# Patient Record
Sex: Male | Born: 1935 | Race: Black or African American | Hispanic: No | Smoking: Never smoker
Health system: Southern US, Community
[De-identification: ages and names within clinical notes are randomized; demographics above are authoritative.]

## PROBLEM LIST (undated history)

## (undated) DIAGNOSIS — K922 Gastrointestinal hemorrhage, unspecified: Secondary | ICD-10-CM

## (undated) DIAGNOSIS — I480 Paroxysmal atrial fibrillation: Secondary | ICD-10-CM

## (undated) DIAGNOSIS — I639 Cerebral infarction, unspecified: Secondary | ICD-10-CM

## (undated) DIAGNOSIS — I517 Cardiomegaly: Secondary | ICD-10-CM

## (undated) DIAGNOSIS — I3139 Other pericardial effusion (noninflammatory): Secondary | ICD-10-CM

## (undated) DIAGNOSIS — R011 Cardiac murmur, unspecified: Secondary | ICD-10-CM

## (undated) DIAGNOSIS — I509 Heart failure, unspecified: Secondary | ICD-10-CM

## (undated) DIAGNOSIS — Z8719 Personal history of other diseases of the digestive system: Secondary | ICD-10-CM

## (undated) DIAGNOSIS — I313 Pericardial effusion (noninflammatory): Secondary | ICD-10-CM

## (undated) DIAGNOSIS — M1990 Unspecified osteoarthritis, unspecified site: Secondary | ICD-10-CM

## (undated) DIAGNOSIS — Z8711 Personal history of peptic ulcer disease: Secondary | ICD-10-CM

## (undated) DIAGNOSIS — E7800 Pure hypercholesterolemia, unspecified: Secondary | ICD-10-CM

## (undated) DIAGNOSIS — I2510 Atherosclerotic heart disease of native coronary artery without angina pectoris: Secondary | ICD-10-CM

## (undated) DIAGNOSIS — I119 Hypertensive heart disease without heart failure: Secondary | ICD-10-CM

## (undated) DIAGNOSIS — I213 ST elevation (STEMI) myocardial infarction of unspecified site: Secondary | ICD-10-CM

## (undated) DIAGNOSIS — J189 Pneumonia, unspecified organism: Secondary | ICD-10-CM

## (undated) DIAGNOSIS — Z8739 Personal history of other diseases of the musculoskeletal system and connective tissue: Secondary | ICD-10-CM

## (undated) DIAGNOSIS — R630 Anorexia: Secondary | ICD-10-CM

## (undated) DIAGNOSIS — C61 Malignant neoplasm of prostate: Secondary | ICD-10-CM

## (undated) DIAGNOSIS — I998 Other disorder of circulatory system: Secondary | ICD-10-CM

## (undated) DIAGNOSIS — I10 Essential (primary) hypertension: Secondary | ICD-10-CM

## (undated) HISTORY — PX: PROSTATECTOMY: SHX69

## (undated) HISTORY — DX: Other pericardial effusion (noninflammatory): I31.39

## (undated) HISTORY — DX: Pericardial effusion (noninflammatory): I31.3

## (undated) HISTORY — DX: Hypertensive heart disease without heart failure: I11.9

## (undated) HISTORY — DX: Anorexia: R63.0

## (undated) HISTORY — DX: Cardiomegaly: I51.7

## (undated) HISTORY — PX: HERNIA REPAIR: SHX51

---

## 1998-03-06 ENCOUNTER — Other Ambulatory Visit: Admission: RE | Admit: 1998-03-06 | Discharge: 1998-03-06 | Payer: Self-pay | Admitting: Internal Medicine

## 1998-07-14 ENCOUNTER — Ambulatory Visit (HOSPITAL_COMMUNITY): Admission: RE | Admit: 1998-07-14 | Discharge: 1998-07-14 | Payer: Self-pay | Admitting: Internal Medicine

## 1998-09-18 ENCOUNTER — Emergency Department (HOSPITAL_COMMUNITY): Admission: EM | Admit: 1998-09-18 | Discharge: 1998-09-18 | Payer: Self-pay | Admitting: Emergency Medicine

## 1998-09-18 ENCOUNTER — Encounter: Payer: Self-pay | Admitting: Emergency Medicine

## 2001-02-08 ENCOUNTER — Encounter: Payer: Self-pay | Admitting: Internal Medicine

## 2001-02-08 ENCOUNTER — Ambulatory Visit (HOSPITAL_COMMUNITY): Admission: RE | Admit: 2001-02-08 | Discharge: 2001-02-08 | Payer: Self-pay | Admitting: Internal Medicine

## 2004-09-18 ENCOUNTER — Emergency Department (HOSPITAL_COMMUNITY): Admission: EM | Admit: 2004-09-18 | Discharge: 2004-09-18 | Payer: Self-pay | Admitting: Emergency Medicine

## 2005-11-05 ENCOUNTER — Ambulatory Visit (HOSPITAL_COMMUNITY): Admission: RE | Admit: 2005-11-05 | Discharge: 2005-11-05 | Payer: Self-pay | Admitting: Orthopedic Surgery

## 2005-12-15 ENCOUNTER — Encounter: Payer: Self-pay | Admitting: Internal Medicine

## 2006-03-30 ENCOUNTER — Observation Stay (HOSPITAL_COMMUNITY): Admission: EM | Admit: 2006-03-30 | Discharge: 2006-03-31 | Payer: Self-pay | Admitting: Internal Medicine

## 2006-03-30 ENCOUNTER — Emergency Department (HOSPITAL_COMMUNITY): Admission: EM | Admit: 2006-03-30 | Discharge: 2006-03-30 | Payer: Self-pay | Admitting: Emergency Medicine

## 2006-03-30 ENCOUNTER — Ambulatory Visit: Payer: Self-pay | Admitting: Internal Medicine

## 2006-03-31 ENCOUNTER — Ambulatory Visit: Payer: Self-pay

## 2006-04-07 ENCOUNTER — Ambulatory Visit: Payer: Self-pay | Admitting: Internal Medicine

## 2007-09-29 ENCOUNTER — Emergency Department (HOSPITAL_COMMUNITY): Admission: EM | Admit: 2007-09-29 | Discharge: 2007-09-29 | Payer: Self-pay | Admitting: Emergency Medicine

## 2007-10-01 ENCOUNTER — Inpatient Hospital Stay (HOSPITAL_COMMUNITY): Admission: EM | Admit: 2007-10-01 | Discharge: 2007-10-05 | Payer: Self-pay | Admitting: Emergency Medicine

## 2009-04-20 ENCOUNTER — Emergency Department (HOSPITAL_COMMUNITY): Admission: EM | Admit: 2009-04-20 | Discharge: 2009-04-20 | Payer: Self-pay | Admitting: Emergency Medicine

## 2009-09-03 ENCOUNTER — Ambulatory Visit: Payer: Self-pay | Admitting: Vascular Surgery

## 2009-09-03 ENCOUNTER — Ambulatory Visit (HOSPITAL_COMMUNITY): Admission: RE | Admit: 2009-09-03 | Discharge: 2009-09-03 | Payer: Self-pay | Admitting: Internal Medicine

## 2009-09-03 ENCOUNTER — Encounter: Payer: Self-pay | Admitting: Internal Medicine

## 2010-04-21 ENCOUNTER — Ambulatory Visit (HOSPITAL_COMMUNITY): Admission: RE | Admit: 2010-04-21 | Discharge: 2010-04-21 | Payer: Self-pay | Admitting: Internal Medicine

## 2010-11-20 LAB — CBC
HCT: 46.7 % (ref 39.0–52.0)
Hemoglobin: 15.1 g/dL (ref 13.0–17.0)
MCHC: 32.5 g/dL (ref 30.0–36.0)
MCV: 90.9 fL (ref 78.0–100.0)
RBC: 5.13 MIL/uL (ref 4.22–5.81)
WBC: 8.1 10*3/uL (ref 4.0–10.5)

## 2010-11-20 LAB — SEDIMENTATION RATE: Sed Rate: 3 mm/hr (ref 0–16)

## 2010-11-20 LAB — COMPREHENSIVE METABOLIC PANEL
ALT: 16 U/L (ref 0–53)
AST: 33 U/L (ref 0–37)
Alkaline Phosphatase: 61 U/L (ref 39–117)
CO2: 26 mEq/L (ref 19–32)
Calcium: 9.2 mg/dL (ref 8.4–10.5)
GFR calc Af Amer: >60 mL/min (ref >60)
GFR calc non Af Amer: >60 mL/min (ref >60)
Glucose, Bld: 104 mg/dL — ABNORMAL HIGH (ref 70–99)
Potassium: 3.6 mEq/L (ref 3.5–5.1)
Sodium: 137 mEq/L (ref 135–145)

## 2010-11-20 LAB — DIFFERENTIAL
Basophils Relative: 0 % (ref 0–1)
Eosinophils Absolute: 0.3 10*3/uL (ref 0.0–0.7)
Eosinophils Relative: 4 % (ref 0–5)
Lymphs Abs: 2.6 10*3/uL (ref 0.7–4.0)
Monocytes Relative: 8 % (ref 3–12)
Neutrophils Relative %: 55 % (ref 43–77)

## 2010-11-20 LAB — POCT CARDIAC MARKERS
CKMB, poc: 2.9 ng/mL (ref 1.0–8.0)
Troponin i, poc: <0.05 ng/mL (ref 0.00–0.09)

## 2010-11-20 LAB — PROTIME-INR
INR: 1 (ref 0.00–1.49)
Prothrombin Time: 13.4 seconds (ref 11.6–15.2)

## 2010-11-20 LAB — GLUCOSE, CAPILLARY: Glucose-Capillary: 106 mg/dL — ABNORMAL HIGH (ref 70–99)

## 2010-11-20 LAB — CK TOTAL AND CKMB (NOT AT ARMC): Total CK: 398 U/L — ABNORMAL HIGH (ref 7–232)

## 2010-12-05 DIAGNOSIS — E785 Hyperlipidemia, unspecified: Secondary | ICD-10-CM | POA: Insufficient documentation

## 2010-12-05 DIAGNOSIS — I10 Essential (primary) hypertension: Secondary | ICD-10-CM

## 2010-12-29 NOTE — Op Note (Signed)
NAME:  OVADIA, LOPP NO.:  [id number]   MEDICAL RECORD NO.:  [id number]          PATIENT TYPE:  INP   LOCATION:  5034                         FACILITY:  [facility]   PHYSICIAN:  Graylin Shiver, M.D.   DATE OF BIRTH:  07/30/1932   DATE OF PROCEDURE:  10/03/2007  DATE OF DISCHARGE:                               OPERATIVE REPORT   PROCEDURE:  Flexible sigmoidoscopy   INDICATIONS FOR PROCEDURE:  The patient is a 77 year old male with  complaints of rectal bleeding.  A colonoscopy was planned to evaluate.  Informed consent was obtained after explanation of the risks of  bleeding, infection, and perforation.   PREMEDICATION:  Fentanyl 100 mcg IV, Versed 9 mg IV.   DESCRIPTION OF PROCEDURE:  With the patient in the left lateral  decubitus position, a rectal exam was performed.  No masses were felt.  The Pentax pediatric colonoscope was inserted into the rectum, and  advanced into the sigmoid colon.  There were numerous diverticula in the  sigmoid colon.  One of the diverticula had a clot in it.  It was not  actively bleeding.  In the proximal sigmoid colon, the lumen got  narrowed, and I could not advance the scope.  I am not clear if this is  secondary to a proximal sigmoid stricture, or extensive diverticular  disease in this area, or tumor beyond this point. The scope was brought  out.  He tolerated the procedure well.   IMPRESSION:  1. Diverticulosis.  2. Small clot present in one of the diverticula.  3. Possible proximal sigmoid stricture.   PLAN:  Will proceed with barium enema to further investigate.           ______________________________  Graylin Shiver, M.D.     SFG/MEDQ  D:  10/03/2007  T:  03/10/2008  Job:  [id number]   cc:   Margaretmary Bayley, M.D.

## 2010-12-29 NOTE — Consult Note (Signed)
NAME:  Gregory [name], Gregory [name] NO.:  [id number]   MEDICAL RECORD NO.:  [id number]          PATIENT TYPE:  INP   LOCATION:  1610                         FACILITY:  [facility]   PHYSICIAN:  Graylin Shiver, M.D.   DATE OF BIRTH:  07/30/1932   DATE OF CONSULTATION:  10/02/2007  DATE OF DISCHARGE:                                 CONSULTATION   REFERRING PHYSICIAN:  Dr. Margaretmary Bayley.   REASON FOR CONSULTATION:  We were asked to see Mr. Krenz today in  consultation for rectal bleeding by Dr. Lindell Spar. Chestine Spore, October 02, 2007.   HISTORY OF PRESENT ILLNESS:  This is a 77 year old African American male  who experienced diarrhea and anorexia for 4 days.  He came to the ER on  February 13 and was treated symptomatically with fluids and Imodium AD  and then sent home.  He was feeling better on Saturday and then on  Sunday, February 15, awoke with loose black stools there were  accompanied by red blood in the toilet bowl; this happened to him twice  Sunday morning and he came back to [facility] Emergency Room.  He denies  any abdominal pain; however, he does say he has had some bloating and  some minor gas pain in his stomach now and then.  He gets rectal  irritation and some constipation which he attributes to likely  hemorrhoids, but he says he has had no bleeding previously.  He denies  taking iron or Pepto-Bismol.  He has had an antibiotic in the last 3-4  weeks--it was penicillin--but he cannot remember why.  He has had no  additional bowel movements since Sunday morning and denies having a  colonoscopy previously.   PAST MEDICAL HISTORY:  Significant for prostate cancer for which he has  had no radiation or chemotherapy, but he is status post prostate  resection.  He also has hypertension, hyperlipidemia and tells me that  he developed an infection with a clot in his left eye 2 weeks ago.  He  has had surgeries including 2 inguinal hernia repairs and his prostate  resection.   CURRENT MEDICATIONS:  Metoprolol/hydrochlorothiazide as well as  nifedipine.  He is also supposed to be on a daily aspirin for the clot  in his left eye, but states that he has not been taking it.   ALLERGIES:  He has no known drug allergies.   FAMILY HISTORY:  Really unknown, but he tells me that he knows of no  colon cancer.   SOCIAL HISTORY:  Negative for drugs, alcohol and tobacco.  This  gentleman has 4 living sons with 2 deceased sons.  He now lives here in  [place] alone.   REVIEW OF SYSTEMS:  Positive for left lower back pain that at times  radiates to underneath his right rib cage.  He also has epistaxis on the  right secondary to a recent cold.   PHYSICAL EXAM:  GENERAL:  He is alert and oriented, in no apparent  distress.  VITAL SIGNS:  Temperature is 98.6, pulse 79, respirations 20 and blood  pressure is 125/76.  CARDIOVASCULAR:  Regular rate and rhythm, but he does have a systolic  murmur that is approximately 3/6 heard best at left sternal border.  LUNGS:  Clear to auscultation bilaterally.  ABDOMEN:  Soft, nontender and nondistended with good bowel sounds.   LABORATORY DATA:  Recent labs show a hemoglobin of 13.2, hematocrit  40.2, white count 5.9, platelets 160,000.  BMET is significant for a  potassium of 2.9.  His coagulations are normal.  His LFTs demonstrate an  elevated total bilirubin at 1.9.  His CEA is pending, as is TSH and PSH.   ASSESSMENT:  Dr. Wandalee Ferdinand has seen and examined the patient and  collected a history.  His impression is that this is a 77 year old male  who had diarrhea starting last week that turned into rectal bleeding.  He is now stable with a relatively normal hemoglobin.  He does have a  history of recent antibiotic use as well as prostate cancer.   PLAN:  Check C. difficile and stool cultures, replete potassium, plan  for colonoscopy plus/minus endoscopy on September 23, 2007.   Thanks very much for this  consultation.      Stephani Police, PA    ______________________________  Graylin Shiver, M.D.    MLY/MEDQ  D:  10/02/2007  T:  10/03/2007  Job:  [id number]   cc:   Margaretmary Bayley, M.D.  Graylin Shiver, M.D.

## 2011-01-01 NOTE — Discharge Summary (Signed)
NAME:  Gregory [name], Gregory [name] NO.:  [id number]   MEDICAL RECORD NO.:  [id number]          PATIENT TYPE:  INP   LOCATION:  5034                         FACILITY:  [facility]   PHYSICIAN:  Margaretmary Bayley, M.D.    DATE OF BIRTH:  07/30/1932   DATE OF ADMISSION:  10/01/2007  DATE OF DISCHARGE:  10/05/2007                               DISCHARGE SUMMARY   DISCHARGE DIAGNOSES:  1. Diverticulosis of the colon with hemorrhage.  2. Intestinal dysfunction.  3. Systemic hypertension.  4. Hypercholesterolemia.  5. History of carcinoma of the prostate.   REASON FOR ADMISSION:  [name] is a 77 year old African American  gentleman who came into the emergency room with a chief complaint of a  sudden onset of rectal bleeding described as dark stools with some  maroon-colored stools mixed in.  The patient states that he has some  crampy abdominal discomfort, but no frank pain.  The pain did appear to  be alleviated with a bowel movement.  He has no previous history of  rectal bleeding.  He has not had any chronic use of nonsteroidal anti-  inflammatory agents or alcohol.  He has had dyspepsia and has been  treated with Nexium and Prevacid in the past.   PERTINENT PHYSICAL FINDINGS:  GENERAL:  He is a well-developed, well-  nourished gentleman appearing somewhat anxious, but in no acute  distress.  VITAL SIGNS:  His blood pressure lying is 122/84, pulse rate of 88,  sitting with his legs dangling 118/82 with a pulse rate of 96,  respiratory rate of 20, and temperature is 97.8.  HEENT:  There is no scleral icterus, no conjunctival pallor.  NECK:  Supple.  CHEST:  No splinting tenderness.  LUNGS:  Clear.  CARDIAC EXAM:  His precordium is adynamic with normal heart sounds.  No  discernable murmurs, rubs or gallops.  ABDOMEN:  Protuberant, soft, and no focal tenderness.  No organomegaly  and no masses.  RECTAL EXAM:  He has good sphincter tone.  He has dark maroon-colored  stool in the  rectal vault.  No obvious masses were noted.  EXTREMITIES:  No clubbing, cyanosis, or edema.  NEUROLOGIC:  He is alert, oriented, and cooperative.  No focal, sensory,  or motor reflex deficit.   LAB DATA:  His comprehensive metabolic panel was completely normal with  a borderline low potassium of 3.5.  His PTT is 30 and INR 1.1.  White  count of 6700, hemoglobin 13.6, and hematocrit 42.  Urinalysis is normal  with negative nitrites, negative leukocyte esterase.  His stool is  positive for blood.  His admission EKG revealed normal sinus rhythm with  marked sinus arrhythmia, a first-degree AV block.  He has QS complexes  in V1 and V2, but no acute ST-T wave changes.  Acute abdominal series  revealed some peribronchial cuffing in the lungs but with no  infiltrates, no edema.  Heart size is normal.  Abdominal film revealed a  nonobstructive gas pattern.  The multiple surgical clips within the  pelvis was felt to be related to his previous prostate resection.  He  has diffuse degenerative changes of the lumbar spine but otherwise  unremarkable.   HOSPITAL COURSE:  [name] is admitted to a telemetry bed with the  acute onset of rectal bleeding, which for the most part is without pain.  He has no previous history of GI bleeding.  __________ appear to  contribute to bleeding tendencies.  He was seen in consultation on  second hospital day by GI medicine who felt that the patient was  hemodynamically stable and a routine endoscopic evaluation was ordered.   LABORATORY DATA:  Serial CBCs revealed no significant drop in his  hematocrit and hemoglobin; however, he did have a fall in his potassium  to 2.9 and was started on potassium supplements for this.   The patient was scheduled for a colonoscopy.  He was then taken to the  endoscopy suite where the procedure was limited in that there was  evidence of extensive diverticulosis of the colon and mechanical factors  as well as blood  prevented a full visualization of the entire colon.  It  was the opinion of the gastroenterologist that the patient's bleeding  was most likely coming from the extensive diverticulosis noted.  Because  of full visualization of the colon could not be achieved, the patient  underwent a barium enema to exclude more serious obstructive changes.  The colon procedure was done with a high-density contrast medium.  The  entire colon was filled with barium with reflux into what appeared to be  a normal-appearing terminal ileum and a normal-appearing appendix.  Diverticulosis was noted involving the colon without any evidence of  diverticulitis or mass lesion.  There were some deformities noted within  the colon, which is possibly related to previous fibrotic changes  related to past episodes of diverticulitis.  The patient requires no  blood transfusion during his hospitalization.  A repeat potassium level  was found to be 4.  No other complications were noted.  A CEA was  obtained.  The PSA was __________.  He had normal thyroid function  studies.  The patient is subsequently discharged in a condition that  significantly improved.   MEDICATIONS:  1. Crestor 5 mg daily for his elevated cholesterol.  2. Lopressor HCT 100/25 one daily.  3. Nifedipine 60 mg once daily.  4. K-Dur 20 mEq daily.  The patient was asked to hold any aspirin treatment for at least 2 weeks  until reevaluated by Dr. Evette Cristal, his gastroenterologist, in 2 weeks.      Margaretmary Bayley, M.D.  Electronically Signed     PC/MEDQ  D:  11/16/2007  T:  11/17/2007  Job:  [id number]

## 2011-05-07 LAB — CBC
HCT: 40.2
Hemoglobin: 13.2
Hemoglobin: 13.6
MCHC: 32.3
MCHC: 32.4
MCHC: 32.8
MCV: 86.3
MCV: 85.8
Platelets: 187
Platelets: 156
RBC: 4.65
RBC: 4.89
RDW: 14.2
RDW: 14
RDW: 14.1
WBC: 5.9
WBC: 6.7

## 2011-05-07 LAB — COMPREHENSIVE METABOLIC PANEL
ALT: 14
AST: 23
AST: 20
AST: 22
Albumin: 3.7
CO2: 24
CO2: 27
CO2: 27
Calcium: 9
Calcium: 8.6
Calcium: 8.7
Chloride: 100
Creatinine, Ser: 1.13
Creatinine, Ser: 0.97
GFR calc Af Amer: >60
GFR calc Af Amer: >60
GFR calc Af Amer: >60
GFR calc non Af Amer: >60
GFR calc non Af Amer: >60
GFR calc non Af Amer: >60
Glucose, Bld: 87
Sodium: 135
Sodium: 134 — ABNORMAL LOW
Sodium: 138
Total Bilirubin: 1.4 — ABNORMAL HIGH
Total Protein: 7.1
Total Protein: 6.4

## 2011-05-07 LAB — DIFFERENTIAL
Basophils Absolute: 0
Basophils Relative: 0
Eosinophils Absolute: 0.2
Eosinophils Relative: 3
Eosinophils Relative: 3
Lymphocytes Relative: 31
Lymphs Abs: 2
Lymphs Abs: 2.1
Monocytes Relative: 17 — ABNORMAL HIGH
Neutrophils Relative %: 53

## 2011-05-07 LAB — URINALYSIS, ROUTINE W REFLEX MICROSCOPIC
Bilirubin Urine: NEGATIVE
Hgb urine dipstick: NEGATIVE
Hgb urine dipstick: NEGATIVE
Nitrite: NEGATIVE
Nitrite: NEGATIVE
Protein, ur: NEGATIVE
Specific Gravity, Urine: 1.016
Urobilinogen, UA: 0.2
pH: 5.5

## 2011-05-07 LAB — FECAL LACTOFERRIN, QUANT

## 2011-05-07 LAB — PROTIME-INR
INR: 1.1
Prothrombin Time: 14.1

## 2011-05-07 LAB — LIPID PANEL
Cholesterol: 107
Total CHOL/HDL Ratio: 3.5
VLDL: 11

## 2011-05-07 LAB — T4: T4, Total: 9.2

## 2011-05-07 LAB — CK ISOENZYMES
CK-MB: 0 % (ref <5)
CK-MM: 100 % (ref 95–100)

## 2011-05-07 LAB — HEMOGLOBIN AND HEMATOCRIT, BLOOD: HCT: 44.2

## 2011-05-07 LAB — BASIC METABOLIC PANEL
BUN: 6
CO2: 27
Calcium: 8.5
Creatinine, Ser: 0.93
Glucose, Bld: 91

## 2011-05-07 LAB — PSA: PSA: <0.01 — ABNORMAL LOW

## 2011-05-07 LAB — T3: T3, Total: 142.1 (ref 80.0–204.0)

## 2011-05-07 LAB — LIPASE, BLOOD: Lipase: 39

## 2011-05-07 LAB — STOOL CULTURE

## 2011-05-07 LAB — ABO/RH: ABO/RH(D): O POS

## 2011-05-07 LAB — FREE PSA: PSA, Free: <0.1

## 2011-05-07 LAB — GIARDIA/CRYPTOSPORIDIUM SCREEN(EIA): Giardia Screen - EIA: NEGATIVE

## 2012-08-22 ENCOUNTER — Other Ambulatory Visit: Payer: Self-pay | Admitting: Gastroenterology

## 2012-08-22 DIAGNOSIS — K625 Hemorrhage of anus and rectum: Secondary | ICD-10-CM

## 2012-08-28 ENCOUNTER — Ambulatory Visit
Admission: RE | Admit: 2012-08-28 | Discharge: 2012-08-28 | Disposition: A | Discharge status: Home / Self Care | Payer: PRIVATE HEALTH INSURANCE | Source: Ambulatory Visit | Attending: Gastroenterology | Admitting: Gastroenterology

## 2012-08-28 ENCOUNTER — Other Ambulatory Visit: Payer: Self-pay | Admitting: Gastroenterology

## 2012-08-28 DIAGNOSIS — K625 Hemorrhage of anus and rectum: Secondary | ICD-10-CM

## 2013-04-05 ENCOUNTER — Ambulatory Visit (HOSPITAL_COMMUNITY)
Admission: RE | Admit: 2013-04-05 | Discharge: 2013-04-05 | Disposition: A | Discharge status: Home / Self Care | Payer: PRIVATE HEALTH INSURANCE | Source: Ambulatory Visit | Attending: Internal Medicine | Admitting: Internal Medicine

## 2013-04-05 ENCOUNTER — Other Ambulatory Visit: Payer: Self-pay | Admitting: Internal Medicine

## 2013-04-05 DIAGNOSIS — R52 Pain, unspecified: Secondary | ICD-10-CM

## 2013-04-05 DIAGNOSIS — M11849 Other specified crystal arthropathies, unspecified hand: Secondary | ICD-10-CM | POA: Insufficient documentation

## 2013-04-05 DIAGNOSIS — M159 Polyosteoarthritis, unspecified: Secondary | ICD-10-CM | POA: Insufficient documentation

## 2013-04-05 DIAGNOSIS — M7989 Other specified soft tissue disorders: Secondary | ICD-10-CM | POA: Insufficient documentation

## 2013-04-05 DIAGNOSIS — M11239 Other chondrocalcinosis, unspecified wrist: Secondary | ICD-10-CM | POA: Insufficient documentation

## 2013-04-05 DIAGNOSIS — M79609 Pain in unspecified limb: Secondary | ICD-10-CM | POA: Insufficient documentation

## 2014-01-30 ENCOUNTER — Inpatient Hospital Stay (HOSPITAL_COMMUNITY): Payer: PRIVATE HEALTH INSURANCE

## 2014-01-30 ENCOUNTER — Inpatient Hospital Stay (HOSPITAL_COMMUNITY)
Admission: EM | Admit: 2014-01-30 | Discharge: 2014-02-01 | DRG: 056 | Disposition: A | Discharge status: Home / Self Care | Payer: PRIVATE HEALTH INSURANCE | Attending: Internal Medicine | Admitting: Internal Medicine

## 2014-01-30 ENCOUNTER — Ambulatory Visit (HOSPITAL_COMMUNITY): Payer: PRIVATE HEALTH INSURANCE

## 2014-01-30 ENCOUNTER — Emergency Department (HOSPITAL_COMMUNITY): Payer: PRIVATE HEALTH INSURANCE

## 2014-01-30 ENCOUNTER — Other Ambulatory Visit: Payer: Self-pay | Admitting: Internal Medicine

## 2014-01-30 ENCOUNTER — Encounter (HOSPITAL_COMMUNITY): Payer: Self-pay | Admitting: Emergency Medicine

## 2014-01-30 DIAGNOSIS — I10 Essential (primary) hypertension: Secondary | ICD-10-CM | POA: Diagnosis present

## 2014-01-30 DIAGNOSIS — Z7982 Long term (current) use of aspirin: Secondary | ICD-10-CM

## 2014-01-30 DIAGNOSIS — M129 Arthropathy, unspecified: Secondary | ICD-10-CM | POA: Diagnosis present

## 2014-01-30 DIAGNOSIS — I6389 Other cerebral infarction: Secondary | ICD-10-CM

## 2014-01-30 DIAGNOSIS — G8190 Hemiplegia, unspecified affecting unspecified side: Principal | ICD-10-CM | POA: Diagnosis present

## 2014-01-30 DIAGNOSIS — Z882 Allergy status to sulfonamides status: Secondary | ICD-10-CM

## 2014-01-30 DIAGNOSIS — R531 Weakness: Secondary | ICD-10-CM

## 2014-01-30 DIAGNOSIS — H5440 Blindness, one eye, unspecified eye: Secondary | ICD-10-CM | POA: Diagnosis present

## 2014-01-30 DIAGNOSIS — I6359 Cerebral infarction due to unspecified occlusion or stenosis of other cerebral artery: Secondary | ICD-10-CM

## 2014-01-30 DIAGNOSIS — R011 Cardiac murmur, unspecified: Secondary | ICD-10-CM | POA: Diagnosis present

## 2014-01-30 DIAGNOSIS — I672 Cerebral atherosclerosis: Secondary | ICD-10-CM | POA: Diagnosis present

## 2014-01-30 DIAGNOSIS — I6329 Cerebral infarction due to unspecified occlusion or stenosis of other precerebral arteries: Secondary | ICD-10-CM

## 2014-01-30 DIAGNOSIS — I639 Cerebral infarction, unspecified: Secondary | ICD-10-CM | POA: Diagnosis present

## 2014-01-30 DIAGNOSIS — I6330 Cerebral infarction due to thrombosis of unspecified cerebral artery: Secondary | ICD-10-CM | POA: Diagnosis present

## 2014-01-30 DIAGNOSIS — R29898 Other symptoms and signs involving the musculoskeletal system: Secondary | ICD-10-CM

## 2014-01-30 DIAGNOSIS — E785 Hyperlipidemia, unspecified: Secondary | ICD-10-CM | POA: Diagnosis present

## 2014-01-30 DIAGNOSIS — Z888 Allergy status to other drugs, medicaments and biological substances status: Secondary | ICD-10-CM

## 2014-01-30 HISTORY — DX: Cardiac murmur, unspecified: R01.1

## 2014-01-30 HISTORY — DX: Unspecified osteoarthritis, unspecified site: M19.90

## 2014-01-30 HISTORY — DX: Pure hypercholesterolemia, unspecified: E78.00

## 2014-01-30 HISTORY — DX: Essential (primary) hypertension: I10

## 2014-01-30 LAB — COMPREHENSIVE METABOLIC PANEL
ALT: 14 U/L (ref 0–53)
AST: 31 U/L (ref 0–37)
Albumin: 4 g/dL (ref 3.5–5.2)
Alkaline Phosphatase: 84 U/L (ref 39–117)
BILIRUBIN TOTAL: 2.1 mg/dL — AB (ref 0.3–1.2)
BUN: 16 mg/dL (ref 6–23)
CALCIUM: 9.6 mg/dL (ref 8.4–10.5)
CHLORIDE: 106 meq/L (ref 96–112)
CO2: 21 meq/L (ref 19–32)
CREATININE: 0.88 mg/dL (ref 0.50–1.35)
GFR calc Af Amer: >90 mL/min (ref >90)
GFR, EST NON AFRICAN AMERICAN: 80 mL/min — AB (ref >90)
Glucose, Bld: 93 mg/dL (ref 70–99)
Potassium: 4.4 mEq/L (ref 3.7–5.3)
Sodium: 142 mEq/L (ref 137–147)
Total Protein: 7.2 g/dL (ref 6.0–8.3)

## 2014-01-30 LAB — URINALYSIS, ROUTINE W REFLEX MICROSCOPIC
Glucose, UA: NEGATIVE mg/dL
HGB URINE DIPSTICK: NEGATIVE
Ketones, ur: NEGATIVE mg/dL
LEUKOCYTES UA: NEGATIVE
NITRITE: NEGATIVE
PROTEIN: NEGATIVE mg/dL
SPECIFIC GRAVITY, URINE: 1.024 (ref 1.005–1.030)
UROBILINOGEN UA: 4 mg/dL — AB (ref 0.0–1.0)
pH: 6.5 (ref 5.0–8.0)

## 2014-01-30 LAB — CBC WITH DIFFERENTIAL/PLATELET
BASOS ABS: 0 10*3/uL (ref 0.0–0.1)
BASOS PCT: 0 % (ref 0–1)
EOS PCT: 2 % (ref 0–5)
Eosinophils Absolute: 0.2 10*3/uL (ref 0.0–0.7)
HEMATOCRIT: 43 % (ref 39.0–52.0)
HEMOGLOBIN: 14.5 g/dL (ref 13.0–17.0)
LYMPHS PCT: 31 % (ref 12–46)
Lymphs Abs: 2.2 10*3/uL (ref 0.7–4.0)
MCH: 29.2 pg (ref 26.0–34.0)
MCHC: 33.7 g/dL (ref 30.0–36.0)
MCV: 86.7 fL (ref 78.0–100.0)
MONO ABS: 0.5 10*3/uL (ref 0.1–1.0)
MONOS PCT: 7 % (ref 3–12)
NEUTROS ABS: 4.2 10*3/uL (ref 1.7–7.7)
Neutrophils Relative %: 60 % (ref 43–77)
Platelets: 158 10*3/uL (ref 150–400)
RBC: 4.96 MIL/uL (ref 4.22–5.81)
RDW: 14.6 % (ref 11.5–15.5)
WBC: 7.1 10*3/uL (ref 4.0–10.5)

## 2014-01-30 MED ORDER — ASPIRIN EC 81 MG PO TBEC
81.0000 mg | DELAYED_RELEASE_TABLET | Freq: Every day | ORAL | Status: DC
  Administered 2014-01-30 – 2014-01-31 (×2): 81 mg via ORAL
  Filled 2014-01-30 (×2): qty 1

## 2014-01-30 MED ORDER — SODIUM CHLORIDE 0.9 % IV SOLN
INTRAVENOUS | Status: AC
  Administered 2014-01-30: 1000 mL via INTRAVENOUS

## 2014-01-30 MED ORDER — SENNOSIDES-DOCUSATE SODIUM 8.6-50 MG PO TABS: 1.0000 | ORAL_TABLET | Freq: Every evening | ORAL | Status: DC | PRN

## 2014-01-30 MED ORDER — ATORVASTATIN CALCIUM 10 MG PO TABS
10.0000 mg | ORAL_TABLET | Freq: Every day | ORAL | Status: DC
Start: 2014-01-31 — End: 2014-02-01
  Administered 2014-01-31: 10 mg via ORAL
  Filled 2014-01-30 (×2): qty 1

## 2014-01-30 MED ORDER — AMLODIPINE BESYLATE 5 MG PO TABS
5.0000 mg | ORAL_TABLET | Freq: Every day | ORAL | Status: DC
  Administered 2014-01-31 – 2014-02-01 (×2): 5 mg via ORAL
  Filled 2014-01-30 (×2): qty 1

## 2014-01-30 NOTE — H&P (Signed)
[facility] History and Physical  LLOYDE LUDLAM WUJ:[id number] DOB: 01/22/1930 DOA: 01/30/2014  Referring physician: ER physician PCP: [name], MD   Chief Complaint: right arm weakness  HPI:  80 year old male with past medical history of hypertension, dyslipidemia who presented to [facility] ED 01/30/2014 with complaints of ongoing right arm weakness for past 2 days prior to this admission with some gait imbalance but no associated falls or loss of consciousness. No reports of diaphoresis, no chest pain, shortness of breath or palpitations. He was seen by his PCP earlier and was referred to ED for further evaluation.No reports of slurred speech. No reports of abdominal pain, nausea or vomiting. No reports of blood in stool. No fevers. No GU complaints.   In ED, vitals are stable. BP is 131/67, HR 69, T max 98.2 F and oxygen saturation is 96% on room air. His blood work is unremarkable. MRI brain showed acute infarct in the deep white matter of the left cerebral hemisphere. ED has consulted neurology.   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 - stroke order set in place - order for aspirin place; no difficulty swallowing - order place to continue statin therapy - follow up on 2 D ECHO and carotid doppler - will see with neurology if CT head is required since pt already had MRI brain with sings of acute infarct  - stroke team consulted - follow up on lipid panel, TSH, A1c - PT/OT evalaution Active Problems:   Hypertension - continue Norvasc    Hyperlipidemia - continue Crestor   DVT prophylaxis: SCD's bilaterally + aspirin   Radiological Exams on Admission: Mr Virgel Paling Wo Contrast 01/30/2014    IMPRESSION: Acute infarcts in the deep white matter of the left cerebral hemisphere.  Mild intracranial atherosclerotic disease. There is a  moderately severe stenosis of the right cavernous carotid and mild irregularity in the left cavernous carotid.     Mr Brain Wo Contrast 01/30/2014    IMPRESSION: Acute infarcts in the deep white matter of the left cerebral hemisphere.  Mild intracranial atherosclerotic disease. 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     Code Status: Full Family Communication: Plan of care discussed with the patient  Disposition Plan: Admit for further evaluation  Leisa Lenz, MD  [facility] Pager [phone]  Review of Systems:  Constitutional: Negative for fever, chills and malaise/fatigue. Negative for diaphoresis.  HENT: Negative for hearing loss, ear pain, nosebleeds, congestion, sore throat, neck pain   Eyes: Negative for blurred vision, double vision, photophobia, pain, discharge and redness.  Respiratory: Negative for cough, hemoptysis, sputum production, shortness of breath, wheezing and stridor.   Cardiovascular: Negative for chest pain, palpitations, orthopnea, claudication and leg swelling.  Gastrointestinal: Negative for nausea, vomiting and abdominal pain. Negative for heartburn, constipation, blood in stool and melena.  Genitourinary: Negative for dysuria, urgency, frequency, hematuria and flank pain.  Musculoskeletal: Negative for myalgias, back pain, joint pain and falls.  Skin: Negative for itching and rash.  Neurological: positive for right arm weakness Endo/Heme/Allergies: Negative for environmental allergies and polydipsia. Does not bruise/bleed easily.  Psychiatric/Behavioral: Negative for suicidal ideas. The patient is not nervous/anxious.      Past Medical History  Diagnosis Date  . Hypertension   . Hypercholesteremia   .  Murmur, cardiac   . Arthritis    No past surgical history on file. Social History:  has no tobacco, alcohol, and drug history on file.  Allergies  Allergen Reactions   . Sulfa Antibiotics   . Ramipril     Family History: Heart disease in family   Prior to Admission medications   Medication Sig Start Date End Date Taking? Authorizing Provider  amLODipine (NORVASC) 5 MG tablet Take 5 mg by mouth daily.   Yes Historical Provider, MD  aspirin EC 81 MG tablet Take 81 mg by mouth daily.   Yes Historical Provider, MD  Aspirin-Caffeine (BAYER BACK & BODY PAIN EX ST) 500-32.5 MG TABS Take 1-2 tablets by mouth every 6 (six) hours as needed (pain).   Yes Historical Provider, MD  PRESCRIPTION MEDICATION Place 1 drop into both eyes at bedtime.   Yes Historical Provider, MD  rosuvastatin (CRESTOR) 5 MG tablet Take 5 mg by mouth at bedtime.   Yes Historical Provider, MD   Physical Exam: Filed Vitals:   01/30/14 1351  BP: 134/71  Pulse: 84  Temp: 98.2 F (36.8 C)  TempSrc: Oral  Resp: 22  SpO2: 98%    Physical Exam  Constitutional: Appears well-developed and well-nourished. No distress.  HENT: Normocephalic. No tonsillar erythema or exudates Eyes: Conjunctivae and EOM are normal. PERRLA, no scleral icterus.  Neck: Normal ROM. Neck supple. No JVD. No tracheal deviation. No thyromegaly.  CVS: RRR, S1/S2 +, no murmurs, no gallops, no carotid bruit.  Pulmonary: Effort and breath sounds normal, no stridor, rhonchi, wheezes, rales.  Abdominal: Soft. BS +,  no distension, tenderness, rebound or guarding.  Musculoskeletal: Normal range of motion. No edema and no tenderness.  Lymphadenopathy: No lymphadenopathy noted, cervical, inguinal. Neuro: Alert. UE strength equal bilaterally, sensation intact  Skin: Skin is warm and dry. No rash noted. Not diaphoretic. No erythema. No pallor.  Psychiatric: Normal mood and affect. Behavior, judgment, thought content normal.   Labs on Admission:  Basic Metabolic Panel:  Recent Labs Lab 01/30/14 1359  NA 142  K 4.4  CL 106  CO2 21  GLUCOSE 93  BUN 16  CREATININE 0.88  CALCIUM 9.6   Liver Function Tests:  Recent  Labs Lab 01/30/14 1359  AST 31  ALT 14  ALKPHOS 84  BILITOT 2.1*  PROT 7.2  ALBUMIN 4.0   No results found for this basename: LIPASE, AMYLASE,  in the last 168 hours No results found for this basename: AMMONIA,  in the last 168 hours CBC:  Recent Labs Lab 01/30/14 1359  WBC 7.1  NEUTROABS 4.2  HGB 14.5  HCT 43.0  MCV 86.7  PLT 158   Cardiac Enzymes: No results found for this basename: CKTOTAL, CKMB, CKMBINDEX, TROPONINI,  in the last 168 hours BNP: No components found with this basename: POCBNP,  CBG: No results found for this basename: GLUCAP,  in the last 168 hours  If 7PM-7AM, please contact night-coverage [email or web address] Password [facility] 01/30/2014, 7:57 PM

## 2014-01-30 NOTE — Consult Note (Signed)
Referring Physician: Charlies Silvers    Chief Complaint: Right arm weakness  HPI: Gregory [name] is an 80 y.o. male who reports awakening on Monday and noting that he was unable to use his right arm correctly.  He noted that if he was trying to reach for something he would overshoot or undershoot his reach. He also felt that his gait was a little off balance.  He did not note any weakness in the lower extremities or dizziness.  Symptoms continued until today when he called his physician who recommended that he be seen at the hospital.    Date last known well: Date: 01/27/2014 Time last known well: Time: 23:00 tPA Given: No: Outside time window  Past Medical History  Diagnosis Date  . Hypertension   . Hypercholesteremia   . Murmur, cardiac   . Arthritis     History reviewed. No pertinent past surgical history.  Family history: Both parents are deceased. His died of unknown causes.  His father died of cancer-type unknown.  Social History:  Patient reports no history of alcohol, tobacco, or illicit drug abuse.    Allergies:  Allergies  Allergen Reactions  . Sulfa Antibiotics   . Ramipril     Medications:  I have reviewed the patient's current medications. Prior to Admission:  Prescriptions prior to admission  Medication Sig Dispense Refill  . amLODipine (NORVASC) 5 MG tablet Take 5 mg by mouth daily.      Marland Kitchen aspirin EC 81 MG tablet Take 81 mg by mouth daily.      . Aspirin-Caffeine (BAYER BACK & BODY PAIN EX ST) 500-32.5 MG TABS Take 1-2 tablets by mouth every 6 (six) hours as needed (pain).      Marland Kitchen PRESCRIPTION MEDICATION Place 1 drop into both eyes at bedtime.      . rosuvastatin (CRESTOR) 5 MG tablet Take 5 mg by mouth at bedtime.       Scheduled: . [START ON 01/31/2014] amLODipine  5 mg Oral Daily  . aspirin EC  81 mg Oral Daily  . [START ON 01/31/2014] atorvastatin  10 mg Oral q1800    ROS: History obtained from the patient  General ROS: negative for - chills, fatigue, fever,  night sweats, weight gain or weight loss Psychological ROS: negative for - behavioral disorder, hallucinations, memory difficulties, mood swings or suicidal ideation Ophthalmic ROS: loss of vision in the left eye ENT ROS: negative for - epistaxis, nasal discharge, oral lesions, sore throat, tinnitus or vertigo Allergy and Immunology ROS: negative for - hives or itchy/watery eyes Hematological and Lymphatic ROS: negative for - bleeding problems, bruising or swollen lymph nodes Endocrine ROS: negative for - galactorrhea, hair pattern changes, polydipsia/polyuria or temperature intolerance Respiratory ROS: negative for - cough, hemoptysis, shortness of breath or wheezing Cardiovascular ROS: swelling of the right lower extremity Gastrointestinal ROS: negative for - abdominal pain, diarrhea, hematemesis, nausea/vomiting or stool incontinence Genito-Urinary ROS: negative for - dysuria, hematuria, incontinence or urinary frequency/urgency Musculoskeletal ROS: right leg pain Neurological ROS: as noted in HPI Dermatological ROS: negative for rash and skin lesion changes  Physical Examination: Blood pressure 148/88, pulse 77, temperature 98.7 F (37.1 C), temperature source Oral, resp. rate 20, height 5\' 11"  (1.803 m), weight 83.915 kg (185 lb), SpO2 99.00%.  Neurologic Examination: Mental Status: Alert, oriented, thought content appropriate.  Speech fluent without evidence of aphasia.  Able to follow 3 step commands without difficulty. Cranial Nerves: II: Discs flat bilaterally; Visual fields grossly normal in the right eye,  pupils equal, round, reactive to light and accommodation III,IV, VI: ptosis not present, extra-ocular motions intact bilaterally V,VII: smile symmetric, facial light touch sensation normal bilaterally VIII: hearing normal bilaterally IX,X: gag reflex present XI: bilateral shoulder shrug XII: midline tongue extension Motor: Right : Upper extremity   5/5 with pronator  drift   Left:     Upper extremity   5/5  Lower extremity   5/5       Lower extremity   5/5 Tone and bulk:normal tone throughout; no atrophy noted Sensory: Pinprick and light touch intact throughout, bilaterally Deep Tendon Reflexes: 2+ and symmetric with absent AJ's bilaterally Plantars: Right: mute   Left: mute Cerebellar: normal finger-to-nose and normal heel-to-shin test Gait: Unsafe to test CV: pulses palpable throughout     Laboratory Studies:  Basic Metabolic Panel:  Recent Labs Lab 01/30/14 1359  NA 142  K 4.4  CL 106  CO2 21  GLUCOSE 93  BUN 16  CREATININE 0.88  CALCIUM 9.6    Liver Function Tests:  Recent Labs Lab 01/30/14 1359  AST 31  ALT 14  ALKPHOS 84  BILITOT 2.1*  PROT 7.2  ALBUMIN 4.0   No results found for this basename: LIPASE, AMYLASE,  in the last 168 hours No results found for this basename: AMMONIA,  in the last 168 hours  CBC:  Recent Labs Lab 01/30/14 1359  WBC 7.1  NEUTROABS 4.2  HGB 14.5  HCT 43.0  MCV 86.7  PLT 158    Cardiac Enzymes: No results found for this basename: CKTOTAL, CKMB, CKMBINDEX, TROPONINI,  in the last 168 hours  BNP: No components found with this basename: POCBNP,   CBG: No results found for this basename: GLUCAP,  in the last 168 hours  Microbiology: Results for orders placed during the hospital encounter of 10/01/07  STOOL CULTURE     Status: None   Collection Time    10/02/07  3:19 PM      Result Value Ref Range Status   Specimen Description STOOL   Final   Special Requests NONE   Final   Culture     Final   Value: NO SALMONELLA, SHIGELLA, CAMPYLOBACTER, OR YERSINIA ISOLATED   Report Status 10/05/2007 FINAL   Final  CLOSTRIDIUM DIFFICILE EIA     Status: None   Collection Time    10/02/07  3:19 PM      Result Value Ref Range Status   Specimen Description STOOL   Final   Special Requests NONE   Final   C difficile Toxins A+B, EIA NEGATIVE   Final   Report Status 10/03/2007 FINAL   Final     Coagulation Studies: No results found for this basename: LABPROT, INR,  in the last 72 hours  Urinalysis:  Recent Labs Lab 01/30/14 1402  COLORURINE YELLOW  LABSPEC 1.024  PHURINE 6.5  GLUCOSEU NEGATIVE  HGBUR NEGATIVE  BILIRUBINUR SMALL*  KETONESUR NEGATIVE  PROTEINUR NEGATIVE  UROBILINOGEN 4.0*  NITRITE NEGATIVE  LEUKOCYTESUR NEGATIVE    Lipid Panel:    Component Value Date/Time   CHOL  Value: 107        ATP III CLASSIFICATION:  <200     mg/dL   Desirable  200-239  mg/dL   Borderline High  >=240    mg/dL   High 10/02/2007 0447   TRIG 55 10/02/2007 0447   HDL 31* 10/02/2007 0447   CHOLHDL 3.5 10/02/2007 0447   VLDL 11 10/02/2007 0447   LDLCALC  Value:  65        Total Cholesterol/HDL:CHD Risk Coronary Heart Disease Risk Table                     Men   Women  1/2 Average Risk   3.4   3.3 10/02/2007 0447    HgbA1C:  No results found for this basename: HGBA1C    Urine Drug Screen:   No results found for this basename: labopia, cocainscrnur, labbenz, amphetmu, thcu, labbarb    Alcohol Level: No results found for this basename: ETH,  in the last 168 hours  Other results: EKG: sinus rhythm with 1st degree AV blockpressn at 80 bpm.  Imaging: Mr Virgel Paling Wo Contrast  01/30/2014   CLINICAL DATA:  Right hand numbness and weakness  EXAM: MRI HEAD WITHOUT CONTRAST  MRA HEAD WITHOUT CONTRAST  TECHNIQUE: Multiplanar, multiecho pulse sequences of the brain and surrounding structures were obtained without intravenous contrast. Angiographic images of the head were obtained using MRA technique without contrast.  COMPARISON:  MRI 04/20/2009  FINDINGS: MRI HEAD FINDINGS  Acute infarct involving the posterior external capsule on the left extending into the centrum semiovale. No other acute infarct.  Chronic microvascular ischemic changes in the white matter have progressed since 2010. Brainstem and cerebellum are intact. Negative for hemorrhage or mass.  MRA HEAD FINDINGS  Both vertebral  arteries are patent to the basilar. Left vertebral artery dominant. Mild disease in the distal vertebral artery bilaterally. PICA patent bilaterally. Basilar widely patent. Large right AICA is present. Superior cerebellar and posterior cerebral arteries are patent bilaterally. Posterior communicating artery is patent bilaterally, left greater than right.  Atherosclerotic disease in the cavernous carotid bilaterally. There is moderate stenosis in the right cavernous carotid and mild stenosis on the left. Anterior and middle cerebral arteries are patent bilaterally without significant stenosis.  Negative for cerebral aneurysm.  IMPRESSION: Acute infarcts in the deep white matter of the left cerebral hemisphere.  Mild intracranial atherosclerotic disease. 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   Electronically Signed   By: Franchot Gallo M.D.   On: 01/30/2014 19:36   Mr Brain Wo Contrast  01/30/2014   CLINICAL DATA:  Right hand numbness and weakness  EXAM: MRI HEAD WITHOUT CONTRAST  MRA HEAD WITHOUT CONTRAST  TECHNIQUE: Multiplanar, multiecho pulse sequences of the brain and surrounding structures were obtained without intravenous contrast. Angiographic images of the head were obtained using MRA technique without contrast.  COMPARISON:  MRI 04/20/2009  FINDINGS: MRI HEAD FINDINGS  Acute infarct involving the posterior external capsule on the left extending into the centrum semiovale. No other acute infarct.  Chronic microvascular ischemic changes in the white matter have progressed since 2010. Brainstem and cerebellum are intact. Negative for hemorrhage or mass.  MRA HEAD FINDINGS  Both vertebral arteries are patent to the basilar. Left vertebral artery dominant. Mild disease in the distal vertebral artery bilaterally. PICA patent bilaterally. Basilar widely patent. Large right AICA is present. Superior cerebellar and posterior  cerebral arteries are patent bilaterally. Posterior communicating artery is patent bilaterally, left greater than right.  Atherosclerotic disease in the cavernous carotid bilaterally. There is moderate stenosis in the right cavernous carotid and mild stenosis on the left. Anterior and middle cerebral arteries are patent bilaterally without significant stenosis.  Negative for cerebral aneurysm.  IMPRESSION: Acute infarcts in the deep white matter of the left cerebral hemisphere.  Mild intracranial  atherosclerotic disease. 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   Electronically Signed   By: Franchot Gallo M.D.   On: 01/30/2014 19:36    Assessment: 80 y.o. male presenting with mild right upper extremity weakness and difficulty with gait.  Symptoms started 2 days ago and therefore patient is out of the tPA window.  MRI of the brain reviewed and shows acute infarcts in the left deep white matter.  Patient on an aspirin a day at home.  Further work up recommended.  Stroke Risk Factors - hyperlipidemia and hypertension  Plan: 1. HgbA1c, fasting lipid panel 2. PT consult, OT consult, Speech consult 3. Echocardiogram 4. Carotid dopplers 5. Prophylactic therapy-Antiplatelet med: Aspirin - dose 325mg  daily 6. Risk factor modification 7. Telemetry monitoring 8. Frequent neuro checks   Alexis Goodell, MD [facility] [phone] 01/30/2014, 10:41 PM

## 2014-01-30 NOTE — ED Provider Notes (Signed)
CSN: [id number]     Arrival date & time 01/30/14  1341 History   First MD Initiated Contact with Patient 01/30/14 1806     Chief Complaint  Patient presents with  . Extremity Weakness     (Consider location/radiation/quality/duration/timing/severity/associated sxs/prior Treatment) Patient is a 80 y.o. male presenting with extremity weakness. The history is provided by the patient.  Extremity Weakness   He is here for evaluation of weakness in the right hand. He has had a problem using his right hand and feels like it does not have "balance", for the last 2 days. He feels that the sensation is improving. He saw his  doctor, today, who felt that he had assistant, weakness in his right arm and hand so sent him here, for evaluation. He denies paresthesias, nausea, vomiting, gait problems leg problems or trouble speaking. He's never had this previously. He denies fever or chills, cough, chest pain. There are no other known modifying factors.  Past Medical History  Diagnosis Date  . Hypertension   . Hypercholesteremia   . Murmur, cardiac   . Arthritis    No past surgical history on file. No family history on file. History  Substance Use Topics  . Smoking status: Not on file  . Smokeless tobacco: Not on file  . Alcohol Use: Not on file    Review of Systems  Musculoskeletal: Positive for extremity weakness.  All other systems reviewed and are negative.     Allergies  Sulfa antibiotics and Ramipril  Home Medications   Prior to Admission medications   Medication Sig Start Date End Date Taking? Authorizing Provider  amLODipine (NORVASC) 5 MG tablet Take 5 mg by mouth daily.   Yes Historical Provider, MD  aspirin EC 81 MG tablet Take 81 mg by mouth daily.   Yes Historical Provider, MD  Aspirin-Caffeine (BAYER BACK & BODY PAIN EX ST) 500-32.5 MG TABS Take 1-2 tablets by mouth every 6 (six) hours as needed (pain).   Yes Historical Provider, MD  PRESCRIPTION MEDICATION Place 1 drop  into both eyes at bedtime.   Yes Historical Provider, MD  rosuvastatin (CRESTOR) 5 MG tablet Take 5 mg by mouth at bedtime.   Yes Historical Provider, MD   BP 131/67  Pulse 70  Temp(Src) 98.2 F (36.8 C) (Oral)  Resp 26  SpO2 96% Physical Exam  Nursing note and vitals reviewed. Constitutional: He is oriented to person, place, and time. He appears well-developed.  Elderly, frail  HENT:  Head: Normocephalic and atraumatic.  Right Ear: External ear normal.  Left Ear: External ear normal.  Eyes: Conjunctivae and EOM are normal. Pupils are equal, round, and reactive to light.  Neck: Normal range of motion and phonation normal. Neck supple.  Cardiovascular: Normal rate, regular rhythm, normal heart sounds and intact distal pulses.   Pulmonary/Chest: Effort normal and breath sounds normal. He exhibits no bony tenderness.  Abdominal: Soft. There is no tenderness.  Musculoskeletal: Normal range of motion.  Neurological: He is alert and oriented to person, place, and time. No cranial nerve deficit or sensory deficit. He exhibits normal muscle tone. Coordination normal.  No dysarthria, aphasia or nystagmus. Normal finger-to-nose, bilaterally. Normal grip strength of both hands. Normal range of motion of arms, and legs. Normal gait.  Skin: Skin is warm, dry and intact.  Psychiatric: He has a normal mood and affect. His behavior is normal. Judgment and thought content normal.    ED Course  Procedures (including critical care time) Medications - No  data to display  Patient Vitals for the past 24 hrs:  BP Temp Temp src Pulse Resp SpO2  01/30/14 2000 131/67 mmHg - - 70 26 96 %  01/30/14 1945 132/75 mmHg - - 69 23 98 %  01/30/14 1351 134/71 mmHg 98.2 F (36.8 C) Oral 84 22 98 %   19:41- discussed with hospitalist, she will admit the patient  19:50- discussed with stroke neurologist, she will see the patient as a Optometrist  8:15 PM Reevaluation with update and discussion. After initial  assessment and treatment, an updated evaluation reveals he is comfortable. Findings discussed with patient, all questions answered to. WENTZ,ELLIOTT L    Labs Review Labs Reviewed  COMPREHENSIVE METABOLIC PANEL - Abnormal; Notable for the following:    Total Bilirubin 2.1 (*)    GFR calc non Af Amer 80 (*)    All other components within normal limits  URINALYSIS, ROUTINE W REFLEX MICROSCOPIC - Abnormal; Notable for the following:    Bilirubin Urine SMALL (*)    Urobilinogen, UA 4.0 (*)    All other components within normal limits  CBC WITH DIFFERENTIAL    Imaging Review Mr Virgel Paling Wo Contrast  01/30/2014   CLINICAL DATA:  Right hand numbness and weakness  EXAM: MRI HEAD WITHOUT CONTRAST  MRA HEAD WITHOUT CONTRAST  TECHNIQUE: Multiplanar, multiecho pulse sequences of the brain and surrounding structures were obtained without intravenous contrast. Angiographic images of the head were obtained using MRA technique without contrast.  COMPARISON:  MRI 04/20/2009  FINDINGS: MRI HEAD FINDINGS  Acute infarct involving the posterior external capsule on the left extending into the centrum semiovale. No other acute infarct.  Chronic microvascular ischemic changes in the white matter have progressed since 2010. Brainstem and cerebellum are intact. Negative for hemorrhage or mass.  MRA HEAD FINDINGS  Both vertebral arteries are patent to the basilar. Left vertebral artery dominant. Mild disease in the distal vertebral artery bilaterally. PICA patent bilaterally. Basilar widely patent. Large right AICA is present. Superior cerebellar and posterior cerebral arteries are patent bilaterally. Posterior communicating artery is patent bilaterally, left greater than right.  Atherosclerotic disease in the cavernous carotid bilaterally. There is moderate stenosis in the right cavernous carotid and mild stenosis on the left. Anterior and middle cerebral arteries are patent bilaterally without significant stenosis.   Negative for cerebral aneurysm.  IMPRESSION: Acute infarcts in the deep white matter of the left cerebral hemisphere.  Mild intracranial atherosclerotic disease. 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   Electronically Signed   By: Franchot Gallo M.D.   On: 01/30/2014 19:36   Mr Brain Wo Contrast  01/30/2014   CLINICAL DATA:  Right hand numbness and weakness  EXAM: MRI HEAD WITHOUT CONTRAST  MRA HEAD WITHOUT CONTRAST  TECHNIQUE: Multiplanar, multiecho pulse sequences of the brain and surrounding structures were obtained without intravenous contrast. Angiographic images of the head were obtained using MRA technique without contrast.  COMPARISON:  MRI 04/20/2009  FINDINGS: MRI HEAD FINDINGS  Acute infarct involving the posterior external capsule on the left extending into the centrum semiovale. No other acute infarct.  Chronic microvascular ischemic changes in the white matter have progressed since 2010. Brainstem and cerebellum are intact. Negative for hemorrhage or mass.  MRA HEAD FINDINGS  Both vertebral arteries are patent to the basilar. Left vertebral artery dominant. Mild disease in the distal vertebral artery bilaterally. PICA patent bilaterally. Basilar widely patent. Large  right AICA is present. Superior cerebellar and posterior cerebral arteries are patent bilaterally. Posterior communicating artery is patent bilaterally, left greater than right.  Atherosclerotic disease in the cavernous carotid bilaterally. There is moderate stenosis in the right cavernous carotid and mild stenosis on the left. Anterior and middle cerebral arteries are patent bilaterally without significant stenosis.  Negative for cerebral aneurysm.  IMPRESSION: Acute infarcts in the deep white matter of the left cerebral hemisphere.  Mild intracranial atherosclerotic disease. 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   Electronically Signed   By: Franchot Gallo M.D.   On: 01/30/2014 19:36     EKG Interpretation   Date/Time:  Wednesday January 30 2014 13:51:06 EDT Ventricular Rate:  80 PR Interval:  228 QRS Duration: 90 QT Interval:  408 QTC Calculation: 470 R Axis:   65 Text Interpretation:  Sinus rhythm with 1st degree A-V block Low voltage  QRS Septal infarct , age undetermined Abnormal ECG since last tracing no  significant change Confirmed by Eulis Foster  MD, ELLIOTT [phone]) on 01/30/2014  6:11:11 PM      MDM   Final diagnoses:  CVA (cerebral infarction)  Hyperlipidemia  Hypertension  Right arm weakness    Acute CVA, left brain. Patient will require admission for further observation, and treatment  Nursing Notes Reviewed/ Care Coordinated, and agree without changes. Applicable Imaging Reviewed.  Interpretation of Laboratory Data incorporated into ED treatment  Plan: Admit  Richarda Blade, MD 01/30/14 2021

## 2014-01-30 NOTE — ED Notes (Addendum)
Pt sent by MD for evaluation of right sided weakness starting Monday. States he cannot grip well. Right sided foot edema that pt reports comes and goes. Denies chest pain or SOB. Grips equal for RN. Pt reports he is lacking depth perception with right hand. Arthritis also reports in hands. Reports balance may be off.

## 2014-01-31 ENCOUNTER — Encounter (HOSPITAL_COMMUNITY): Payer: Self-pay | Admitting: General Practice

## 2014-01-31 DIAGNOSIS — I6350 Cerebral infarction due to unspecified occlusion or stenosis of unspecified cerebral artery: Secondary | ICD-10-CM

## 2014-01-31 DIAGNOSIS — E785 Hyperlipidemia, unspecified: Secondary | ICD-10-CM

## 2014-01-31 DIAGNOSIS — I319 Disease of pericardium, unspecified: Secondary | ICD-10-CM

## 2014-01-31 DIAGNOSIS — I10 Essential (primary) hypertension: Secondary | ICD-10-CM

## 2014-01-31 LAB — HEMOGLOBIN A1C
Hgb A1c MFr Bld: 5.9 % — ABNORMAL HIGH (ref <5.7)
Mean Plasma Glucose: 123 mg/dL — ABNORMAL HIGH (ref <117)

## 2014-01-31 LAB — LIPID PANEL
CHOLESTEROL: 117 mg/dL (ref 0–200)
HDL: 55 mg/dL (ref >39)
LDL Cholesterol: 51 mg/dL (ref 0–99)
Total CHOL/HDL Ratio: 2.1 RATIO
Triglycerides: 53 mg/dL (ref <150)
VLDL: 11 mg/dL (ref 0–40)

## 2014-01-31 MED ORDER — CLOPIDOGREL BISULFATE 75 MG PO TABS
75.0000 mg | ORAL_TABLET | Freq: Every day | ORAL | Status: DC
  Administered 2014-01-31 – 2014-02-01 (×2): 75 mg via ORAL
  Filled 2014-01-31 (×2): qty 1

## 2014-01-31 NOTE — Progress Notes (Signed)
*  PRELIMINARY RESULTS* Vascular Ultrasound Carotid Duplex (Doppler) has been completed.  Preliminary findings: Bilateral:  1-39% ICA stenosis.  Vertebral artery flow is antegrade.      [name], Gregory FRANCES 01/31/2014, 7:12 PM

## 2014-01-31 NOTE — Evaluation (Signed)
Physical Therapy Evaluation Patient Details Name: Gregory [name] MRN: [id number] DOB: 02-25-1930 Today's Date: 01/31/2014   History of Present Illness  Niklaus T. Careaga is an 80 y.o. Male admitted 01/30/14 for Right arm weakness. MRI on 01/30/14 presents with Acute infarct involving the posterior external capsule on the left.  Pt with significant PMHx of HTN, arthritis.   Clinical Impression  Pt is mobilizing well, only minimal signs of imbalance, and he is able to compensate without external assist.  PT will follow acutely, but he will likely not need f/u at d/c.      Follow Up Recommendations No PT follow up    Equipment Recommendations  None recommended by PT    Recommendations for Other Services   NA     Precautions / Restrictions Precautions Precautions: Fall Precaution Comments: per pt report he stumbles quite frequently at home Restrictions Weight Bearing Restrictions: No      Mobility  Bed Mobility Overal bed mobility: Independent                Transfers Overall transfer level: Modified independent Equipment used: None                Ambulation/Gait Ambulation/Gait assistance: Supervision Ambulation Distance (Feet): 200 Feet Assistive device: None Gait Pattern/deviations: Step-through pattern;Staggering left;Staggering right     General Gait Details: Pt with mildly staggering gait pattern.  At times gets really close to his left shoulder to door ways and walls.    Stairs Stairs: Yes Stairs assistance: Supervision Stair Management: One rail Right;Alternating pattern;Forwards Number of Stairs: 9 General stair comments: Pt did well overall with stairs, however, he did have to be cued to make sure his right foot was all the way on the stair x2 on the ascent      Modified Rankin (Stroke Patients Only) Modified Rankin (Stroke Patients Only) Pre-Morbid Rankin Score: No symptoms Modified Rankin: Slight disability     Balance Overall balance  assessment: Needs assistance Sitting-balance support: Feet supported;No upper extremity supported Sitting balance-Leahy Scale: Normal     Standing balance support: No upper extremity supported Standing balance-Leahy Scale: Good                               Pertinent Vitals/Pain See vitals flow sheet.     Home Living Family/patient expects to be discharged to:: Private residence Living Arrangements: Alone;Other relatives Available Help at Discharge: Family;Available PRN/intermittently Type of Home: House Home Access: Stairs to enter Entrance Stairs-Rails: None Entrance Stairs-Number of Steps: 3 Home Layout: One level Home Equipment: Cane - single point      Prior Function Level of Independence: Independent         Comments: drives     Hand Dominance   Dominant Hand: Right    Extremity/Trunk Assessment   Upper Extremity Assessment: Defer to OT evaluation           Lower Extremity Assessment: Overall WFL for tasks assessed      Cervical / Trunk Assessment: Normal  Communication   Communication: No difficulties  Cognition Arousal/Alertness: Awake/alert Behavior During Therapy: WFL for tasks assessed/performed Overall Cognitive Status: Within Functional Limits for tasks assessed                               Assessment/Plan    PT Assessment Patient needs continued PT services  PT Diagnosis Difficulty walking;Abnormality  of gait   PT Problem List Decreased balance;Decreased mobility  PT Treatment Interventions DME instruction;Gait training;Stair training;Functional mobility training;Therapeutic activities;Therapeutic exercise;Neuromuscular re-education;Balance training;Patient/family education   PT Goals (Current goals can be found in the Care Plan section) Acute Rehab PT Goals Patient Stated Goal: to go home later today PT Goal Formulation: With patient Time For Goal Achievement: 02/14/14 Potential to Achieve Goals: Good     Frequency Min 4X/week    End of Session   Activity Tolerance: Patient tolerated treatment well Patient left: in bed;with call bell/phone within reach           Time: 1422-1440 PT Time Calculation (min): 18 min   Charges:   PT Evaluation $Initial PT Evaluation Tier I: 1 Procedure PT Treatments $Gait Training: 8-22 mins        Rebecca B. Whitehouse, [place], DPT [phone]   01/31/2014, 10:54 PM

## 2014-01-31 NOTE — Progress Notes (Addendum)
[facility] Progress Note   Gregory [name] OZH:[id number] DOB: April 11, 1930 DOA: 01/30/2014 PCP: [name], MD  Brief narrative: Gregory [name] is a 80 y.o. male  presents with poor coordination in right arm- see H and P. He continues to have the symptoms.  Subjective: States his right arm has been giving him problems- have difficult explained why.   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"   Code Status: Full code Family Communication: none Disposition Plan: home  Consultants: Stroke team  Procedures: none  Antibiotics: Anti-infectives   None       DVT prophylaxis: SCDs  Objective: [facility] Weights   01/30/14 2054  Weight: 83.915 kg (185 lb)    Vitals Filed Vitals:   01/31/14 0505 01/31/14 0813 01/31/14 1004 01/31/14 1411  BP: 109/59 126/73 137/77 134/64  Pulse: 65 74 73 81  Temp: 98.4 F (36.9 C) 99.1 F (37.3 C) 98 F (36.7 C) 98.1 F (36.7 C)  TempSrc: Oral Oral Oral Oral  Resp: 18 18 18 18   Height:      Weight:      SpO2: 97% 97% 96% 99%      Intake/Output Summary (Last 24 hours) at 01/31/14 1712 Last data filed at 01/31/14 1300  Gross per 24 hour  Intake    360 ml  Output      0 ml  Net    360 ml     Exam: General: No acute respiratory distress Lungs: Clear to auscultation bilaterally without wheezes or crackles Cardiovascular: Regular rate and rhythm without murmur gallop or rub normal S1 and S2 Abdomen: Nontender, nondistended, soft, bowel sounds positive, no rebound, no ascites, no appreciable mass Extremities: No significant cyanosis, clubbing, or edema bilateral lower extremities Neuro: finger  to nose test in right arm reveals that he has poor coordination, strength 4/5 in right shoulder and forearm- 5/5 in all other extremities, right facial droop  Data Reviewed: Basic Metabolic Panel:  Recent Labs Lab 01/30/14 1359  NA 142  K 4.4  CL 106  CO2 21  GLUCOSE 93  BUN 16  CREATININE 0.88  CALCIUM 9.6   Liver Function Tests:  Recent Labs Lab 01/30/14 1359  AST 31  ALT 14  ALKPHOS 84  BILITOT 2.1*  PROT 7.2  ALBUMIN 4.0   No results found for this basename: LIPASE, AMYLASE,  in the last 168 hours No results found for this basename: AMMONIA,  in the last 168 hours CBC:  Recent Labs Lab 01/30/14 1359  WBC 7.1  NEUTROABS 4.2  HGB 14.5  HCT 43.0  MCV 86.7  PLT 158   Cardiac Enzymes: No results found for this basename: CKTOTAL, CKMB, CKMBINDEX, TROPONINI,  in the last 168 hours BNP (last 3 results) No results found for this basename: PROBNP,  in the last 8760 hours CBG: No results found for this basename: GLUCAP,  in the last 168 hours  No results found for this or any previous visit (from the past 240 hour(s)).   Studies:  Recent x-ray studies have been reviewed in detail by the Attending Physician  Scheduled Meds:  Scheduled Meds: . amLODipine  5 mg Oral Daily  . atorvastatin  10 mg Oral q1800  . clopidogrel  75 mg Oral Q breakfast   Continuous Infusions:   Time spent on care of this patient: > 35 min   Alamillo, MD 01/31/2014, 5:12 PM  LOS: 1 day   [facility] Office  [phone] Pager - Text Page per Shea Evans   If 7PM-7AM, please contact night-coverage [email or web address]

## 2014-01-31 NOTE — Progress Notes (Signed)
Occupational Therapy Evaluation and Discharge Patient Details Name: Gregory [name] MRN: [id number] DOB: 1929-11-27 Today's Date: 01/31/2014    History of Present Illness Gregory [name] is an 80 y.o. Male admitted 01/30/14 for Right arm weakness. MRI on 01/30/14 presents with Acute infarct involving the posterior external capsule on the left.   Clinical Impression   PTA pt lived at home and was independent with ADLs and functional activity. Pt currently at [place] level for ADLs, however continues to have RUE tingling and weakness. Pt performed therapeutic exercise and activities to increase gross motor strength and fine motor strength and coordination in RUE. Pt would benefit from OP OT (neuro) to rehab RUE.    Follow Up Recommendations  Outpatient OT (Neuro OPOT)    Equipment Recommendations  None recommended by OT       Precautions / Restrictions Restrictions Weight Bearing Restrictions: No      Mobility Bed Mobility Overal bed mobility: Independent                Transfers Overall transfer level: Modified independent Equipment used: None             General transfer comment: When OT entered room, pt was standing at bathroom sink shaving. Observed pt to move around room at [place] level. Pt had no LOB, however did appear to come close to doorways and objects on his Right side.          ADL Overall ADL's : Modified independent                                       General ADL Comments: Pt overall Mod Independent for ADLs. Educated pt on wearing pants with buttons for functional practice of Bi-manual task. Educated pt on hand coordination and strengthening activities and use of daily functional tasks to improve R hand strength and coordination.       Vision  Pt reports no change from baseline.  Pt wears glasses at all times (progressive lenses).   Pt reports that he is nearly blind in L eye PTA.              Additional  Comments: Briefly assessed vision. Pt reports that he is essentially blind in his L eye.  No apparent visual field cuts, - nystagmus, no diplopia. Pt able to track finger with smooth movements in vertical and horizontal directions.    Perception Perception Perception Tested?: No   Praxis Praxis Praxis tested?: Within functional limits    Pertinent Vitals/Pain NAD     Hand Dominance Right   Extremity/Trunk Assessment Upper Extremity Assessment Upper Extremity Assessment: RUE deficits/detail RUE Deficits / Details: Shoulder flexion 4+/5, supination/pronation 4-/5, elbow flexion and extension 5/5. Decreased grip strength. Weak intrinsic muscles, incoordination.  RUE Sensation: decreased light touch (pt reports slightly less when compared to LUE) RUE Coordination: decreased gross motor;decreased fine motor   Lower Extremity Assessment Lower Extremity Assessment: Defer to PT evaluation   Cervical / Trunk Assessment Cervical / Trunk Assessment: Normal   Communication Communication Communication: No difficulties   Cognition Arousal/Alertness: Awake/alert Behavior During Therapy: WFL for tasks assessed/performed Overall Cognitive Status: Within Functional Limits for tasks assessed                        Exercises Exercises: Other exercises;General Upper Extremity Other Exercises Other Exercises: Provided pt with theraputty (  yellow) and theraputty activities to complete at home. Performed coordination and strengthening activities with pt to increase fine motor coordination and dexterity in R, dominant hand. General upper extremity:    01/31/14 1500  General Exercises - Upper Extremity  Shoulder Flexion AROM;Right;10 reps;Seated  Shoulder ABduction AROM;Right;10 reps;Seated  Elbow Flexion AROM;Right;10 reps;Seated  Elbow Extension AROM;Right;10 reps;Seated  Wrist Flexion AROM;Right;10 reps;Seated  Wrist Extension AROM;Right;10 reps;Seated  Digit Composite Flexion  AROM;Right;10 reps;Seated  Composite Extension AROM;Right;10 reps;Seated           Home Living Family/patient expects to be discharged to:: Private residence Living Arrangements: Alone;Other relatives Available Help at Discharge: Family;Available PRN/intermittently Type of Home: House Home Access: Stairs to enter [employer] of Steps: 3 Entrance Stairs-Rails: None Home Layout: One level     Bathroom Shower/Tub: Occupational psychologist: Standard     Home Equipment: Cane - single point          Prior Functioning/Environment Level of Independence: Independent        Comments: drives                              End of Session Equipment Utilized During Treatment: Other (comment) (Theraputty (yellow))  Activity Tolerance: Patient tolerated treatment well Patient left: in chair;with call bell/phone within reach   Time: 3428-7681 OT Time Calculation (min): 43 min Charges:  OT General Charges $OT Visit: 1 Procedure OT Evaluation $Initial OT Evaluation Tier I: 1 Procedure OT Treatments $Therapeutic Exercise: 23-37 mins  Juluis Rainier [phone] 01/31/2014, 4:12 PM

## 2014-01-31 NOTE — Progress Notes (Signed)
Patient arrived a little before 2100, all acute symptoms had resolved radiology took him down for chest x-ray please read impression in results (there is some question about right basilar findings). No complaints of pain but does have ongoing numbness in left hand which is a chronic condition.  SCD'ds and Telemetry (NSR) on patient, will continue to monitor.

## 2014-01-31 NOTE — Progress Notes (Signed)
Stroke Team Progress Note  HISTORY Gregory [name] is an 80 y.o. male who reports awakening on Monday and noting that he was unable to use his right arm correctly. He noted that if he was trying to reach for something he would overshoot or undershoot his reach. He also felt that his gait was a little off balance. He did not note any weakness in the lower extremities or dizziness. Symptoms continued until today 01/30/2014 when he called his physician who recommended that he be seen at the hospital. He was last known well 01/27/2014 at 23:00.  Patient was not administered TPA secondary to delay in arrival. He was admitted for further evaluation and treatment.  SUBJECTIVE No friends, family are at the bedside.  Overall he feels his condition is stable.   OBJECTIVE Most recent Vital Signs: Filed Vitals:   01/31/14 0329 01/31/14 0505 01/31/14 0813 01/31/14 1004  BP: 119/74 109/59 126/73 137/77  Pulse: 66 65 74 73  Temp: 98.1 F (36.7 C) 98.4 F (36.9 C) 99.1 F (37.3 C) 98 F (36.7 C)  TempSrc: Oral Oral Oral Oral  Resp: 18 18 18 18   Height:      Weight:      SpO2: 98% 97% 97% 96%   CBG (last 3)  No results found for this basename: GLUCAP,  in the last 72 hours  IV Fluid Intake:     MEDICATIONS  . amLODipine  5 mg Oral Daily  . aspirin EC  81 mg Oral Daily  . atorvastatin  10 mg Oral q1800   PRN:  senna-docusate  Diet:  General thin liquids Activity:  Bedrest DVT Prophylaxis:  SCDs   CLINICALLY SIGNIFICANT STUDIES Basic Metabolic Panel:  Recent Labs Lab 01/30/14 1359  NA 142  K 4.4  CL 106  CO2 21  GLUCOSE 93  BUN 16  CREATININE 0.88  CALCIUM 9.6   Liver Function Tests:  Recent Labs Lab 01/30/14 1359  AST 31  ALT 14  ALKPHOS 84  BILITOT 2.1*  PROT 7.2  ALBUMIN 4.0   CBC:  Recent Labs Lab 01/30/14 1359  WBC 7.1  NEUTROABS 4.2  HGB 14.5  HCT 43.0  MCV 86.7  PLT 158   Coagulation: No results found for this basename: LABPROT, INR,  in the last 168  hours Cardiac Enzymes: No results found for this basename: CKTOTAL, CKMB, CKMBINDEX, TROPONINI,  in the last 168 hours Urinalysis:  Recent Labs Lab 01/30/14 1402  COLORURINE YELLOW  LABSPEC 1.024  PHURINE 6.5  GLUCOSEU NEGATIVE  Orange 4.0*  NITRITE NEGATIVE  LEUKOCYTESUR NEGATIVE   Lipid Panel    Component Value Date/Time   CHOL 117 01/31/2014 0600   TRIG 53 01/31/2014 0600   HDL 55 01/31/2014 0600   CHOLHDL 2.1 01/31/2014 0600   VLDL 11 01/31/2014 0600   LDLCALC 51 01/31/2014 0600   HgbA1C  No results found for this basename: HGBA1C    Urine Drug Screen:   No results found for this basename: labopia, cocainscrnur, labbenz, amphetmu, thcu, labbarb    Alcohol Level: No results found for this basename: ETH,  in the last 168 hours   MRI of the brain  01/30/2014    Acute infarcts in the deep white matter of the left cerebral hemisphere.  Mild intracranial atherosclerotic disease.  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  2D Echocardiogram    Carotid Doppler    CXR  01/31/2014    Mild right basilar airspace opacity may reflect atelectasis or mild pneumonia.  EKG  Sinus rhythm with 1st degree A-V block. For complete results please see formal report.   Therapy Recommendations   Physical Exam   Pleasant elderly African American male currently not in distress.Awake alert. Afebrile. Head is nontraumatic. Neck is supple without bruit. Hearing is normal. Cardiac exam no murmur or gallop. Lungs are clear to auscultation. Distal pulses are well felt. Neurological Exam : Awake alert oriented x 3 normal speech and language. Mild right lower face asymmetry. Tongue midline. No drift. Mild diminished fine finger movements on right with right grip weakness. Orbits left over right upper extremity.  .  Normal sensation . Normal coordination. ASSESSMENT [name] is a 80 y.o. male presenting with right arm weakness.  Imaging confirms left deep white matter infarcts. Infarct felt to be thrombotic secondary to small vessel disease, though workup is underway.  On aspirin 81 mg orally every day prior to admission. Now on aspirin 81 mg orally every day for secondary stroke prevention. Patient with resultant right hemiparesis. Stroke work up underway.  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  OOB, therapy evals  F/u HgbA1c,  2D and carotid dopplers  SIGNED Burnetta Sabin, MSN, RN, ANVP-BC, ANP-BC, GNP-BC [facility] Pager: [phone] 01/31/2014 11:50 AM   I have personally obtained a history, examined the patient, evaluated imaging results, and formulated the assessment and plan of care. I agree with the above. Antony Contras, MD   To contact Stroke Continuity Tawanna Funk, please refer to [email or web address] After hours, contact General Neurology

## 2014-01-31 NOTE — Progress Notes (Signed)
CARE MANAGEMENT NOTE 01/31/2014  Patient:  Gregory [name], Gregory [name]   Account Number:  [id number]  Date Initiated:  01/31/2014  Documentation initiated by:  Olga Coaster  Subjective/Objective Assessment:   ADMITTED WITH CVA     Action/Plan:   CM FOLLOWING FOR DCP   Anticipated DC Date:  02/02/2014   Anticipated DC Plan:  AWAITING FOR PT/OT EVALS FOR DISPOSITION NEEDS;    DC Planning Services  CM consult              Status of service:  In process, will continue to follow Medicare Important Message given?  NA - LOS <3 / Initial given by admissions (If response is "NO", the following Medicare IM given date fields will be blank)  Per UR Regulation:  Reviewed for med. necessity/level of care/duration of stay  Comments:  6/18/2015Mindi Slicker RN,BSN,MHA [phone]

## 2014-01-31 NOTE — Progress Notes (Signed)
Nutrition Brief Note  Patient identified on the Malnutrition Screening Tool (MST) Report.  Wt Readings from Last 15 Encounters:  01/30/14 185 lb (83.915 kg)    Body mass index is 25.81 kg/(m^2). Patient meets criteria for Overweight based on current BMI. MST was filed inaccurately, pt denied recent weight loss but, did report decreased appetite.  Current diet order is Regular. Patient out of room at time of visit. Per nursing notes patient consumed approximately 100% of lunch. Labs and medications reviewed. Lipid panel WNL. Hgb A1C pending.   No nutrition interventions warranted at this time. If nutrition issues arise, please consult RD.   Pryor Ochoa RD, LDN Inpatient Clinical Dietitian Pager: [phone] After Hours Pager: [phone]

## 2014-01-31 NOTE — Evaluation (Signed)
Speech Language Pathology Evaluation Patient Details Name: Gregory [name] MRN: [id number] DOB: 23-Oct-1929 Today's Date: 01/31/2014 Time: 6734-1937 SLP Time Calculation (min): 22 min  Problem List:  Patient Active Problem List   Diagnosis Date Noted  . CVA (cerebral infarction) 01/30/2014  . Right arm weakness 01/30/2014  . Hypertension 12/05/2010  . Hyperlipidemia 12/05/2010   Past Medical History:  Past Medical History  Diagnosis Date  . Hypertension   . Hypercholesteremia   . Murmur, cardiac   . Arthritis    Past Surgical History: History reviewed. No pertinent past surgical history. HPI:  80 year old male with past medical history of hypertension, dyslipidemia who presented to [facility] ED 01/30/2014 with complaints of ongoing right arm weakness for past 2 days prior to this admission with some gait imbalance but no associated falls or loss of consciousness. MRI was positive for acute infarcts in the deep white matter of the left cerebral hemisphere.    Assessment / Plan / Recommendation Clinical Impression  Pt reports mild difficulty with memory, which is appreciated during this exam as mild trouble with retrieval of new information. Speech and communication are [facility] with an occasional stutter that pt reports is his baseline. Pt described several strategies that he already uses at home to facilitate his memory, and shares that he believes he is at his baseline. SLP reinforced utilization of memory strategies, and encouraged patient to continue to use them in his daily routines at home. Given that patient appears to be at his baseline and is already compensating for mildly decreased retrieval, no further SLP f/u is recommended at this time.    SLP Assessment  Patient does not need any further Speech Lanaguage Pathology Services    Follow Up Recommendations  None    Frequency and Duration        Pertinent Vitals/Pain n/a   SLP Goals     SLP Evaluation Prior Functioning  Cognitive/Linguistic Baseline: Baseline deficits Baseline deficit details: pt reports mild memory impairment at baseline  Lives With: Other (Comment) (lives with Film/video editor and her son, but reports that they are leaving tomorrow (6/19))   Cognition  Overall Cognitive Status: History of cognitive impairments - at baseline    Comprehension  Auditory Comprehension Overall Auditory Comprehension: Appears within functional limits for tasks assessed Reading Comprehension Reading Status: Within funtional limits    Expression Expression Primary Mode of Expression: Verbal Verbal Expression Overall Verbal Expression: Appears within functional limits for tasks assessed Written Expression Written Expression: Not tested   Oral / Motor Motor Speech Overall Motor Speech: Appears within functional limits for tasks assessed   GO      Germain Osgood, M.A. CCC-SLP [phone]  Germain Osgood 01/31/2014, 11:58 AM

## 2014-01-31 NOTE — Progress Notes (Signed)
  Echocardiogram 2D Echocardiogram has been performed.  [name], Gregory FRANCES 01/31/2014, 5:45 PM

## 2014-02-01 DIAGNOSIS — I6320 Cerebral infarction due to unspecified occlusion or stenosis of unspecified precerebral arteries: Secondary | ICD-10-CM

## 2014-02-01 MED ORDER — CLOPIDOGREL BISULFATE 75 MG PO TABS: 75.0000 mg | ORAL_TABLET | Freq: Every day | ORAL | Status: DC

## 2014-02-01 NOTE — Discharge Summary (Signed)
Physician Discharge Summary  Gregory [name] FUX:[id number] DOB: 06/18/1930 DOA: 01/30/2014  PCP: [name], MD  Admit date: 01/30/2014 Discharge date: 02/01/2014  Time spent: >45 minutes  Recommendations for Outpatient Follow-up:  1.   Discharge Diagnoses:  Principal Problem:   Right arm weakness Active Problems:   Hypertension   Hyperlipidemia   CVA (cerebral infarction)   Discharge Condition: stable  Diet recommendation: heart healthy  Filed Weights   01/30/14 2054 02/01/14 0615  Weight: 83.915 kg (185 lb) 85.957 kg (189 lb 8 oz)    History of present illness:  80 year old male with past medical history of hypertension, dyslipidemia who presented to [facility] ED 01/30/2014 with complaints of ongoing right arm weakness for past 2 days prior to this admission with some gait imbalance but no associated falls or loss of consciousness(from HPI) He was noted on exam to have right arm coordination issues on exam. MRI revealed a CVA (see below)  Hospital Course:  Principal Problem:  Principal Problem:  CVA with Right arm weakness/ poor coordination/ right facial droop  - noted on MRI to have acute infarcts in left cerebral hemisphere  - ECHO- no thrombus noted- only grade 1 diastolic dysfunction carotid doppler Preliminary findings: Bilateral: 1-39% ICA stenosis. Vertebral artery flow is antegrade. 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"    Procedures:  Carotid doppler and ECHO mentioned above  Consultations:  Neuro  Discharge Exam: Filed Vitals:   02/01/14 0943  BP: 118/67  Pulse: 75  Temp: 98 F (36.7 C)  Resp: 18   General: No acute respiratory distress  Lungs: Clear to auscultation bilaterally without wheezes or crackles  Cardiovascular: Regular rate and rhythm without murmur  gallop or rub normal S1 and S2  Abdomen: Nontender, nondistended, soft, bowel sounds positive, no rebound, no ascites, no appreciable mass  Extremities: No significant cyanosis, clubbing, or edema bilateral lower extremities  Neuro: finger to nose test in right arm reveals that he has poor coordination, strength 4/5 in right shoulder and forearm- 5/5 in all other extremities, right facial droop   Discharge Instructions You were cared for by a hospitalist during your hospital stay. If you have any questions about your discharge medications or the care you received while you were in the hospital after you are discharged, you can call the unit and asked to speak with the hospitalist on call if the hospitalist that took care of you is not available. Once you are discharged, your primary care physician will handle any further medical issues. Please note that NO REFILLS for any discharge medications will be authorized once you are discharged, as it is imperative that you return to your primary care physician (or establish a relationship with a primary care physician if you do not have one) for your aftercare needs so that they can reassess your need for medications and monitor your lab values.  Discharge Instructions   Ambulatory referral to Occupational Therapy    Complete by:  As directed   CVA     Diet - low sodium heart healthy    Complete by:  As directed   Low cholesterol     Increase activity slowly    Complete by:  As directed  Medication List    STOP taking these medications       aspirin EC 81 MG tablet     BAYER BACK & BODY PAIN EX ST 500-32.5 MG Tabs  Generic drug:  Aspirin-Caffeine      TAKE these medications       amLODipine 5 MG tablet  Commonly known as:  NORVASC  Take 5 mg by mouth daily.     clopidogrel 75 MG tablet  Commonly known as:  PLAVIX  Take 1 tablet (75 mg total) by mouth daily with breakfast.     PRESCRIPTION MEDICATION  Place 1 drop into  both eyes at bedtime.     rosuvastatin 5 MG tablet  Commonly known as:  CRESTOR  Take 5 mg by mouth at bedtime.       Allergies  Allergen Reactions  . Sulfa Antibiotics   . Ramipril        Follow-up Information   Follow up with [facility]. (tele [phone])    Contact information:   [place] [id number]       Follow up with Xu,Jindong, MD. Schedule an appointment as soon as possible for a visit in 2 months. (Stroke Clinic)    Specialty:  Neurology   Contact information:   [address] [place] [address] [phone]        The results of significant diagnostics from this hospitalization (including imaging, microbiology, ancillary and laboratory) are listed below for reference.    Significant Diagnostic Studies: Dg Chest 2 View  01/31/2014   CLINICAL DATA:  CVA.  EXAM: CHEST  2 VIEW  COMPARISON:  None.  FINDINGS: The lungs are well-aerated. Mild right basilar airspace opacity may reflect atelectasis or mild pneumonia. Pulmonary vascularity is at the upper limits of normal. There is no evidence of pleural effusion or pneumothorax.  The heart is borderline normal in size; the mediastinal contour is within normal limits. No acute osseous abnormalities are seen.  IMPRESSION: Mild right basilar airspace opacity may reflect atelectasis or mild pneumonia.   Electronically Signed   By: Garald Balding M.D.   On: 01/31/2014 02:11   Mr Jodene Nam Head Wo Contrast  01/30/2014   CLINICAL DATA:  Right hand numbness and weakness  EXAM: MRI HEAD WITHOUT CONTRAST  MRA HEAD WITHOUT CONTRAST  TECHNIQUE: Multiplanar, multiecho pulse sequences of the brain and surrounding structures were obtained without intravenous contrast. Angiographic images of the head were obtained using MRA technique without contrast.  COMPARISON:  MRI 04/20/2009  FINDINGS: MRI HEAD FINDINGS  Acute infarct involving the posterior external capsule on the left extending into the centrum  semiovale. No other acute infarct.  Chronic microvascular ischemic changes in the white matter have progressed since 2010. Brainstem and cerebellum are intact. Negative for hemorrhage or mass.  MRA HEAD FINDINGS  Both vertebral arteries are patent to the basilar. Left vertebral artery dominant. Mild disease in the distal vertebral artery bilaterally. PICA patent bilaterally. Basilar widely patent. Large right AICA is present. Superior cerebellar and posterior cerebral arteries are patent bilaterally. Posterior communicating artery is patent bilaterally, left greater than right.  Atherosclerotic disease in the cavernous carotid bilaterally. There is moderate stenosis in the right cavernous carotid and mild stenosis on the left. Anterior and middle cerebral arteries are patent bilaterally without significant stenosis.  Negative for cerebral aneurysm.  IMPRESSION: Acute infarcts in the deep white matter of the left cerebral hemisphere.  Mild intracranial atherosclerotic disease. There is a moderately  severe stenosis of the right cavernous carotid and mild irregularity in the left cavernous carotid.   Electronically Signed   By: Franchot Gallo M.D.   On: 01/30/2014 19:36   Mr Brain Wo Contrast  01/30/2014   CLINICAL DATA:  Right hand numbness and weakness  EXAM: MRI HEAD WITHOUT CONTRAST  MRA HEAD WITHOUT CONTRAST  TECHNIQUE: Multiplanar, multiecho pulse sequences of the brain and surrounding structures were obtained without intravenous contrast. Angiographic images of the head were obtained using MRA technique without contrast.  COMPARISON:  MRI 04/20/2009  FINDINGS: MRI HEAD FINDINGS  Acute infarct involving the posterior external capsule on the left extending into the centrum semiovale. No other acute infarct.  Chronic microvascular ischemic changes in the white matter have progressed since 2010. Brainstem and cerebellum are intact. Negative for hemorrhage or mass.  MRA HEAD FINDINGS  Both vertebral arteries are  patent to the basilar. Left vertebral artery dominant. Mild disease in the distal vertebral artery bilaterally. PICA patent bilaterally. Basilar widely patent. Large right AICA is present. Superior cerebellar and posterior cerebral arteries are patent bilaterally. Posterior communicating artery is patent bilaterally, left greater than right.  Atherosclerotic disease in the cavernous carotid bilaterally. There is moderate stenosis in the right cavernous carotid and mild stenosis on the left. Anterior and middle cerebral arteries are patent bilaterally without significant stenosis.  Negative for cerebral aneurysm.  IMPRESSION: Acute infarcts in the deep white matter of the left cerebral hemisphere.  Mild intracranial atherosclerotic disease. 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   Electronically Signed   By: Franchot Gallo M.D.   On: 01/30/2014 19:36    Microbiology: No results found for this or any previous visit (from the past 240 hour(s)).   Labs: Basic Metabolic Panel:  Recent Labs Lab 01/30/14 1359  NA 142  K 4.4  CL 106  CO2 21  GLUCOSE 93  BUN 16  CREATININE 0.88  CALCIUM 9.6   Liver Function Tests:  Recent Labs Lab 01/30/14 1359  AST 31  ALT 14  ALKPHOS 84  BILITOT 2.1*  PROT 7.2  ALBUMIN 4.0   No results found for this basename: LIPASE, AMYLASE,  in the last 168 hours No results found for this basename: AMMONIA,  in the last 168 hours CBC:  Recent Labs Lab 01/30/14 1359  WBC 7.1  NEUTROABS 4.2  HGB 14.5  HCT 43.0  MCV 86.7  PLT 158   Cardiac Enzymes: No results found for this basename: CKTOTAL, CKMB, CKMBINDEX, TROPONINI,  in the last 168 hours BNP: BNP (last 3 results) No results found for this basename: PROBNP,  in the last 8760 hours CBG: No results found for this basename: GLUCAP,  in the last 168 hours     Signed:  Debbe Odea, MD [facility] 02/01/2014,  1:01 PM

## 2014-02-01 NOTE — Progress Notes (Signed)
Pt discharge home. Pt transported off unit via wheelchair with belongings at side. Francis Gaines Kuffour RN.

## 2014-02-01 NOTE — Progress Notes (Signed)
Stroke Team Progress Note  HISTORY Gregory [name] is an 80 y.o. male who reports awakening on Monday and noting that he was unable to use his right arm correctly. He noted that if he was trying to reach for something he would overshoot or undershoot his reach. He also felt that his gait was a little off balance. He did not note any weakness in the lower extremities or dizziness. Symptoms continued until today 01/30/2014 when he called his physician who recommended that he be seen at the hospital. He was last known well 01/27/2014 at 23:00.  Patient was not administered TPA secondary to delay in arrival. He was admitted for further evaluation and treatment.  SUBJECTIVE Patient sitting up in chair. No complaints.  OBJECTIVE Most recent Vital Signs: Filed Vitals:   01/31/14 2137 02/01/14 0140 02/01/14 0615 02/01/14 0943  BP: 136/84 126/72 138/76 118/67  Pulse: 68 62 71 75  Temp: 98.3 F (36.8 C) 97.7 F (36.5 C) 97.8 F (36.6 C) 98 F (36.7 C)  TempSrc: Oral Oral Oral Oral  Resp: 20 18 18 18   Height:      Weight:   85.957 kg (189 lb 8 oz)   SpO2: 97% 100% 100% 100%   CBG (last 3)  No results found for this basename: GLUCAP,  in the last 72 hours  IV Fluid Intake:     MEDICATIONS  . amLODipine  5 mg Oral Daily  . atorvastatin  10 mg Oral q1800  . clopidogrel  75 mg Oral Q breakfast   PRN:  senna-docusate  Diet:  General thin liquids Activity:  Up with assistance DVT Prophylaxis:  SCDs   CLINICALLY SIGNIFICANT STUDIES Basic Metabolic Panel:   Recent Labs Lab 01/30/14 1359  NA 142  K 4.4  CL 106  CO2 21  GLUCOSE 93  BUN 16  CREATININE 0.88  CALCIUM 9.6   Liver Function Tests:   Recent Labs Lab 01/30/14 1359  AST 31  ALT 14  ALKPHOS 84  BILITOT 2.1*  PROT 7.2  ALBUMIN 4.0   CBC:   Recent Labs Lab 01/30/14 1359  WBC 7.1  NEUTROABS 4.2  HGB 14.5  HCT 43.0  MCV 86.7  PLT 158   Coagulation: No results found for this basename: LABPROT, INR,  in the  last 168 hours Cardiac Enzymes: No results found for this basename: CKTOTAL, CKMB, CKMBINDEX, TROPONINI,  in the last 168 hours Urinalysis:   Recent Labs Lab 01/30/14 1402  COLORURINE YELLOW  LABSPEC 1.024  PHURINE 6.5  GLUCOSEU NEGATIVE  HGBUR NEGATIVE  BILIRUBINUR SMALL*  KETONESUR NEGATIVE  PROTEINUR NEGATIVE  UROBILINOGEN 4.0*  NITRITE NEGATIVE  LEUKOCYTESUR NEGATIVE   Lipid Panel    Component Value Date/Time   CHOL 117 01/31/2014 0600   TRIG 53 01/31/2014 0600   HDL 55 01/31/2014 0600   CHOLHDL 2.1 01/31/2014 0600   VLDL 11 01/31/2014 0600   LDLCALC 51 01/31/2014 0600   HgbA1C  Lab Results  Component Value Date   HGBA1C 5.9* 01/31/2014    Urine Drug Screen:   No results found for this basename: labopia,  cocainscrnur,  labbenz,  amphetmu,  thcu,  labbarb    Alcohol Level: No results found for this basename: ETH,  in the last 168 hours   MRI of the brain  01/30/2014    Acute infarcts in the deep white matter of the left cerebral hemisphere.  Mild intracranial atherosclerotic disease.  MRA of thebrain  01/30/2014   There is a  moderately severe stenosis of the right cavernous carotid and mild irregularity in the left cavernous carotid.  2D Echocardiogram  EF 65% with no source of embolus.   Carotid Doppler  No evidence of hemodynamically significant internal carotid artery stenosis. Vertebral artery flow is antegrade.   CXR  01/31/2014    Mild right basilar airspace opacity may reflect atelectasis or mild pneumonia.  EKG  Sinus rhythm with 1st degree A-V block. For complete results please see formal report.   Therapy Recommendations OP OT, no PT  Physical Exam   Pleasant elderly African American male currently not in distress.Awake alert. Afebrile. Head is nontraumatic. Neck is supple without bruit. Hearing is normal. Cardiac exam no murmur or gallop. Lungs are clear to auscultation. Distal pulses are well felt. Neurological Exam : Awake alert oriented x 3 normal  speech and language. Mild right lower face asymmetry. Tongue midline. No drift. Mild diminished fine finger movements on right with right grip weakness. Orbits left over right upper extremity.  . Normal sensation . Normal coordination.  ASSESSMENT [name] is a 80 y.o. male presenting with right arm weakness.  Imaging confirms left deep white matter infarcts. Infarct felt to be thrombotic secondary to small vessel disease. On aspirin 81 mg orally every day prior to admission. Now on clopidogrel 75 mg orally every day for secondary stroke prevention. Patient with resultant right hemiparesis. Stroke work up completed.  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  OP PT No further stroke workup indicated. Patient has a 10-15% risk of having another stroke over the next year, the highest risk is within 2 weeks of the most recent stroke/TIA (risk of having a stroke following a stroke or TIA is the same). Ongoing risk factor control by Primary Care Physician Stroke Service will sign off. Please call should any needs arise.  Follow up with Dr. Erlinda Hong, [facility], in 2 months.  SIGNED Burnetta Sabin, MSN, RN, ANVP-BC, ANP-BC, GNP-BC [facility] Pager: [phone] 02/01/2014 10:02 AM   I have personally obtained a history, examined the patient, evaluated imaging results, and formulated the assessment and plan of care. I agree with the above.  Antony Contras, MD   To contact Stroke Continuity provider, please refer to [email or web address] After hours, contact General Neurology

## 2014-02-01 NOTE — Progress Notes (Signed)
Talked to patient about Outpatient therapy, patient is agreeable to go to [facility] in [place] for OT therapy; orders and clinical information faxed; the Neuro Rehab will contact the patient with start up date and time for therapy; Aneta Mins [phone]

## 2014-02-01 NOTE — Progress Notes (Signed)
Pt A&O x4; pt discharge education and instructions completed with pt at bedside and pt voices understanding and denies any questions. Pt handed printout information on stroke; pt to pick up plavix prescription from preferred pharmacy; pt IV and telemetry removed. Pt belongings at side and pt waiting on ride to disposition. Pt discharge home with out pt therapy set up by case management RN. Francis Gaines Kuffour RN.

## 2014-02-06 ENCOUNTER — Ambulatory Visit: Payer: PRIVATE HEALTH INSURANCE | Attending: Internal Medicine | Admitting: Occupational Therapy

## 2014-02-06 DIAGNOSIS — M6281 Muscle weakness (generalized): Secondary | ICD-10-CM | POA: Diagnosis not present

## 2014-02-06 DIAGNOSIS — I69998 Other sequelae following unspecified cerebrovascular disease: Secondary | ICD-10-CM | POA: Diagnosis not present

## 2014-02-06 DIAGNOSIS — R279 Unspecified lack of coordination: Secondary | ICD-10-CM | POA: Diagnosis not present

## 2014-02-06 DIAGNOSIS — I619 Nontraumatic intracerebral hemorrhage, unspecified: Secondary | ICD-10-CM | POA: Diagnosis not present

## 2014-02-06 DIAGNOSIS — IMO0001 Reserved for inherently not codable concepts without codable children: Secondary | ICD-10-CM | POA: Diagnosis not present

## 2014-02-11 ENCOUNTER — Ambulatory Visit: Payer: PRIVATE HEALTH INSURANCE | Admitting: Occupational Therapy

## 2014-02-11 DIAGNOSIS — IMO0001 Reserved for inherently not codable concepts without codable children: Secondary | ICD-10-CM | POA: Diagnosis not present

## 2014-02-12 ENCOUNTER — Ambulatory Visit: Payer: PRIVATE HEALTH INSURANCE | Admitting: Occupational Therapy

## 2014-02-12 DIAGNOSIS — IMO0001 Reserved for inherently not codable concepts without codable children: Secondary | ICD-10-CM | POA: Diagnosis not present

## 2014-02-18 ENCOUNTER — Ambulatory Visit: Payer: PRIVATE HEALTH INSURANCE | Attending: Internal Medicine | Admitting: Occupational Therapy

## 2014-02-18 DIAGNOSIS — IMO0001 Reserved for inherently not codable concepts without codable children: Secondary | ICD-10-CM | POA: Diagnosis present

## 2014-02-18 DIAGNOSIS — I619 Nontraumatic intracerebral hemorrhage, unspecified: Secondary | ICD-10-CM | POA: Insufficient documentation

## 2014-02-18 DIAGNOSIS — R279 Unspecified lack of coordination: Secondary | ICD-10-CM | POA: Insufficient documentation

## 2014-02-18 DIAGNOSIS — I69998 Other sequelae following unspecified cerebrovascular disease: Secondary | ICD-10-CM | POA: Diagnosis not present

## 2014-02-18 DIAGNOSIS — M6281 Muscle weakness (generalized): Secondary | ICD-10-CM | POA: Diagnosis not present

## 2014-02-20 ENCOUNTER — Ambulatory Visit: Payer: PRIVATE HEALTH INSURANCE | Admitting: Occupational Therapy

## 2014-02-20 DIAGNOSIS — IMO0001 Reserved for inherently not codable concepts without codable children: Secondary | ICD-10-CM | POA: Diagnosis not present

## 2014-02-25 ENCOUNTER — Ambulatory Visit: Payer: PRIVATE HEALTH INSURANCE | Admitting: Physical Therapy

## 2014-02-25 ENCOUNTER — Ambulatory Visit: Payer: PRIVATE HEALTH INSURANCE | Admitting: Occupational Therapy

## 2014-02-25 DIAGNOSIS — IMO0001 Reserved for inherently not codable concepts without codable children: Secondary | ICD-10-CM | POA: Diagnosis not present

## 2014-02-26 ENCOUNTER — Ambulatory Visit: Payer: PRIVATE HEALTH INSURANCE | Admitting: Physical Therapy

## 2014-02-26 DIAGNOSIS — IMO0001 Reserved for inherently not codable concepts without codable children: Secondary | ICD-10-CM | POA: Diagnosis not present

## 2014-02-28 ENCOUNTER — Ambulatory Visit: Payer: PRIVATE HEALTH INSURANCE | Admitting: Physical Therapy

## 2014-02-28 ENCOUNTER — Ambulatory Visit: Payer: PRIVATE HEALTH INSURANCE | Admitting: Occupational Therapy

## 2014-02-28 DIAGNOSIS — IMO0001 Reserved for inherently not codable concepts without codable children: Secondary | ICD-10-CM | POA: Diagnosis not present

## 2014-03-05 ENCOUNTER — Ambulatory Visit: Payer: PRIVATE HEALTH INSURANCE | Admitting: Physical Therapy

## 2014-03-05 ENCOUNTER — Ambulatory Visit: Payer: PRIVATE HEALTH INSURANCE | Admitting: Occupational Therapy

## 2014-03-05 DIAGNOSIS — IMO0001 Reserved for inherently not codable concepts without codable children: Secondary | ICD-10-CM | POA: Diagnosis not present

## 2014-03-07 ENCOUNTER — Ambulatory Visit: Payer: PRIVATE HEALTH INSURANCE | Admitting: Physical Therapy

## 2014-03-07 ENCOUNTER — Ambulatory Visit: Payer: PRIVATE HEALTH INSURANCE | Admitting: Occupational Therapy

## 2014-03-07 DIAGNOSIS — IMO0001 Reserved for inherently not codable concepts without codable children: Secondary | ICD-10-CM | POA: Diagnosis not present

## 2014-03-11 ENCOUNTER — Ambulatory Visit: Payer: PRIVATE HEALTH INSURANCE | Admitting: Physical Therapy

## 2014-03-11 ENCOUNTER — Ambulatory Visit: Payer: PRIVATE HEALTH INSURANCE | Admitting: Occupational Therapy

## 2014-03-11 DIAGNOSIS — IMO0001 Reserved for inherently not codable concepts without codable children: Secondary | ICD-10-CM | POA: Diagnosis not present

## 2014-03-14 ENCOUNTER — Ambulatory Visit: Payer: PRIVATE HEALTH INSURANCE | Admitting: Physical Therapy

## 2014-03-14 ENCOUNTER — Encounter: Payer: PRIVATE HEALTH INSURANCE | Admitting: Occupational Therapy

## 2014-03-14 DIAGNOSIS — IMO0001 Reserved for inherently not codable concepts without codable children: Secondary | ICD-10-CM | POA: Diagnosis not present

## 2014-03-19 ENCOUNTER — Ambulatory Visit: Payer: PRIVATE HEALTH INSURANCE | Attending: Internal Medicine | Admitting: Physical Therapy

## 2014-03-19 ENCOUNTER — Encounter: Payer: PRIVATE HEALTH INSURANCE | Admitting: Occupational Therapy

## 2014-03-19 DIAGNOSIS — I69998 Other sequelae following unspecified cerebrovascular disease: Secondary | ICD-10-CM | POA: Insufficient documentation

## 2014-03-19 DIAGNOSIS — M6281 Muscle weakness (generalized): Secondary | ICD-10-CM | POA: Insufficient documentation

## 2014-03-19 DIAGNOSIS — I619 Nontraumatic intracerebral hemorrhage, unspecified: Secondary | ICD-10-CM | POA: Diagnosis not present

## 2014-03-19 DIAGNOSIS — R279 Unspecified lack of coordination: Secondary | ICD-10-CM | POA: Diagnosis not present

## 2014-03-19 DIAGNOSIS — IMO0001 Reserved for inherently not codable concepts without codable children: Secondary | ICD-10-CM | POA: Diagnosis present

## 2014-03-21 ENCOUNTER — Ambulatory Visit: Payer: PRIVATE HEALTH INSURANCE | Admitting: Physical Therapy

## 2014-03-21 ENCOUNTER — Encounter: Payer: PRIVATE HEALTH INSURANCE | Admitting: Occupational Therapy

## 2014-03-21 DIAGNOSIS — IMO0001 Reserved for inherently not codable concepts without codable children: Secondary | ICD-10-CM | POA: Diagnosis not present

## 2014-03-26 ENCOUNTER — Ambulatory Visit: Payer: PRIVATE HEALTH INSURANCE | Admitting: Physical Therapy

## 2014-03-26 DIAGNOSIS — IMO0001 Reserved for inherently not codable concepts without codable children: Secondary | ICD-10-CM | POA: Diagnosis not present

## 2014-03-28 ENCOUNTER — Ambulatory Visit: Payer: PRIVATE HEALTH INSURANCE | Admitting: Physical Therapy

## 2014-03-28 DIAGNOSIS — IMO0001 Reserved for inherently not codable concepts without codable children: Secondary | ICD-10-CM | POA: Diagnosis not present

## 2014-04-20 ENCOUNTER — Encounter (HOSPITAL_COMMUNITY): Payer: Self-pay | Admitting: Emergency Medicine

## 2014-04-20 ENCOUNTER — Emergency Department (HOSPITAL_COMMUNITY)
Admission: EM | Admit: 2014-04-20 | Discharge: 2014-04-20 | Disposition: A | Discharge status: Home / Self Care | Payer: PRIVATE HEALTH INSURANCE | Attending: Emergency Medicine | Admitting: Emergency Medicine

## 2014-04-20 ENCOUNTER — Emergency Department (HOSPITAL_COMMUNITY): Payer: PRIVATE HEALTH INSURANCE

## 2014-04-20 DIAGNOSIS — Z79899 Other long term (current) drug therapy: Secondary | ICD-10-CM | POA: Insufficient documentation

## 2014-04-20 DIAGNOSIS — Z7901 Long term (current) use of anticoagulants: Secondary | ICD-10-CM | POA: Insufficient documentation

## 2014-04-20 DIAGNOSIS — Z8739 Personal history of other diseases of the musculoskeletal system and connective tissue: Secondary | ICD-10-CM | POA: Insufficient documentation

## 2014-04-20 DIAGNOSIS — Z8673 Personal history of transient ischemic attack (TIA), and cerebral infarction without residual deficits: Secondary | ICD-10-CM | POA: Diagnosis not present

## 2014-04-20 DIAGNOSIS — R011 Cardiac murmur, unspecified: Secondary | ICD-10-CM | POA: Insufficient documentation

## 2014-04-20 DIAGNOSIS — R42 Dizziness and giddiness: Secondary | ICD-10-CM | POA: Insufficient documentation

## 2014-04-20 DIAGNOSIS — E7800 Pure hypercholesterolemia, unspecified: Secondary | ICD-10-CM | POA: Insufficient documentation

## 2014-04-20 DIAGNOSIS — I10 Essential (primary) hypertension: Secondary | ICD-10-CM | POA: Insufficient documentation

## 2014-04-20 HISTORY — DX: Cerebral infarction, unspecified: I63.9

## 2014-04-20 LAB — I-STAT TROPONIN, ED: Troponin i, poc: 0.04 ng/mL (ref 0.00–0.08)

## 2014-04-20 LAB — DIFFERENTIAL
Basophils Absolute: 0.1 10*3/uL (ref 0.0–0.1)
Basophils Relative: 2 % — ABNORMAL HIGH (ref 0–1)
Eosinophils Absolute: 0.2 10*3/uL (ref 0.0–0.7)
Eosinophils Relative: 3 % (ref 0–5)
LYMPHS ABS: 1.7 10*3/uL (ref 0.7–4.0)
LYMPHS PCT: 26 % (ref 12–46)
MONO ABS: 0.6 10*3/uL (ref 0.1–1.0)
MONOS PCT: 9 % (ref 3–12)
NEUTROS PCT: 60 % (ref 43–77)
Neutro Abs: 4 10*3/uL (ref 1.7–7.7)

## 2014-04-20 LAB — COMPREHENSIVE METABOLIC PANEL
ALT: 13 U/L (ref 0–53)
ANION GAP: 13 (ref 5–15)
AST: 26 U/L (ref 0–37)
Albumin: 3.8 g/dL (ref 3.5–5.2)
Alkaline Phosphatase: 91 U/L (ref 39–117)
BILIRUBIN TOTAL: 1.4 mg/dL — AB (ref 0.3–1.2)
BUN: 12 mg/dL (ref 6–23)
CHLORIDE: 104 meq/L (ref 96–112)
CO2: 22 meq/L (ref 19–32)
Calcium: 9.2 mg/dL (ref 8.4–10.5)
Creatinine, Ser: 0.84 mg/dL (ref 0.50–1.35)
GFR calc Af Amer: >90 mL/min (ref >90)
GFR, EST NON AFRICAN AMERICAN: 80 mL/min — AB (ref >90)
Glucose, Bld: 105 mg/dL — ABNORMAL HIGH (ref 70–99)
POTASSIUM: 3.9 meq/L (ref 3.7–5.3)
Sodium: 139 mEq/L (ref 137–147)
Total Protein: 7.4 g/dL (ref 6.0–8.3)

## 2014-04-20 LAB — CBC
HEMATOCRIT: 43.4 % (ref 39.0–52.0)
Hemoglobin: 14.6 g/dL (ref 13.0–17.0)
MCH: 28.4 pg (ref 26.0–34.0)
MCHC: 33.6 g/dL (ref 30.0–36.0)
MCV: 84.4 fL (ref 78.0–100.0)
Platelets: 154 10*3/uL (ref 150–400)
RBC: 5.14 MIL/uL (ref 4.22–5.81)
RDW: 14.4 % (ref 11.5–15.5)
WBC: 6.5 10*3/uL (ref 4.0–10.5)

## 2014-04-20 LAB — PROTIME-INR
INR: 1.15 (ref 0.00–1.49)
Prothrombin Time: 14.7 seconds (ref 11.6–15.2)

## 2014-04-20 LAB — APTT: APTT: 36 s (ref 24–37)

## 2014-04-20 NOTE — ED Notes (Signed)
Pt reports hx of cva two months ago. First noticed last night at he felt "swimmy headed" and like the room was spinning. Denies n/v. Has some right side deficits from previous stroke.

## 2014-04-20 NOTE — ED Notes (Signed)
Patient returned from CT

## 2014-04-20 NOTE — ED Notes (Signed)
Patient transported to CT 

## 2014-04-20 NOTE — Discharge Instructions (Signed)

## 2014-04-20 NOTE — ED Provider Notes (Signed)
CSN: [id number]     Arrival date & time 04/20/14  1700 History   First MD Initiated Contact with Patient 04/20/14 1721     Chief Complaint  Patient presents with  . Dizziness     (Consider location/radiation/quality/duration/timing/severity/associated sxs/prior Treatment) Patient is a 80 y.o. male presenting with dizziness.  Dizziness Quality:  Head spinning Severity:  Moderate Onset quality:  Sudden Duration: less than two minutes. Timing:  Intermittent Progression:  Resolved Chronicity:  Recurrent (similar symptoms several years ago.  More recently, has had similar symptoms a few seperate times over past few days.  ) Context comment:  Sitting up suddenly from reclined position.   Relieved by: lying down  Associated symptoms: no vomiting     Past Medical History  Diagnosis Date  . Hypertension   . Hypercholesteremia   . Murmur, cardiac   . Arthritis   . Stroke    History reviewed. No pertinent past surgical history. History reviewed. No pertinent family history. History  Substance Use Topics  . Smoking status: Never Smoker   . Smokeless tobacco: Never Used  . Alcohol Use: No    Review of Systems  Gastrointestinal: Negative for vomiting.  Neurological: Positive for dizziness.  All other systems reviewed and are negative.     Allergies  Sulfa antibiotics and Ramipril  Home Medications   Prior to Admission medications   Medication Sig Start Date End Date Taking? Authorizing Provider  amLODipine (NORVASC) 5 MG tablet Take 5 mg by mouth daily.    Historical Provider, MD  clopidogrel (PLAVIX) 75 MG tablet Take 1 tablet (75 mg total) by mouth daily with breakfast. 02/01/14   Debbe Odea, MD  PRESCRIPTION MEDICATION Place 1 drop into both eyes at bedtime.    Historical Provider, MD  rosuvastatin (CRESTOR) 5 MG tablet Take 5 mg by mouth at bedtime.    Historical Provider, MD   BP 138/85  Pulse 93  Temp(Src) 98.7 F (37.1 C) (Oral)  Resp 18  Ht 5\' 11"  (1.803 m)   Wt 185 lb (83.915 kg)  BMI 25.81 kg/m2  SpO2 99% Physical Exam  Nursing note and vitals reviewed. Constitutional: He is oriented to person, place, and time. He appears well-developed and well-nourished. No distress.  HENT:  Head: Normocephalic and atraumatic.  Eyes: Conjunctivae are normal. No scleral icterus.  Neck: Neck supple.  Cardiovascular: Normal rate and intact distal pulses.   Pulmonary/Chest: Effort normal. No stridor. No respiratory distress.  Abdominal: Normal appearance. He exhibits no distension.  Neurological: He is alert and oriented to person, place, and time. A cranial nerve deficit (mild right facial drooping at rest) is present. Coordination (limited on right secondary to RUE weakness) abnormal. Gait (steady gait without assistance) normal. GCS eye subscore is 4. GCS verbal subscore is 5. GCS motor subscore is 6.  4/5 Strength RUE, 5/5 strength RLE, 5/5 LUE and LLE.    No nystagmus.  Skin: Skin is warm and dry. No rash noted.  Psychiatric: He has a normal mood and affect. His behavior is normal.    ED Course  Procedures (including critical care time) Labs Review Labs Reviewed  DIFFERENTIAL - Abnormal; Notable for the following:    Basophils Relative 2 (*)    All other components within normal limits  COMPREHENSIVE METABOLIC PANEL - Abnormal; Notable for the following:    Glucose, Bld 105 (*)    Total Bilirubin 1.4 (*)    GFR calc non Af Amer 80 (*)    All other  components within normal limits  PROTIME-INR  APTT  CBC  I-STAT TROPOININ, ED    Imaging Review Ct Head Wo Contrast  04/20/2014   CLINICAL DATA:  Dizziness.  Recent stroke 2 months ago.  EXAM: CT HEAD WITHOUT CONTRAST  TECHNIQUE: Contiguous axial images were obtained from the base of the skull through the vertex without intravenous contrast.  COMPARISON:  Head MRI 01/30/2014  FINDINGS: Focal hypodensities extending from the left centrum semiovale through the corona radiata and into the left  external capsule correspond to new infarcts seen on prior MRI. Patchy hypodensities elsewhere in the subcortical and deep cerebral white matter are nonspecific but compatible with mild to moderate chronic small vessel ischemic disease. There is no evidence of acute cortical infarct, intracranial hemorrhage, mass, midline shift, or extra-axial fluid collection.  Orbits are unremarkable. Mastoid air cells and paranasal sinuses are clear. Left maxillary sinus wall thickening is partially visualized and suggestive of chronic sinusitis.  IMPRESSION: 1. No acute intracranial abnormality identified. 2. Mild to moderate chronic small vessel ischemic disease and old infarcts in the deep cerebral white matter on the left.   Electronically Signed   By: Logan Bores   On: 04/20/2014 20:39  All radiology studies independently viewed by me.      EKG Interpretation   Date/Time:  Saturday April 20 2014 17:10:17 EDT Ventricular Rate:  93 PR Interval:  222 QRS Duration: 84 QT Interval:  380 QTC Calculation: 472 R Axis:   63 Text Interpretation:  Sinus rhythm with 1st degree A-V block Septal  infarct , age undetermined Abnormal ECG No significant change was found  Confirmed by [facility]  MD, TREY ([id number]) on 04/20/2014 6:50:25 PM      MDM   Final diagnoses:  Vertigo    80 yo male with hx of stroke two months ago presenting with intermittent vertigo.  Last episode was about 12 hours ago.  He is asymptomatic now.  MR imaging at time of his last stroke revealed patent posterior vasculature.  He reports compliance with his medications, including plavix.  Based on his exam and intermittent and short lived nature of his symptoms, I have a low suspicion for new stroke.  He is already on secondary stroke prevention.    His ED workup was unremarkable.  He did not have any symptoms during his ED course.  I offered MR imaging to further rule out stroke, but pt declined. He will follow up with his PCP and neurologist.     Arbie Cookey, MD 04/20/14 [phone]

## 2014-04-20 NOTE — ED Notes (Signed)
Called CT for time frame; 2 other patient still ahead of him, but they are aware.

## 2014-04-30 ENCOUNTER — Ambulatory Visit (HOSPITAL_COMMUNITY)
Admission: RE | Admit: 2014-04-30 | Discharge: 2014-04-30 | Disposition: A | Discharge status: Home / Self Care | Payer: PRIVATE HEALTH INSURANCE | Source: Ambulatory Visit | Attending: Internal Medicine | Admitting: Internal Medicine

## 2014-04-30 ENCOUNTER — Other Ambulatory Visit: Payer: Self-pay | Admitting: Internal Medicine

## 2014-04-30 DIAGNOSIS — M25476 Effusion, unspecified foot: Secondary | ICD-10-CM | POA: Diagnosis not present

## 2014-04-30 DIAGNOSIS — M25474 Effusion, right foot: Secondary | ICD-10-CM

## 2014-04-30 DIAGNOSIS — M7730 Calcaneal spur, unspecified foot: Secondary | ICD-10-CM | POA: Insufficient documentation

## 2014-04-30 DIAGNOSIS — M79609 Pain in unspecified limb: Secondary | ICD-10-CM | POA: Insufficient documentation

## 2014-04-30 DIAGNOSIS — M25471 Effusion, right ankle: Secondary | ICD-10-CM

## 2014-04-30 DIAGNOSIS — M25571 Pain in right ankle and joints of right foot: Secondary | ICD-10-CM

## 2014-04-30 DIAGNOSIS — M2010 Hallux valgus (acquired), unspecified foot: Secondary | ICD-10-CM | POA: Insufficient documentation

## 2014-04-30 DIAGNOSIS — M79671 Pain in right foot: Secondary | ICD-10-CM

## 2014-04-30 DIAGNOSIS — M25579 Pain in unspecified ankle and joints of unspecified foot: Secondary | ICD-10-CM | POA: Diagnosis present

## 2014-04-30 DIAGNOSIS — M25473 Effusion, unspecified ankle: Secondary | ICD-10-CM | POA: Diagnosis not present

## 2014-05-03 ENCOUNTER — Other Ambulatory Visit (HOSPITAL_COMMUNITY): Payer: Self-pay | Admitting: Internal Medicine

## 2014-05-03 DIAGNOSIS — I739 Peripheral vascular disease, unspecified: Secondary | ICD-10-CM

## 2014-05-03 DIAGNOSIS — L98499 Non-pressure chronic ulcer of skin of other sites with unspecified severity: Principal | ICD-10-CM

## 2014-05-06 ENCOUNTER — Ambulatory Visit (HOSPITAL_COMMUNITY)
Admission: RE | Admit: 2014-05-06 | Discharge: 2014-05-06 | Disposition: A | Discharge status: Home / Self Care | Payer: PRIVATE HEALTH INSURANCE | Source: Ambulatory Visit | Attending: Internal Medicine | Admitting: Internal Medicine

## 2014-05-06 DIAGNOSIS — I70219 Atherosclerosis of native arteries of extremities with intermittent claudication, unspecified extremity: Secondary | ICD-10-CM | POA: Insufficient documentation

## 2014-05-06 DIAGNOSIS — L98499 Non-pressure chronic ulcer of skin of other sites with unspecified severity: Secondary | ICD-10-CM

## 2014-05-06 DIAGNOSIS — I739 Peripheral vascular disease, unspecified: Secondary | ICD-10-CM

## 2014-05-06 NOTE — Progress Notes (Signed)
VASCULAR LAB PRELIMINARY  PRELIMINARY  PRELIMINARY  PRELIMINARY  Bilateral lower extremity arterial duplex completed.    Preliminary report:  ABIs:  Right:  0.77  Left:  0.85  Bilateral 50-99% stenosis at the mid to distal FA.   Travelle Mcclimans, RVT 05/06/2014, 4:23 PM

## 2014-05-09 ENCOUNTER — Ambulatory Visit (HOSPITAL_COMMUNITY)
Admission: RE | Admit: 2014-05-09 | Discharge: 2014-05-09 | Disposition: A | Discharge status: Home / Self Care | Payer: PRIVATE HEALTH INSURANCE | Source: Ambulatory Visit | Attending: Internal Medicine | Admitting: Internal Medicine

## 2014-05-09 ENCOUNTER — Other Ambulatory Visit (HOSPITAL_COMMUNITY): Payer: Self-pay | Admitting: Internal Medicine

## 2014-05-09 DIAGNOSIS — R609 Edema, unspecified: Secondary | ICD-10-CM | POA: Insufficient documentation

## 2014-05-09 DIAGNOSIS — M79604 Pain in right leg: Secondary | ICD-10-CM

## 2014-05-09 DIAGNOSIS — M7989 Other specified soft tissue disorders: Secondary | ICD-10-CM

## 2014-05-09 NOTE — Progress Notes (Signed)
VASCULAR LAB PRELIMINARY  PRELIMINARY  PRELIMINARY  PRELIMINARY  Right lower extremity venous duplex completed.    Preliminary report:  Right:  No evidence of DVT, superficial thrombosis, or Baker's cyst.  Levis Nazir, RVS 05/09/2014, 6:43 PM

## 2014-10-02 ENCOUNTER — Encounter (HOSPITAL_COMMUNITY): Payer: Self-pay | Admitting: *Deleted

## 2014-10-02 ENCOUNTER — Inpatient Hospital Stay (HOSPITAL_COMMUNITY)
Admission: EM | Admit: 2014-10-02 | Discharge: 2014-10-04 | DRG: 378 | Disposition: A | Discharge status: Home / Self Care | Payer: Medicare Other | Attending: Gastroenterology | Admitting: Gastroenterology

## 2014-10-02 DIAGNOSIS — I69951 Hemiplegia and hemiparesis following unspecified cerebrovascular disease affecting right dominant side: Secondary | ICD-10-CM

## 2014-10-02 DIAGNOSIS — I10 Essential (primary) hypertension: Secondary | ICD-10-CM | POA: Diagnosis present

## 2014-10-02 DIAGNOSIS — D120 Benign neoplasm of cecum: Secondary | ICD-10-CM | POA: Diagnosis present

## 2014-10-02 DIAGNOSIS — E780 Pure hypercholesterolemia: Secondary | ICD-10-CM | POA: Diagnosis present

## 2014-10-02 DIAGNOSIS — D62 Acute posthemorrhagic anemia: Secondary | ICD-10-CM

## 2014-10-02 DIAGNOSIS — Z882 Allergy status to sulfonamides status: Secondary | ICD-10-CM

## 2014-10-02 DIAGNOSIS — Z8673 Personal history of transient ischemic attack (TIA), and cerebral infarction without residual deficits: Secondary | ICD-10-CM | POA: Insufficient documentation

## 2014-10-02 DIAGNOSIS — K5731 Diverticulosis of large intestine without perforation or abscess with bleeding: Principal | ICD-10-CM | POA: Diagnosis present

## 2014-10-02 DIAGNOSIS — M1990 Unspecified osteoarthritis, unspecified site: Secondary | ICD-10-CM | POA: Diagnosis present

## 2014-10-02 DIAGNOSIS — K2900 Acute gastritis without bleeding: Secondary | ICD-10-CM | POA: Diagnosis present

## 2014-10-02 DIAGNOSIS — K922 Gastrointestinal hemorrhage, unspecified: Secondary | ICD-10-CM | POA: Diagnosis present

## 2014-10-02 DIAGNOSIS — Z7901 Long term (current) use of anticoagulants: Secondary | ICD-10-CM

## 2014-10-02 DIAGNOSIS — K5900 Constipation, unspecified: Secondary | ICD-10-CM | POA: Diagnosis present

## 2014-10-02 DIAGNOSIS — Z79899 Other long term (current) drug therapy: Secondary | ICD-10-CM

## 2014-10-02 DIAGNOSIS — E785 Hyperlipidemia, unspecified: Secondary | ICD-10-CM | POA: Diagnosis present

## 2014-10-02 DIAGNOSIS — K921 Melena: Secondary | ICD-10-CM

## 2014-10-02 DIAGNOSIS — Z7902 Long term (current) use of antithrombotics/antiplatelets: Secondary | ICD-10-CM

## 2014-10-02 DIAGNOSIS — Z888 Allergy status to other drugs, medicaments and biological substances status: Secondary | ICD-10-CM

## 2014-10-02 DIAGNOSIS — R011 Cardiac murmur, unspecified: Secondary | ICD-10-CM | POA: Diagnosis present

## 2014-10-02 LAB — COMPREHENSIVE METABOLIC PANEL
ALBUMIN: 3.9 g/dL (ref 3.5–5.2)
ALK PHOS: 69 U/L (ref 39–117)
ALT: 15 U/L (ref 0–53)
AST: 33 U/L (ref 0–37)
Anion gap: 4 — ABNORMAL LOW (ref 5–15)
BUN: 14 mg/dL (ref 6–23)
CO2: 28 mmol/L (ref 19–32)
Calcium: 9 mg/dL (ref 8.4–10.5)
Chloride: 106 mmol/L (ref 96–112)
Creatinine, Ser: 0.96 mg/dL (ref 0.50–1.35)
GFR calc Af Amer: 87 mL/min — ABNORMAL LOW (ref >90)
GFR, EST NON AFRICAN AMERICAN: 75 mL/min — AB (ref >90)
Glucose, Bld: 119 mg/dL — ABNORMAL HIGH (ref 70–99)
POTASSIUM: 4.2 mmol/L (ref 3.5–5.1)
SODIUM: 138 mmol/L (ref 135–145)
Total Bilirubin: 1.5 mg/dL — ABNORMAL HIGH (ref 0.3–1.2)
Total Protein: 6.3 g/dL (ref 6.0–8.3)

## 2014-10-02 LAB — CBC WITH DIFFERENTIAL/PLATELET
BASOS ABS: 0 10*3/uL (ref 0.0–0.1)
Basophils Relative: 1 % (ref 0–1)
EOS ABS: 0.1 10*3/uL (ref 0.0–0.7)
Eosinophils Relative: 2 % (ref 0–5)
HCT: 35.3 % — ABNORMAL LOW (ref 39.0–52.0)
Hemoglobin: 11.5 g/dL — ABNORMAL LOW (ref 13.0–17.0)
Lymphocytes Relative: 26 % (ref 12–46)
Lymphs Abs: 1.8 10*3/uL (ref 0.7–4.0)
MCH: 28.3 pg (ref 26.0–34.0)
MCHC: 32.6 g/dL (ref 30.0–36.0)
MCV: 86.9 fL (ref 78.0–100.0)
Monocytes Absolute: 0.4 10*3/uL (ref 0.1–1.0)
Monocytes Relative: 6 % (ref 3–12)
NEUTROS PCT: 65 % (ref 43–77)
Neutro Abs: 4.4 10*3/uL (ref 1.7–7.7)
PLATELETS: 153 10*3/uL (ref 150–400)
RBC: 4.06 MIL/uL — AB (ref 4.22–5.81)
RDW: 14.9 % (ref 11.5–15.5)
WBC: 6.6 10*3/uL (ref 4.0–10.5)

## 2014-10-02 LAB — PROTIME-INR
INR: 1.14 (ref 0.00–1.49)
PROTHROMBIN TIME: 14.7 s (ref 11.6–15.2)

## 2014-10-02 NOTE — ED Notes (Signed)
Pt reports dark red rectal bleeding x 5 days. Pt taking plavix. No acute distress noted at this time.

## 2014-10-02 NOTE — ED Provider Notes (Signed)
CSN: [id number]     Arrival date & time 10/02/14  1825 History   First MD Initiated Contact with Patient 10/02/14 2134     Chief Complaint  Patient presents with  . Rectal Bleeding     (Consider location/radiation/quality/duration/timing/severity/associated sxs/prior Treatment) HPI Comments: 81 yo M with PMHx of HTN, HLD, CVA, on plavix, with h/o GIB 2/2 diverticulosis who p/w a 5-day history of black, intermittently maroon-colored stool. Pt presented to PCP for these sx earlier today, who sent him to ED for evaluation. Denies any bright red blood per rectum. No associated abdominal pain or nausea. He did take his Plavix this AM.  Patient is a 81 y.o. male presenting with GI illness. The history is provided by the patient.  GI Problem This is a new problem. The current episode started in the past 7 days. The problem occurs constantly. The problem has been unchanged. Pertinent negatives include no abdominal pain, chest pain, congestion, coughing, diaphoresis, fatigue, fever, headaches, nausea, neck pain, rash, vomiting or weakness. Nothing aggravates the symptoms. He has tried nothing for the symptoms.    Past Medical History  Diagnosis Date  . Hypertension   . Hypercholesteremia   . Murmur, cardiac   . Arthritis   . Stroke    History reviewed. No pertinent past surgical history. History reviewed. No pertinent family history. History  Substance Use Topics  . Smoking status: Never Smoker   . Smokeless tobacco: Never Used  . Alcohol Use: No    Review of Systems  Constitutional: Negative for fever, diaphoresis and fatigue.  HENT: Negative for congestion.   Respiratory: Negative for cough, shortness of breath and wheezing.   Cardiovascular: Negative for chest pain.  Gastrointestinal: Positive for blood in stool. Negative for nausea, vomiting and abdominal pain.  Musculoskeletal: Negative for neck pain and neck stiffness.  Skin: Negative for rash.  Neurological: Negative for  dizziness, weakness, light-headedness and headaches.  All other systems reviewed and are negative.     Allergies  Sulfa antibiotics and Ramipril  Home Medications   Prior to Admission medications   Medication Sig Start Date End Date Taking? Authorizing Provider  amLODipine (NORVASC) 5 MG tablet Take 5 mg by mouth daily.   Yes Historical Provider, MD  brimonidine (ALPHAGAN) 0.2 % ophthalmic solution Place 1 drop into both eyes at bedtime.  03/22/14  Yes Historical Provider, MD  rosuvastatin (CRESTOR) 5 MG tablet Take 5 mg by mouth at bedtime.   Yes Historical Provider, MD  clopidogrel (PLAVIX) 75 MG tablet Take 75 mg by mouth daily.    Historical Provider, MD   BP 122/68 mmHg  Pulse 78  Temp(Src) 98.1 F (36.7 C) (Oral)  Resp 17  SpO2 98% Physical Exam  Constitutional: He is oriented to person, place, and time. He appears well-developed and well-nourished. No distress.  HENT:  Head: Normocephalic and atraumatic.  Mouth/Throat: No oropharyngeal exudate.  Eyes: Conjunctivae are normal. Pupils are equal, round, and reactive to light.  Neck: Normal range of motion. Neck supple.  Cardiovascular: Normal rate, normal heart sounds and intact distal pulses.  Exam reveals no friction rub.   No murmur heard. Pulmonary/Chest: Effort normal and breath sounds normal. No respiratory distress. He has no wheezes. He has no rales.  Abdominal: Soft. Bowel sounds are normal. He exhibits no distension. There is no tenderness.  Genitourinary: Rectal exam shows no external hemorrhoid, no internal hemorrhoid and anal tone normal. Guaiac positive stool.  Dark, maroon-colored stool, soft, in rectal vault. No bright  red blood or signs of active bleeding.  Musculoskeletal: He exhibits no tenderness.  Neurological: He is alert and oriented to person, place, and time.  Skin: Skin is warm. No rash noted.  Nursing note and vitals reviewed.   ED Course  Procedures (including critical care time) Labs  Review Labs Reviewed  CBC WITH DIFFERENTIAL/PLATELET - Abnormal; Notable for the following:    RBC 4.06 (*)    Hemoglobin 11.5 (*)    HCT 35.3 (*)    All other components within normal limits  COMPREHENSIVE METABOLIC PANEL - Abnormal; Notable for the following:    Glucose, Bld 119 (*)    Total Bilirubin 1.5 (*)    GFR calc non Af Amer 75 (*)    GFR calc Af Amer 87 (*)    Anion gap 4 (*)    All other components within normal limits  POC OCCULT BLOOD, ED - Abnormal; Notable for the following:    Fecal Occult Bld POSITIVE (*)    All other components within normal limits  PROTIME-INR    Imaging Review No results found.   EKG Interpretation None      MDM   Final diagnoses:  Melena  Acute blood loss anemia  Anticoagulated    80 yo M with PMHx of HTN, HLD, CVA, on plavix, with h/o GIB 2/2 diverticulosis who p/w a 5-day history of black, intermittently maroon-colored stool. See HPI above. On arrival, T 98.56F< hR 99, RR 16, BP 127/74, satting 98% on RA. Exam as above, pt overall well-appearing and in NAD. Maroon, heme+ stools noted in rectal vault with no active or bright red bleeding.  Pt's presentation is most c/f GIB in setting of anticoagulant use. Pt denies any associated pain. He is on plavix but generally denies the use of any NSAIDs, alcohol, or other risk factors for UGI bleed and has had no hematemesis. He does have a h/o diverticulosis but has had no BRBPR. Suspect slow UGI versus LGI bleed. Pt HDS at this time with no sx of anemia - no CP, SOB, lightheadedness, etc. Will send labs, plan for admission and discussion with GI.  CBC with Hgb 11.5, down from >14 in September 2015, c/w blood loss anemia. CMP normal and INR 1.14. Will admit to SDU for ongoing GIB with acute anemia. Discussed with GI medicine, who will evaluate in AM. VSS.  Clinical Impression: 1. Melena   2. Acute blood loss anemia   3. Anticoagulated     Disposition: Admit  Condition: Stable  Pt seen  in conjunction with Dr. Hassan Buckler, MD 10/03/14 3559  Varney Biles, MD 10/04/14 7416

## 2014-10-03 ENCOUNTER — Encounter (HOSPITAL_COMMUNITY)
Admission: EM | Disposition: A | Discharge status: Home / Self Care | Payer: Self-pay | Source: Home / Self Care | Attending: Gastroenterology

## 2014-10-03 ENCOUNTER — Encounter (HOSPITAL_COMMUNITY): Payer: Self-pay | Admitting: Internal Medicine

## 2014-10-03 DIAGNOSIS — Z7902 Long term (current) use of antithrombotics/antiplatelets: Secondary | ICD-10-CM | POA: Diagnosis not present

## 2014-10-03 DIAGNOSIS — Z882 Allergy status to sulfonamides status: Secondary | ICD-10-CM | POA: Diagnosis not present

## 2014-10-03 DIAGNOSIS — K5900 Constipation, unspecified: Secondary | ICD-10-CM | POA: Diagnosis present

## 2014-10-03 DIAGNOSIS — K921 Melena: Secondary | ICD-10-CM | POA: Diagnosis present

## 2014-10-03 DIAGNOSIS — K922 Gastrointestinal hemorrhage, unspecified: Secondary | ICD-10-CM | POA: Diagnosis present

## 2014-10-03 DIAGNOSIS — Z79899 Other long term (current) drug therapy: Secondary | ICD-10-CM | POA: Diagnosis not present

## 2014-10-03 DIAGNOSIS — K2961 Other gastritis with bleeding: Secondary | ICD-10-CM

## 2014-10-03 DIAGNOSIS — R011 Cardiac murmur, unspecified: Secondary | ICD-10-CM | POA: Diagnosis present

## 2014-10-03 DIAGNOSIS — I10 Essential (primary) hypertension: Secondary | ICD-10-CM | POA: Diagnosis present

## 2014-10-03 DIAGNOSIS — M1990 Unspecified osteoarthritis, unspecified site: Secondary | ICD-10-CM | POA: Diagnosis present

## 2014-10-03 DIAGNOSIS — Z8673 Personal history of transient ischemic attack (TIA), and cerebral infarction without residual deficits: Secondary | ICD-10-CM | POA: Insufficient documentation

## 2014-10-03 DIAGNOSIS — K2900 Acute gastritis without bleeding: Secondary | ICD-10-CM | POA: Diagnosis present

## 2014-10-03 DIAGNOSIS — I69951 Hemiplegia and hemiparesis following unspecified cerebrovascular disease affecting right dominant side: Secondary | ICD-10-CM | POA: Diagnosis not present

## 2014-10-03 DIAGNOSIS — E785 Hyperlipidemia, unspecified: Secondary | ICD-10-CM | POA: Diagnosis not present

## 2014-10-03 DIAGNOSIS — Z888 Allergy status to other drugs, medicaments and biological substances status: Secondary | ICD-10-CM | POA: Diagnosis not present

## 2014-10-03 DIAGNOSIS — K5731 Diverticulosis of large intestine without perforation or abscess with bleeding: Secondary | ICD-10-CM | POA: Diagnosis present

## 2014-10-03 DIAGNOSIS — E780 Pure hypercholesterolemia: Secondary | ICD-10-CM | POA: Diagnosis present

## 2014-10-03 DIAGNOSIS — D120 Benign neoplasm of cecum: Secondary | ICD-10-CM | POA: Diagnosis present

## 2014-10-03 HISTORY — PX: ESOPHAGOGASTRODUODENOSCOPY: SHX5428

## 2014-10-03 LAB — CBC
HCT: 32.7 % — ABNORMAL LOW (ref 39.0–52.0)
HCT: 33.5 % — ABNORMAL LOW (ref 39.0–52.0)
HEMATOCRIT: 31.4 % — AB (ref 39.0–52.0)
HEMATOCRIT: 31.9 % — AB (ref 39.0–52.0)
HEMOGLOBIN: 10.6 g/dL — AB (ref 13.0–17.0)
HEMOGLOBIN: 11 g/dL — AB (ref 13.0–17.0)
Hemoglobin: 10.4 g/dL — ABNORMAL LOW (ref 13.0–17.0)
Hemoglobin: 10.7 g/dL — ABNORMAL LOW (ref 13.0–17.0)
MCH: 28.1 pg (ref 26.0–34.0)
MCH: 28.2 pg (ref 26.0–34.0)
MCH: 27.9 pg (ref 26.0–34.0)
MCH: 28.1 pg (ref 26.0–34.0)
MCHC: 33.1 g/dL (ref 30.0–36.0)
MCHC: 33.2 g/dL (ref 30.0–36.0)
MCHC: 32.7 g/dL (ref 30.0–36.0)
MCHC: 32.8 g/dL (ref 30.0–36.0)
MCV: 84.9 fL (ref 78.0–100.0)
MCV: 84.8 fL (ref 78.0–100.0)
MCV: 85.4 fL (ref 78.0–100.0)
MCV: 85.7 fL (ref 78.0–100.0)
Platelets: 146 10*3/uL — ABNORMAL LOW (ref 150–400)
Platelets: 141 10*3/uL — ABNORMAL LOW (ref 150–400)
Platelets: 150 10*3/uL (ref 150–400)
Platelets: 146 10*3/uL — ABNORMAL LOW (ref 150–400)
RBC: 3.7 MIL/uL — ABNORMAL LOW (ref 4.22–5.81)
RBC: 3.76 MIL/uL — ABNORMAL LOW (ref 4.22–5.81)
RBC: 3.83 MIL/uL — ABNORMAL LOW (ref 4.22–5.81)
RBC: 3.91 MIL/uL — ABNORMAL LOW (ref 4.22–5.81)
RDW: 14.8 % (ref 11.5–15.5)
RDW: 14.7 % (ref 11.5–15.5)
RDW: 14.9 % (ref 11.5–15.5)
RDW: 14.9 % (ref 11.5–15.5)
WBC: 5.3 10*3/uL (ref 4.0–10.5)
WBC: 5.4 10*3/uL (ref 4.0–10.5)
WBC: 6.1 10*3/uL (ref 4.0–10.5)
WBC: 5.7 10*3/uL (ref 4.0–10.5)

## 2014-10-03 LAB — MRSA PCR SCREENING: MRSA BY PCR: NEGATIVE

## 2014-10-03 LAB — POC URINE PREG, ED: Preg Test, Ur: POSITIVE — AB

## 2014-10-03 LAB — POC OCCULT BLOOD, ED: FECAL OCCULT BLD: POSITIVE — AB

## 2014-10-03 SURGERY — EGD (ESOPHAGOGASTRODUODENOSCOPY)
Anesthesia: Moderate Sedation

## 2014-10-03 MED ORDER — DIPHENHYDRAMINE HCL 50 MG/ML IJ SOLN
INTRAMUSCULAR | Status: AC
  Filled 2014-10-03: qty 1

## 2014-10-03 MED ORDER — BRIMONIDINE TARTRATE 0.2 % OP SOLN
1.0000 [drp] | Freq: Every day | OPHTHALMIC | Status: DC
  Administered 2014-10-03: 1 [drp] via OPHTHALMIC
  Filled 2014-10-03: qty 5

## 2014-10-03 MED ORDER — FENTANYL CITRATE 0.05 MG/ML IJ SOLN
INTRAMUSCULAR | Status: DC | PRN
  Administered 2014-10-03: 25 ug via INTRAVENOUS

## 2014-10-03 MED ORDER — DEXTROSE-NACL 5-0.9 % IV SOLN
INTRAVENOUS | Status: DC
  Administered 2014-10-03 – 2014-10-04 (×2): via INTRAVENOUS

## 2014-10-03 MED ORDER — SODIUM CHLORIDE 0.9 % IV SOLN
8.0000 mg/h | INTRAVENOUS | Status: DC
  Administered 2014-10-03 – 2014-10-04 (×2): 8 mg/h via INTRAVENOUS
  Filled 2014-10-03 (×8): qty 80

## 2014-10-03 MED ORDER — MIDAZOLAM HCL 5 MG/ML IJ SOLN
INTRAMUSCULAR | Status: AC
  Filled 2014-10-03: qty 2

## 2014-10-03 MED ORDER — SODIUM CHLORIDE 0.9 % IV SOLN
80.0000 mg | Freq: Once | INTRAVENOUS | Status: AC
  Administered 2014-10-03: 80 mg via INTRAVENOUS
  Filled 2014-10-03: qty 80

## 2014-10-03 MED ORDER — PANTOPRAZOLE SODIUM 40 MG IV SOLR: 40.0000 mg | Freq: Two times a day (BID) | INTRAVENOUS | Status: DC

## 2014-10-03 MED ORDER — FENTANYL CITRATE 0.05 MG/ML IJ SOLN
INTRAMUSCULAR | Status: AC
  Filled 2014-10-03: qty 2

## 2014-10-03 MED ORDER — SODIUM CHLORIDE 0.9 % IJ SOLN
3.0000 mL | Freq: Two times a day (BID) | INTRAMUSCULAR | Status: DC
  Administered 2014-10-04: 3 mL via INTRAVENOUS

## 2014-10-03 MED ORDER — MIDAZOLAM HCL 10 MG/2ML IJ SOLN
INTRAMUSCULAR | Status: DC | PRN
  Administered 2014-10-03: 2 mg via INTRAVENOUS
  Administered 2014-10-03: 1 mg via INTRAVENOUS

## 2014-10-03 MED ORDER — PEG 3350-KCL-NA BICARB-NACL 420 G PO SOLR
4000.0000 mL | Freq: Once | ORAL | Status: DC
  Filled 2014-10-03: qty 4000

## 2014-10-03 MED ORDER — SODIUM CHLORIDE 0.9 % IV SOLN
INTRAVENOUS | Status: DC
Start: 2014-10-03 — End: 2014-10-04
  Administered 2014-10-04: 10:00:00 via INTRAVENOUS

## 2014-10-03 MED ORDER — ROSUVASTATIN CALCIUM 5 MG PO TABS
5.0000 mg | ORAL_TABLET | Freq: Every day | ORAL | Status: DC
  Filled 2014-10-03 (×2): qty 1

## 2014-10-03 NOTE — Op Note (Addendum)
[facility] [place], [address]   ENDOSCOPY PROCEDURE REPORT  PATIENT: Gregory, [name]  MR#: [id number] BIRTHDATE: 09/18/1929 , 84  yrs. old GENDER: male ENDOSCOPIST:Giavana Rooke Oletta Lamas, MD REFERRED BY: Jeanann Lewandowsky, M.D. PROCEDURE DATE:  10/03/2014 PROCEDURE:   EGD, diagnostic ASA CLASS:    Class III INDICATIONS: G.I.  bleeding and gentlemen on antiplatelet agents. MEDICATION: Fentanyl 25 mcg IV and Versed 3 mg IV TOPICAL ANESTHETIC:   Cetacaine Spray  DESCRIPTION OF PROCEDURE:   After the risks and benefits of the procedure were explained, informed consent was obtained.  The Pentax Gastroscope [id number]  endoscope was introduced through the mouth  and advanced to the second portion of the duodenum .  The instrument was slowly withdrawn as the mucosa was fully examined.      ESOPHAGUS: The mucosa of the esophagus appeared normal.  STOMACH: Acute gastritis (inflammation) was found.   this was mild and the stomach was otherwise normal.  DUODENUM: The duodenal mucosa showed no abnormalities. Retroflexed views revealed no abnormalities.    The scope was then withdrawn from the patient and the procedure completed.  COMPLICATIONS: There were no immediate complications.  ENDOSCOPIC IMPRESSION: 1.   The mucosa of the esophagus appeared normal 2.   Acute gastritis (inflammation) was found 3.   The duodenal mucosa showed no abnormalities 4.    No upper G.I. source of bleeding found RECOMMENDATIONS: Would recommend going ahead with colonoscopy tomorrow   _______________________________ eSigned:  Laurence Spates, MD 10/03/2014 3:29 PM     cc: Jeanann Lewandowsky, MD  CPT CODES: ICD CODES:  The ICD and CPT codes recommended by this software are interpretations from the data that the clinical staff has captured with the software.  The verification of the translation of this report to the ICD and CPT codes and modifiers is the  sole responsibility of the health care institution and practicing physician where this report was generated.  [employer] will not be held responsible for the validity of the ICD and CPT codes included on this report.  AMA assumes no liability for data contained or not contained herein. CPT is a Designer, television/film set of the [employer].  PATIENT NAME:  Gregory, [name] MR#: [id number]

## 2014-10-03 NOTE — ED Notes (Signed)
1515 upper endo.

## 2014-10-03 NOTE — ED Notes (Signed)
Patient is resting comfortably. 

## 2014-10-03 NOTE — ED Notes (Signed)
Dr Le at bedside.  

## 2014-10-03 NOTE — ED Notes (Signed)
Pharmacy notified to send protonix.

## 2014-10-03 NOTE — Progress Notes (Signed)
Pt transferred from the ED. Pt awake, oriented not complaining of any pain. VVS at this time. Son at bedside both patient and son oriented to unit. Pt Password= "[email or web address]"

## 2014-10-03 NOTE — ED Notes (Signed)
Pt brought back to ED, placed on cardiac monitor.  Patient is pending transport to [facility] once report has been completed.

## 2014-10-03 NOTE — Progress Notes (Signed)
PROGRESS NOTE  Gregory [name] RCV:[id number] DOB: [birth year] DOA: 10/02/2014 PCP: [name], MD  HPI/Recap of past 24 hours: Returned from EGD, currently aaox3, wanting to eat. Denies chest pain, no sob, no ab pain, no acute bleed.  Assessment/Plan: Principal Problem:   GI bleed Active Problems:   Hypertension   Hyperlipidemia . GI bleed, no overt bleed in the hospital. vital stable. plavix on hold. EGD showed gastritis but no active bleed. On ppi bid. For colonoscopy tomorrow. GI input appreciated.  . Hypertension, hold bp med for now. Resume when appropriate.   . Hyperlipidemia, continue statin  H/o CVA with minimal right sided weakness. plavix on hold for now.  Code Status: full  Family Communication: patient  Disposition Plan: home when stable   Consultants:  GI  Procedures:  EGD 2/18  Colonoscopy 2/19  Antibiotics:  none   Objective: BP 131/109 mmHg  Pulse 73  Temp(Src) 98.8 F (37.1 C) (Oral)  Resp 24  Ht 5\' 11"  (1.803 m)  Wt 80.1 kg (176 lb 9.4 oz)  BMI 24.64 kg/m2  SpO2 100%  Intake/Output Summary (Last 24 hours) at 10/03/14 1829 Last data filed at 10/03/14 1800  Gross per 24 hour  Intake    440 ml  Output    425 ml  Net     15 ml   Filed Weights   10/03/14 1344 10/03/14 1642  Weight: 83.915 kg (185 lb) 80.1 kg (176 lb 9.4 oz)    Exam: GEN: Pleasant patient lying in the stretcher in no acute distress; cooperative with exam. PSYCH: alert and oriented x4; does not appear anxious or depressed; affect is appropriate. 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 Mucous membranes pink and anicteric; PERRLA; EOM intact; no cervical lymphadenopathy nor thyromegaly or carotid bruit; no JVD; There were no stridor. Neck is very supple. Breasts:: Not examined CHEST WALL: No tenderness CHEST: Normal respiration, clear to auscultation bilaterally.  HEART: Regular rate and rhythm. soft murmur precordial region,no rub, or gallops.  BACK: No  kyphosis or scoliosis; no CVA tenderness ABDOMEN: soft and non-tender; no masses, no organomegaly, normal abdominal bowel sounds; no pannus; no intertriginous candida. There is no rebound and no distention. Rectal Exam: Per Dr Carlis Abbott, maroon stool. EXTREMITIES: No bone or joint deformity; age-appropriate arthropathy of the hands and knees; no edema; no ulcerations. There is no calf tenderness. Chronic bony enlargement right ankle ( reported arthritis required steroid shot in the past) Genitalia: not examined PULSES: 2+ and symmetric SKIN: Normal hydration no rash or ulceration CNS: Cranial nerves 2-12 grossly intact no focal lateralizing neurologic deficit. Speech is fluent; uvula elevated with phonation, facial symmetry and tongue midline. DTR are normal bilaterally, cerebella exam is intact, barbinski is negative and strengths are equaled bilaterally. No sensory loss.  Data Reviewed: Basic Metabolic Panel:  Recent Labs Lab 10/02/14 1844  NA 138  K 4.2  CL 106  CO2 28  GLUCOSE 119*  BUN 14  CREATININE 0.96  CALCIUM 9.0   Liver Function Tests:  Recent Labs Lab 10/02/14 1844  AST 33  ALT 15  ALKPHOS 69  BILITOT 1.5*  PROT 6.3  ALBUMIN 3.9   No results for input(s): LIPASE, AMYLASE in the last 168 hours. No results for input(s): AMMONIA in the last 168 hours. CBC:  Recent Labs Lab 10/02/14 1844 10/03/14 0317 10/03/14 0855 10/03/14 1517  WBC 6.6 5.3 5.4 6.1  NEUTROABS 4.4  --   --   --   HGB 11.5* 10.4* 10.6*  10.7*  HCT 35.3* 31.4* 31.9* 32.7*  MCV 86.9 84.9 84.8 85.4  PLT 153 146* 141* 150   Cardiac Enzymes:   No results for input(s): CKTOTAL, CKMB, CKMBINDEX, TROPONINI in the last 168 hours. BNP (last 3 results) No results for input(s): BNP in the last 8760 hours.  ProBNP (last 3 results) No results for input(s): PROBNP in the last 8760 hours.  CBG: No results for input(s): GLUCAP in the last 168 hours.  No results found for this or any previous visit  (from the past 240 hour(s)).   Studies: No results found.  Scheduled Meds: . brimonidine  1 drop Both Eyes QHS  . [START ON 10/06/2014] pantoprazole (PROTONIX) IV  40 mg Intravenous Q12H  . polyethylene glycol-electrolytes  4,000 mL Oral Once  . rosuvastatin  5 mg Oral QHS  . sodium chloride  3 mL Intravenous Q12H    Continuous Infusions: . sodium chloride    . dextrose 5 % and 0.9% NaCl 75 mL/hr at 10/03/14 1800  . pantoprozole (PROTONIX) infusion 8 mg/hr (10/03/14 1800)       [name]  [facility] Pager [phone]. If 7PM-7AM, please contact night-coverage at [email or web address], password [facility] 10/03/2014, 6:29 PM  LOS: 0 days

## 2014-10-03 NOTE — Consult Note (Signed)
[facility] CONSULT Reason for consult: G.I. bleeding Referring Physician: [facility] PCP: Dr. Clark  Gregory [name] is an 81 y.o. male.  HPI: he has approximately 4 to 5 day history of melenic stools and had positive stools yesterday and Dr. Clark's office. He has no abdominal pain and is not seen any bright blood. He generally denies the use of NSAIDs but does take occasional Goody powders and is a bit vague on how much that he takes. He's had colonoscopy about 20 years ago. He can't remember what was found he reports that he does have a history of diverticulosis. His hemoglobin was 11.  Past Medical History  Diagnosis Date  . Hypertension   . Hypercholesteremia   . Murmur, cardiac   . Arthritis   . Stroke     History reviewed. No pertinent past surgical history.  History reviewed. No pertinent family history.  Social History:  reports that he has never smoked. He has never used smokeless tobacco. He reports that he does not drink alcohol or use illicit drugs.  Allergies:  Allergies  Allergen Reactions  . Sulfa Antibiotics   . Ramipril     Medications; Prior to Admission medications   Medication Sig Start Date End Date Taking? Authorizing Provider  amLODipine (NORVASC) 5 MG tablet Take 5 mg by mouth daily.   Yes Historical Provider, MD  brimonidine (ALPHAGAN) 0.2 % ophthalmic solution Place 1 drop into both eyes at bedtime.  03/22/14  Yes Historical Provider, MD  rosuvastatin (CRESTOR) 5 MG tablet Take 5 mg by mouth at bedtime.   Yes Historical Provider, MD  clopidogrel (PLAVIX) 75 MG tablet Take 75 mg by mouth daily.    Historical Provider, MD   . brimonidine  1 drop Both Eyes QHS  . [START ON 10/06/2014] pantoprazole (PROTONIX) IV  40 mg Intravenous Q12H  . rosuvastatin  5 mg Oral QHS  . sodium chloride  3 mL Intravenous Q12H   PRN Meds  Results for orders placed or performed during the hospital encounter of 10/02/14 (from the past 48 hour(s))  CBC with  Differential     Status: Abnormal   Collection Time: 10/02/14  6:44 PM  Result Value Ref Range   WBC 6.6 4.0 - 10.5 K/uL   RBC 4.06 (L) 4.22 - 5.81 MIL/uL   Hemoglobin 11.5 (L) 13.0 - 17.0 g/dL   HCT 35.3 (L) 39.0 - 52.0 %   MCV 86.9 78.0 - 100.0 fL   MCH 28.3 26.0 - 34.0 pg   MCHC 32.6 30.0 - 36.0 g/dL   RDW 14.9 11.5 - 15.5 %   Platelets 153 150 - 400 K/uL   Neutrophils Relative % 65 43 - 77 %   Neutro Abs 4.4 1.7 - 7.7 K/uL   Lymphocytes Relative 26 12 - 46 %   Lymphs Abs 1.8 0.7 - 4.0 K/uL   Monocytes Relative 6 3 - 12 %   Monocytes Absolute 0.4 0.1 - 1.0 K/uL   Eosinophils Relative 2 0 - 5 %   Eosinophils Absolute 0.1 0.0 - 0.7 K/uL   Basophils Relative 1 0 - 1 %   Basophils Absolute 0.0 0.0 - 0.1 K/uL  Comprehensive metabolic panel     Status: Abnormal   Collection Time: 10/02/14  6:44 PM  Result Value Ref Range   Sodium 138 135 - 145 mmol/L   Potassium 4.2 3.5 - 5.1 mmol/L   Chloride 106 96 - 112 mmol/L   CO2 28 19 - 32   mmol/L   Glucose, Bld 119 (H) 70 - 99 mg/dL   BUN 14 6 - 23 mg/dL   Creatinine, Ser 0.96 0.50 - 1.35 mg/dL   Calcium 9.0 8.4 - 10.5 mg/dL   Total Protein 6.3 6.0 - 8.3 g/dL   Albumin 3.9 3.5 - 5.2 g/dL   AST 33 0 - 37 U/L   ALT 15 0 - 53 U/L   Alkaline Phosphatase 69 39 - 117 U/L   Total Bilirubin 1.5 (H) 0.3 - 1.2 mg/dL   GFR calc non Af Amer 75 (L) >90 mL/min   GFR calc Af Amer 87 (L) >90 mL/min    Comment: (NOTE) The eGFR has been calculated using the CKD EPI equation. This calculation has not been validated in all clinical situations. eGFR's persistently <90 mL/min signify possible Chronic Kidney Disease.    Anion gap 4 (L) 5 - 15    Comment: REPEATED TO VERIFY  Protime-INR     Status: None   Collection Time: 10/02/14  6:46 PM  Result Value Ref Range   Prothrombin Time 14.7 11.6 - 15.2 seconds   INR 1.14 0.00 - 1.49  POC occult blood, ED     Status: Abnormal   Collection Time: 10/03/14 12:44 AM  Result Value Ref Range   Fecal Occult  Bld POSITIVE (A) NEGATIVE  CBC     Status: Abnormal   Collection Time: 10/03/14  3:17 AM  Result Value Ref Range   WBC 5.3 4.0 - 10.5 K/uL   RBC 3.70 (L) 4.22 - 5.81 MIL/uL   Hemoglobin 10.4 (L) 13.0 - 17.0 g/dL   HCT 31.4 (L) 39.0 - 52.0 %   MCV 84.9 78.0 - 100.0 fL   MCH 28.1 26.0 - 34.0 pg   MCHC 33.1 30.0 - 36.0 g/dL   RDW 14.8 11.5 - 15.5 %   Platelets 146 (L) 150 - 400 K/uL    No results found.             Blood pressure 121/69, pulse 81, temperature 98.1 F (36.7 C), temperature source Oral, resp. rate 25, SpO2 99 %.  Physical exam:   General--Pleasant African-American male no acute distress ENT-- mucous membranes moist no blood in the mouth Neck-- no lymphadenopathy Heart-- regular rate and rhythm without murmurs are gallops Lungs--clear Abdomen-- soft and nontender Psych-- alert and oriented times 3. Answers questions appropriately   Assessment: 1. G.I. bleed. This could well be a diverticular bleed but given the use of Goody powders, I think we need to rule out upper G.I. Source 2. History of stroke/TIA in the past. He's been on antiplatelet agents for this reason  Plan: 1. Agree with continuing him on current PPI therapy for now. We will let him on this afternoon for EGD. Have discussed this with the patient and he is agreeable.   [name],Corisa Montini L 10/03/2014, 6:54 AM

## 2014-10-03 NOTE — H&P (Signed)
[facility] History and Physical  Gregory [name] NIO:[id number] DOB: [birth year]    PCP:   [name], MD   Chief Complaint: melanotic and maroon stool.   HPI: Gregory [name] is an 81 y.o. male with hx of prior CVA, on Plavix, no cardiac stent, hx of diverticulosis with prior diverticular bleed, HTN, HLD, sent from Dr Jeanann Lewandowsky office for several days of melena and bloody stool.  He has no abdominal pain, SOB, CP, nausea or vomiting.  He doesn't drink, but had been using "Goody Powder".  He denied NSAIDS or steroids use.  GI was consulted by EDP, and hospitalist was asked to admit him.  His Hb was 11.5 grams per dL, and his BUN was 14.  He maintained normal hemodynamics.    Rewiew of Systems:  Constitutional: Negative for malaise, fever and chills. No significant weight loss or weight gain Eyes: Negative for eye pain, redness and discharge, diplopia, visual changes, or flashes of light. ENMT: Negative for ear pain, hoarseness, nasal congestion, sinus pressure and sore throat. No headaches; tinnitus, drooling, or problem swallowing. Cardiovascular: Negative for chest pain, palpitations, diaphoresis, dyspnea and peripheral edema. ; No orthopnea, PND Respiratory: Negative for cough, hemoptysis, wheezing and stridor. No pleuritic chestpain. Gastrointestinal: Negative for nausea, vomiting, diarrhea, constipation, abdominal pain, hematemesis, jaundice  Genitourinary: Negative for frequency, dysuria, incontinence,flank pain and hematuria; Musculoskeletal: Negative for back pain and neck pain. Negative for swelling and trauma.;  Skin: . Negative for pruritus, rash, abrasions, bruising and skin lesion.; ulcerations Neuro: Negative for headache, lightheadedness and neck stiffness. Negative for weakness, altered level of consciousness , altered mental status, extremity weakness, burning feet, involuntary movement, seizure and syncope.  Psych: negative for anxiety, depression, insomnia,  tearfulness, panic attacks, hallucinations, paranoia, suicidal or homicidal ideation    Past Medical History  Diagnosis Date  . Hypertension   . Hypercholesteremia   . Murmur, cardiac   . Arthritis   . Stroke     Medications:  HOME MEDS: Prior to Admission medications   Medication Sig Start Date End Date Taking? Authorizing Provider  amLODipine (NORVASC) 5 MG tablet Take 5 mg by mouth daily.   Yes Historical Provider, MD  brimonidine (ALPHAGAN) 0.2 % ophthalmic solution Place 1 drop into both eyes at bedtime.  03/22/14  Yes Historical Provider, MD  rosuvastatin (CRESTOR) 5 MG tablet Take 5 mg by mouth at bedtime.   Yes Historical Provider, MD  clopidogrel (PLAVIX) 75 MG tablet Take 75 mg by mouth daily.    Historical Provider, MD     Allergies:  Allergies  Allergen Reactions  . Sulfa Antibiotics   . Ramipril     Physical Exam: Filed Vitals:   10/02/14 1837 10/02/14 2045 10/02/14 2246  BP: 127/74 140/68 122/68  Pulse: 99 87 78  Temp: 98.3 F (36.8 C) 98.1 F (36.7 C)   TempSrc: Oral Oral   Resp: 16 18 17   SpO2: 98% 100% 98%   Blood pressure 122/68, pulse 78, temperature 98.1 F (36.7 C), temperature source Oral, resp. rate 17, SpO2 98 %.  GEN:  Pleasant  patient lying in the stretcher in no acute distress; cooperative with exam. PSYCH:  alert and oriented x4; does not appear anxious or depressed; affect is appropriate. HEENT: Mucous membranes pink and anicteric; PERRLA; EOM intact; no cervical lymphadenopathy nor thyromegaly or carotid bruit; no JVD; There were no stridor. Neck is very supple. Breasts:: Not examined CHEST WALL: No tenderness CHEST: Normal respiration, clear to auscultation bilaterally.  HEART: Regular rate and rhythm.  There are no murmur, rub, or gallops.   BACK: No kyphosis or scoliosis; no CVA tenderness ABDOMEN: soft and non-tender; no masses, no organomegaly, normal abdominal bowel sounds; no pannus; no intertriginous candida. There is no  rebound and no distention. Rectal Exam: Per Dr Carlis Abbott, maroon stool. EXTREMITIES: No bone or joint deformity; age-appropriate arthropathy of the hands and knees; no edema; no ulcerations.  There is no calf tenderness. Genitalia: not examined PULSES: 2+ and symmetric SKIN: Normal hydration no rash or ulceration CNS: Cranial nerves 2-12 grossly intact no focal lateralizing neurologic deficit.  Speech is fluent; uvula elevated with phonation, facial symmetry and tongue midline. DTR are normal bilaterally, cerebella exam is intact, barbinski is negative and strengths are equaled bilaterally.  No sensory loss.   Labs on Admission:  Basic Metabolic Panel:  Recent Labs Lab 10/02/14 1844  NA 138  K 4.2  CL 106  CO2 28  GLUCOSE 119*  BUN 14  CREATININE 0.96  CALCIUM 9.0   Liver Function Tests:  Recent Labs Lab 10/02/14 1844  AST 33  ALT 15  ALKPHOS 69  BILITOT 1.5*  PROT 6.3  ALBUMIN 3.9    Recent Labs Lab 10/02/14 1844  WBC 6.6  NEUTROABS 4.4  HGB 11.5*  HCT 35.3*  MCV 86.9  PLT 153    Assessment/Plan Present on Admission:  . GI bleed . Hypertension . Hyperlipidemia  PLAN:  I am concerned that he may have an UGI bleed, so will put him in SDU, start IV PPI drip with bolus.  GI has been consulted, and will likely perform an EGD.  He doesn't drink and has no stigmata of chronic liver disease.  WIll stop his plavix.  WIll hold his Norvasc as well.  He is stable, full code, and will be admitted to [facility] service. Thank you for allowing me to participate in his care.   Other plans as per orders.  Code Status: FULL Haskel Khan, MD. [facility] Pager [phone] 7pm to 7am.  10/03/2014, 12:35 AM

## 2014-10-03 NOTE — Interval H&P Note (Signed)
History and Physical Interval Note:  10/03/2014 2:54 PM  Gregory [name]  has presented today for surgery, with the diagnosis of GI bleed  The various methods of treatment have been discussed with the patient and family. After consideration of risks, benefits and other options for treatment, the patient has consented to  Procedure(s): ESOPHAGOGASTRODUODENOSCOPY (EGD) (N/A) as a surgical intervention .  The patient's history has been reviewed, patient examined, no change in status, stable for surgery.  I have reviewed the patient's chart and labs.  Questions were answered to the patient's satisfaction.     [name],Neena Beecham L

## 2014-10-03 NOTE — H&P (View-Only) (Signed)
[facility] CON[facility] Reason for consult: G.I. bleeding Referring Physician: [facility] PCP: Dr. Jacquelynn Cree is an 81 y.o. male.  HPI: he has approximately 4 to 5 day history of melenic stools and had positive stools yesterday and Dr. Ainsley Spinner office. He has no abdominal pain and is not seen any bright blood. He generally denies the use of NSAIDs but does take occasional Goody powders and is a bit vague on how much that he takes. He's had colonoscopy about 20 years ago. He can't remember what was found he reports that he does have a history of diverticulosis. His hemoglobin was 11.  Past Medical History  Diagnosis Date  . Hypertension   . Hypercholesteremia   . Murmur, cardiac   . Arthritis   . Stroke     History reviewed. No pertinent past surgical history.  History reviewed. No pertinent family history.  Social History:  reports that he has never smoked. He has never used smokeless tobacco. He reports that he does not drink alcohol or use illicit drugs.  Allergies:  Allergies  Allergen Reactions  . Sulfa Antibiotics   . Ramipril     Medications; Prior to Admission medications   Medication Sig Start Date End Date Taking? Authorizing Provider  amLODipine (NORVASC) 5 MG tablet Take 5 mg by mouth daily.   Yes Historical Provider, MD  brimonidine (ALPHAGAN) 0.2 % ophthalmic solution Place 1 drop into both eyes at bedtime.  03/22/14  Yes Historical Provider, MD  rosuvastatin (CRESTOR) 5 MG tablet Take 5 mg by mouth at bedtime.   Yes Historical Provider, MD  clopidogrel (PLAVIX) 75 MG tablet Take 75 mg by mouth daily.    Historical Provider, MD   . brimonidine  1 drop Both Eyes QHS  . [START ON 10/06/2014] pantoprazole (PROTONIX) IV  40 mg Intravenous Q12H  . rosuvastatin  5 mg Oral QHS  . sodium chloride  3 mL Intravenous Q12H   PRN Meds  Results for orders placed or performed during the hospital encounter of 10/02/14 (from the past 48 hour(s))  CBC with  Differential     Status: Abnormal   Collection Time: 10/02/14  6:44 PM  Result Value Ref Range   WBC 6.6 4.0 - 10.5 K/uL   RBC 4.06 (L) 4.22 - 5.81 MIL/uL   Hemoglobin 11.5 (L) 13.0 - 17.0 g/dL   HCT 35.3 (L) 39.0 - 52.0 %   MCV 86.9 78.0 - 100.0 fL   MCH 28.3 26.0 - 34.0 pg   MCHC 32.6 30.0 - 36.0 g/dL   RDW 14.9 11.5 - 15.5 %   Platelets 153 150 - 400 K/uL   Neutrophils Relative % 65 43 - 77 %   Neutro Abs 4.4 1.7 - 7.7 K/uL   Lymphocytes Relative 26 12 - 46 %   Lymphs Abs 1.8 0.7 - 4.0 K/uL   Monocytes Relative 6 3 - 12 %   Monocytes Absolute 0.4 0.1 - 1.0 K/uL   Eosinophils Relative 2 0 - 5 %   Eosinophils Absolute 0.1 0.0 - 0.7 K/uL   Basophils Relative 1 0 - 1 %   Basophils Absolute 0.0 0.0 - 0.1 K/uL  Comprehensive metabolic panel     Status: Abnormal   Collection Time: 10/02/14  6:44 PM  Result Value Ref Range   Sodium 138 135 - 145 mmol/L   Potassium 4.2 3.5 - 5.1 mmol/L   Chloride 106 96 - 112 mmol/L   CO2 28 19 - 32  mmol/L   Glucose, Bld 119 (H) 70 - 99 mg/dL   BUN 14 6 - 23 mg/dL   Creatinine, Ser 0.96 0.50 - 1.35 mg/dL   Calcium 9.0 8.4 - 10.5 mg/dL   Total Protein 6.3 6.0 - 8.3 g/dL   Albumin 3.9 3.5 - 5.2 g/dL   AST 33 0 - 37 U/L   ALT 15 0 - 53 U/L   Alkaline Phosphatase 69 39 - 117 U/L   Total Bilirubin 1.5 (H) 0.3 - 1.2 mg/dL   GFR calc non Af Amer 75 (L) >90 mL/min   GFR calc Af Amer 87 (L) >90 mL/min    Comment: (NOTE) The eGFR has been calculated using the CKD EPI equation. This calculation has not been validated in all clinical situations. eGFR's persistently <90 mL/min signify possible Chronic Kidney Disease.    Anion gap 4 (L) 5 - 15    Comment: REPEATED TO VERIFY  Protime-INR     Status: None   Collection Time: 10/02/14  6:46 PM  Result Value Ref Range   Prothrombin Time 14.7 11.6 - 15.2 seconds   INR 1.14 0.00 - 1.49  POC occult blood, ED     Status: Abnormal   Collection Time: 10/03/14 12:44 AM  Result Value Ref Range   Fecal Occult  Bld POSITIVE (A) NEGATIVE  CBC     Status: Abnormal   Collection Time: 10/03/14  3:17 AM  Result Value Ref Range   WBC 5.3 4.0 - 10.5 K/uL   RBC 3.70 (L) 4.22 - 5.81 MIL/uL   Hemoglobin 10.4 (L) 13.0 - 17.0 g/dL   HCT 31.4 (L) 39.0 - 52.0 %   MCV 84.9 78.0 - 100.0 fL   MCH 28.1 26.0 - 34.0 pg   MCHC 33.1 30.0 - 36.0 g/dL   RDW 14.8 11.5 - 15.5 %   Platelets 146 (L) 150 - 400 K/uL    No results found.             Blood pressure 121/69, pulse 81, temperature 98.1 F (36.7 C), temperature source Oral, resp. rate 25, SpO2 99 %.  Physical exam:   General--Pleasant African-American male no acute distress ENT-- mucous membranes moist no blood in the mouth Neck-- no lymphadenopathy Heart-- regular rate and rhythm without murmurs are gallops Lungs--clear Abdomen-- soft and nontender Psych-- alert and oriented times 3. Answers questions appropriately   Assessment: 1. G.I. bleed. This could well be a diverticular bleed but given the use of Goody powders, I think we need to rule out upper G.I. Source 2. History of stroke/TIA in the past. He's been on antiplatelet agents for this reason  Plan: 1. Agree with continuing him on current PPI therapy for now. We will let him on this afternoon for EGD. Have discussed this with the patient and he is agreeable.   Harshitha Fretz JR,Sindy Mccune L 10/03/2014, 6:54 AM

## 2014-10-04 ENCOUNTER — Encounter (HOSPITAL_COMMUNITY)
Admission: EM | Disposition: A | Discharge status: Home / Self Care | Payer: Self-pay | Source: Home / Self Care | Attending: Gastroenterology

## 2014-10-04 ENCOUNTER — Inpatient Hospital Stay (HOSPITAL_COMMUNITY): Payer: Medicare Other | Admitting: Anesthesiology

## 2014-10-04 ENCOUNTER — Encounter (HOSPITAL_COMMUNITY): Payer: Self-pay | Admitting: Gastroenterology

## 2014-10-04 HISTORY — PX: COLONOSCOPY WITH PROPOFOL: SHX5780

## 2014-10-04 SURGERY — COLONOSCOPY
Anesthesia: Monitor Anesthesia Care

## 2014-10-04 SURGERY — COLONOSCOPY WITH PROPOFOL
Anesthesia: Monitor Anesthesia Care

## 2014-10-04 MED ORDER — FENTANYL CITRATE 0.05 MG/ML IJ SOLN
INTRAMUSCULAR | Status: DC | PRN
  Administered 2014-10-04: 50 ug via INTRAVENOUS
  Administered 2014-10-04: 100 ug via INTRAVENOUS
  Administered 2014-10-04 (×2): 50 ug via INTRAVENOUS

## 2014-10-04 MED ORDER — SENNA-DOCUSATE SODIUM 8.6-50 MG PO TABS: 1.0000 | ORAL_TABLET | Freq: Two times a day (BID) | ORAL | Status: DC

## 2014-10-04 MED ORDER — FAMOTIDINE 20 MG PO TABS: 20.0000 mg | ORAL_TABLET | Freq: Every day | ORAL | Status: DC

## 2014-10-04 MED ORDER — LIDOCAINE HCL (CARDIAC) 20 MG/ML IV SOLN
INTRAVENOUS | Status: DC | PRN
  Administered 2014-10-04: 40 mg via INTRAVENOUS

## 2014-10-04 MED ORDER — POLYETHYLENE GLYCOL 3350 17 G PO PACK
17.0000 g | PACK | Freq: Two times a day (BID) | ORAL | Status: DC
  Filled 2014-10-04 (×2): qty 1

## 2014-10-04 MED ORDER — PROPOFOL 10 MG/ML IV BOLUS
INTRAVENOUS | Status: DC | PRN
  Administered 2014-10-04 (×2): 30 mg via INTRAVENOUS
  Administered 2014-10-04: 40 mg via INTRAVENOUS
  Administered 2014-10-04: 20 mg via INTRAVENOUS
  Administered 2014-10-04: 30 mg via INTRAVENOUS

## 2014-10-04 MED ORDER — POLYETHYLENE GLYCOL 3350 17 G PO PACK: 17.0000 g | PACK | Freq: Two times a day (BID) | ORAL | Status: DC

## 2014-10-04 MED ORDER — LACTATED RINGERS IV SOLN
INTRAVENOUS | Status: DC
  Administered 2014-10-04: 10:00:00 via INTRAVENOUS

## 2014-10-04 NOTE — Anesthesia Preprocedure Evaluation (Signed)
Anesthesia Evaluation  Patient identified by MRN, date of birth, ID band Patient awake    Reviewed: Allergy & Precautions  Airway Mallampati: I  TM Distance: >3 FB Neck ROM: Full    Dental   Pulmonary          Cardiovascular hypertension, Pt. on medications + Valvular Problems/Murmurs     Neuro/Psych    GI/Hepatic   Endo/Other    Renal/GU      Musculoskeletal   Abdominal   Peds  Hematology   Anesthesia Other Findings   Reproductive/Obstetrics                             Anesthesia Physical Anesthesia Plan  ASA: III  Anesthesia Plan: MAC   Post-op Pain Management:    Induction: Intravenous  Airway Management Planned: Natural Airway  Additional Equipment:   Intra-op Plan:   Post-operative Plan:   Informed Consent: I have reviewed the patients History and Physical, chart, labs and discussed the procedure including the risks, benefits and alternatives for the proposed anesthesia with the patient or authorized representative who has indicated his/her understanding and acceptance.     Plan Discussed with: CRNA and Surgeon  Anesthesia Plan Comments:         Anesthesia Quick Evaluation

## 2014-10-04 NOTE — Interval H&P Note (Signed)
History and Physical Interval Note:  10/04/2014 10:32 AM  Gregory [name]  has presented today for surgery, with the diagnosis of GI bleed  The various methods of treatment have been discussed with the patient and family. After consideration of risks, benefits and other options for treatment, the patient has consented to  Procedure(s): COLONOSCOPY WITH PROPOFOL (N/A) as a surgical intervention .  The patient's history has been reviewed, patient examined, no change in status, stable for surgery.  I have reviewed the patient's chart and labs.  Questions were answered to the patient's satisfaction.     Marshe Shrestha JR,Mar Walmer L

## 2014-10-04 NOTE — Op Note (Signed)
[facility] [place], [id number]   COLONOSCOPY PROCEDURE REPORT  PATIENT: Gregory [name], Gregory [name]  MR#: [id number] BIRTHDATE: 1930-01-10 , 84  yrs. old GENDER: male ENDOSCOPIST: Laurence Spates, MD REFERRED BY:  Jeanann Lewandowsky, M.D. PROCEDURE DATE:  2014/10/27 PROCEDURE:   Colonoscopy with biopsy ASA CLASS:   Class III INDICATIONS:G.I.  bleeding with negative EGD yesterday. MEDICATIONS: Monitored anesthesia care  DESCRIPTION OF PROCEDURE:   After the risks and benefits and of the procedure were explained, informed consent was obtained.  revealed no abnormalities of the rectum.    The Pentax Adult Colon [id number] endoscope was introduced through the anus and advanced to the cecum, which was identified by both the appendix and ileocecal valve .  The quality of the prep was good. .  The instrument was then slowly withdrawn as the colon was fully examined.     COLON FINDINGS: Due to extensive diverticular disease we change to the Pentax ultrathin colonoscope and were able to pass that scope without difficulty.   There was severe diverticulosis noted in the sigmoid colon.   A smooth sessile polyp measuring 2 mm in size was found at the cecum.  A biopsy was performed.  Polyp located right at the appendiceal orifice.     Retroflexed views revealed no abnormalities.     The scope was then withdrawn from the patient and the procedure completed.  WITHDRAWAL TIME:  COMPLICATIONS: There were no immediate complications. ENDOSCOPIC IMPRESSION: 1.   Due to extensive diverticular disease we change to the Pentax ultrathin colonoscope and were able to pass that scope without difficulty 2.   Severe diverticulosis was noted in the sigmoid colon 3.   Sessile polyp was found at the cecum; biopsy was performed 4.    G.I. bleeding may well have been due to diverticular disease RECOMMENDATIONS: Should be okay to discharge.  Would suggest discharging home on Miralax.   We'll check pass on polyp and decide if he needs follow-up colonoscopy  REPEAT EXAM:  [id number] Carlis Abbott, MD  _______________________________ eSigned:  Laurence Spates, MD 10-27-2014 11:27 AM   CPT CODES: ICD CODES:  The ICD and CPT codes recommended by this software are interpretations from the data that the clinical staff has captured with the software.  The verification of the translation of this report to the ICD and CPT codes and modifiers is the sole responsibility of the health care institution and practicing physician where this report was generated.  [employer] will not be held responsible for the validity of the ICD and CPT codes included on this report.  AMA assumes no liability for data contained or not contained herein. CPT is a Designer, television/film set of the [employer].   PATIENT NAME:  Gregory [name], Gregory [name] MR#: [id number]

## 2014-10-04 NOTE — Interval H&P Note (Signed)
History and Physical Interval Note:  10/04/2014 10:32 AM  Gregory [name]  has presented today for surgery, with the diagnosis of GI bleed  The various methods of treatment have been discussed with the patient and family. After consideration of risks, benefits and other options for treatment, the patient has consented to  Procedure(s): COLONOSCOPY WITH PROPOFOL (N/A) as a surgical intervention .  The patient's history has been reviewed, patient examined, no change in status, stable for surgery.  I have reviewed the patient's chart and labs.  Questions were answered to the patient's satisfaction.     Raylyn Carton JR,Evi Mccomb L

## 2014-10-04 NOTE — Progress Notes (Signed)
Back from Endo. Resting in bed only complaint is feeling hungry.  VSS.  Wants to go home, awaiting MD.

## 2014-10-04 NOTE — Progress Notes (Signed)
Discharge instructions given to patient with teach-back.  All questions answered.  Discharged home no complaints.

## 2014-10-04 NOTE — Anesthesia Postprocedure Evaluation (Signed)
Anesthesia Post Note  Patient: Gregory [name]  Procedure(s) Performed: Procedure(s) (LRB): COLONOSCOPY WITH PROPOFOL (N/A)  Anesthesia type: general  Patient location: PACU  Post pain: Pain level controlled  Post assessment: Patient's Cardiovascular Status Stable  Last Vitals:  Filed Vitals:   10/04/14 1134  BP: 115/64  Pulse: 72  Temp: 36.6 C  Resp: 16    Post vital signs: Reviewed and stable  Level of consciousness: sedated  Complications: No apparent anesthesia complications

## 2014-10-04 NOTE — Transfer of Care (Signed)
Immediate Anesthesia Transfer of Care Note  Patient: Gregory [name]  Procedure(s) Performed: Procedure(s): COLONOSCOPY WITH PROPOFOL (N/A)  Patient Location: Endoscopy Unit  Anesthesia Type:MAC  Level of Consciousness: awake and alert   Airway & Oxygen Therapy: Patient Spontanous Breathing  Post-op Assessment: Report given to RN and Post -op Vital signs reviewed and stable  Post vital signs: Reviewed and stable  Last Vitals:  Filed Vitals:   10/04/14 1011  BP: 134/71  Pulse: 80  Temp: 36.8 C  Resp: 18    Complications: No apparent anesthesia complications

## 2014-10-04 NOTE — Progress Notes (Signed)
Utilization review completed.  

## 2014-10-04 NOTE — Progress Notes (Signed)
Medicare Important Message given? YES  (If response is "NO", the following Medicare IM given date fields will be blank)  Date Medicare IM given: 10/04/14 Medicare IM given by:  Vyla Pint  

## 2014-10-04 NOTE — Discharge Summary (Addendum)
Discharge Summary  Gregory [name] MVE:[id number] DOB: 1930-04-23  PCP: [name], MD  Admit date: 10/02/2014 Discharge date: 10/04/2014  Time spent: less than 27mins  Recommendations for Outpatient Follow-up:  1. F/u with pmd 2. F/u with gi   Discharge Diagnoses:  Active Hospital Problems   Diagnosis Date Noted  . GI bleed 10/03/2014  . H/O: CVA (cerebrovascular accident)   . Hypertension 12/05/2010  . Hyperlipidemia 12/05/2010    Resolved Hospital Problems   Diagnosis Date Noted Date Resolved  No resolved problems to display.    Discharge Condition: stable  Diet recommendation: heart healthy  Filed Weights   10/03/14 1344 10/03/14 1642  Weight: 83.915 kg (185 lb) 80.1 kg (176 lb 9.4 oz)    History of present illness:  Gregory [name] is an 81 y.o. male with hx of prior CVA, on Plavix, no cardiac stent, hx of diverticulosis with prior diverticular bleed, HTN, HLD, sent from Dr Jeanann Lewandowsky office for several days of melena and bloody stool. He has no abdominal pain, SOB, CP, nausea or vomiting. He doesn't drink, but had been using "Goody Powder". He denied NSAIDS or steroids use. GI was consulted by EDP, and hospitalist was asked to admit him. His Hb was 11.5 grams per dL, and his BUN was 14. He maintained normal hemodynamics.   Hospital Course:  Principal Problem:   GI bleed Active Problems:   Hypertension   Hyperlipidemia   H/O: CVA (cerebrovascular accident)  GI bleed/melana, no overt bleed in the hospital. vital stable. plavix on hold. EGD showed gastritis but no active bleed. Colonoscopy with diverticulosis, small polyps s/p biopsy. Received ppi bid/vif in the hospital. Discharged with pepcid 20mg  po qd, instructed patient to avoid goody powder/avoid NSAIDs. plavix resumed at time of discharge per GI.  Chronic constipation: miralax daily provided at time of discharge  . Hypertension, home bp med on hold in hospital, Resumed at time of discharge.    . Hyperlipidemia, continue statin  H/o CVA with minimal residual right sided weakness. plavix resumed at time of discharge.  Consultants:  GI  Procedures:  EGD 2/18  Colonoscopy 2/19  Antibiotics:  none  Discharge Exam: BP 115/64 mmHg  Pulse 72  Temp(Src) 97.9 F (36.6 C) (Oral)  Resp 16  Ht 5\' 11"  (1.803 m)  Wt 80.1 kg (176 lb 9.4 oz)  BMI 24.64 kg/m2  SpO2 95%  GEN: Pleasant patient lying in the stretcher in no acute distress; cooperative with exam. PSYCH: alert and oriented x4; does not appear anxious or depressed; affect is appropriate. 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 Mucous membranes pink and anicteric; PERRLA; EOM intact; no cervical lymphadenopathy nor thyromegaly or carotid bruit; no JVD; There were no stridor. Neck is very supple. Breasts:: Not examined CHEST WALL: No tenderness CHEST: Normal respiration, clear to auscultation bilaterally.  HEART: Regular rate and rhythm. soft murmur precordial region,no rub, or gallops.  BACK: No kyphosis or scoliosis; no CVA tenderness ABDOMEN: soft and non-tender; no masses, no organomegaly, normal abdominal bowel sounds; no pannus; no intertriginous candida. There is no rebound and no distention. Rectal Exam: Per Dr Carlis Abbott, maroon stool. EXTREMITIES: No bone or joint deformity; age-appropriate arthropathy of the hands and knees; no edema; no ulcerations. There is no calf tenderness. Chronic bony enlargement right ankle ( reported arthritis required steroid shot in the past) Genitalia: not examined PULSES: 2+ and symmetric SKIN: Normal hydration no rash or ulceration CNS: Cranial nerves 2-12 grossly intact no focal lateralizing neurologic deficit. Speech  is fluent; uvula elevated with phonation, facial symmetry and tongue midline. DTR are normal bilaterally, cerebella exam is intact, barbinski is negative and strengths are equaled bilaterally. No sensory loss.    Discharge Instructions You were cared for by  a hospitalist during your hospital stay. If you have any questions about your discharge medications or the care you received while you were in the hospital after you are discharged, you can call the unit and asked to speak with the hospitalist on call if the hospitalist that took care of you is not available. Once you are discharged, your primary care physician will handle any further medical issues. Please note that NO REFILLS for any discharge medications will be authorized once you are discharged, as it is imperative that you return to your primary care physician (or establish a relationship with a primary care physician if you do not have one) for your aftercare needs so that they can reassess your need for medications and monitor your lab values.      Discharge Instructions    Diet - low sodium heart healthy    Complete by:  As directed      Increase activity slowly    Complete by:  As directed             Medication List    TAKE these medications        amLODipine 5 MG tablet  Commonly known as:  NORVASC  Take 5 mg by mouth daily.     brimonidine 0.2 % ophthalmic solution  Commonly known as:  ALPHAGAN  Place 1 drop into both eyes at bedtime.     clopidogrel 75 MG tablet  Commonly known as:  PLAVIX  Take 75 mg by mouth daily.     famotidine 20 MG tablet  Commonly known as:  PEPCID  Take 1 tablet (20 mg total) by mouth daily.     polyethylene glycol packet  Commonly known as:  MIRALAX / GLYCOLAX  Take 17 g by mouth 2 (two) times daily.     rosuvastatin 5 MG tablet  Commonly known as:  CRESTOR  Take 5 mg by mouth at bedtime.     sennosides-docusate sodium 8.6-50 MG tablet  Commonly known as:  SENOKOT-S  Take 1 tablet by mouth 2 (two) times daily.       Allergies  Allergen Reactions  . Sulfa Antibiotics   . Ramipril    Follow-up Information    Follow up with [name], MD In 2 weeks.   Specialty:  Internal Medicine   Contact information:   [address] [place] [address] [phone]       Follow up with EDWARDS JR,JAMES L, MD In 4 weeks.   Specialty:  Gastroenterology   Contact information:   [address] [place] [address] [phone]        The results of significant diagnostics from this hospitalization (including imaging, microbiology, ancillary and laboratory) are listed below for reference.    Significant Diagnostic Studies: No results found.  Microbiology: Recent Results (from the past 240 hour(s))  MRSA PCR Screening     Status: None   Collection Time: 10/03/14  4:50 PM  Result Value Ref Range Status   MRSA by PCR NEGATIVE NEGATIVE Final    Comment:  The GeneXpert MRSA Assay (FDA approved for NASAL specimens only), is one component of a comprehensive MRSA colonization surveillance program. It is not intended to diagnose MRSA infection nor to guide or monitor treatment for MRSA infections.      Labs: Basic Metabolic Panel:  Recent Labs Lab 10/02/14 1844  NA 138  K 4.2  CL 106  CO2 28  GLUCOSE 119*  BUN 14  CREATININE 0.96  CALCIUM 9.0   Liver Function Tests:  Recent Labs Lab 10/02/14 1844  AST 33  ALT 15  ALKPHOS 69  BILITOT 1.5*  PROT 6.3  ALBUMIN 3.9   No results for input(s): LIPASE, AMYLASE in the last 168 hours. No results for input(s): AMMONIA in the last 168 hours. CBC:  Recent Labs Lab 10/02/14 1844 10/03/14 0317 10/03/14 0855 10/03/14 1517 10/03/14 2239  WBC 6.6 5.3 5.4 6.1 5.7  NEUTROABS 4.4  --   --   --   --   HGB 11.5* 10.4* 10.6* 10.7* 11.0*  HCT 35.3* 31.4* 31.9* 32.7* 33.5*  MCV 86.9 84.9 84.8 85.4 85.7  PLT 153 146* 141* 150 146*   Cardiac Enzymes: No results for input(s): CKTOTAL, CKMB, CKMBINDEX, TROPONINI in the last 168 hours. BNP: BNP (last 3 results) No results for input(s): BNP in the last 8760 hours.  ProBNP (last 3 results) No results for input(s): PROBNP in the last  8760 hours.  CBG: No results for input(s): GLUCAP in the last 168 hours.     Signed:  Renny Remer  [facility] 10/04/2014, 1:46 PM

## 2014-10-07 ENCOUNTER — Encounter (HOSPITAL_COMMUNITY): Payer: Self-pay | Admitting: Gastroenterology

## 2014-12-17 ENCOUNTER — Encounter (HOSPITAL_COMMUNITY): Payer: Self-pay | Admitting: Gastroenterology

## 2015-02-15 ENCOUNTER — Emergency Department (HOSPITAL_COMMUNITY): Payer: Medicare Other

## 2015-02-15 ENCOUNTER — Encounter (HOSPITAL_COMMUNITY): Payer: Self-pay | Admitting: Emergency Medicine

## 2015-02-15 ENCOUNTER — Emergency Department (HOSPITAL_COMMUNITY)
Admission: EM | Admit: 2015-02-15 | Discharge: 2015-02-15 | Disposition: A | Discharge status: Home / Self Care | Payer: Medicare Other | Attending: Emergency Medicine | Admitting: Emergency Medicine

## 2015-02-15 DIAGNOSIS — Z79899 Other long term (current) drug therapy: Secondary | ICD-10-CM | POA: Diagnosis not present

## 2015-02-15 DIAGNOSIS — S40012A Contusion of left shoulder, initial encounter: Secondary | ICD-10-CM | POA: Diagnosis not present

## 2015-02-15 DIAGNOSIS — Y998 Other external cause status: Secondary | ICD-10-CM | POA: Diagnosis not present

## 2015-02-15 DIAGNOSIS — M25561 Pain in right knee: Secondary | ICD-10-CM

## 2015-02-15 DIAGNOSIS — S80211A Abrasion, right knee, initial encounter: Secondary | ICD-10-CM | POA: Diagnosis not present

## 2015-02-15 DIAGNOSIS — S63502A Unspecified sprain of left wrist, initial encounter: Secondary | ICD-10-CM | POA: Diagnosis not present

## 2015-02-15 DIAGNOSIS — W1839XA Other fall on same level, initial encounter: Secondary | ICD-10-CM | POA: Insufficient documentation

## 2015-02-15 DIAGNOSIS — I10 Essential (primary) hypertension: Secondary | ICD-10-CM | POA: Insufficient documentation

## 2015-02-15 DIAGNOSIS — Y92009 Unspecified place in unspecified non-institutional (private) residence as the place of occurrence of the external cause: Secondary | ICD-10-CM | POA: Diagnosis not present

## 2015-02-15 DIAGNOSIS — Y9301 Activity, walking, marching and hiking: Secondary | ICD-10-CM | POA: Diagnosis not present

## 2015-02-15 DIAGNOSIS — Z23 Encounter for immunization: Secondary | ICD-10-CM | POA: Diagnosis not present

## 2015-02-15 DIAGNOSIS — E780 Pure hypercholesterolemia: Secondary | ICD-10-CM | POA: Diagnosis not present

## 2015-02-15 DIAGNOSIS — Z8673 Personal history of transient ischemic attack (TIA), and cerebral infarction without residual deficits: Secondary | ICD-10-CM | POA: Insufficient documentation

## 2015-02-15 DIAGNOSIS — M1990 Unspecified osteoarthritis, unspecified site: Secondary | ICD-10-CM | POA: Insufficient documentation

## 2015-02-15 DIAGNOSIS — R011 Cardiac murmur, unspecified: Secondary | ICD-10-CM | POA: Diagnosis not present

## 2015-02-15 DIAGNOSIS — S4992XA Unspecified injury of left shoulder and upper arm, initial encounter: Secondary | ICD-10-CM | POA: Diagnosis present

## 2015-02-15 DIAGNOSIS — Z7982 Long term (current) use of aspirin: Secondary | ICD-10-CM | POA: Diagnosis not present

## 2015-02-15 MED ORDER — TETANUS-DIPHTH-ACELL PERTUSSIS 5-2.5-18.5 LF-MCG/0.5 IM SUSP
0.5000 mL | Freq: Once | INTRAMUSCULAR | Status: AC
  Administered 2015-02-15: 0.5 mL via INTRAMUSCULAR
  Filled 2015-02-15: qty 0.5

## 2015-02-15 NOTE — ED Notes (Signed)
Patient presents with c/o fall yesterday. Reports left shoulder pain/left wrist pain as a result of the fall. Denies LOC.

## 2015-02-15 NOTE — ED Notes (Signed)
PA in room with patient at this time. 

## 2015-02-15 NOTE — ED Provider Notes (Signed)
CSN: [id number]     Arrival date & time 02/15/15  0645 History   First MD Initiated Contact with Patient 02/15/15 [id number]     Chief Complaint  Patient presents with  . Fall  . Shoulder Pain     (Consider location/radiation/quality/duration/timing/severity/associated sxs/prior Treatment) HPI Gregory [name] is a 81 y.o. male with history of hypertension, arthritis, CVA, presents to emergency department complaining of a fall. Patient states he was walking out of his house to get mail and missed a step. He states he fell down to the ground. Denies hitting his head or loss of consciousness. He states he fell onto his left arm and has left shoulder left wrist pain. He states he also scraped her right knee. Incident occurred yesterday. He took Advil which mildly relieved his pain. He denies any other complaints. He denies any pain in his back or his neck. No numbness or weakness in extremities. No difficulty walking. He is unsure of his tetanus. He does not recall getting it within the last 5-10 years.  Past Medical History  Diagnosis Date  . Hypertension   . Hypercholesteremia   . Murmur, cardiac   . Arthritis   . Stroke    Past Surgical History  Procedure Laterality Date  . Esophagogastroduodenoscopy N/A 10/03/2014    Procedure: ESOPHAGOGASTRODUODENOSCOPY (EGD);  Surgeon: Winfield Cunas., MD;  Location: [facility]COPY;  Service: Endoscopy;  Laterality: N/A;  . Colonoscopy with propofol N/A 10/04/2014    Procedure: COLONOSCOPY WITH PROPOFOL;  Surgeon: Laurence Spates, MD;  Location: [place];  Service: Endoscopy;  Laterality: N/A;   No family history on file. History  Substance Use Topics  . Smoking status: Never Smoker   . Smokeless tobacco: Never Used  . Alcohol Use: No    Review of Systems  Constitutional: Negative for fever and chills.  Respiratory: Negative for cough, chest tightness and shortness of breath.   Cardiovascular: Negative for chest pain, palpitations and leg  swelling.  Gastrointestinal: Negative for nausea, vomiting, abdominal pain, diarrhea and abdominal distention.  Musculoskeletal: Positive for arthralgias. Negative for back pain, gait problem, neck pain and neck stiffness.  Skin: Negative for rash.  Allergic/Immunologic: Negative for immunocompromised state.  Neurological: Negative for dizziness, weakness, light-headedness, numbness and headaches.  All other systems reviewed and are negative.     Allergies  Sulfa antibiotics and Ramipril  Home Medications   Prior to Admission medications   Medication Sig Start Date End Date Taking? Authorizing Provider  amLODipine (NORVASC) 5 MG tablet Take 5 mg by mouth daily.   Yes Historical Provider, MD  aspirin EC 81 MG tablet Take 81 mg by mouth daily.   Yes Historical Provider, MD  atorvastatin (LIPITOR) 20 MG tablet Take 20 mg by mouth daily. 01/27/15  Yes Historical Provider, MD  brimonidine (ALPHAGAN) 0.2 % ophthalmic solution Place 1 drop into both eyes at bedtime.  03/22/14  Yes Historical Provider, MD  famotidine (PEPCID) 20 MG tablet Take 1 tablet (20 mg total) by mouth daily. 10/04/14  Yes Florencia Reasons, MD  polyethylene glycol (MIRALAX / GLYCOLAX) packet Take 17 g by mouth 2 (two) times daily. Patient not taking: Reported on 02/15/2015 10/04/14   Florencia Reasons, MD  sennosides-docusate sodium (SENOKOT-S) 8.6-50 MG tablet Take 1 tablet by mouth 2 (two) times daily. Patient not taking: Reported on 02/15/2015 10/04/14   Florencia Reasons, MD   BP 137/74 mmHg  Pulse 79  Temp(Src) 97.5 F (36.4 C) (Oral)  Ht 5\' 11"  (1.803 m)  Wt 180 lb (81.647 kg)  BMI 25.12 kg/m2  SpO2 100% Physical Exam  Constitutional: He appears well-developed and well-nourished. No distress.  HENT:  Head: Normocephalic and atraumatic.  Eyes: Conjunctivae are normal.  Neck: Neck supple.  Cardiovascular: Normal rate, regular rhythm and normal heart sounds.   Pulmonary/Chest: Effort normal. No respiratory distress. He has no wheezes. He has no  rales.  Abdominal: Soft. Bowel sounds are normal. He exhibits no distension. There is no tenderness. There is no rebound.  Musculoskeletal: He exhibits no edema.  No midline cervical, thoracic, lumbar spine tenderness. Tender to palpation over left posterior lateral shoulder joint. Full range of motion passively, pain with range of motion actively of the left shoulder. Pain with full extension and external rotation. Normal left elbow. Mild tenderness of the biceps of the left arm, strength is intact. No swelling or deformity noted over the left wrist. Diffuse tenderness over wrist joint. Pain with range of motion, full range of motion. Normal hand. Abrasion to the right anterior knee, hemostatic. Tender to palpation over anterior knee joint. No tenderness over medial lateral joint. No tenderness of her posterior knee. Full range of motion of the knee. Negative anterior posterior drawer signs. No laxity with medial lateral stress. Normal hips bilaterally. DP and Distal radial pulses intact and equal  Neurological: He is alert.  Skin: Skin is warm and dry.  Nursing note and vitals reviewed.   ED Course  Procedures (including critical care time) Labs Review Labs Reviewed - No data to display  Imaging Review Dg Wrist Complete Left  02/15/2015   CLINICAL DATA:  Fall, wrist pain.  EXAM: LEFT WRIST - COMPLETE 3+ VIEW  COMPARISON:  None.  FINDINGS: Degenerative changes within the left wrist. No fracture, subluxation or dislocation. Soft tissues are intact.  IMPRESSION: No acute bony abnormality.   Electronically Signed   By: Rolm Baptise M.D.   On: 02/15/2015 08:03   Dg Shoulder Left  02/15/2015   CLINICAL DATA:  Fall yesterday.  Left shoulder pain.  EXAM: LEFT SHOULDER - 2+ VIEW  COMPARISON:  None.  FINDINGS: Advanced osteoarthritic changes in the left [facility] joint and glenohumeral joint. Loss of subacromial space. Spurring along the greater tuberosity. No fracture, subluxation or dislocation.  IMPRESSION: No  acute bony abnormality.   Electronically Signed   By: Rolm Baptise M.D.   On: 02/15/2015 08:02   Dg Knee Complete 4 Views Right  02/15/2015   CLINICAL DATA:  Fall, knee pain.  Abrasions to right anterior knee.  EXAM: RIGHT KNEE - COMPLETE 4+ VIEW  COMPARISON:  04/30/2014  FINDINGS: Moderate to advanced tricompartment degenerative changes with chondrocalcinosis, joint space narrowing and spurring. No acute bony abnormality. Specifically, no fracture, subluxation, or dislocation. Soft tissues are intact. Probable small joint effusion vascular calcifications noted.  IMPRESSION: Moderate to advanced degenerative changes with chondrocalcinosis compatible with CPPD. Small joint effusion. No acute bony abnormality.   Electronically Signed   By: Rolm Baptise M.D.   On: 02/15/2015 08:04     EKG Interpretation None      MDM   Final diagnoses:  None    Patient is here after mechanical fall. Only complaining of left shoulder, left wrist, right knee pain. Will get x-rays. Patient is in no distress otherwise, normal vital signs. No head injury.  8:57 AM X-rays all negative. Discussed with Dr. Rogene Houston, who has seen pt as well. Agrees, patient is stable for discharge home. Follow-up with primary care doctor as needed. Ibuprofen  or Tylenol for pain at home. Bacitracin to the abrasion. Return precautions discussed.  Filed Vitals:   02/15/15 0659 02/15/15 0700 02/15/15 0715 02/15/15 0730  BP: 130/71 130/71 137/74 129/65  Pulse: 80 78 79 78  Temp: 97.5 F (36.4 C)     TempSrc: Oral     Height: 5\' 11"  (1.803 m)     Weight: 180 lb (81.647 kg)     SpO2: 100% 100% 100% 95%     Jeannett Senior, PA-C 02/15/15 [id number]

## 2015-02-15 NOTE — ED Notes (Signed)
4X4 placed on abrasion to right knee wrapped with gauze.

## 2015-02-15 NOTE — Discharge Instructions (Signed)
Your xrays are normal today except for arthritis seen in your right knee.  Tylenol or motrin for pain. Bacitracin to the laceration twice a day. Ice your sore joints several times a day. Follow up with primary care doctor.    Arthralgia Your caregiver has diagnosed you as suffering from an arthralgia. Arthralgia means there is pain in a joint. This can come from many reasons including:  Bruising the joint which causes soreness (inflammation) in the joint.  Wear and tear on the joints which occur as we grow older (osteoarthritis).  Overusing the joint.  Various forms of arthritis.  Infections of the joint. Regardless of the cause of pain in your joint, most of these different pains respond to anti-inflammatory drugs and rest. The exception to this is when a joint is infected, and these cases are treated with antibiotics, if it is a bacterial infection. HOME CARE INSTRUCTIONS   Rest the injured area for as long as directed by your caregiver. Then slowly start using the joint as directed by your caregiver and as the pain allows. Crutches as directed may be useful if the ankles, knees or hips are involved. If the knee was splinted or casted, continue use and care as directed. If an stretchy or elastic wrapping bandage has been applied today, it should be removed and re-applied every 3 to 4 hours. It should not be applied tightly, but firmly enough to keep swelling down. Watch toes and feet for swelling, bluish discoloration, coldness, numbness or excessive pain. If any of these problems (symptoms) occur, remove the ace bandage and re-apply more loosely. If these symptoms persist, contact your caregiver or return to this location.  For the first 24 hours, keep the injured extremity elevated on pillows while lying down.  Apply ice for 15-20 minutes to the sore joint every couple hours while awake for the first half day. Then 03-04 times per day for the first 48 hours. Put the ice in a plastic bag  and place a towel between the bag of ice and your skin.  Wear any splinting, casting, elastic bandage applications, or slings as instructed.  Only take over-the-counter or prescription medicines for pain, discomfort, or fever as directed by your caregiver. Do not use aspirin immediately after the injury unless instructed by your physician. Aspirin can cause increased bleeding and bruising of the tissues.  If you were given crutches, continue to use them as instructed and do not resume weight bearing on the sore joint until instructed. Persistent pain and inability to use the sore joint as directed for more than 2 to 3 days are warning signs indicating that you should see a caregiver for a follow-up visit as soon as possible. Initially, a hairline fracture (break in bone) may not be evident on X-rays. Persistent pain and swelling indicate that further evaluation, non-weight bearing or use of the joint (use of crutches or slings as instructed), or further X-rays are indicated. X-rays may sometimes not show a small fracture until a week or 10 days later. Make a follow-up appointment with your own caregiver or one to whom we have referred you. A radiologist (specialist in reading X-rays) may read your X-rays. Make sure you know how you are to obtain your X-ray results. Do not assume everything is normal if you do not hear from [place]. SEEK MEDICAL CARE IF: Bruising, swelling, or pain increases. SEEK IMMEDIATE MEDICAL CARE IF:   Your fingers or toes are numb or blue.  The pain is not responding  to medications and continues to stay the same or get worse.  The pain in your joint becomes severe.  You develop a fever over 102 F (38.9 C).  It becomes impossible to move or use the joint. MAKE SURE YOU:   Understand these instructions.  Will watch your condition.  Will get help right away if you are not doing well or get worse. Document Released: 08/02/2005 Document Revised: 10/25/2011 Document  Reviewed: 03/20/2008 [facility] Patient Information 2015 [place], [place]. This information is not intended to replace advice given to you by your health care provider. Make sure you discuss any questions you have with your health care provider.  Abrasions An abrasion is a cut or scrape of the skin. Abrasions do not go through all layers of the skin. HOME CARE  If a bandage (dressing) was put on your wound, change it as told by your doctor. If the bandage sticks, soak it off with warm.  Wash the area with water and soap 2 times a day. Rinse off the soap. Pat the area dry with a clean towel.  Put on medicated cream (ointment) as told by your doctor.  Change your bandage right away if it gets wet or dirty.  Only take medicine as told by your doctor.  See your doctor within 24-48 hours to get your wound checked.  Check your wound for redness, puffiness (swelling), or yellowish-white fluid (pus). GET HELP RIGHT AWAY IF:   You have more pain in the wound.  You have redness, swelling, or tenderness around the wound.  You have pus coming from the wound.  You have a fever or lasting symptoms for more than 2-3 days.  You have a fever and your symptoms suddenly get worse.  You have a bad smell coming from the wound or bandage. MAKE SURE YOU:   Understand these instructions.  Will watch your condition.  Will get help right away if you are not doing well or get worse. Document Released: 01/19/2008 Document Revised: 04/26/2012 Document Reviewed: 07/06/2011 [facility] Patient Information 2015 [place], [place]. This information is not intended to replace advice given to you by your health care provider. Make sure you discuss any questions you have with your health care provider.

## 2015-02-15 NOTE — ED Provider Notes (Signed)
Medical screening examination/treatment/procedure(s) were conducted as a shared visit with non-physician practitioner(s) and myself.  I personally evaluated the patient during the encounter.   EKG Interpretation None      Results for orders placed or performed during the hospital encounter of 10/02/14  MRSA PCR Screening  Result Value Ref Range   MRSA by PCR NEGATIVE NEGATIVE  CBC with Differential  Result Value Ref Range   WBC 6.6 4.0 - 10.5 K/uL   RBC 4.06 (L) 4.22 - 5.81 MIL/uL   Hemoglobin 11.5 (L) 13.0 - 17.0 g/dL   HCT 35.3 (L) 39.0 - 52.0 %   MCV 86.9 78.0 - 100.0 fL   MCH 28.3 26.0 - 34.0 pg   MCHC 32.6 30.0 - 36.0 g/dL   RDW 14.9 11.5 - 15.5 %   Platelets 153 150 - 400 K/uL   Neutrophils Relative % 65 43 - 77 %   Neutro Abs 4.4 1.7 - 7.7 K/uL   Lymphocytes Relative 26 12 - 46 %   Lymphs Abs 1.8 0.7 - 4.0 K/uL   Monocytes Relative 6 3 - 12 %   Monocytes Absolute 0.4 0.1 - 1.0 K/uL   Eosinophils Relative 2 0 - 5 %   Eosinophils Absolute 0.1 0.0 - 0.7 K/uL   Basophils Relative 1 0 - 1 %   Basophils Absolute 0.0 0.0 - 0.1 K/uL  Comprehensive metabolic panel  Result Value Ref Range   Sodium 138 135 - 145 mmol/L   Potassium 4.2 3.5 - 5.1 mmol/L   Chloride 106 96 - 112 mmol/L   CO2 28 19 - 32 mmol/L   Glucose, Bld 119 (H) 70 - 99 mg/dL   BUN 14 6 - 23 mg/dL   Creatinine, Ser 0.96 0.50 - 1.35 mg/dL   Calcium 9.0 8.4 - 10.5 mg/dL   Total Protein 6.3 6.0 - 8.3 g/dL   Albumin 3.9 3.5 - 5.2 g/dL   AST 33 0 - 37 U/L   ALT 15 0 - 53 U/L   Alkaline Phosphatase 69 39 - 117 U/L   Total Bilirubin 1.5 (H) 0.3 - 1.2 mg/dL   GFR calc non Af Amer 75 (L) >90 mL/min   GFR calc Af Amer 87 (L) >90 mL/min   Anion gap 4 (L) 5 - 15  Protime-INR  Result Value Ref Range   Prothrombin Time 14.7 11.6 - 15.2 seconds   INR 1.14 0.00 - 1.49  CBC  Result Value Ref Range   WBC 5.3 4.0 - 10.5 K/uL   RBC 3.70 (L) 4.22 - 5.81 MIL/uL   Hemoglobin 10.4 (L) 13.0 - 17.0 g/dL   HCT 31.4 (L)  39.0 - 52.0 %   MCV 84.9 78.0 - 100.0 fL   MCH 28.1 26.0 - 34.0 pg   MCHC 33.1 30.0 - 36.0 g/dL   RDW 14.8 11.5 - 15.5 %   Platelets 146 (L) 150 - 400 K/uL  CBC  Result Value Ref Range   WBC 5.4 4.0 - 10.5 K/uL   RBC 3.76 (L) 4.22 - 5.81 MIL/uL   Hemoglobin 10.6 (L) 13.0 - 17.0 g/dL   HCT 31.9 (L) 39.0 - 52.0 %   MCV 84.8 78.0 - 100.0 fL   MCH 28.2 26.0 - 34.0 pg   MCHC 33.2 30.0 - 36.0 g/dL   RDW 14.7 11.5 - 15.5 %   Platelets 141 (L) 150 - 400 K/uL  CBC  Result Value Ref Range   WBC 6.1 4.0 - 10.5 K/uL  RBC 3.83 (L) 4.22 - 5.81 MIL/uL   Hemoglobin 10.7 (L) 13.0 - 17.0 g/dL   HCT 32.7 (L) 39.0 - 52.0 %   MCV 85.4 78.0 - 100.0 fL   MCH 27.9 26.0 - 34.0 pg   MCHC 32.7 30.0 - 36.0 g/dL   RDW 14.9 11.5 - 15.5 %   Platelets 150 150 - 400 K/uL  CBC  Result Value Ref Range   WBC 5.7 4.0 - 10.5 K/uL   RBC 3.91 (L) 4.22 - 5.81 MIL/uL   Hemoglobin 11.0 (L) 13.0 - 17.0 g/dL   HCT 33.5 (L) 39.0 - 52.0 %   MCV 85.7 78.0 - 100.0 fL   MCH 28.1 26.0 - 34.0 pg   MCHC 32.8 30.0 - 36.0 g/dL   RDW 14.9 11.5 - 15.5 %   Platelets 146 (L) 150 - 400 K/uL  POC occult blood, ED  Result Value Ref Range   Fecal Occult Bld POSITIVE (A) NEGATIVE  POC urine preg, ED (not at [facility])  Result Value Ref Range   Preg Test, Ur POSITIVE (A) NEGATIVE   Dg Wrist Complete Left  02/15/2015   CLINICAL DATA:  Fall, wrist pain.  EXAM: LEFT WRIST - COMPLETE 3+ VIEW  COMPARISON:  None.  FINDINGS: Degenerative changes within the left wrist. No fracture, subluxation or dislocation. Soft tissues are intact.  IMPRESSION: No acute bony abnormality.   Electronically Signed   By: Rolm Baptise M.D.   On: 02/15/2015 08:03   Dg Shoulder Left  02/15/2015   CLINICAL DATA:  Fall yesterday.  Left shoulder pain.  EXAM: LEFT SHOULDER - 2+ VIEW  COMPARISON:  None.  FINDINGS: Advanced osteoarthritic changes in the left [facility] joint and glenohumeral joint. Loss of subacromial space. Spurring along the greater tuberosity. No fracture,  subluxation or dislocation.  IMPRESSION: No acute bony abnormality.   Electronically Signed   By: Rolm Baptise M.D.   On: 02/15/2015 08:02   Dg Knee Complete 4 Views Right  02/15/2015   CLINICAL DATA:  Fall, knee pain.  Abrasions to right anterior knee.  EXAM: RIGHT KNEE - COMPLETE 4+ VIEW  COMPARISON:  04/30/2014  FINDINGS: Moderate to advanced tricompartment degenerative changes with chondrocalcinosis, joint space narrowing and spurring. No acute bony abnormality. Specifically, no fracture, subluxation, or dislocation. Soft tissues are intact. Probable small joint effusion vascular calcifications noted.  IMPRESSION: Moderate to advanced degenerative changes with chondrocalcinosis compatible with CPPD. Small joint effusion. No acute bony abnormality.   Electronically Signed   By: Rolm Baptise M.D.   On: 02/15/2015 08:04    Patient seen by me. Very nice elderly gentleman had a fall yesterday no loss of consciousness did not hit his head. Patient's complete complaint is to the left shoulder left wrist and he has an abrasion to the right knee. No complaints of any pain or back pain. X-rays are negative. Patient has a history of an old the left rotator cuff injury. But no exacerbation of that with this fall. Patient's radial pulse on the left side is 2+ reasonable range of motion of the left hand left wrist and left shoulder.  Gregory Sorrow, MD 02/15/15 [phone]

## 2015-07-09 ENCOUNTER — Other Ambulatory Visit: Payer: Self-pay | Admitting: Internal Medicine

## 2015-07-09 ENCOUNTER — Ambulatory Visit (HOSPITAL_COMMUNITY)
Admission: RE | Admit: 2015-07-09 | Discharge: 2015-07-09 | Disposition: A | Discharge status: Home / Self Care | Payer: Medicare Other | Source: Ambulatory Visit | Attending: Internal Medicine | Admitting: Internal Medicine

## 2015-07-09 DIAGNOSIS — R079 Chest pain, unspecified: Secondary | ICD-10-CM | POA: Insufficient documentation

## 2015-07-09 DIAGNOSIS — I700 Atherosclerosis of aorta: Secondary | ICD-10-CM | POA: Insufficient documentation

## 2015-07-09 DIAGNOSIS — R0602 Shortness of breath: Secondary | ICD-10-CM

## 2016-01-26 ENCOUNTER — Emergency Department (HOSPITAL_COMMUNITY)
Admission: EM | Admit: 2016-01-26 | Discharge: 2016-01-26 | Disposition: A | Discharge status: Home / Self Care | Payer: Medicare Other | Attending: Emergency Medicine | Admitting: Emergency Medicine

## 2016-01-26 ENCOUNTER — Encounter (HOSPITAL_COMMUNITY): Payer: Self-pay | Admitting: *Deleted

## 2016-01-26 DIAGNOSIS — Y999 Unspecified external cause status: Secondary | ICD-10-CM | POA: Diagnosis not present

## 2016-01-26 DIAGNOSIS — E7800 Pure hypercholesterolemia, unspecified: Secondary | ICD-10-CM | POA: Insufficient documentation

## 2016-01-26 DIAGNOSIS — I10 Essential (primary) hypertension: Secondary | ICD-10-CM | POA: Insufficient documentation

## 2016-01-26 DIAGNOSIS — S50862A Insect bite (nonvenomous) of left forearm, initial encounter: Secondary | ICD-10-CM | POA: Insufficient documentation

## 2016-01-26 DIAGNOSIS — Z8673 Personal history of transient ischemic attack (TIA), and cerebral infarction without residual deficits: Secondary | ICD-10-CM | POA: Insufficient documentation

## 2016-01-26 DIAGNOSIS — Z79899 Other long term (current) drug therapy: Secondary | ICD-10-CM | POA: Insufficient documentation

## 2016-01-26 DIAGNOSIS — Z7982 Long term (current) use of aspirin: Secondary | ICD-10-CM | POA: Insufficient documentation

## 2016-01-26 DIAGNOSIS — W57XXXA Bitten or stung by nonvenomous insect and other nonvenomous arthropods, initial encounter: Secondary | ICD-10-CM | POA: Insufficient documentation

## 2016-01-26 DIAGNOSIS — Y929 Unspecified place or not applicable: Secondary | ICD-10-CM | POA: Insufficient documentation

## 2016-01-26 DIAGNOSIS — Y939 Activity, unspecified: Secondary | ICD-10-CM | POA: Diagnosis not present

## 2016-01-26 MED ORDER — DIPHENHYDRAMINE HCL 25 MG PO CAPS
25.0000 mg | ORAL_CAPSULE | Freq: Once | ORAL | Status: AC
  Administered 2016-01-26: 25 mg via ORAL
  Filled 2016-01-26: qty 1

## 2016-01-26 NOTE — ED Notes (Signed)
Pt states around 15 minutes prior to arrival to ED pt was bit by a black spider, unknown if it was a black widow. Pt only reports pain at the site. Site is red and raised, approximately dime size. No other complaints.

## 2016-01-26 NOTE — ED Provider Notes (Signed)
CSN: [id number]     Arrival date & time 01/26/16  1926 History  By signing my name below, I, Evelene Croon, attest that this documentation has been prepared under the direction and in the presence of non-physician practitioner, Margarita Mail, PA-C. Electronically Signed: Evelene Croon, Scribe. 01/26/2016. 11:31 PM.    Chief Complaint  Patient presents with  . Insect Bite    The history is provided by the patient. No language interpreter was used.    HPI Comments:  LEMOND MCKINNEY is a 82 y.o. male who presents to the Emergency Department complaining of an insect bite to his left forearm, sustained ~1800 this evening. Pt states he saw a black spider bite his arm but is unsure what type of spider it was. He reports 5/10 pain to the site with associated swelling.  No alleviating factors noted. Pt has no other complaints or symptoms at this time; denies dizziness, HA, and abdominal pain.   Past Medical History  Diagnosis Date  . Hypertension   . Hypercholesteremia   . Murmur, cardiac   . Arthritis   . Stroke [facility])    Past Surgical History  Procedure Laterality Date  . Esophagogastroduodenoscopy N/A 10/03/2014    Procedure: ESOPHAGOGASTRODUODENOSCOPY (EGD);  Surgeon: Winfield Cunas., MD;  Location: [facility] ENDOSCOPY;  Service: Endoscopy;  Laterality: N/A;  . Colonoscopy with propofol N/A 10/04/2014    Procedure: COLONOSCOPY WITH PROPOFOL;  Surgeon: Laurence Spates, MD;  Location: [place];  Service: Endoscopy;  Laterality: N/A;   History reviewed. No pertinent family history. Social History  Substance Use Topics  . Smoking status: Never Smoker   . Smokeless tobacco: Never Used  . Alcohol Use: No    Review of Systems  Gastrointestinal: Negative for abdominal pain.  Skin: Positive for wound (insect bite).  Neurological: Negative for dizziness and headaches.   Allergies  Sulfa antibiotics and Ramipril  Home Medications   Prior to Admission medications   Medication Sig Start  Date End Date Taking? Authorizing Provider  amLODipine (NORVASC) 5 MG tablet Take 5 mg by mouth daily.    Historical Provider, MD  aspirin EC 81 MG tablet Take 81 mg by mouth daily.    Historical Provider, MD  atorvastatin (LIPITOR) 20 MG tablet Take 20 mg by mouth daily. 01/27/15   Historical Provider, MD  brimonidine (ALPHAGAN) 0.2 % ophthalmic solution Place 1 drop into both eyes at bedtime.  03/22/14   Historical Provider, MD  famotidine (PEPCID) 20 MG tablet Take 1 tablet (20 mg total) by mouth daily. 10/04/14   Florencia Reasons, MD  polyethylene glycol (MIRALAX / Floria Raveling) packet Take 17 g by mouth 2 (two) times daily. Patient not taking: Reported on 02/15/2015 10/04/14   Florencia Reasons, MD  sennosides-docusate sodium (SENOKOT-S) 8.6-50 MG tablet Take 1 tablet by mouth 2 (two) times daily. Patient not taking: Reported on 02/15/2015 10/04/14   Florencia Reasons, MD   There were no vitals taken for this visit. Physical Exam  Constitutional: He is oriented to person, place, and time. He appears well-developed and well-nourished. No distress.  HENT:  Head: Normocephalic and atraumatic.  Eyes: Conjunctivae are normal.  Cardiovascular: Normal rate.   Pulmonary/Chest: Effort normal.  Abdominal: He exhibits no distension.  Neurological: He is alert and oriented to person, place, and time.  Skin: Skin is warm and dry.  small weal to left forearm   Psychiatric: He has a normal mood and affect.  Nursing note and vitals reviewed.   ED Course  Procedures  DIAGNOSTIC STUDIES:  Oxygen Saturation is 100% on RA, NORMAL by my interpretation.    COORDINATION OF CARE:  11:21 PM Discussed treatment plan with pt at bedside and pt agreed to plan.   MDM   Final diagnoses:  None    11:29 PM BP 165/90 mmHg  Pulse 65  Temp(Src) 97.7 F (36.5 C) (Oral)  Resp 18  SpO2 100%   Patient with small weal consistent with insect bite, no neurologic symptoms, abdominal pain, cramping, severe tenderness or signs of infection.  Gout. There is actual concern for bite by a black widow spider. No signs of necrosis or compartment syndrome. Patient appears safe for discharge. Discussed return precautions with the patient. I personally performed the services described in this documentation, which was scribed in my presence. The recorded information has been reviewed and is accurate.      Margarita Mail, PA-C 01/27/16 [place], MD 01/27/16 [phone]

## 2016-01-26 NOTE — Discharge Instructions (Signed)
Insect Bite Mosquitoes, flies, fleas, bedbugs, and many other insects can bite. Insect bites are different from insect stings. A sting is when poison (venom) is injected into the skin. Insect bites can cause pain or itching for a few days, but they are usually not serious. Some insects can spread diseases to people through a bite. SYMPTOMS  Symptoms of an insect bite include:  Itching or pain in the bite area.  Redness and swelling in the bite area.  An open wound (skin ulcer). In many cases, symptoms last for 2-4 days.  DIAGNOSIS  This condition is usually diagnosed based on symptoms and a physical exam. TREATMENT  Treatment is usually not needed for an insect bite. Symptoms often go away on their own. Your health care provider may recommend creams or lotions to help reduce itching. Antibiotic medicines may be prescribed if the bite becomes infected. A tetanus shot may be given in some cases. If you develop an allergic reaction to an insect bite, your health care provider will prescribe medicines to treat the reaction (antihistamines). This is rare. HOME CARE INSTRUCTIONS  Do not scratch the bite area.  Keep the bite area clean and dry. Wash the bite area daily with soap and water as told by your health care provider.  If directed, applyice to the bite area.  Put ice in a plastic bag.  Place a towel between your skin and the bag.  Leave the ice on for 20 minutes, 2-3 times per day.  To help reduce itching and swelling, try applying a baking soda paste, cortisone cream, or calamine lotion to the bite area as told by your health care provider.  Apply or take over-the-counter and prescription medicines only as told by your health care provider.  If you were prescribed an antibiotic medicine, use it as told by your health care provider. Do not stop using the antibiotic even if your condition improves.  Keep all follow-up visits as told by your health care provider. This is  important. PREVENTION   Use insect repellent. The best insect repellents contain:  DEET, picaridin, oil of lemon eucalyptus (OLE), or IR3535.  Higher amounts of an active ingredient.  When you are outdoors, wear clothing that covers your arms and legs.  Avoid opening windows that do not have window screens. SEEK MEDICAL CARE IF:  You have increased redness, swelling, or pain in the bite area.  You have a fever. SEEK IMMEDIATE MEDICAL CARE IF:   You have joint pain.   You have fluid, blood, or pus coming from the bite area.  You have a headache or neck pain.  You have unusual weakness.  You have a rash.  You have chest pain or shortness of breath.  You have abdominal pain, nausea, or vomiting.  You feel unusually tired or sleepy.   This information is not intended to replace advice given to you by your health care provider. Make sure you discuss any questions you have with your health care provider.   Document Released: 09/09/2004 Document Revised: 04/23/2015 Document Reviewed: 12/18/2014 Elsevier Interactive Patient Education 2016 Elsevier Inc.  

## 2016-03-16 DIAGNOSIS — I213 ST elevation (STEMI) myocardial infarction of unspecified site: Secondary | ICD-10-CM

## 2016-03-16 HISTORY — DX: ST elevation (STEMI) myocardial infarction of unspecified site: I21.3

## 2016-03-26 ENCOUNTER — Inpatient Hospital Stay (HOSPITAL_COMMUNITY)
Admission: EM | Admit: 2016-03-26 | Discharge: 2016-04-01 | DRG: 281 | Disposition: A | Discharge status: Home / Self Care | Payer: Medicare Other | Attending: Cardiovascular Disease | Admitting: Cardiovascular Disease

## 2016-03-26 ENCOUNTER — Encounter (HOSPITAL_COMMUNITY)
Admission: EM | Disposition: A | Discharge status: Home / Self Care | Payer: Self-pay | Source: Home / Self Care | Attending: Cardiovascular Disease

## 2016-03-26 ENCOUNTER — Inpatient Hospital Stay (HOSPITAL_COMMUNITY): Payer: Medicare Other

## 2016-03-26 ENCOUNTER — Encounter (HOSPITAL_COMMUNITY): Payer: Self-pay | Admitting: Emergency Medicine

## 2016-03-26 ENCOUNTER — Emergency Department (HOSPITAL_COMMUNITY): Payer: Medicare Other

## 2016-03-26 DIAGNOSIS — T79A3XA Traumatic compartment syndrome of abdomen, initial encounter: Secondary | ICD-10-CM | POA: Diagnosis present

## 2016-03-26 DIAGNOSIS — Z79899 Other long term (current) drug therapy: Secondary | ICD-10-CM

## 2016-03-26 DIAGNOSIS — I2510 Atherosclerotic heart disease of native coronary artery without angina pectoris: Secondary | ICD-10-CM | POA: Diagnosis present

## 2016-03-26 DIAGNOSIS — I214 Non-ST elevation (NSTEMI) myocardial infarction: Principal | ICD-10-CM | POA: Insufficient documentation

## 2016-03-26 DIAGNOSIS — I10 Essential (primary) hypertension: Secondary | ICD-10-CM | POA: Diagnosis present

## 2016-03-26 DIAGNOSIS — I209 Angina pectoris, unspecified: Secondary | ICD-10-CM | POA: Diagnosis not present

## 2016-03-26 DIAGNOSIS — I480 Paroxysmal atrial fibrillation: Secondary | ICD-10-CM

## 2016-03-26 DIAGNOSIS — E785 Hyperlipidemia, unspecified: Secondary | ICD-10-CM | POA: Diagnosis present

## 2016-03-26 DIAGNOSIS — I2584 Coronary atherosclerosis due to calcified coronary lesion: Secondary | ICD-10-CM | POA: Diagnosis present

## 2016-03-26 DIAGNOSIS — I213 ST elevation (STEMI) myocardial infarction of unspecified site: Secondary | ICD-10-CM

## 2016-03-26 DIAGNOSIS — Z8673 Personal history of transient ischemic attack (TIA), and cerebral infarction without residual deficits: Secondary | ICD-10-CM

## 2016-03-26 DIAGNOSIS — R079 Chest pain, unspecified: Secondary | ICD-10-CM

## 2016-03-26 DIAGNOSIS — I249 Acute ischemic heart disease, unspecified: Secondary | ICD-10-CM | POA: Diagnosis not present

## 2016-03-26 DIAGNOSIS — I517 Cardiomegaly: Secondary | ICD-10-CM | POA: Diagnosis not present

## 2016-03-26 DIAGNOSIS — I2121 ST elevation (STEMI) myocardial infarction involving left circumflex coronary artery: Secondary | ICD-10-CM | POA: Diagnosis not present

## 2016-03-26 DIAGNOSIS — E871 Hypo-osmolality and hyponatremia: Secondary | ICD-10-CM | POA: Diagnosis not present

## 2016-03-26 DIAGNOSIS — Z23 Encounter for immunization: Secondary | ICD-10-CM

## 2016-03-26 DIAGNOSIS — R0602 Shortness of breath: Secondary | ICD-10-CM

## 2016-03-26 DIAGNOSIS — I4510 Unspecified right bundle-branch block: Secondary | ICD-10-CM | POA: Diagnosis present

## 2016-03-26 HISTORY — DX: Atherosclerotic heart disease of native coronary artery without angina pectoris: I25.10

## 2016-03-26 HISTORY — DX: Paroxysmal atrial fibrillation: I48.0

## 2016-03-26 HISTORY — PX: CARDIAC CATHETERIZATION: SHX172

## 2016-03-26 HISTORY — DX: Gastrointestinal hemorrhage, unspecified: K92.2

## 2016-03-26 LAB — CBC
HCT: 46.4 % (ref 39.0–52.0)
HEMATOCRIT: 49.2 % (ref 39.0–52.0)
HEMATOCRIT: 43.5 % (ref 39.0–52.0)
HEMOGLOBIN: 16.4 g/dL (ref 13.0–17.0)
Hemoglobin: 14.8 g/dL (ref 13.0–17.0)
Hemoglobin: 15.1 g/dL (ref 13.0–17.0)
MCH: 29.7 pg (ref 26.0–34.0)
MCH: 30 pg (ref 26.0–34.0)
MCH: 29.4 pg (ref 26.0–34.0)
MCHC: 33.3 g/dL (ref 30.0–36.0)
MCHC: 34 g/dL (ref 30.0–36.0)
MCHC: 32.5 g/dL (ref 30.0–36.0)
MCV: 89 fL (ref 78.0–100.0)
MCV: 88.2 fL (ref 78.0–100.0)
MCV: 90.4 fL (ref 78.0–100.0)
PLATELETS: 138 10*3/uL — AB (ref 150–400)
Platelets: 149 10*3/uL — ABNORMAL LOW (ref 150–400)
Platelets: 122 K/uL — ABNORMAL LOW (ref 150–400)
RBC: 5.53 MIL/uL (ref 4.22–5.81)
RBC: 4.93 MIL/uL (ref 4.22–5.81)
RBC: 5.13 MIL/uL (ref 4.22–5.81)
RDW: 14.8 % (ref 11.5–15.5)
RDW: 15.1 % (ref 11.5–15.5)
RDW: 15 % (ref 11.5–15.5)
WBC: 10.7 10*3/uL — ABNORMAL HIGH (ref 4.0–10.5)
WBC: 10.9 10*3/uL — AB (ref 4.0–10.5)
WBC: 11.7 K/uL — ABNORMAL HIGH (ref 4.0–10.5)

## 2016-03-26 LAB — DIFFERENTIAL
Basophils Absolute: 0 10*3/uL (ref 0.0–0.1)
Basophils Relative: 0 %
EOS ABS: 0 10*3/uL (ref 0.0–0.7)
EOS PCT: 0 %
LYMPHS ABS: 1.3 10*3/uL (ref 0.7–4.0)
Lymphocytes Relative: 12 %
MONO ABS: 0.6 10*3/uL (ref 0.1–1.0)
MONOS PCT: 6 %
Neutro Abs: 8.7 10*3/uL — ABNORMAL HIGH (ref 1.7–7.7)
Neutrophils Relative %: 82 %

## 2016-03-26 LAB — POCT I-STAT, CHEM 8
BUN: 9 mg/dL (ref 6–20)
CALCIUM ION: 1.28 mmol/L — AB (ref 1.12–1.23)
CREATININE: 0.7 mg/dL (ref 0.61–1.24)
Chloride: 97 mmol/L — ABNORMAL LOW (ref 101–111)
GLUCOSE: 179 mg/dL — AB (ref 65–99)
HCT: 51 % (ref 39.0–52.0)
HEMOGLOBIN: 17.3 g/dL — AB (ref 13.0–17.0)
Potassium: 3.3 mmol/L — ABNORMAL LOW (ref 3.5–5.1)
Sodium: 141 mmol/L (ref 135–145)
TCO2: 24 mmol/L (ref 0–100)

## 2016-03-26 LAB — LIPID PANEL
Cholesterol: 140 mg/dL (ref 0–200)
HDL: 67 mg/dL (ref >40)
LDL CALC: 65 mg/dL (ref 0–99)
TRIGLYCERIDES: 39 mg/dL (ref <150)
Total CHOL/HDL Ratio: 2.1 RATIO
VLDL: 8 mg/dL (ref 0–40)

## 2016-03-26 LAB — COMPREHENSIVE METABOLIC PANEL
ALK PHOS: 84 U/L (ref 38–126)
ALT: 30 U/L (ref 17–63)
ANION GAP: 11 (ref 5–15)
AST: 62 U/L — AB (ref 15–41)
Albumin: 4 g/dL (ref 3.5–5.0)
BILIRUBIN TOTAL: 3.7 mg/dL — AB (ref 0.3–1.2)
BUN: 8 mg/dL (ref 6–20)
CALCIUM: 9.4 mg/dL (ref 8.9–10.3)
CO2: 25 mmol/L (ref 22–32)
Chloride: 98 mmol/L — ABNORMAL LOW (ref 101–111)
Creatinine, Ser: 0.95 mg/dL (ref 0.61–1.24)
GFR calc Af Amer: >60 mL/min (ref >60)
GFR calc non Af Amer: >60 mL/min (ref >60)
GLUCOSE: 186 mg/dL — AB (ref 65–99)
POTASSIUM: 3.9 mmol/L (ref 3.5–5.1)
Sodium: 134 mmol/L — ABNORMAL LOW (ref 135–145)
Total Protein: 6.8 g/dL (ref 6.5–8.1)

## 2016-03-26 LAB — ECHOCARDIOGRAM COMPLETE
Height: 71 in
Weight: 2768.98 oz

## 2016-03-26 LAB — BASIC METABOLIC PANEL
Anion gap: 9 (ref 5–15)
BUN: 8 mg/dL (ref 6–20)
CALCIUM: 9 mg/dL (ref 8.9–10.3)
CO2: 25 mmol/L (ref 22–32)
CREATININE: 0.76 mg/dL (ref 0.61–1.24)
Chloride: 102 mmol/L (ref 101–111)
GFR calc Af Amer: >60 mL/min (ref >60)
Glucose, Bld: 111 mg/dL — ABNORMAL HIGH (ref 65–99)
Potassium: 3.8 mmol/L (ref 3.5–5.1)
SODIUM: 136 mmol/L (ref 135–145)

## 2016-03-26 LAB — CREATININE, SERUM
Creatinine, Ser: 0.68 mg/dL (ref 0.61–1.24)
GFR calc non Af Amer: >60 mL/min (ref >60)

## 2016-03-26 LAB — POCT ACTIVATED CLOTTING TIME
ACTIVATED CLOTTING TIME: 147 s
Activated Clotting Time: 230 seconds

## 2016-03-26 LAB — POCT I-STAT TROPONIN I: TROPONIN I, POC: 3.8 ng/mL — AB (ref 0.00–0.08)

## 2016-03-26 LAB — TROPONIN I: Troponin I: 5.31 ng/mL (ref <0.03)

## 2016-03-26 LAB — PROTIME-INR
INR: 1.17
Prothrombin Time: 15 seconds (ref 11.4–15.2)

## 2016-03-26 LAB — APTT: APTT: 31 s (ref 24–36)

## 2016-03-26 LAB — MRSA PCR SCREENING: MRSA by PCR: NEGATIVE

## 2016-03-26 SURGERY — LEFT HEART CATH AND CORONARY ANGIOGRAPHY
Anesthesia: LOCAL

## 2016-03-26 MED ORDER — NITROGLYCERIN IN D5W 200-5 MCG/ML-% IV SOLN
INTRAVENOUS | Status: AC
  Filled 2016-03-26: qty 250

## 2016-03-26 MED ORDER — HEPARIN (PORCINE) IN NACL 2-0.9 UNIT/ML-% IJ SOLN
INTRAMUSCULAR | Status: AC
  Filled 2016-03-26: qty 500

## 2016-03-26 MED ORDER — SODIUM CHLORIDE 0.9 % IV SOLN
10.0000 mL/h | INTRAVENOUS | Status: DC
  Administered 2016-03-26: 20 mL/h via INTRAVENOUS

## 2016-03-26 MED ORDER — ATORVASTATIN CALCIUM 80 MG PO TABS
80.0000 mg | ORAL_TABLET | Freq: Every day | ORAL | Status: DC
  Administered 2016-03-26 – 2016-03-31 (×6): 80 mg via ORAL
  Filled 2016-03-26 (×6): qty 1

## 2016-03-26 MED ORDER — HEPARIN (PORCINE) IN NACL 2-0.9 UNIT/ML-% IJ SOLN
INTRAMUSCULAR | Status: DC | PRN
  Administered 2016-03-26: 1000 mL via INTRA_ARTERIAL
  Administered 2016-03-26: 500 mL via INTRA_ARTERIAL

## 2016-03-26 MED ORDER — ASPIRIN 81 MG PO CHEW
81.0000 mg | CHEWABLE_TABLET | Freq: Every day | ORAL | Status: DC
  Administered 2016-03-27 – 2016-04-01 (×6): 81 mg via ORAL
  Filled 2016-03-26 (×6): qty 1

## 2016-03-26 MED ORDER — SODIUM CHLORIDE 0.9 % IV SOLN: 250.0000 mL | INTRAVENOUS | Status: DC | PRN

## 2016-03-26 MED ORDER — ASPIRIN 81 MG PO CHEW
324.0000 mg | CHEWABLE_TABLET | Freq: Once | ORAL | Status: AC
  Administered 2016-03-26: 324 mg via ORAL
  Filled 2016-03-26: qty 4

## 2016-03-26 MED ORDER — PERFLUTREN LIPID MICROSPHERE
INTRAVENOUS | Status: AC
  Administered 2016-03-26: 2 mL
  Filled 2016-03-26: qty 10

## 2016-03-26 MED ORDER — ENOXAPARIN SODIUM 40 MG/0.4ML ~~LOC~~ SOLN
40.0000 mg | SUBCUTANEOUS | Status: DC
  Administered 2016-03-27 – 2016-03-29 (×3): 40 mg via SUBCUTANEOUS
  Filled 2016-03-26 (×3): qty 0.4

## 2016-03-26 MED ORDER — IOPAMIDOL (ISOVUE-370) INJECTION 76%
INTRAVENOUS | Status: DC | PRN
  Administered 2016-03-26: 95 mL via INTRA_ARTERIAL

## 2016-03-26 MED ORDER — NITROGLYCERIN IN D5W 200-5 MCG/ML-% IV SOLN
INTRAVENOUS | Status: DC | PRN
  Administered 2016-03-26: 10 ug/min via INTRAVENOUS

## 2016-03-26 MED ORDER — HEPARIN SODIUM (PORCINE) 5000 UNIT/ML IJ SOLN
4000.0000 [IU] | INTRAMUSCULAR | Status: AC
  Administered 2016-03-26: 4000 [IU] via INTRAVENOUS
  Filled 2016-03-26: qty 1

## 2016-03-26 MED ORDER — LIDOCAINE HCL (PF) 1 % IJ SOLN
INTRAMUSCULAR | Status: AC
  Filled 2016-03-26: qty 30

## 2016-03-26 MED ORDER — NITROGLYCERIN IN D5W 200-5 MCG/ML-% IV SOLN
0.0000 ug/min | INTRAVENOUS | Status: DC
  Administered 2016-03-26: 30 ug/min via INTRAVENOUS
  Administered 2016-03-27: 50 ug/min via INTRAVENOUS
  Administered 2016-03-27: 40 ug/min via INTRAVENOUS
  Filled 2016-03-26: qty 250

## 2016-03-26 MED ORDER — ACETAMINOPHEN 325 MG PO TABS
650.0000 mg | ORAL_TABLET | ORAL | Status: DC | PRN
  Filled 2016-03-26: qty 2

## 2016-03-26 MED ORDER — MIDAZOLAM HCL 2 MG/2ML IJ SOLN
INTRAMUSCULAR | Status: AC
  Filled 2016-03-26: qty 2

## 2016-03-26 MED ORDER — POTASSIUM CHLORIDE 10 MEQ/100ML IV SOLN
INTRAVENOUS | Status: AC
  Filled 2016-03-26: qty 100

## 2016-03-26 MED ORDER — SODIUM CHLORIDE 0.9% FLUSH: 3.0000 mL | INTRAVENOUS | Status: DC | PRN

## 2016-03-26 MED ORDER — IOPAMIDOL (ISOVUE-370) INJECTION 76%
INTRAVENOUS | Status: AC
  Filled 2016-03-26: qty 125

## 2016-03-26 MED ORDER — PNEUMOCOCCAL VAC POLYVALENT 25 MCG/0.5ML IJ INJ
0.5000 mL | INJECTION | INTRAMUSCULAR | Status: AC
  Administered 2016-03-28: 0.5 mL via INTRAMUSCULAR
  Filled 2016-03-26: qty 0.5

## 2016-03-26 MED ORDER — FENTANYL CITRATE (PF) 100 MCG/2ML IJ SOLN
INTRAMUSCULAR | Status: DC | PRN
  Administered 2016-03-26: 25 ug via INTRAVENOUS

## 2016-03-26 MED ORDER — HEPARIN (PORCINE) IN NACL 2-0.9 UNIT/ML-% IJ SOLN
INTRAMUSCULAR | Status: AC
  Filled 2016-03-26: qty 1000

## 2016-03-26 MED ORDER — MIDAZOLAM HCL 2 MG/2ML IJ SOLN
INTRAMUSCULAR | Status: DC | PRN
  Administered 2016-03-26: 1 mg via INTRAVENOUS

## 2016-03-26 MED ORDER — SODIUM CHLORIDE 0.9% FLUSH
3.0000 mL | Freq: Two times a day (BID) | INTRAVENOUS | Status: DC
  Administered 2016-03-26 – 2016-04-01 (×11): 3 mL via INTRAVENOUS

## 2016-03-26 MED ORDER — FENTANYL CITRATE (PF) 100 MCG/2ML IJ SOLN
INTRAMUSCULAR | Status: AC
  Filled 2016-03-26: qty 2

## 2016-03-26 MED ORDER — ONDANSETRON HCL 4 MG/2ML IJ SOLN: 4.0000 mg | Freq: Four times a day (QID) | INTRAMUSCULAR | Status: DC | PRN

## 2016-03-26 MED ORDER — SODIUM CHLORIDE 0.9 % IV SOLN
INTRAVENOUS | Status: DC
  Administered 2016-03-26 (×3): via INTRAVENOUS

## 2016-03-26 MED ORDER — MORPHINE SULFATE (PF) 2 MG/ML IV SOLN
2.0000 mg | INTRAVENOUS | Status: DC | PRN
  Administered 2016-03-26 (×3): 2 mg via INTRAVENOUS
  Filled 2016-03-26 (×3): qty 1

## 2016-03-26 MED ORDER — ASPIRIN EC 81 MG PO TBEC: 81.0000 mg | DELAYED_RELEASE_TABLET | Freq: Every day | ORAL | Status: DC

## 2016-03-26 SURGICAL SUPPLY — 9 items
CATH INFINITI 5FR JL5 (CATHETERS) ×1 IMPLANT
CATH INFINITI 5FR MULTPACK ANG (CATHETERS) ×1 IMPLANT
KIT HEART LEFT (KITS) ×2 IMPLANT
PACK CARDIAC CATHETERIZATION (CUSTOM PROCEDURE TRAY) ×2 IMPLANT
SHEATH PINNACLE 6F 10CM (SHEATH) ×1 IMPLANT
SYR MEDRAD MARK V 150ML (SYRINGE) ×2 IMPLANT
TRANSDUCER W/STOPCOCK (MISCELLANEOUS) ×2 IMPLANT
TUBING CIL FLEX 10 FLL-RA (TUBING) ×2 IMPLANT
WIRE EMERALD 3MM-J .035X150CM (WIRE) ×1 IMPLANT

## 2016-03-26 NOTE — H&P (Signed)
Patient ID: Gregory [name] MRN: [id number], DOB/AGE: Oct 05, 1929   Admit date: 03/26/2016   Primary Physician: [name], MD Primary Cardiologist: New (Dr. [name])  Pt. Profile:  82 y/o AAM with h/o HTN and HLD but no other known cardiac risk factors, prior nonhemorrhagic CVA in 2015 and GIB in 2016 who presented to [facility] with CP and EKG abnormalities meeting STEMI criteria (new RBBB with slight ST elevation).   Problem List  Past Medical History:  Diagnosis Date  . Arthritis   . Hypercholesteremia   . Hypertension   . Murmur, cardiac   . Stroke [facility])     Past Surgical History:  Procedure Laterality Date  . COLONOSCOPY WITH PROPOFOL N/A 10/04/2014   Procedure: COLONOSCOPY WITH PROPOFOL;  Surgeon: Laurence Spates, MD;  Location: [place];  Service: Endoscopy;  Laterality: N/A;  . ESOPHAGOGASTRODUODENOSCOPY N/A 10/03/2014   Procedure: ESOPHAGOGASTRODUODENOSCOPY (EGD);  Surgeon: Winfield Cunas., MD;  Location: [facility] ENDOSCOPY;  Service: Endoscopy;  Laterality: N/A;     Allergies  Allergies  Allergen Reactions  . Sulfa Antibiotics   . Ramipril     HPI  82 y/o AAM with h/o HTN and HLD but no other known cardiac risk factors, prior nonhemorrhagic CVA in 2015 and GIB in 2016 who presented to [facility] with CP and EKG abnormalities meeting STEMI criteria (new RBBB with slight ST elevation).   Per records, he was treated at [facility] for his CVA in 2015. CT and MRI w/o evidence of hemorrhage. Acute infarcts in the deep white matter of the left cerebral hemisphere noted.  He was started on Plavix. Crestor continued for HLD.  His GIB in 2016 was also treated at [facility]. He had several days of melena. Hgb was 11.5. EGD showed gastritis but no active bleed. Colonoscopy with diverticulosis, small polyps s/p biopsy. Received ppi bid/vif in the hospital. Discharged with pepcid 20mg  po qd. Patient instructed patient to avoid goody powder/avoid NSAIDs. Plavix was resumed at time of discharge per GI  (no longer listed in med list).   He is followed medically by Dr. Jeanann Lewandowsky. No known cardiac history. He was in his usual state of health until night of 03/25/16, when he developed substernal chest pain off and on. It persisted through the night. He had mild associated dyspnea and nausea. This morning, he took 1 baby ASA at home and drove himself to the ED. On arrival EKG showed new RBBB with slight ST elevations. Code STEMI was activated and he was given 3 additional baby ASA and 4,00 units of heparin. He has now been transferred to the [facility] cath lab for emergent [facility]. On arrival, he is will continued CP. BP elevated at 146/104.   Home Medications  Prior to Admission medications   Medication Sig Start Date End Date Taking? Authorizing Provider  amLODipine (NORVASC) 5 MG tablet Take 5 mg by mouth daily.    Historical Provider, MD  aspirin EC 81 MG tablet Take 81 mg by mouth daily.    Historical Provider, MD  atorvastatin (LIPITOR) 20 MG tablet Take 20 mg by mouth daily. 01/27/15   Historical Provider, MD  brimonidine (ALPHAGAN) 0.2 % ophthalmic solution Place 1 drop into both eyes at bedtime.  03/22/14   Historical Provider, MD  famotidine (PEPCID) 20 MG tablet Take 1 tablet (20 mg total) by mouth daily. 10/04/14   Florencia Reasons, MD  polyethylene glycol (MIRALAX / Floria Raveling) packet Take 17 g by mouth 2 (two) times daily. Patient not taking:  Reported on 02/15/2015 10/04/14   Florencia Reasons, MD  sennosides-docusate sodium (SENOKOT-S) 8.6-50 MG tablet Take 1 tablet by mouth 2 (two) times daily. Patient not taking: Reported on 02/15/2015 10/04/14   Florencia Reasons, MD    Family History  Family History  Problem Relation Age of Onset  . Hypertension Mother     Social History  Social History   Social History  . Marital status: Divorced    Spouse name: N/A  . Number of children: N/A  . Years of education: N/A   Occupational History  . Not on file.   Social History Main Topics  . Smoking status: Never Smoker  .  Smokeless tobacco: Never Used  . Alcohol use No  . Drug use: No  . Sexual activity: Yes   Other Topics Concern  . Not on file   Social History Narrative  . No narrative on file     Review of Systems General:  No chills, fever, night sweats or weight changes.  Cardiovascular:  No chest pain, dyspnea on exertion, edema, orthopnea, palpitations, paroxysmal nocturnal dyspnea. Dermatological: No rash, lesions/masses Respiratory: No cough, dyspnea Urologic: No hematuria, dysuria Abdominal:   No nausea, vomiting, diarrhea, bright red blood per rectum, melena, or hematemesis Neurologic:  No visual changes, wkns, changes in mental status. All other systems reviewed and are otherwise negative except as noted above.  Physical Exam  Blood pressure (!) 146/104, pulse 97, temperature 98.1 F (36.7 C), temperature source Oral, resp. rate 17, height 5\' 11"  (1.803 m), weight 172 lb 6 oz (78.2 kg), SpO2 100 %.  General: Pleasant, NAD, elderly  Psych: Normal affect. Neuro: Alert and oriented X 3. Moves all extremities spontaneously. HEENT: Normal  Neck: Supple without bruits or JVD. Lungs:  Resp regular and unlabored, CTA. Heart: RRR no s3, s4, or murmurs. Abdomen: Soft, non-tender, non-distended, BS + x 4.  Extremities: No clubbing, cyanosis or edema. DP/PT/Radials 2+ and equal bilaterally.  Labs  Troponin [facility] Test)  Recent Labs  03/26/16 0829  TROPIPOC 3.80*    Recent Labs  03/26/16 0815  TROPONINI 5.31*   Lab Results  Component Value Date   WBC 10.7 (H) 03/26/2016   HGB 16.4 03/26/2016   HCT 49.2 03/26/2016   MCV 89.0 03/26/2016   PLT 149 (L) 03/26/2016     Recent Labs Lab 03/26/16 0815  NA 134*  K 3.9  CL 98*  CO2 25  BUN 8  CREATININE 0.95  CALCIUM 9.4  PROT 6.8  BILITOT 3.7*  ALKPHOS 84  ALT 30  AST 62*  GLUCOSE 186*   Lab Results  Component Value Date   CHOL 117 01/31/2014   HDL 55 01/31/2014   LDLCALC 51 01/31/2014   TRIG 53 01/31/2014    No results found for: DDIMER   Radiology/Studies  No results found.  ECG  New RBBB w/ slight ST elevations   ASSESSMENT AND PLAN  Principal Problem:   STEMI (ST elevation myocardial infarction) ([place]) Active Problems:   Hypertension   Hyperlipidemia   H/O: CVA (cerebrovascular accident)   1. ACS: Emergent LHC per Dr. [name] shows disease but no severe obstruction ~50% distal left main, ~70% ostial circ. No indication for emergent PCI at this time. Will treat medially and will consider possible surgical consult, given moderate LM and LCx disease. Cycle enzymes x 3. 2D echo. Continue ASA. High intensity statin. FLP in the am. Hgb A1C. BB and ACE/ARB if BP/HR and renal function allows.  Signed, Lyda Jester, PA-C 03/26/2016, 9:06 AM  Patient seen and examined. Agree with assessment and plan.  [name] is an 82 year old African-American male who has a history of hypertension as well as prior nonhemorrhagic CVA.  In addition to remote history of GI bleeding.  The patient developed chest pain last evening.  Chest pain had persisted throughout the night.  [facility] and upon arrival.  His ECG showed new right bundle branch block with slight ST elevation in V2 and V3.  A code STEMI was activated and he presents to the catheterization laboratory for urgent cardiac catheterization.  His initial troponin is positive for probable non-STEMI/ACS.  Urgent catheterization was performed after discussing the procedure with the patient.  Catheterization did not reveal any acute occlusion and showed calcified vessels with 50% distal left main, 75% ostial circumflex, 50% distal circumflex, and 60-70% distal RCA with 50% PDA stenosis with TIMI-3 flow in all vessels.  The patient will initially be managed with an aggressive  medical strtegy. He will be admitted to the 2900, stepdown unit for close follow-up evaluation and management.   Troy Sine,  MD, [facility] 03/26/2016 4:46 PM

## 2016-03-26 NOTE — ED Triage Notes (Signed)
Pt. Stated, I've had some chest pain with some nausea.This started last night.

## 2016-03-26 NOTE — Progress Notes (Signed)
  Echocardiogram 2D Echocardiogram with Definity has been performed.  Darlina Sicilian M 03/26/2016, 2:35 PM

## 2016-03-26 NOTE — ED Notes (Signed)
Paged Dr. [name] to [phone]

## 2016-03-26 NOTE — ED Provider Notes (Signed)
[place] DEPT Provider Note   CSN: [id number] Arrival date & time: 03/26/16  0801  First Provider Contact:  None       History   Chief Complaint Chief Complaint  Patient presents with  . Chest Pain    HPI Gregory [name] is a 82 y.o. male.  82 year old male with past medical history of hypertension, hyperlipidemia, stroke, who presents with chest pain. The patient states his symptoms started yesterday as a dull aching substernal chest pressure at night. He was unable to sleep tonight due to chest pain. On getting up this morning he noticed that his pain was worse, particularly with exertion. He then developed shortness of breath, chest pain, and diaphoresis. He also developed nausea but no vomiting. He says when presents for evaluation   The history is provided by the patient and medical records.      Past Medical History:  Diagnosis Date  . Arthritis   . Hypercholesteremia   . Hypertension   . Murmur, cardiac   . Stroke [facility])     Patient Active Problem List   Diagnosis Date Noted  . STEMI (ST elevation myocardial infarction) ([place]) 03/26/2016  . ACS (abdominal compartment syndrome) 03/26/2016  . Acute coronary syndrome ([place])   . Chest pain   . GI bleed 10/03/2014  . H/O: CVA (cerebrovascular accident)   . CVA (cerebral infarction) 01/30/2014  . Right arm weakness 01/30/2014  . Hypertension 12/05/2010  . Hyperlipidemia 12/05/2010    Past Surgical History:  Procedure Laterality Date  . COLONOSCOPY WITH PROPOFOL N/A 10/04/2014   Procedure: COLONOSCOPY WITH PROPOFOL;  Surgeon: Laurence Spates, MD;  Location: [place];  Service: Endoscopy;  Laterality: N/A;  . ESOPHAGOGASTRODUODENOSCOPY N/A 10/03/2014   Procedure: ESOPHAGOGASTRODUODENOSCOPY (EGD);  Surgeon: Winfield Cunas., MD;  Location: [facility] ENDOSCOPY;  Service: Endoscopy;  Laterality: N/A;       Home Medications    Prior to Admission medications   Medication Sig Start Date End Date Taking?  Authorizing Provider  amLODipine (NORVASC) 5 MG tablet Take 5 mg by mouth daily.   Yes Historical Provider, MD  aspirin EC 81 MG tablet Take 81 mg by mouth daily.   Yes Historical Provider, MD  atorvastatin (LIPITOR) 20 MG tablet Take 20 mg by mouth daily. 01/27/15  Yes Historical Provider, MD    Family History Family History  Problem Relation Age of Onset  . Hypertension Mother     Social History Social History  Substance Use Topics  . Smoking status: Never Smoker  . Smokeless tobacco: Never Used  . Alcohol use No     Allergies   Sulfa antibiotics and Ramipril   Review of Systems Review of Systems  Constitutional: Positive for diaphoresis and fatigue. Negative for chills and fever.  HENT: Negative for congestion and rhinorrhea.   Eyes: Negative for visual disturbance.  Respiratory: Positive for chest tightness and shortness of breath. Negative for cough and wheezing.   Cardiovascular: Positive for chest pain. Negative for leg swelling.  Gastrointestinal: Negative for abdominal pain, diarrhea, nausea and vomiting.  Genitourinary: Negative for dysuria and flank pain.  Musculoskeletal: Negative for neck pain and neck stiffness.  Skin: Negative for rash and wound.  Allergic/Immunologic: Negative for immunocompromised state.  Neurological: Negative for syncope, weakness and headaches.     Physical Exam Updated Vital Signs BP 123/80 (BP Location: Right Arm)   Pulse 84   Temp 98.5 F (36.9 C) (Oral)   Resp (!) 24   Ht 5\' 11"  (1.803  m)   Wt 173 lb 1 oz (78.5 kg)   SpO2 97%   BMI 24.14 kg/m   Physical Exam  Constitutional: He is oriented to person, place, and time. He appears well-developed and well-nourished. He appears distressed (appears uncomfortable).  HENT:  Head: Normocephalic and atraumatic.  Mouth/Throat: Oropharynx is clear and moist.  Eyes: Conjunctivae are normal. Pupils are equal, round, and reactive to light.  Neck: Neck supple.  Cardiovascular:  Normal rate, regular rhythm and normal heart sounds.  Exam reveals no friction rub.   No murmur heard. Pulses 2+ and symmetric b/l UE and LE  Pulmonary/Chest: Effort normal and breath sounds normal. No respiratory distress. He has no wheezes. He has no rales.  Abdominal: He exhibits no distension. There is no tenderness.  Musculoskeletal: He exhibits no edema.  Neurological: He is alert and oriented to person, place, and time. He exhibits normal muscle tone.  Skin: Skin is warm. Capillary refill takes less than 2 seconds.  Nursing note and vitals reviewed.    ED Treatments / Results  Labs (all labs ordered are listed, but only abnormal results are displayed) Labs Reviewed  BASIC METABOLIC PANEL - Abnormal; Notable for the following:       Result Value   Glucose, Bld 111 (*)    All other components within normal limits  CBC - Abnormal; Notable for the following:    WBC 11.7 (*)    Platelets 122 (*)    All other components within normal limits  CBC - Abnormal; Notable for the following:    WBC 10.7 (*)    Platelets 149 (*)    All other components within normal limits  DIFFERENTIAL - Abnormal; Notable for the following:    Neutro Abs 8.7 (*)    All other components within normal limits  COMPREHENSIVE METABOLIC PANEL - Abnormal; Notable for the following:    Sodium 134 (*)    Chloride 98 (*)    Glucose, Bld 186 (*)    AST 62 (*)    Total Bilirubin 3.7 (*)    All other components within normal limits  TROPONIN I - Abnormal; Notable for the following:    Troponin I 5.31 (*)    All other components within normal limits  CBC - Abnormal; Notable for the following:    WBC 10.9 (*)    Platelets 138 (*)    All other components within normal limits  POCT I-STAT TROPONIN I - Abnormal; Notable for the following:    Troponin i, poc 3.80 (*)    All other components within normal limits  POCT I-STAT, CHEM 8 - Abnormal; Notable for the following:    Potassium 3.3 (*)    Chloride 97 (*)     Glucose, Bld 179 (*)    Calcium, Ion 1.28 (*)    Hemoglobin 17.3 (*)    All other components within normal limits  MRSA PCR SCREENING  PROTIME-INR  APTT  LIPID PANEL  CREATININE, SERUM  HEMOGLOBIN A1C  CBC  BASIC METABOLIC PANEL  I-STAT TROPOININ, ED  POCT ACTIVATED CLOTTING TIME  POCT ACTIVATED CLOTTING TIME    EKG  EKG Interpretation  Date/Time:  Friday March 26 2016 08:20:38 EDT Ventricular Rate:  96 PR Interval:    QRS Duration: 146 QT Interval:  391 QTC Calculation: 495 R Axis:   -113 Text Interpretation:  Sinus rhythm Paired ventricular premature complexes ST elevation now present in V2, V3 with deep TWI CODE STEMI activated No recipirocal changes QRS  widened Confirmed by Ellender Hose MD, Samyiah Halvorsen [phone]) on 03/26/2016 6:16:53 PM       Radiology Dg Chest Port 1 View  Result Date: 03/26/2016 CLINICAL DATA:  Chest pain EXAM: PORTABLE CHEST 1 VIEW COMPARISON:  07/09/2015 FINDINGS: Cardiac shadow remains enlarged. Mild aortic calcifications are again noted. The lungs are clear bilaterally. No bony abnormality is seen. IMPRESSION: No active disease. Electronically Signed   By: Inez Catalina M.D.   On: 03/26/2016 10:49    Procedures .Critical Care Performed by: Duffy Bruce Authorized by: Duffy Bruce   Critical care provider statement:    Critical care time (minutes):  31   Critical care time was exclusive of:  Separately billable procedures and treating other patients   Critical care was necessary to treat or prevent imminent or life-threatening deterioration of the following conditions:  Cardiac failure and circulatory failure   Critical care was time spent personally by me on the following activities:  Ordering and performing treatments and interventions, ordering and review of laboratory studies, ordering and review of radiographic studies, pulse oximetry, review of old charts, development of treatment plan with patient or surrogate, discussions with consultants,  obtaining history from patient or surrogate and re-evaluation of patient's condition   I assumed direction of critical care for this patient from another provider in my specialty: no      (including critical care time)  Medications Ordered in ED Medications  atorvastatin (LIPITOR) tablet 80 mg (80 mg Oral Given 03/26/16 1722)  sodium chloride flush (NS) 0.9 % injection 3 mL (not administered)  sodium chloride flush (NS) 0.9 % injection 3 mL (not administered)  0.9 %  sodium chloride infusion (not administered)  acetaminophen (TYLENOL) tablet 650 mg (not administered)  ondansetron (ZOFRAN) injection 4 mg (not administered)  enoxaparin (LOVENOX) injection 40 mg (not administered)  0.9 %  sodium chloride infusion ( Intravenous New Bag/Given 03/26/16 1059)  morphine 2 MG/ML injection 2 mg (2 mg Intravenous Given 03/26/16 1745)  aspirin chewable tablet 81 mg (not administered)  nitroGLYCERIN 50 mg in dextrose 5 % 250 mL (0.2 mg/mL) infusion (30 mcg/min Intravenous New Bag/Given 03/26/16 1058)  pneumococcal 23 valent vaccine (PNU-IMMUNE) injection 0.5 mL (not administered)  aspirin chewable tablet 324 mg (324 mg Oral Given 03/26/16 0834)  heparin injection 4,000 Units (4,000 Units Intravenous Given 03/26/16 0835)  PERFLUTREN LIPID MICROSPHERE injection SUSP (2 mLs  Given 03/26/16 1415)     Initial Impression / Assessment and Plan / ED Course  I have reviewed the triage vital signs and the nursing notes.  Pertinent labs & imaging results that were available during my care of the patient were reviewed by me and considered in my medical decision making (see chart for details).  Clinical Course   82 year old male with past medical history as above who presents with dull, substernal chest pressure since yesterday. Patient initially arrived to triage via private vehicle. I was notified by triage of EKG. On my assessment, patient has new right bundle-branch block as well as ST elevations in V2 and V3.  He has no obvious reciprocal changes. I immediately brought the patient back to the trauma bay. Given his persistent pain and concerning history with new changes on EKG, code STEMI was subsequently activated. I discussed the case with Dr. [name], who is in agreement. Patient given ASA, heparin bolus, and taken immediately to the cath lab. Will be admitted to Cardiology thereafter. Patient aware and in agreement. He remained HDS and in NAD in ED.  Final  Clinical Impressions(s) / ED Diagnoses   Final diagnoses:  Chest pain  ST elevation myocardial infarction (STEMI), unspecified artery [facility])    New Prescriptions Current Discharge Medication List       Duffy Bruce, MD 03/26/16 [phone]

## 2016-03-26 NOTE — ED Notes (Signed)
Pt to cath lab.

## 2016-03-26 NOTE — ED Notes (Signed)
Belongings bagged: wallet, hat, clothing and shoes.

## 2016-03-26 NOTE — Progress Notes (Signed)
Order for sheath removal verified per post procedural orders. Procedure explained to patient and RIGHT FEMORAL artery access site assessed: level 0, palpable dorsalis pedis and posterior tibial pulses. 6 French Sheath removed and manual pressure applied for 25 minutes. Pre, peri, & post procedural vitals: HR 80, RR 16, O2 Sat 99%, BP 120/70, Pain level 0. Distal pulses remained intact after sheath removal. Access site level 0 and dressed with 4X4 gauze and tegaderm.  Allayne Gitelman, RN confirmed condition of site. Post procedural instructions discussed with return demonstration from patient.

## 2016-03-27 ENCOUNTER — Inpatient Hospital Stay (HOSPITAL_COMMUNITY): Payer: Medicare Other

## 2016-03-27 ENCOUNTER — Encounter (HOSPITAL_COMMUNITY): Payer: Self-pay | Admitting: *Deleted

## 2016-03-27 DIAGNOSIS — I214 Non-ST elevation (NSTEMI) myocardial infarction: Principal | ICD-10-CM

## 2016-03-27 DIAGNOSIS — Z8673 Personal history of transient ischemic attack (TIA), and cerebral infarction without residual deficits: Secondary | ICD-10-CM

## 2016-03-27 LAB — BASIC METABOLIC PANEL
ANION GAP: 8 (ref 5–15)
BUN: 11 mg/dL (ref 6–20)
CHLORIDE: 103 mmol/L (ref 101–111)
CO2: 23 mmol/L (ref 22–32)
CREATININE: 0.85 mg/dL (ref 0.61–1.24)
Calcium: 8.5 mg/dL — ABNORMAL LOW (ref 8.9–10.3)
GFR calc non Af Amer: >60 mL/min (ref >60)
GLUCOSE: 139 mg/dL — AB (ref 65–99)
Potassium: 4 mmol/L (ref 3.5–5.1)
Sodium: 134 mmol/L — ABNORMAL LOW (ref 135–145)

## 2016-03-27 LAB — CBC
HEMATOCRIT: 38.7 % — AB (ref 39.0–52.0)
HEMOGLOBIN: 13.2 g/dL (ref 13.0–17.0)
MCH: 30 pg (ref 26.0–34.0)
MCHC: 34.1 g/dL (ref 30.0–36.0)
MCV: 88 fL (ref 78.0–100.0)
Platelets: 114 10*3/uL — ABNORMAL LOW (ref 150–400)
RBC: 4.4 MIL/uL (ref 4.22–5.81)
RDW: 15 % (ref 11.5–15.5)
WBC: 12.6 10*3/uL — ABNORMAL HIGH (ref 4.0–10.5)

## 2016-03-27 LAB — HEMOGLOBIN A1C
HEMOGLOBIN A1C: 5.6 % (ref 4.8–5.6)
Mean Plasma Glucose: 114 mg/dL

## 2016-03-27 MED ORDER — METOPROLOL TARTRATE 12.5 MG HALF TABLET: 12.5000 mg | ORAL_TABLET | Freq: Two times a day (BID) | ORAL | Status: DC

## 2016-03-27 MED ORDER — METOPROLOL TARTRATE 25 MG PO TABS
25.0000 mg | ORAL_TABLET | Freq: Once | ORAL | Status: AC
  Administered 2016-03-27: 25 mg via ORAL

## 2016-03-27 MED ORDER — TRAMADOL HCL 50 MG PO TABS
50.0000 mg | ORAL_TABLET | Freq: Two times a day (BID) | ORAL | Status: DC
  Administered 2016-03-27 – 2016-04-01 (×10): 50 mg via ORAL
  Filled 2016-03-27 (×11): qty 1

## 2016-03-27 MED ORDER — METOPROLOL TARTRATE 25 MG PO TABS
25.0000 mg | ORAL_TABLET | Freq: Two times a day (BID) | ORAL | Status: DC
  Administered 2016-03-27 – 2016-03-28 (×3): 25 mg via ORAL
  Filled 2016-03-27 (×4): qty 1

## 2016-03-27 MED ORDER — BRIMONIDINE TARTRATE 0.2 % OP SOLN
1.0000 [drp] | Freq: Three times a day (TID) | OPHTHALMIC | Status: DC
  Administered 2016-03-27 – 2016-03-29 (×4): 1 [drp] via OPHTHALMIC
  Filled 2016-03-27: qty 5

## 2016-03-27 MED ORDER — IOPAMIDOL (ISOVUE-370) INJECTION 76%
INTRAVENOUS | Status: AC
  Administered 2016-03-27: 100 mL
  Filled 2016-03-27: qty 100

## 2016-03-27 NOTE — Progress Notes (Signed)
Patient having frequent episodes of tachy into 140's-150's, asymptomatic in bed resting. Paged Murray Hodgkins Np. Notified of tachy. Metoprolol 12.5mg  was not administered at time, order changed to 25mg  Metoprolol. Metoprolol 25mg  given, patient resting in bed on room air, 68mcg Nitro infusing. Still c/o mild chest pain (unable to give number on pain scale) Heart rate in high 90's.  Harlo Jaso Murlean Iba RN

## 2016-03-27 NOTE — Progress Notes (Signed)
PATIENT ID: Mr. Leibensperger is an 36M with hypertension, hyperlipidemia, CVA and prior GI bleed here with EKG changes concerning for STEMI.  Found to have 50% LM and moderate-severe 2 vessel CAD on cath.  SUBJECTIVE:  Complains of pleuritic chest pain and shortness of breath. He also notes poor appetite and intermittent swelling in bilateral legs.     PHYSICAL EXAM Vitals:   03/27/16 0500 03/27/16 0600 03/27/16 0700 03/27/16 0800  BP: 124/76 107/75 112/79 119/74  Pulse: 97 97 (!) 101 95  Resp: (!) 30 (!) 28 (!) 24 (!) 27  Temp:   99.2 F (37.3 C)   TempSrc:   Oral   SpO2: 96% 96% 95% 96%  Weight:      Height:       General:  Well-appearing.  Neck: JVP to ear.   Lungs:  CTAB.  No crackles, rhonchi or wheezes Heart:  Tachycardic.  Regular rhythm.  II/VI systolic murmur at LUSB.  No r/g.  Normal S1/S2.  Abdomen:  Soft, NT, ND.  +BS  Extremities:  WWP.  No edema.  No calf pain.  LABS: Lab Results  Component Value Date   TROPONINI 5.31 ([place]) 03/26/2016   Results for orders placed or performed during the hospital encounter of 03/26/16 (from the past 24 hour(s))  I-STAT, chem 8     Status: Abnormal   Collection Time: 03/26/16  8:54 AM  Result Value Ref Range   Sodium 141 135 - 145 mmol/L   Potassium 3.3 (L) 3.5 - 5.1 mmol/L   Chloride 97 (L) 101 - 111 mmol/L   BUN 9 6 - 20 mg/dL   Creatinine, Ser 0.70 0.61 - 1.24 mg/dL   Glucose, Bld 179 (H) 65 - 99 mg/dL   Calcium, Ion 1.28 (H) 1.12 - 1.23 mmol/L   TCO2 24 0 - 100 mmol/L   Hemoglobin 17.3 (H) 13.0 - 17.0 g/dL   HCT 51.0 39.0 - 52.0 %  POCT Activated clotting time     Status: None   Collection Time: 03/26/16  9:05 AM  Result Value Ref Range   Activated Clotting Time 230 seconds  POCT Activated clotting time     Status: None   Collection Time: 03/26/16 10:58 AM  Result Value Ref Range   Activated Clotting Time 147 seconds  Hemoglobin A1c     Status: None   Collection Time: 03/26/16 11:00 AM  Result Value Ref Range   Hgb  A1c MFr Bld 5.6 4.8 - 5.6 %   Mean Plasma Glucose 114 mg/dL  CBC     Status: Abnormal   Collection Time: 03/26/16 11:00 AM  Result Value Ref Range   WBC 10.9 (H) 4.0 - 10.5 K/uL   RBC 4.93 4.22 - 5.81 MIL/uL   Hemoglobin 14.8 13.0 - 17.0 g/dL   HCT 43.5 39.0 - 52.0 %   MCV 88.2 78.0 - 100.0 fL   MCH 30.0 26.0 - 34.0 pg   MCHC 34.0 30.0 - 36.0 g/dL   RDW 15.1 11.5 - 15.5 %   Platelets 138 (L) 150 - 400 K/uL  Creatinine, serum     Status: None   Collection Time: 03/26/16 11:00 AM  Result Value Ref Range   Creatinine, Ser 0.68 0.61 - 1.24 mg/dL   GFR calc non Af Amer >60 >60 mL/min   GFR calc Af Amer >60 >60 mL/min  MRSA PCR Screening     Status: None   Collection Time: 03/26/16 11:16 AM  Result Value Ref Range  MRSA by PCR NEGATIVE NEGATIVE  Basic metabolic panel     Status: Abnormal   Collection Time: 03/26/16 12:00 PM  Result Value Ref Range   Sodium 136 135 - 145 mmol/L   Potassium 3.8 3.5 - 5.1 mmol/L   Chloride 102 101 - 111 mmol/L   CO2 25 22 - 32 mmol/L   Glucose, Bld 111 (H) 65 - 99 mg/dL   BUN 8 6 - 20 mg/dL   Creatinine, Ser 0.76 0.61 - 1.24 mg/dL   Calcium 9.0 8.9 - 10.3 mg/dL   GFR calc non Af Amer >60 >60 mL/min   GFR calc Af Amer >60 >60 mL/min   Anion gap 9 5 - 15  CBC     Status: Abnormal   Collection Time: 03/26/16 12:00 PM  Result Value Ref Range   WBC 11.7 (H) 4.0 - 10.5 K/uL   RBC 5.13 4.22 - 5.81 MIL/uL   Hemoglobin 15.1 13.0 - 17.0 g/dL   HCT 46.4 39.0 - 52.0 %   MCV 90.4 78.0 - 100.0 fL   MCH 29.4 26.0 - 34.0 pg   MCHC 32.5 30.0 - 36.0 g/dL   RDW 15.0 11.5 - 15.5 %   Platelets 122 (L) 150 - 400 K/uL  CBC     Status: Abnormal   Collection Time: 03/27/16  2:02 AM  Result Value Ref Range   WBC 12.6 (H) 4.0 - 10.5 K/uL   RBC 4.40 4.22 - 5.81 MIL/uL   Hemoglobin 13.2 13.0 - 17.0 g/dL   HCT 38.7 (L) 39.0 - 52.0 %   MCV 88.0 78.0 - 100.0 fL   MCH 30.0 26.0 - 34.0 pg   MCHC 34.1 30.0 - 36.0 g/dL   RDW 15.0 11.5 - 15.5 %   Platelets 114 (L)  150 - 400 K/uL  Basic metabolic panel     Status: Abnormal   Collection Time: 03/27/16  2:02 AM  Result Value Ref Range   Sodium 134 (L) 135 - 145 mmol/L   Potassium 4.0 3.5 - 5.1 mmol/L   Chloride 103 101 - 111 mmol/L   CO2 23 22 - 32 mmol/L   Glucose, Bld 139 (H) 65 - 99 mg/dL   BUN 11 6 - 20 mg/dL   Creatinine, Ser 0.85 0.61 - 1.24 mg/dL   Calcium 8.5 (L) 8.9 - 10.3 mg/dL   GFR calc non Af Amer >60 >60 mL/min   GFR calc Af Amer >60 >60 mL/min   Anion gap 8 5 - 15    Intake/Output Summary (Last 24 hours) at 03/27/16 0848 Last data filed at 03/27/16 0800  Gross per 24 hour  Intake          2025.45 ml  Output             1450 ml  Net           575.45 ml    Telemetry:  PVCs, NSVT up to 8 beats, PACs  LHC 03/26/16:  Ost LM to LM lesion, 50 %stenosed.  Ost Cx lesion, 75 %stenosed.  Mid Cx lesion, 30 %stenosed.  3rd Mrg lesion, 50 %stenosed.  Prox RCA lesion, 30 %stenosed.  Dist RCA lesion, 65 %stenosed.  RPDA lesion, 50 %stenosed.  The left ventricular systolic function is normal.  LV end diastolic pressure is normal.  The left ventricular ejection fraction is greater than 65% by visual estimate.   Hyperdynamic LV function without focal segmental wall motion abnormalities.  Coronary calcification with 50% distal left  main stenosis, a normal LAD, 75% ostial circumflex stenosis with 30% mid circumflex and 50% distal marginal stenosis and 30% proximal RCA stenosis with 60-70% distal stenosis proximal to the PDA takeoff and 50% distal PDA stenosis.  Echo 03/26/16: Study Conclusions  - Left ventricle: The cavity size was normal. Wall thickness was   increased in a pattern of severe LVH. Systolic function was   normal. The estimated ejection fraction was in the range of 55%   to 60%. Wall motion was normal; there were no regional wall   motion abnormalities. Doppler parameters are consistent with   abnormal left ventricular relaxation (grade 1 diastolic    dysfunction). Doppler parameters are consistent with high   ventricular filling pressure. - Left atrium: The atrium was mildly dilated. - Right atrium: The atrium was mildly dilated. - Pericardium, extracardiac: A small pericardial effusion was   identified.  Impressions:  - Normal LV systolic function; grade 1 diastolic dysfunction with   elevated LV filling pressure; severe LVH; mild biatrial   enlargement; small pericardial effusion; myocardium has speckled   appearance concerning for amyloid; suggest further evaluation.   ASSESSMENT AND PLAN:  Principal Problem:   STEMI (ST elevation myocardial infarction) ([place]) Active Problems:   Hypertension   Hyperlipidemia   H/O: CVA (cerebrovascular accident)   ACS (abdominal compartment syndrome)   Acute coronary syndrome [facility])   Chest pain   # NSTEMI: Mr. Worrell was noted to have a 50% distal LM lesion, 75% LCx and 65% RCA.  He also had mild disease in OM3 and the PDA.  Troponin 5.31.  The plan is to try aggressive medical management and consider CABG if this fails.   Continue aspirin and atorvastatin.  He has not been started on a beta blocker due to low BP.  There is concern for PE, so we will get a chest CT prior to starting this.  # Severe LVH concerning for amyloidosis:  We will check SPEP/UPEP as the first step in evaluation for amyloidosis.  We will also get a cardiac MRI, but his heart rate is too high.  If CT is negative for PE we will start metoprolol.   # Sinus tachycardia: Mr. Kan continues to have episodes of sinus tachycardia with rates up to the 140s.  He also complains of pleuritic chest pain and is requiring supplemental oxygen.  We will obtain a CT-A to rule out PE.      Shacarra Choe C. Oval Linsey, MD, [facility] 03/27/2016 8:48 AM

## 2016-03-28 DIAGNOSIS — I2121 ST elevation (STEMI) myocardial infarction involving left circumflex coronary artery: Secondary | ICD-10-CM

## 2016-03-28 DIAGNOSIS — I214 Non-ST elevation (NSTEMI) myocardial infarction: Secondary | ICD-10-CM | POA: Diagnosis not present

## 2016-03-28 LAB — BASIC METABOLIC PANEL
Anion gap: 9 (ref 5–15)
BUN: 13 mg/dL (ref 6–20)
CALCIUM: 8.7 mg/dL — AB (ref 8.9–10.3)
CHLORIDE: 101 mmol/L (ref 101–111)
CO2: 25 mmol/L (ref 22–32)
CREATININE: 0.91 mg/dL (ref 0.61–1.24)
GFR calc non Af Amer: >60 mL/min (ref >60)
GLUCOSE: 91 mg/dL (ref 65–99)
Potassium: 4.6 mmol/L (ref 3.5–5.1)
Sodium: 135 mmol/L (ref 135–145)

## 2016-03-28 LAB — CBC
HEMATOCRIT: 39.8 % (ref 39.0–52.0)
HEMOGLOBIN: 12.8 g/dL — AB (ref 13.0–17.0)
MCH: 29 pg (ref 26.0–34.0)
MCHC: 32.2 g/dL (ref 30.0–36.0)
MCV: 90.2 fL (ref 78.0–100.0)
Platelets: 100 10*3/uL — ABNORMAL LOW (ref 150–400)
RBC: 4.41 MIL/uL (ref 4.22–5.81)
RDW: 15 % (ref 11.5–15.5)
WBC: 11.3 10*3/uL — ABNORMAL HIGH (ref 4.0–10.5)

## 2016-03-28 NOTE — Progress Notes (Signed)
PATIENT ID: Gregory [name] is an 53M with hypertension, hyperlipidemia, CVA and prior GI bleed here with EKG changes concerning for STEMI.  Found to have 50% LM and moderate-severe 2 vessel CAD on cath.  SUBJECTIVE:  Chest pain and shortness of breath have improved.  He has no complaints this AM.    PHYSICAL EXAM Vitals:   03/28/16 0300 03/28/16 0400 03/28/16 0500 03/28/16 0700  BP: 114/80 131/62 (!) 111/98 133/64  Pulse:  73 81 78  Resp:      Temp:  98.4 F (36.9 C)  98.1 F (36.7 C)  TempSrc:  Oral  Oral  SpO2:  99% 99% 98%  Weight:      Height:       General:  Well-appearing.  Neck: JVP to ear.   Lungs:  CTAB.  No crackles, rhonchi or wheezes Heart: RRR.  Regular rhythm.  II/VI systolic murmur at LUSB.  No r/g.  Normal S1/S2.  Abdomen:  Soft, NT, ND.  +BS  Extremities:  WWP.  No edema.  No calf pain.  LABS: Lab Results  Component Value Date   TROPONINI 5.31 ([place]) 03/26/2016   Results for orders placed or performed during the hospital encounter of 03/26/16 (from the past 24 hour(s))  CBC     Status: Abnormal   Collection Time: 03/28/16  2:17 AM  Result Value Ref Range   WBC 11.3 (H) 4.0 - 10.5 K/uL   RBC 4.41 4.22 - 5.81 MIL/uL   Hemoglobin 12.8 (L) 13.0 - 17.0 g/dL   HCT 39.8 39.0 - 52.0 %   MCV 90.2 78.0 - 100.0 fL   MCH 29.0 26.0 - 34.0 pg   MCHC 32.2 30.0 - 36.0 g/dL   RDW 15.0 11.5 - 15.5 %   Platelets 100 (L) 150 - 400 K/uL  Basic metabolic panel     Status: Abnormal   Collection Time: 03/28/16  2:17 AM  Result Value Ref Range   Sodium 135 135 - 145 mmol/L   Potassium 4.6 3.5 - 5.1 mmol/L   Chloride 101 101 - 111 mmol/L   CO2 25 22 - 32 mmol/L   Glucose, Bld 91 65 - 99 mg/dL   BUN 13 6 - 20 mg/dL   Creatinine, Ser 0.91 0.61 - 1.24 mg/dL   Calcium 8.7 (L) 8.9 - 10.3 mg/dL   GFR calc non Af Amer >60 >60 mL/min   GFR calc Af Amer >60 >60 mL/min   Anion gap 9 5 - 15    Intake/Output Summary (Last 24 hours) at 03/28/16 0930 Last data filed at 03/28/16  0744  Gross per 24 hour  Intake           591.85 ml  Output              800 ml  Net          -208.15 ml    Telemetry:  PVCs, NSVT up to 8 beats, PACs  LHC 03/26/16:  Ost LM to LM lesion, 50 %stenosed.  Ost Cx lesion, 75 %stenosed.  Mid Cx lesion, 30 %stenosed.  3rd Mrg lesion, 50 %stenosed.  Prox RCA lesion, 30 %stenosed.  Dist RCA lesion, 65 %stenosed.  RPDA lesion, 50 %stenosed.  The left ventricular systolic function is normal.  LV end diastolic pressure is normal.  The left ventricular ejection fraction is greater than 65% by visual estimate.   Hyperdynamic LV function without focal segmental wall motion abnormalities.  Coronary calcification with 50% distal left main  stenosis, a normal LAD, 75% ostial circumflex stenosis with 30% mid circumflex and 50% distal marginal stenosis and 30% proximal RCA stenosis with 60-70% distal stenosis proximal to the PDA takeoff and 50% distal PDA stenosis.  Echo 03/26/16: Study Conclusions  - Left ventricle: The cavity size was normal. Wall thickness was   increased in a pattern of severe LVH. Systolic function was   normal. The estimated ejection fraction was in the range of 55%   to 60%. Wall motion was normal; there were no regional wall   motion abnormalities. Doppler parameters are consistent with   abnormal left ventricular relaxation (grade 1 diastolic   dysfunction). Doppler parameters are consistent with high   ventricular filling pressure. - Left atrium: The atrium was mildly dilated. - Right atrium: The atrium was mildly dilated. - Pericardium, extracardiac: A small pericardial effusion was   identified.  Impressions:  - Normal LV systolic function; grade 1 diastolic dysfunction with   elevated LV filling pressure; severe LVH; mild biatrial   enlargement; small pericardial effusion; myocardium has speckled   appearance concerning for amyloid; suggest further evaluation.   ASSESSMENT AND PLAN:  Principal  Problem:   STEMI (ST elevation myocardial infarction) ([place]) Active Problems:   Hypertension   Hyperlipidemia   H/O: CVA (cerebrovascular accident)   ACS (abdominal compartment syndrome)   Acute coronary syndrome [facility])   Chest pain   # NSTEMI: Mr. Reddish was noted to have a 50% distal LM lesion, 75% LCx and 65% RCA.  He also had mild disease in OM3 and the PDA.  Troponin 5.31.  The plan is to try aggressive medical management and consider CABG if this fails.   Continue aspirin and atorvastatin.  Metoprolol was started 8/12.  If he is not going to go for CABG, he needs to start P2Y12 prior to discharge.  He will ambulate today to see if his chest pain recurs.  # Severe LVH concerning for amyloidosis:  SPEP/UPEP pending.  We will get a cardiac MRI today.    # Sinus tachycardia: Episodes of sinus tachycardia have improved since starting metoprolol.  Continue metoprolol tartrate 25mg  bid. CT-A was negative for PE.      Tuana Hoheisel C. Oval Linsey, MD, [facility] 03/28/2016 9:30 AM

## 2016-03-29 ENCOUNTER — Encounter (HOSPITAL_COMMUNITY): Payer: Self-pay | Admitting: Cardiovascular Disease

## 2016-03-29 DIAGNOSIS — E785 Hyperlipidemia, unspecified: Secondary | ICD-10-CM

## 2016-03-29 DIAGNOSIS — I480 Paroxysmal atrial fibrillation: Secondary | ICD-10-CM

## 2016-03-29 DIAGNOSIS — Z7901 Long term (current) use of anticoagulants: Secondary | ICD-10-CM

## 2016-03-29 DIAGNOSIS — Z5181 Encounter for therapeutic drug level monitoring: Secondary | ICD-10-CM

## 2016-03-29 DIAGNOSIS — I10 Essential (primary) hypertension: Secondary | ICD-10-CM

## 2016-03-29 LAB — BASIC METABOLIC PANEL
ANION GAP: 6 (ref 5–15)
BUN: 19 mg/dL (ref 6–20)
CALCIUM: 8.2 mg/dL — AB (ref 8.9–10.3)
CO2: 21 mmol/L — AB (ref 22–32)
CREATININE: 0.93 mg/dL (ref 0.61–1.24)
Chloride: 102 mmol/L (ref 101–111)
Glucose, Bld: 102 mg/dL — ABNORMAL HIGH (ref 65–99)
Potassium: 3.9 mmol/L (ref 3.5–5.1)
SODIUM: 129 mmol/L — AB (ref 135–145)

## 2016-03-29 MED ORDER — METOPROLOL TARTRATE 50 MG PO TABS
50.0000 mg | ORAL_TABLET | Freq: Two times a day (BID) | ORAL | Status: DC
  Administered 2016-03-29 – 2016-04-01 (×7): 50 mg via ORAL
  Filled 2016-03-29 (×7): qty 1

## 2016-03-29 MED ORDER — METOPROLOL TARTRATE 5 MG/5ML IV SOLN: 2.5000 mg | INTRAVENOUS | Status: DC | PRN

## 2016-03-29 MED ORDER — ENOXAPARIN SODIUM 80 MG/0.8ML ~~LOC~~ SOLN: 80.0000 mg | Freq: Two times a day (BID) | SUBCUTANEOUS | Status: DC

## 2016-03-29 MED ORDER — ENOXAPARIN SODIUM 80 MG/0.8ML ~~LOC~~ SOLN
80.0000 mg | Freq: Two times a day (BID) | SUBCUTANEOUS | Status: DC
  Administered 2016-03-29 – 2016-03-30 (×2): 80 mg via SUBCUTANEOUS
  Filled 2016-03-29 (×2): qty 0.8

## 2016-03-29 MED ORDER — METOPROLOL TARTRATE 5 MG/5ML IV SOLN
2.5000 mg | Freq: Once | INTRAVENOUS | Status: AC
  Administered 2016-03-29: 2.5 mg via INTRAVENOUS
  Filled 2016-03-29: qty 5

## 2016-03-29 MED ORDER — ISOSORBIDE MONONITRATE ER 30 MG PO TB24
30.0000 mg | ORAL_TABLET | Freq: Every day | ORAL | Status: DC
Start: 2016-03-29 — End: 2016-04-01
  Administered 2016-03-29 – 2016-04-01 (×4): 30 mg via ORAL
  Filled 2016-03-29 (×4): qty 1

## 2016-03-29 MED ORDER — BRIMONIDINE TARTRATE 0.2 % OP SOLN
1.0000 [drp] | Freq: Every day | OPHTHALMIC | Status: DC
  Administered 2016-03-30 – 2016-03-31 (×2): 1 [drp] via OPHTHALMIC
  Filled 2016-03-29 (×2): qty 5

## 2016-03-29 MED FILL — Lidocaine HCl Local Preservative Free (PF) Inj 1%: INTRAMUSCULAR | Qty: 30 | Status: AC

## 2016-03-29 MED FILL — Potassium Chloride Inj 10 mEq/100ML: INTRAVENOUS | Qty: 100 | Status: AC

## 2016-03-29 NOTE — Progress Notes (Signed)
CARDIAC REHAB PHASE I   PRE:  Rate/Rhythm: 107 afib    BP: sitting 113/86    SaO2: 98 RA  MODE:  Ambulation: 250 ft   POST:  Rate/Rhythm: 130 afib    BP: sitting 140/82     SaO2: 97 RA  Pt able to stand and walk with min assist (light hold on gait belt) at his request. Legs slightly weak per his report. HR mostly in 110s walking however did hit 130 at end of walk. Pt without major c/o. Return to recliner. Will f/u. [id number]   [place], ACSM 03/29/2016 11:34 AM

## 2016-03-29 NOTE — Progress Notes (Signed)
Subjective:  No chest pain but notices HR increasing.  Objective:   Vital Signs : Vitals:   03/29/16 0400 03/29/16 0500 03/29/16 0600 03/29/16 0800  BP: (!) 87/60 (!) 132/58 108/72   Pulse: 67 69 70   Resp:      Temp: 98.7 F (37.1 C)   98 F (36.7 C)  TempSrc: Oral   Oral  SpO2: 96% 97% 96%   Weight:      Height:        Intake/Output from previous day:  Intake/Output Summary (Last 24 hours) at 03/29/16 0853 Last data filed at 03/29/16 0800  Gross per 24 hour  Intake              600 ml  Output              650 ml  Net              -50 ml    I/O since admission: +352  Wt Readings from Last 3 Encounters:  03/26/16 173 lb 1 oz (78.5 kg)  02/15/15 180 lb (81.6 kg)  10/03/14 176 lb 9.4 oz (80.1 kg)    Medications: . aspirin  81 mg Oral Daily  . atorvastatin  80 mg Oral q1800  . brimonidine  1 drop Both Eyes TID  . enoxaparin (LOVENOX) injection  40 mg Subcutaneous Q24H  . metoprolol tartrate  25 mg Oral BID  . sodium chloride flush  3 mL Intravenous Q12H  . traMADol  50 mg Oral Q12H       Physical Exam:   General appearance: alert, cooperative and no distress Neck: no adenopathy, no JVD, supple, symmetrical, trachea midline and thyroid not enlarged, symmetric, no tenderness/mass/nodules Lungs: no wheezing Heart: irregularly irregular rhythm and tachycardic at 120; 1/6 sem, Abdomen: soft, non-tender; bowel sounds normal; no masses,  no organomegaly Extremities: no edema, redness or tenderness in the calves or thighs Neurologic: Grossly normal   Rate: 110-120  Rhythm: atrial fibrillation  ECG (independently read by me): NSR at 99; RBBB  Will repeat now since in AF with RVR  Lab Results:   Recent Labs  03/27/16 0202 03/28/16 0217 03/29/16 0128  NA 134* 135 129*  K 4.0 4.6 3.9  CL 103 101 102  CO2 23 25 21*  GLUCOSE 139* 91 102*  BUN _0 CREATININE 0.85 0.91 0.93  CALCIUM 8.5* 8.7* 8.2*    Hepatic Function Latest Ref Rng & Units  03/26/2016 10/02/2014 04/20/2014  Total Protein 6.5 - 8.1 g/dL 6.8 6.3 7.4  Albumin 3.5 - 5.0 g/dL 4.0 3.9 3.8  AST 15 - 41 U/L 62(H) 33 26  ALT 17 - 63 U/L _1 Alk Phosphatase 38 - 126 U/L 84 69 91  Total Bilirubin 0.3 - 1.2 mg/dL 3.7(H) 1.5(H) 1.4(H)     Recent Labs  03/26/16 1200 03/27/16 0202 03/28/16 0217  WBC 11.7* 12.6* 11.3*  HGB 15.1 13.2 12.8*  HCT 46.4 38.7* 39.8  MCV 90.4 88.0 90.2  PLT 122* 114* 100*    No results for input(s): TROPONINI in the last 72 hours.  Invalid input(s): CK, MB    Recent Labs  03/26/16 1100  HGBA1C 5.6    No results for input(s): PROT, ALBUMIN, AST, ALT, ALKPHOS, BILITOT, BILIDIR, IBILI in the last 72 hours. No results for input(s): INR in the last 72 hours. BNP (last 3 results) No results for input(s): BNP in the last 8760 hours.  ProBNP (last  3 results) No results for input(s): PROBNP in the last 8760 hours.   Lipid Panel     Component Value Date/Time   CHOL 140 03/26/2016 0815   TRIG 39 03/26/2016 0815   HDL 67 03/26/2016 0815   CHOLHDL 2.1 03/26/2016 0815   VLDL 8 03/26/2016 0815   LDLCALC 65 03/26/2016 0815      Imaging:  Ct Angio Chest Pe W Or Wo Contrast  Result Date: 03/27/2016 CLINICAL DATA:  Patient with persistent chest pain. EXAM: CT ANGIOGRAPHY CHEST WITH CONTRAST TECHNIQUE: Multidetector CT imaging of the chest was performed using the standard protocol during bolus administration of intravenous contrast. Multiplanar CT image reconstructions and MIPs were obtained to evaluate the vascular anatomy. CONTRAST:  100 cc Isovue 370 COMPARISON:  Chest radiograph 03/26/2016 FINDINGS: Cardiovascular: Heart is enlarged. Small pericardial effusion. Aorta and main pulmonary artery normal in caliber. Aortic and coronary arterial vascular calcifications. Adequate opacification of the pulmonary arterial system. Motion artifact limits evaluation. No evidence for pulmonary embolism. Mediastinum/Nodes: No axillary,  mediastinal or hilar lymphadenopathy. Esophagus is unremarkable. Lungs/Pleura: Central airways are patent. Dependent ground-glass and consolidative pulmonary opacities within the right and left lower lobes. Trace pleural effusions bilaterally. No pneumothorax. Upper Abdomen: Cholelithiasis. Musculoskeletal: Aggressive or acute appearing osseous lesions. Review of the MIP images confirms the above findings. IMPRESSION: No evidence for pulmonary embolism. Cardiomegaly.  Small pericardial effusion. Bilateral lower lobe subpleural consolidative opacities favored to represent atelectasis. Small bilateral pleural effusions. Aortic vascular calcifications. Electronically Signed   By: Lovey Newcomer M.D.   On: 03/27/2016 11:37   LHC 03/26/16:  Ost LM to LM lesion, 50 %stenosed.  Ost Cx lesion, 75 %stenosed.  Mid Cx lesion, 30 %stenosed.  3rd Mrg lesion, 50 %stenosed.  Prox RCA lesion, 30 %stenosed.  Dist RCA lesion, 65 %stenosed.  RPDA lesion, 50 %stenosed.  The left ventricular systolic function is normal.  LV end diastolic pressure is normal.  The left ventricular ejection fraction is greater than 65% by visual estimate.  Hyperdynamic LV function without focal segmental wall motion abnormalities.  Coronary calcification with 50% distal left main stenosis, a normal LAD, 75% ostial circumflex stenosis with 30% mid circumflex and 50% distal marginal stenosis and 30% proximal RCA stenosis with 60-70% distal stenosis proximal to the PDA takeoff and 50% distal PDA stenosis.  Echo 03/26/16: Study Conclusions  - Left ventricle: The cavity size was normal. Wall thickness was increased in a pattern of severe LVH. Systolic function was normal. The estimated ejection fraction was in the range of 55% to 60%. Wall motion was normal; there were no regional wall motion abnormalities. Doppler parameters are consistent with abnormal left ventricular relaxation (grade 1 diastolic dysfunction).  Doppler parameters are consistent with high ventricular filling pressure. - Left atrium: The atrium was mildly dilated. - Right atrium: The atrium was mildly dilated. - Pericardium, extracardiac: A small pericardial effusion was identified.    Impressions:  - Normal LV systolic function; grade 1 diastolic dysfunction with elevated LV filling pressure; severe LVH; mild biatrial enlargement; small pericardial effusion; myocardium has speckled appearance concerning for amyloid; suggest further evaluation.    Assessment/Plan:   Principal Problem:   STEMI (ST elevation myocardial infarction) ([place]) Active Problems:   Hypertension   Hyperlipidemia   H/O: CVA (cerebrovascular accident)   ACS (abdominal compartment syndrome)   Acute coronary syndrome ([facility])   Chest pain   1. CAD/Day 3 s/p NTEMI:  Cath findings as above. Initial plan is for medical therapy. Currently on  metoprolol.  Will add imdur 30 mg daly.   2. AF with RVR:  Recurrent this am,  Will change lovenox from DVT dosing to full dose anticoagulation dosing.  Will give lopressor 2.5 mg iv now and PRN HR>120 and titrate oral lopressor to 50 mg bid.  [id number] score is at least 6 and will need long term anticoagulation.   3. Severe LVH with concern for amyloid;  Plan for cardiac MRI as already scheduled but defer today with AF RVR.  4. Hyperlipidemia:  On atorvastatin 80 mg daily; LDL 65  5. H/o CVA  6. Hyponatrmia: Na 129 today; will check TSH; may need to fluid restrict; f/u lab is am   Troy Sine, MD, [facility] 03/29/2016, 8:53 AM

## 2016-03-30 ENCOUNTER — Inpatient Hospital Stay (HOSPITAL_COMMUNITY): Payer: Medicare Other

## 2016-03-30 DIAGNOSIS — I517 Cardiomegaly: Secondary | ICD-10-CM

## 2016-03-30 LAB — CBC
HCT: 38.9 % — ABNORMAL LOW (ref 39.0–52.0)
Hemoglobin: 13.1 g/dL (ref 13.0–17.0)
MCH: 29.5 pg (ref 26.0–34.0)
MCHC: 33.7 g/dL (ref 30.0–36.0)
MCV: 87.6 fL (ref 78.0–100.0)
PLATELETS: 136 10*3/uL — AB (ref 150–400)
RBC: 4.44 MIL/uL (ref 4.22–5.81)
RDW: 14.7 % (ref 11.5–15.5)
WBC: 9.5 10*3/uL (ref 4.0–10.5)

## 2016-03-30 LAB — UIFE/LIGHT CHAINS/TP QN, 24-HR UR
% BETA, Urine: 20.3 %
ALBUMIN, U: 32.3 %
ALPHA 1 URINE: 3.7 %
ALPHA 2 UR: 26.9 %
FREE KAPPA/LAMBDA RATIO: 7.79 (ref 2.04–10.37)
FREE LT CHN EXCR RATE: 39.4 mg/L — AB (ref 1.35–24.19)
Free Lambda Lt Chains,Ur: 5.06 mg/L (ref 0.24–6.66)
GAMMA GLOBULIN URINE: 16.8 %
TIME-UPE24: 24 h
TOTAL PROTEIN, URINE-UPE24: 8.5 mg/dL
Total Protein, Urine-Ur/day: 179 mg/24 hr — ABNORMAL HIGH (ref 30–150)
VOLUME, URINE-UPE24: 2100 mL

## 2016-03-30 LAB — MAGNESIUM: MAGNESIUM: 2 mg/dL (ref 1.7–2.4)

## 2016-03-30 LAB — COMPREHENSIVE METABOLIC PANEL
ALK PHOS: 63 U/L (ref 38–126)
ALT: 31 U/L (ref 17–63)
AST: 42 U/L — ABNORMAL HIGH (ref 15–41)
Albumin: 2.7 g/dL — ABNORMAL LOW (ref 3.5–5.0)
Anion gap: 7 (ref 5–15)
BUN: 21 mg/dL — ABNORMAL HIGH (ref 6–20)
CALCIUM: 8.6 mg/dL — AB (ref 8.9–10.3)
CHLORIDE: 104 mmol/L (ref 101–111)
CO2: 24 mmol/L (ref 22–32)
CREATININE: 1.17 mg/dL (ref 0.61–1.24)
GFR, EST NON AFRICAN AMERICAN: 56 mL/min — AB (ref >60)
Glucose, Bld: 109 mg/dL — ABNORMAL HIGH (ref 65–99)
Potassium: 4.1 mmol/L (ref 3.5–5.1)
Sodium: 135 mmol/L (ref 135–145)
Total Bilirubin: 1.5 mg/dL — ABNORMAL HIGH (ref 0.3–1.2)
Total Protein: 5.8 g/dL — ABNORMAL LOW (ref 6.5–8.1)

## 2016-03-30 LAB — TSH: TSH: 3.848 u[IU]/mL (ref 0.350–4.500)

## 2016-03-30 MED ORDER — APIXABAN 5 MG PO TABS
5.0000 mg | ORAL_TABLET | Freq: Two times a day (BID) | ORAL | Status: DC
  Administered 2016-03-30 – 2016-04-01 (×4): 5 mg via ORAL
  Filled 2016-03-30 (×4): qty 1

## 2016-03-30 MED ORDER — GADOBENATE DIMEGLUMINE 529 MG/ML IV SOLN
25.0000 mL | Freq: Once | INTRAVENOUS | Status: AC | PRN
  Administered 2016-03-30: 25 mL via INTRAVENOUS

## 2016-03-30 NOTE — Discharge Instructions (Signed)

## 2016-03-30 NOTE — Progress Notes (Signed)
Report called to Rn on 2W. Patient with no complaints at the current time. Will transfer via wc.

## 2016-03-30 NOTE — Progress Notes (Signed)
Subjective:  No chest pain or dyspnea  Objective:   Vital Signs : Vitals:   03/29/16 2300 03/30/16 0323 03/30/16 0733 03/30/16 0750  BP: 127/77 102/73  122/77  Pulse: 89 63    Resp:    20  Temp: 98.6 F (37 C) 98.3 F (36.8 C) 98.1 F (36.7 C)   TempSrc: Tympanic Oral Oral   SpO2:  93%  96%  Weight:      Height:        Intake/Output from previous day:  Intake/Output Summary (Last 24 hours) at 03/30/16 0847 Last data filed at 03/30/16 0800  Gross per 24 hour  Intake              780 ml  Output                0 ml  Net              780 ml    I/O since admission: +352  Wt Readings from Last 3 Encounters:  03/26/16 173 lb 1 oz (78.5 kg)  02/15/15 180 lb (81.6 kg)  10/03/14 176 lb 9.4 oz (80.1 kg)    Medications: . aspirin  81 mg Oral Daily  . atorvastatin  80 mg Oral q1800  . brimonidine  1 drop Both Eyes Daily  . enoxaparin (LOVENOX) injection  80 mg Subcutaneous Q12H  . isosorbide mononitrate  30 mg Oral Daily  . metoprolol tartrate  50 mg Oral BID  . sodium chloride flush  3 mL Intravenous Q12H  . traMADol  50 mg Oral Q12H       Physical Exam:   General appearance: alert, cooperative and no distress Neck: no adenopathy, no JVD, supple, symmetrical, trachea midline and thyroid not enlarged, symmetric, no tenderness/mass/nodules Lungs: no wheezing Heart: irregularly irregular rhythm and tachycardic at 120; 1/6 sem, Abdomen: soft, non-tender; bowel sounds normal; no masses,  no organomegaly Extremities: no edema, redness or tenderness in the calves or thighs Neurologic: Grossly normal   Rate: 74  Rhythm: NSR  Will repeat today since converted to sinus rhythm.  ECG (independently read by me): AFlutter with variable block 100  ECG (independently read by me): NSR at 99; RBBB   Lab Results:   Recent Labs  03/28/16 0217 03/29/16 0128 03/30/16 0250  NA 135 129* 135  K 4.6 3.9 4.1  CL 101 102 104  CO2 25 21* 24  GLUCOSE 91 102* 109*  BUN  13 19 21*  CREATININE 0.91 0.93 1.17  CALCIUM 8.7* 8.2* 8.6*  MG  --   --  2.0    Hepatic Function Latest Ref Rng & Units 03/30/2016 03/26/2016 10/02/2014  Total Protein 6.5 - 8.1 g/dL 5.8(L) 6.8 6.3  Albumin 3.5 - 5.0 g/dL 2.7(L) 4.0 3.9  AST 15 - 41 U/L 42(H) 62(H) 33  ALT 17 - 63 U/L '31 30 15  ' Alk Phosphatase 38 - 126 U/L 63 84 69  Total Bilirubin 0.3 - 1.2 mg/dL 1.5(H) 3.7(H) 1.5(H)     Recent Labs  03/28/16 0217 03/30/16 0250  WBC 11.3* 9.5  HGB 12.8* 13.1  HCT 39.8 38.9*  MCV 90.2 87.6  PLT 100* 136*    No results for input(s): TROPONINI in the last 72 hours.  Invalid input(s): CK, MB   No results for input(s): HGBA1C in the last 72 hours.   Recent Labs  03/30/16 0250  PROT 5.8*  ALBUMIN 2.7*  AST 42*  ALT 31  ALKPHOS 63  BILITOT 1.5*   No results for input(s): INR in the last 72 hours. BNP (last 3 results) No results for input(s): BNP in the last 8760 hours.  ProBNP (last 3 results) No results for input(s): PROBNP in the last 8760 hours.   Lipid Panel     Component Value Date/Time   CHOL 140 03/26/2016 0815   TRIG 39 03/26/2016 0815   HDL 67 03/26/2016 0815   CHOLHDL 2.1 03/26/2016 0815   VLDL 8 03/26/2016 0815   LDLCALC 65 03/26/2016 0815      Imaging:  No results found. LHC 03/26/16:  Ost LM to LM lesion, 50 %stenosed.  Ost Cx lesion, 75 %stenosed.  Mid Cx lesion, 30 %stenosed.  3rd Mrg lesion, 50 %stenosed.  Prox RCA lesion, 30 %stenosed.  Dist RCA lesion, 65 %stenosed.  RPDA lesion, 50 %stenosed.  The left ventricular systolic function is normal.  LV end diastolic pressure is normal.  The left ventricular ejection fraction is greater than 65% by visual estimate.  Hyperdynamic LV function without focal segmental wall motion abnormalities.  Coronary calcification with 50% distal left main stenosis, a normal LAD, 75% ostial circumflex stenosis with 30% mid circumflex and 50% distal marginal stenosis and 30% proximal RCA  stenosis with 60-70% distal stenosis proximal to the PDA takeoff and 50% distal PDA stenosis.  Echo 03/26/16: Study Conclusions  - Left ventricle: The cavity size was normal. Wall thickness was increased in a pattern of severe LVH. Systolic function was normal. The estimated ejection fraction was in the range of 55% to 60%. Wall motion was normal; there were no regional wall motion abnormalities. Doppler parameters are consistent with abnormal left ventricular relaxation (grade 1 diastolic dysfunction). Doppler parameters are consistent with high ventricular filling pressure. - Left atrium: The atrium was mildly dilated. - Right atrium: The atrium was mildly dilated. - Pericardium, extracardiac: A small pericardial effusion was identified.    Impressions:  - Normal LV systolic function; grade 1 diastolic dysfunction with elevated LV filling pressure; severe LVH; mild biatrial enlargement; small pericardial effusion; myocardium has speckled appearance concerning for amyloid; suggest further evaluation.    Assessment/Plan:   Principal Problem:   STEMI (ST elevation myocardial infarction) ([place]) Active Problems:   Hypertension   Hyperlipidemia   H/O: CVA (cerebrovascular accident)   ACS (abdominal compartment syndrome)   Acute coronary syndrome (HCC)   Chest pain   1. CAD/Day 4 s/p NTEMI:  Cath findings as above. Initial plan is for medical therapy. No chest pain, now on metoprolol 50 mg bid and imdur 30 mg.  2. AF with RVR:  Reverted back to NSR this am. Changed to full dose lovenox yesterday ; will start eliquis 5 mg bid with [id number] score of at least 6.   3. 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  4. Hyperlipidemia:  On atorvastatin 80 mg daily; LDL 65  5. H/o CVA  6. Hyponatrmia: Na 129 yesterday improved to 135 today. ;  TSH 3.8   Can probably transfer to telemetry after  MRI study.     Troy Sine, MD, [facility] 03/30/2016, 8:47 AM

## 2016-03-30 NOTE — Progress Notes (Signed)
CARDIAC REHAB PHASE I   PRE:  Rate/Rhythm: 42 SR    BP: sitting 123/76    SaO2:   MODE:  Ambulation: 350 ft   POST:  Rate/Rhythm: 88 SR with PVC/PACs    BP: sitting 143/85     SaO2:   Pt reluctant to walk at first. Tolerated fairly well, no major c/o, although he is not always very clear with questioning. To recliner. He did have much more ectopy after walking. [id number]   [place], ACSM 03/30/2016 11:20 AM

## 2016-03-30 NOTE — Care Management Note (Addendum)
Case Management Note  Patient Details  Name: WESLY COUNIHAN MRN: [id number] Date of Birth: [birth year]  Subjective/Objective:   Adm w stemi                 Action/Plan: lives at home per hcart   Expected Discharge Date:                  Expected Discharge Plan:  Home/Self Care  In-House Referral:     Discharge planning Services  CM Consult, Medication Assistance  Post Acute Care Choice:    Choice offered to:     DME Arranged:    DME Agency:     HH Arranged:    HH Agency:     Status of Service:     If discussed at [employer], dates discussed:    Additional Comments: gave pt 30day free eliquis card. [employer] medicare ins.S/W [place] @ [employer] # [phone]   ELIQUIS 5 MG BID    AND   2.5 MG BID   COVER- YES               YES  CO-PAY- $ 47.00           $47.00  TIER- 2 DRUG              2 DRUG  PRIOR APPROVAL - YES # [phone] YES  [facility] : [facility]   MAIL-ORDER 90 DAY SUPPLY $94.00 Lacretia Leigh, RN 03/30/2016, 9:25 AM

## 2016-03-31 LAB — PROTEIN ELECTROPHORESIS, SERUM
A/G Ratio: 1.3 (ref 0.7–1.7)
ALBUMIN ELP: 3 g/dL (ref 2.9–4.4)
ALPHA-1-GLOBULIN: 0.3 g/dL (ref 0.0–0.4)
Alpha-2-Globulin: 0.6 g/dL (ref 0.4–1.0)
Beta Globulin: 0.7 g/dL (ref 0.7–1.3)
GAMMA GLOBULIN: 0.9 g/dL (ref 0.4–1.8)
GLOBULIN, TOTAL: 2.4 g/dL (ref 2.2–3.9)
TOTAL PROTEIN ELP: 5.4 g/dL — AB (ref 6.0–8.5)

## 2016-03-31 NOTE — Care Management Important Message (Signed)
Important Message  Patient Details  Name: Gregory [name] MRN: [id number] Date of Birth: [birth year]   Medicare Important Message Given:  Yes    Loann Quill 03/31/2016, 11:44 AM

## 2016-03-31 NOTE — Progress Notes (Signed)
Patient upset that eye drops are scheduled for the am. States that he takes them at night. Will advise am RN to let the physician know.

## 2016-03-31 NOTE — Progress Notes (Signed)
.   Subjective:  No chest pain or dyspnea  Objective:   Vital Signs : Vitals:   03/30/16 1124 03/30/16 1504 03/30/16 2038 03/31/16 0445  BP: 130/68 131/75 134/73 129/79  Pulse:  66 71 67  Resp:  _0 Temp: 97.1 F (36.2 C) 97.7 F (36.5 C) 97.6 F (36.4 C) 98.2 F (36.8 C)  TempSrc: Oral Oral Oral Oral  SpO2: 97% 95% 100% 100%  Weight:      Height:        Intake/Output from previous day:  Intake/Output Summary (Last 24 hours) at 03/31/16 1007 Last data filed at 03/31/16 0800  Gross per 24 hour  Intake              480 ml  Output              150 ml  Net              330 ml    I/O since admission: +1312   Wt Readings from Last 3 Encounters:  03/26/16 173 lb 1 oz (78.5 kg)  02/15/15 180 lb (81.6 kg)  10/03/14 176 lb 9.4 oz (80.1 kg)      Medications: . apixaban  5 mg Oral BID  . aspirin  81 mg Oral Daily  . atorvastatin  80 mg Oral q1800  . brimonidine  1 drop Both Eyes Daily  . isosorbide mononitrate  30 mg Oral Daily  . metoprolol tartrate  50 mg Oral BID  . sodium chloride flush  3 mL Intravenous Q12H  . traMADol  50 mg Oral Q12H       Physical Exam:   General appearance: alert, cooperative and no distress Neck: no adenopathy, no JVD, supple, symmetrical, trachea midline and thyroid not enlarged, symmetric, no tenderness/mass/nodules Lungs: no wheezing Heart: irregularly irregular rhythm and tachycardic at 120; 1/6 sem, Abdomen: soft, non-tender; bowel sounds normal; no masses,  no organomegaly Extremities: no edema, redness or tenderness in the calves or thighs Neurologic: Grossly normal   Rate: 74  Rhythm: NSR  Will repeat today since converted to sinus rhythm.  ECG (independently read by me): AFlutter with variable block 100  ECG (independently read by me): NSR at 99; RBBB   Lab Results:   Recent Labs  03/29/16 0128 03/30/16 0250  NA 129* 135  K 3.9 4.1  CL 102 104  CO2 21* 24  GLUCOSE 102* 109*  BUN 19 21*  CREATININE  0.93 1.17  CALCIUM 8.2* 8.6*  MG  --  2.0    Hepatic Function Latest Ref Rng & Units 03/30/2016 03/26/2016 10/02/2014  Total Protein 6.5 - 8.1 g/dL 5.8(L) 6.8 6.3  Albumin 3.5 - 5.0 g/dL 2.7(L) 4.0 3.9  AST 15 - 41 U/L 42(H) 62(H) 33  ALT 17 - 63 U/L _1 Alk Phosphatase 38 - 126 U/L 63 84 69  Total Bilirubin 0.3 - 1.2 mg/dL 1.5(H) 3.7(H) 1.5(H)     Recent Labs  03/30/16 0250  WBC 9.5  HGB 13.1  HCT 38.9*  MCV 87.6  PLT 136*    No results for input(s): TROPONINI in the last 72 hours.  Invalid input(s): CK, MB   No results for input(s): HGBA1C in the last 72 hours.   Recent Labs  03/30/16 0250  PROT 5.8*  ALBUMIN 2.7*  AST 42*  ALT 31  ALKPHOS 63  BILITOT 1.5*   No results for input(s): INR in the last 72 hours. BNP (last  3 results) No results for input(s): BNP in the last 8760 hours.  ProBNP (last 3 results) No results for input(s): PROBNP in the last 8760 hours.   Lipid Panel     Component Value Date/Time   CHOL 140 03/26/2016 0815   TRIG 39 03/26/2016 0815   HDL 67 03/26/2016 0815   CHOLHDL 2.1 03/26/2016 0815   VLDL 8 03/26/2016 0815   LDLCALC 65 03/26/2016 0815      Imaging:  Mr Card Morphology Wo/w Cm  Result Date: 03/30/2016 CLINICAL DATA:  Assess for cardiac amyloidosis EXAM: CARDIAC MRI TECHNIQUE: The patient was scanned on a 1.5 Tesla GE magnet. A dedicated cardiac coil was used. Functional imaging was done using Fiesta sequences. 2,3, and 4 chamber views were done to assess for RWMA's. Modified Simpson's rule using a short axis stack was used to calculate an ejection fraction on a dedicated work Conservation officer, nature. The patient received 30 cc of Multihance. After 10 minutes inversion recovery sequences were used to assess for infiltration and scar tissue. FINDINGS: Limited images of the lung fields showed no definite abnormalities. There was a pericardial effusion, it was circumferential and appeared moderate laterally. The left  ventricle was normal in size with severe LV hypertrophy. There was some regionality with the septum thicker than the lateral wall. No mitral valve systolic anterior motion. EF 49% with mild diffuse hypokinesis. Mildly dilated right ventricle with normal systolic function. There does appear to be a possible thrombus in the trabeculae of the basal right ventricular free wall. Mild mitral regurgitation. Trileaflet aortic valve with no stenosis or regurgitation. Mild biatrial enlargement. On delayed enhancement imaging, there was prominent late gadolinium enhancement (LGE) throughout the RV but especially at the base. Again, concern for thrombus RV basal free wall. There was diffuse subendocardial LGE in the LV, not confined to any one vascular distribution. MEASUREMENTS: MEASUREMENTS LV EDV 133 mL LV SV 65 mL LV EF 49% IMPRESSION: 1.  Pericardial effusion, moderate laterally. 2. Severe LV hypertrophy, some regionality with the septum thicker than the lateral wall. 3.  LV EF 49% with mild diffuse hypokinesis. 4. Mildly dilated RV with normal systolic function. Concern for possible thrombus along the RV basal free wall. 5. Diffuse LGE involving the RV and also diffuse subendocardial LGE in the LV. This is not confined to any one vascular distribution and can be seen with cardiac amyloidosis. Taken altogether, this scan is concerning for cardiac amyloidosis. Dalton Mclean Electronically Signed   By: Loralie Champagne M.D.   On: 03/30/2016 18:00   LHC 03/26/16:  Ost LM to LM lesion, 50 %stenosed.  Ost Cx lesion, 75 %stenosed.  Mid Cx lesion, 30 %stenosed.  3rd Mrg lesion, 50 %stenosed.  Prox RCA lesion, 30 %stenosed.  Dist RCA lesion, 65 %stenosed.  RPDA lesion, 50 %stenosed.  The left ventricular systolic function is normal.  LV end diastolic pressure is normal.  The left ventricular ejection fraction is greater than 65% by visual estimate.  Hyperdynamic LV function without focal segmental wall motion  abnormalities.  Coronary calcification with 50% distal left main stenosis, a normal LAD, 75% ostial circumflex stenosis with 30% mid circumflex and 50% distal marginal stenosis and 30% proximal RCA stenosis with 60-70% distal stenosis proximal to the PDA takeoff and 50% distal PDA stenosis.  Echo 03/26/16: Study Conclusions  - Left ventricle: The cavity size was normal. Wall thickness was increased in a pattern of severe LVH. Systolic function was normal. The estimated ejection fraction was  in the range of 55% to 60%. Wall motion was normal; there were no regional wall motion abnormalities. Doppler parameters are consistent with abnormal left ventricular relaxation (grade 1 diastolic dysfunction). Doppler parameters are consistent with high ventricular filling pressure. - Left atrium: The atrium was mildly dilated. - Right atrium: The atrium was mildly dilated. - Pericardium, extracardiac: A small pericardial effusion was identified.    Impressions:  - Normal LV systolic function; grade 1 diastolic dysfunction with elevated LV filling pressure; severe LVH; mild biatrial enlargement; small pericardial effusion; myocardium has speckled appearance concerning for amyloid; suggest further evaluation.   CARDIAC MRI MPRESSION: 1.  Pericardial effusion, moderate laterally.  2. Severe LV hypertrophy, some regionality with the septum thicker than the lateral wall.  3.  LV EF 49% with mild diffuse hypokinesis.  4. Mildly dilated RV with normal systolic function. Concern for possible thrombus along the RV basal free wall.  5. Diffuse LGE involving the RV and also diffuse subendocardial LGE in the LV. This is not confined to any one vascular distribution and can be seen with cardiac amyloidosis.  Taken altogether, this scan is concerning for cardiac amyloidosis.  Assessment/Plan:   Principal Problem:   STEMI (ST elevation myocardial infarction)  ([place]) Active Problems:   Hypertension   Hyperlipidemia   H/O: CVA (cerebrovascular accident)   ACS (abdominal compartment syndrome)   Acute coronary syndrome (HCC)   Chest pain   1. CAD/s/p NTEMI:  Cath findings as above. Initial plan is for medical therapy. No chest pain, now on metoprolol 50 mg bid and imdur 30 mg.  2. AF with RVR:  Reverted back to NSR this am. Changed to full dose lovenox yesterday ;  eliquis 5 mg bid started yesterday with cha2ds2vasc score of at least 6.   3. 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  There is diffuse LGE involving the RV and also diffuse subendocardial LGEin the LV. This is not confined to any one vascular distribution and can be seen with cardiac amyloidosis.  Protein ELP  Without M spike; Urinary ELP  light chain increased; normal immunofixation pattern.  4. Hyperlipidemia:  On atorvastatin 80 mg daily; LDL 65  5. H/o CVA  6. Hyponatrmia: Na 129 --> 135 today. ;  TSH 3.8   Will discuss possible amyloid with colleagues and  ?biopsy  Troy Sine, MD, [facility] 03/31/2016, 10:07 AM

## 2016-03-31 NOTE — Progress Notes (Signed)
CARDIAC REHAB PHASE I  Pt declined ambulation x2 today. Will follow up tomorrow.   Lenna Sciara, RN, BSN 03/31/2016 3:31 PM

## 2016-04-01 ENCOUNTER — Encounter (HOSPITAL_COMMUNITY): Payer: Self-pay | Admitting: Student

## 2016-04-01 DIAGNOSIS — I517 Cardiomegaly: Secondary | ICD-10-CM

## 2016-04-01 DIAGNOSIS — I480 Paroxysmal atrial fibrillation: Secondary | ICD-10-CM

## 2016-04-01 MED ORDER — APIXABAN 5 MG PO TABS: 5.0000 mg | ORAL_TABLET | Freq: Two times a day (BID) | ORAL | 10 refills | Status: DC

## 2016-04-01 MED ORDER — METOPROLOL TARTRATE 50 MG PO TABS
50.0000 mg | ORAL_TABLET | Freq: Two times a day (BID) | ORAL | 6 refills | Status: DC
Start: 2016-04-01 — End: 2016-08-22

## 2016-04-01 MED ORDER — ATORVASTATIN CALCIUM 80 MG PO TABS: 80.0000 mg | ORAL_TABLET | Freq: Every day | ORAL | 6 refills | Status: DC

## 2016-04-01 MED ORDER — LOSARTAN POTASSIUM 25 MG PO TABS: 25.0000 mg | ORAL_TABLET | Freq: Every day | ORAL | 6 refills | Status: DC

## 2016-04-01 MED ORDER — ISOSORBIDE MONONITRATE ER 30 MG PO TB24: 30.0000 mg | ORAL_TABLET | Freq: Every day | ORAL | 6 refills | Status: DC

## 2016-04-01 MED ORDER — LOSARTAN POTASSIUM 25 MG PO TABS
25.0000 mg | ORAL_TABLET | Freq: Every day | ORAL | Status: DC
  Administered 2016-04-01: 25 mg via ORAL
  Filled 2016-04-01: qty 1

## 2016-04-01 NOTE — Progress Notes (Signed)
.   Subjective:  No chest pain or dyspnea  Objective:   Vital Signs : Vitals:   03/31/16 0445 03/31/16 1304 03/31/16 2032 04/01/16 0509  BP: 129/79 111/66 137/73 119/80  Pulse: 67 (!) 58 68 62  Resp: '18 18 18 18  ' Temp: 98.2 F (36.8 C) 98.4 F (36.9 C) 98.2 F (36.8 C) 98.4 F (36.9 C)  TempSrc: Oral Oral Oral Oral  SpO2: 100% 98% 100% 98%  Weight:      Height:        Intake/Output from previous day:  Intake/Output Summary (Last 24 hours) at 04/01/16 0845 Last data filed at 03/31/16 1300  Gross per 24 hour  Intake              120 ml  Output                0 ml  Net              120 ml    I/O since admission: +1432  Wt Readings from Last 3 Encounters:  03/26/16 173 lb 1 oz (78.5 kg)  02/15/15 180 lb (81.6 kg)  10/03/14 176 lb 9.4 oz (80.1 kg)      Medications: . apixaban  5 mg Oral BID  . aspirin  81 mg Oral Daily  . atorvastatin  80 mg Oral q1800  . brimonidine  1 drop Both Eyes Daily  . isosorbide mononitrate  30 mg Oral Daily  . metoprolol tartrate  50 mg Oral BID  . sodium chloride flush  3 mL Intravenous Q12H  . traMADol  50 mg Oral Q12H         Physical Exam:   General appearance: alert, cooperative and no distress Neck: no adenopathy, no JVD, supple, symmetrical, trachea midline and thyroid not enlarged, symmetric, no tenderness/mass/nodules Lungs: no wheezing Heart: RRR 1/6 sem,no s3 Abdomen: soft, non-tender; bowel sounds normal; no masses,  no organomegaly Extremities: no edema, redness or tenderness in the calves or thighs Neurologic: Grossly normal   Rate: 74  Rhythm: NSR  03/30/16 ECG (independently read by me): NSR at 71; 1 AV block, RBBB  ECG (independently read by me): AFlutter with variable block 100  ECG (independently read by me): NSR at 99; RBBB   Lab Results:   Recent Labs  03/30/16 0250  NA 135  K 4.1  CL 104  CO2 24  GLUCOSE 109*  BUN 21*  CREATININE 1.17  CALCIUM 8.6*  MG 2.0    Hepatic Function Latest  Ref Rng & Units 03/30/2016 03/26/2016 10/02/2014  Total Protein 6.5 - 8.1 g/dL 5.8(L) 6.8 6.3  Albumin 3.5 - 5.0 g/dL 2.7(L) 4.0 3.9  AST 15 - 41 U/L 42(H) 62(H) 33  ALT 17 - 63 U/L '31 30 15  ' Alk Phosphatase 38 - 126 U/L 63 84 69  Total Bilirubin 0.3 - 1.2 mg/dL 1.5(H) 3.7(H) 1.5(H)     Recent Labs  03/30/16 0250  WBC 9.5  HGB 13.1  HCT 38.9*  MCV 87.6  PLT 136*    No results for input(s): TROPONINI in the last 72 hours.  Invalid input(s): CK, MB   No results for input(s): HGBA1C in the last 72 hours.   Recent Labs  03/30/16 0250  PROT 5.8*  ALBUMIN 2.7*  AST 42*  ALT 31  ALKPHOS 63  BILITOT 1.5*   No results for input(s): INR in the last 72 hours. BNP (last 3 results) No results for input(s): BNP in the  last 8760 hours.  ProBNP (last 3 results) No results for input(s): PROBNP in the last 8760 hours.   Lipid Panel     Component Value Date/Time   CHOL 140 03/26/2016 0815   TRIG 39 03/26/2016 0815   HDL 67 03/26/2016 0815   CHOLHDL 2.1 03/26/2016 0815   VLDL 8 03/26/2016 0815   LDLCALC 65 03/26/2016 0815      Imaging:  Mr Card Morphology Wo/w Cm  Result Date: 03/30/2016 CLINICAL DATA:  Assess for cardiac amyloidosis EXAM: CARDIAC MRI TECHNIQUE: The patient was scanned on a 1.5 Tesla GE magnet. A dedicated cardiac coil was used. Functional imaging was done using Fiesta sequences. 2,3, and 4 chamber views were done to assess for RWMA's. Modified Simpson's rule using a short axis stack was used to calculate an ejection fraction on a dedicated work Conservation officer, nature. The patient received 30 cc of Multihance. After 10 minutes inversion recovery sequences were used to assess for infiltration and scar tissue. FINDINGS: Limited images of the lung fields showed no definite abnormalities. There was a pericardial effusion, it was circumferential and appeared moderate laterally. The left ventricle was normal in size with severe LV hypertrophy. There was some  regionality with the septum thicker than the lateral wall. No mitral valve systolic anterior motion. EF 49% with mild diffuse hypokinesis. Mildly dilated right ventricle with normal systolic function. There does appear to be a possible thrombus in the trabeculae of the basal right ventricular free wall. Mild mitral regurgitation. Trileaflet aortic valve with no stenosis or regurgitation. Mild biatrial enlargement. On delayed enhancement imaging, there was prominent late gadolinium enhancement (LGE) throughout the RV but especially at the base. Again, concern for thrombus RV basal free wall. There was diffuse subendocardial LGE in the LV, not confined to any one vascular distribution. MEASUREMENTS: MEASUREMENTS LV EDV 133 mL LV SV 65 mL LV EF 49% IMPRESSION: 1.  Pericardial effusion, moderate laterally. 2. Severe LV hypertrophy, some regionality with the septum thicker than the lateral wall. 3.  LV EF 49% with mild diffuse hypokinesis. 4. Mildly dilated RV with normal systolic function. Concern for possible thrombus along the RV basal free wall. 5. Diffuse LGE involving the RV and also diffuse subendocardial LGE in the LV. This is not confined to any one vascular distribution and can be seen with cardiac amyloidosis. Taken altogether, this scan is concerning for cardiac amyloidosis. Dalton Mclean Electronically Signed   By: Loralie Champagne M.D.   On: 03/30/2016 18:00   LHC 03/26/16:  Ost LM to LM lesion, 50 %stenosed.  Ost Cx lesion, 75 %stenosed.  Mid Cx lesion, 30 %stenosed.  3rd Mrg lesion, 50 %stenosed.  Prox RCA lesion, 30 %stenosed.  Dist RCA lesion, 65 %stenosed.  RPDA lesion, 50 %stenosed.  The left ventricular systolic function is normal.  LV end diastolic pressure is normal.  The left ventricular ejection fraction is greater than 65% by visual estimate.  Hyperdynamic LV function without focal segmental wall motion abnormalities.  Coronary calcification with 50% distal left main  stenosis, a normal LAD, 75% ostial circumflex stenosis with 30% mid circumflex and 50% distal marginal stenosis and 30% proximal RCA stenosis with 60-70% distal stenosis proximal to the PDA takeoff and 50% distal PDA stenosis.  Echo 03/26/16: Study Conclusions  - Left ventricle: The cavity size was normal. Wall thickness was increased in a pattern of severe LVH. Systolic function was normal. The estimated ejection fraction was in the range of 55% to 60%. Wall motion  was normal; there were no regional wall motion abnormalities. Doppler parameters are consistent with abnormal left ventricular relaxation (grade 1 diastolic dysfunction). Doppler parameters are consistent with high ventricular filling pressure. - Left atrium: The atrium was mildly dilated. - Right atrium: The atrium was mildly dilated. - Pericardium, extracardiac: A small pericardial effusion was identified.    Impressions:  - Normal LV systolic function; grade 1 diastolic dysfunction with elevated LV filling pressure; severe LVH; mild biatrial enlargement; small pericardial effusion; myocardium has speckled appearance concerning for amyloid; suggest further evaluation.   CARDIAC MRI MPRESSION: 1.  Pericardial effusion, moderate laterally.  2. Severe LV hypertrophy, some regionality with the septum thicker than the lateral wall.  3.  LV EF 49% with mild diffuse hypokinesis.  4. Mildly dilated RV with normal systolic function. Concern for possible thrombus along the RV basal free wall.  5. Diffuse LGE involving the RV and also diffuse subendocardial LGE in the LV. This is not confined to any one vascular distribution and can be seen with cardiac amyloidosis.  Taken altogether, this scan is concerning for cardiac amyloidosis.  Assessment/Plan:   Principal Problem:   STEMI (ST elevation myocardial infarction) ([place]) Active Problems:   Hypertension   Hyperlipidemia   H/O: CVA  (cerebrovascular accident)   ACS (abdominal compartment syndrome)   Acute coronary syndrome (HCC)   Chest pain   1. CAD/s/p NTEMI:  Cath findings as above. Initial plan is for medical therapy. No chest pain, now on metoprolol 50 mg bid and imdur 30 mg. No recurrent chest pain.  With diastolic dysfuncton will add very low dose ARB with losartan 25 mg initially and titrate as out[patient.  2. AF with RVR:  Reverted back to NSR this am. Changed to full dose lovenox yesterday ;  eliquis 5 mg bid started yesterday with [id number] score of at least 6.  Maintaining NSR without recurrent AF.  3. 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  There is diffuse LGE involving the RV and also diffuse subendocardial LGEin the LV. This is not confined to any one vascular distribution and can be seen with cardiac amyloidosis.  Protein ELP  Without M spike; Urinary ELP  light chain increased; normal immunofixation pattern.  4. Hyperlipidemia:  On atorvastatin 80 mg daily; LDL 65  5. H/o CVA  6. Hyponatrmia: Na 129 --> 135  ;  TSH 3.8   Discussed findings with Dr. Aundra Dubin.   No M spike on SPEP; nl immunofixation pattern.  Will treat medically. Will dc today and f/u in office.    Troy Sine, MD, [facility] 04/01/2016, 8:45 AM

## 2016-04-01 NOTE — Progress Notes (Signed)
CARDIAC REHAB PHASE I   Pt declines ambulation again today, encouraged pt to ambulate independently as tolerated. Pt for discharge today. Completed MI education with pt at bedside.  Reviewed risk factors, MI book, a.fib, anticoagulation, activity restrictions, ntg, exercise, heart healthy diet, sodium restrictions and phase 2 cardiac rehab. Pt verbalized understanding, pt somewhat distracted, moderately receptive, unlikely to make any dietary changes. Pt agrees to phase 2 cardiac rehab referral, will send to [facility] per pt request. Pt in bed, call bell within reach.   [place], RN, BSN 04/01/2016 9:47 AM

## 2016-04-01 NOTE — Discharge Summary (Signed)
Discharge Summary    Patient ID: Gregory [name],  MRN: [id number], DOB/AGE: 09-22-1929 82 y.o.  Admit date: 03/26/2016 Discharge date: 04/01/2016  Primary Care Provider: [name] Primary Cardiologist: New - Dr. [name]  Discharge Diagnoses    Principal Problem:   NSTEMI (non-ST elevated myocardial infarction) [facility]) Active Problems:   Hypertension   Hyperlipidemia   H/O: CVA (cerebrovascular accident)   Acute coronary syndrome (HCC)   Chest pain   LVH (left ventricular hypertrophy)   PAF (paroxysmal atrial fibrillation) ([place])    History of Present Illness     Gregory [name] is a 82 y/o AAM with past medical history of HTN, HLD, prior nonhemorrhagic CVA in 2015 and GIB in 2016 (secondary to gastritis and diverticulosis) who presented to [facility] on 03/26/2016 with CP and EKG abnormalities meeting STEMI criteria (new RBBB with slight ST elevation).   He reported being in his usual state of health until the night of 03/25/16, when he developed episodes of substernal chest pain. It persisted through the night and was associated with mild dyspnea and nausea. With his persisting symptoms, he drove himself to the ED the following morning.  EKG showed new RBBB with slight ST elevations. Code STEMI was activated and he was taken to the cath lab for an emergent cardiac catheterization.    Hospital Course     Consultants: None  His cath showed no acute occlusions but diffuse disease was noted with 50% ostial LM stenosis, 75% ostial Cx, 50% 3rd Mrg, 65% distal RCA, and 50% RPDA. No intervention was performed and medical therapy was recommended. This was therefore classified as an NSTEMI.  He tolerated the procedure well and no immediate complications were noted.  The following morning, he reported having pleuritic chest pain and was tachycardiac into the 120's. A CTA was obtained and negative for PE. An echocardiogram showed a preserved EF of  55% to 60% with no regional wall motion  abnormalities. Grade 1 DD present and it was noted the myocardium had a speckled appearance concerning for amyloid. SPEP and UPEP were checked with protein ELP without M spike. Cardiac MRI was obtained which showed a moderate pericardial effusion, severe LV hypertrophy, mildly dilated RV with normal systolic function, and concern for possible thrombus along the RV basal free wall. Diffuse LGE involving the RV and also diffuse subendocardial LGE in the LV was noted which can be seen with cardiac amyloidosis. These results were discussed between Dr. [name] and Dr. Aundra Dubin who with no M spike noted on SPEP and a normal immunofixation pattern decided this will currently be treated medically.  During the admission, he was noted to be in atrial fibrillation with RVR. His Lovenox was switched from DVT to full-dose and Lopressor was titrated from 25 mg BID to 50mg  BID. He returned to NSR on 8/15 and with his CHA2DS2-VASc Score of at least 6, he was switched from Lovenox to Eliquis 5mg  BID. Provided with 30-day card by Case Management.   On 8/17, he was examined by Dr. [name] and deemed stable for discharge. In relation to his Medical therapy will include ASA (verified with Dr. [name] to continue both ASA and Eliquis at this time), Lopressor, Imdur, Atorvastatin and Losartan (doses listed below). A 10-day TCM appointment has been arranged at the [facility] office.  _____________  Discharge Vitals Blood pressure 122/64, pulse 63, temperature 98.3 F (36.8 C), temperature source Oral, resp. rate 18, height 5\' 11"  (1.803 m), weight 173 lb 1 oz (  78.5 kg), SpO2 99 %.  Filed Weights   03/26/16 0809 03/26/16 0945  Weight: 172 lb 6 oz (78.2 kg) 173 lb 1 oz (78.5 kg)    Labs & Radiologic Studies     CBC  Recent Labs  03/30/16 0250  WBC 9.5  HGB 13.1  HCT 38.9*  MCV 87.6  PLT [id number]*   Basic Metabolic Panel  Recent Labs  03/30/16 0250  NA 135  K 4.1  CL 104  CO2 24  GLUCOSE 109*  BUN 21*  CREATININE  1.17  CALCIUM 8.6*  MG 2.0   Liver Function Tests  Recent Labs  03/30/16 0250  AST 42*  ALT 31  ALKPHOS 63  BILITOT 1.5*  PROT 5.8*  ALBUMIN 2.7*   Thyroid Function Tests  Recent Labs  03/30/16 0250  TSH 3.848    Ct Angio Chest Pe W Or Wo Contrast  Result Date: 03/27/2016 CLINICAL DATA:  Patient with persistent chest pain. EXAM: CT ANGIOGRAPHY CHEST WITH CONTRAST TECHNIQUE: Multidetector CT imaging of the chest was performed using the standard protocol during bolus administration of intravenous contrast. Multiplanar CT image reconstructions and MIPs were obtained to evaluate the vascular anatomy. CONTRAST:  100 cc Isovue 370 COMPARISON:  Chest radiograph 03/26/2016 FINDINGS: Cardiovascular: Heart is enlarged. Small pericardial effusion. Aorta and main pulmonary artery normal in caliber. Aortic and coronary arterial vascular calcifications. Adequate opacification of the pulmonary arterial system. Motion artifact limits evaluation. No evidence for pulmonary embolism. Mediastinum/Nodes: No axillary, mediastinal or hilar lymphadenopathy. Esophagus is unremarkable. Lungs/Pleura: Central airways are patent. Dependent ground-glass and consolidative pulmonary opacities within the right and left lower lobes. Trace pleural effusions bilaterally. No pneumothorax. Upper Abdomen: Cholelithiasis. Musculoskeletal: Aggressive or acute appearing osseous lesions. Review of the MIP images confirms the above findings. IMPRESSION: No evidence for pulmonary embolism. Cardiomegaly.  Small pericardial effusion. Bilateral lower lobe subpleural consolidative opacities favored to represent atelectasis. Small bilateral pleural effusions. Aortic vascular calcifications. Electronically Signed   By: Lovey Newcomer M.D.   On: 03/27/2016 11:37   Dg Chest Port 1 View  Result Date: 03/26/2016 CLINICAL DATA:  Chest pain EXAM: PORTABLE CHEST 1 VIEW COMPARISON:  07/09/2015 FINDINGS: Cardiac shadow remains enlarged. Mild aortic  calcifications are again noted. The lungs are clear bilaterally. No bony abnormality is seen. IMPRESSION: No active disease. Electronically Signed   By: Inez Catalina M.D.   On: 03/26/2016 10:49   Mr Card Morphology Wo/w Cm  Result Date: 03/30/2016 CLINICAL DATA:  Assess for cardiac amyloidosis EXAM: CARDIAC MRI TECHNIQUE: The patient was scanned on a 1.5 Tesla GE magnet. A dedicated cardiac coil was used. Functional imaging was done using Fiesta sequences. 2,3, and 4 chamber views were done to assess for RWMA's. Modified Simpson's rule using a short axis stack was used to calculate an ejection fraction on a dedicated work Conservation officer, nature. The patient received 30 cc of Multihance. After 10 minutes inversion recovery sequences were used to assess for infiltration and scar tissue. FINDINGS: Limited images of the lung fields showed no definite abnormalities. There was a pericardial effusion, it was circumferential and appeared moderate laterally. The left ventricle was normal in size with severe LV hypertrophy. There was some regionality with the septum thicker than the lateral wall. No mitral valve systolic anterior motion. EF 49% with mild diffuse hypokinesis. Mildly dilated right ventricle with normal systolic function. There does appear to be a possible thrombus in the trabeculae of the basal right ventricular free wall. Mild mitral regurgitation.  Trileaflet aortic valve with no stenosis or regurgitation. Mild biatrial enlargement. On delayed enhancement imaging, there was prominent late gadolinium enhancement (LGE) throughout the RV but especially at the base. Again, concern for thrombus RV basal free wall. There was diffuse subendocardial LGE in the LV, not confined to any one vascular distribution. MEASUREMENTS: MEASUREMENTS LV EDV 133 mL LV SV 65 mL LV EF 49% IMPRESSION: 1.  Pericardial effusion, moderate laterally. 2. Severe LV hypertrophy, some regionality with the septum thicker than the  lateral wall. 3.  LV EF 49% with mild diffuse hypokinesis. 4. Mildly dilated RV with normal systolic function. Concern for possible thrombus along the RV basal free wall. 5. Diffuse LGE involving the RV and also diffuse subendocardial LGE in the LV. This is not confined to any one vascular distribution and can be seen with cardiac amyloidosis. Taken altogether, this scan is concerning for cardiac amyloidosis. Dalton Mclean Electronically Signed   By: Loralie Champagne M.D.   On: 03/30/2016 18:00     Diagnostic Studies/Procedures     Cardiac Catheterization: 03/26/2016  Ost LM to LM lesion, 50 %stenosed.  Ost Cx lesion, 75 %stenosed.  Mid Cx lesion, 30 %stenosed.  3rd Mrg lesion, 50 %stenosed.  Prox RCA lesion, 30 %stenosed.  Dist RCA lesion, 65 %stenosed.  RPDA lesion, 50 %stenosed.  The left ventricular systolic function is normal.  LV end diastolic pressure is normal.  The left ventricular ejection fraction is greater than 65% by visual estimate.   Hyperdynamic LV function without focal segmental wall motion abnormalities.  Coronary calcification with 50% distal left main stenosis, a normal LAD, 75% ostial circumflex stenosis with 30% mid circumflex and 50% distal marginal stenosis and 30% proximal RCA stenosis with 60-70% distal stenosis proximal to the PDA takeoff and 50% distal PDA stenosis.  RECOMMENDATION: Recommended initial medical therapy trial with aggressive medical management.    Echocardiogram: 03/26/2016 Study Conclusions  - Left ventricle: The cavity size was normal. Wall thickness was   increased in a pattern of severe LVH. Systolic function was   normal. The estimated ejection fraction was in the range of 55%   to 60%. Wall motion was normal; there were no regional wall   motion abnormalities. Doppler parameters are consistent with   abnormal left ventricular relaxation (grade 1 diastolic   dysfunction). Doppler parameters are consistent with high    ventricular filling pressure. - Left atrium: The atrium was mildly dilated. - Right atrium: The atrium was mildly dilated. - Pericardium, extracardiac: A small pericardial effusion was   identified.  Impressions:  - Normal LV systolic function; grade 1 diastolic dysfunction with   elevated LV filling pressure; severe LVH; mild biatrial   enlargement; small pericardial effusion; myocardium has speckled   appearance concerning for amyloid; suggest further evaluation.    Disposition   Pt is being discharged home today in good condition.  Follow-up Plans & Appointments    Follow-up Information    Murray Hodgkins, NP Follow up on 04/14/2016.   Specialties:  Nurse Practitioner, Cardiology, Radiology Why:  Cardiology Hospital Follow-up on 04/14/2016 at 10:30AM. Contact information: [address] [place] [address] [phone]          Discharge Instructions    Amb Referral to Cardiac Rehabilitation    Complete by:  As directed   Diagnosis:  NSTEMI   Diet - low sodium heart healthy    Complete by:  As directed   Discharge instructions    Complete by:  As  directed   PLEASE REMEMBER TO BRING ALL OF YOUR MEDICATIONS TO EACH OF YOUR FOLLOW-UP OFFICE VISITS.  PLEASE ATTEND ALL SCHEDULED FOLLOW-UP APPOINTMENTS.   Activity: Increase activity slowly as tolerated. You may shower, but no soaking baths (or swimming) for 1 week. No driving for 1 week. No lifting over 10 lbs for 2 weeks. No sexual activity for 2 weeks.   You May Return to Work: in 1 week (if applicable)  Wound Care: You may wash cath site gently with soap and water. Keep cath site clean and dry. If you notice pain, swelling, bleeding or pus at your cath site, please call [phone].   Increase activity slowly    Complete by:  As directed      Discharge Medications     Medication List    STOP taking these medications   amLODipine 5 MG tablet Commonly known as:  NORVASC     TAKE these  medications   apixaban 5 MG Tabs tablet Commonly known as:  ELIQUIS Take 1 tablet (5 mg total) by mouth 2 (two) times daily.   aspirin EC 81 MG tablet Take 81 mg by mouth daily.   atorvastatin 80 MG tablet Commonly known as:  LIPITOR Take 1 tablet (80 mg total) by mouth daily. What changed:  medication strength  how much to take   brimonidine 0.2 % ophthalmic solution Commonly known as:  ALPHAGAN Place 1 drop into both eyes at bedtime.   isosorbide mononitrate 30 MG 24 hr tablet Commonly known as:  IMDUR Take 1 tablet (30 mg total) by mouth daily.   losartan 25 MG tablet Commonly known as:  COZAAR Take 1 tablet (25 mg total) by mouth daily.   metoprolol 50 MG tablet Commonly known as:  LOPRESSOR Take 1 tablet (50 mg total) by mouth 2 (two) times daily.       Aspirin prescribed at discharge?  Yes High Intensity Statin Prescribed? (Lipitor 40-80mg  or Crestor 20-40mg ): Yes Beta Blocker Prescribed? Yes For EF 40% or less, Was ACEI/ARB Prescribed? Yes ADP Receptor Inhibitor Prescribed? (i.e. Plavix etc.-Includes Medically Managed Patients): Yes For EF <40%, Aldosterone Inhibitor Prescribed? No: N/A, EF > 40%. Was EF assessed during THIS hospitalization? Yes Was Cardiac Rehab II ordered? (Included Medically managed Patients): Yes   Allergies Allergies  Allergen Reactions  . Sulfa Antibiotics   . Ramipril      Outstanding Labs/Studies   None  Duration of Discharge Encounter   Greater than 30 minutes including physician time.  Signed, Erma Heritage, PA-C 04/01/2016, 9:31 PM

## 2016-04-02 ENCOUNTER — Telehealth: Payer: Self-pay

## 2016-04-02 NOTE — Telephone Encounter (Signed)
Prior auth for Eliquis 5 mg obtained from [facility]. Good through 08/15/2016. PA- [id number].

## 2016-04-14 ENCOUNTER — Ambulatory Visit (INDEPENDENT_AMBULATORY_CARE_PROVIDER_SITE_OTHER): Payer: Medicare Other | Admitting: Nurse Practitioner

## 2016-04-14 ENCOUNTER — Encounter: Payer: Self-pay | Admitting: Nurse Practitioner

## 2016-04-14 VITALS — BP 132/64 | HR 72 | Ht 71.0 in | Wt 172.6 lb

## 2016-04-14 DIAGNOSIS — E7800 Pure hypercholesterolemia, unspecified: Secondary | ICD-10-CM

## 2016-04-14 DIAGNOSIS — E785 Hyperlipidemia, unspecified: Secondary | ICD-10-CM

## 2016-04-14 DIAGNOSIS — I214 Non-ST elevation (NSTEMI) myocardial infarction: Secondary | ICD-10-CM | POA: Diagnosis not present

## 2016-04-14 DIAGNOSIS — I480 Paroxysmal atrial fibrillation: Secondary | ICD-10-CM | POA: Diagnosis not present

## 2016-04-14 DIAGNOSIS — I2510 Atherosclerotic heart disease of native coronary artery without angina pectoris: Secondary | ICD-10-CM

## 2016-04-14 DIAGNOSIS — I119 Hypertensive heart disease without heart failure: Secondary | ICD-10-CM | POA: Insufficient documentation

## 2016-04-14 LAB — CBC
HCT: 41.4 % (ref 38.5–50.0)
Hemoglobin: 13.7 g/dL (ref 13.2–17.1)
MCH: 29.3 pg (ref 27.0–33.0)
MCHC: 33.1 g/dL (ref 32.0–36.0)
MCV: 88.5 fL (ref 80.0–100.0)
MPV: 12.8 fL — AB (ref 7.5–12.5)
PLATELETS: 196 10*3/uL (ref 140–400)
RBC: 4.68 MIL/uL (ref 4.20–5.80)
RDW: 14.7 % (ref 11.0–15.0)
WBC: 6.8 10*3/uL (ref 3.8–10.8)

## 2016-04-14 LAB — BASIC METABOLIC PANEL
BUN: 13 mg/dL (ref 7–25)
CALCIUM: 8.9 mg/dL (ref 8.6–10.3)
CO2: 25 mmol/L (ref 20–31)
Chloride: 105 mmol/L (ref 98–110)
Creat: 0.84 mg/dL (ref 0.70–1.11)
Glucose, Bld: 82 mg/dL (ref 65–99)
Potassium: 4.5 mmol/L (ref 3.5–5.3)
SODIUM: 137 mmol/L (ref 135–146)

## 2016-04-14 NOTE — Progress Notes (Signed)
Office Visit    Patient Name: Gregory [name] Date of Encounter: 04/14/2016  Primary Care Provider:  [name], MD Primary Cardiologist:  Corky Downs, MD   Chief Complaint    82 year old male status post recent admission for non-STEMI and paroxysmal A. fib who presents for follow-up.  Past Medical History    Past Medical History:  Diagnosis Date  . Arthritis   . CAD (coronary artery disease)    a. cath 03/2016: diffuse disease w/ 50% ostial LM stenosis, 75% ostial Cx, 50% 3rd Mrg, 65% distal RCA, and 50% RPDA. Medical management recommended.  Marland Kitchen GIB (gastrointestinal bleeding)    a. occurred in 2016, secondary to gastritis and diverticulosis  . Hypercholesteremia   . Hypertensive heart disease   . LVH (left ventricular hypertrophy)    a. 03/2016 Echo: EF 55-65%, no rwma, Gr1 DD, sev LVH, mildly dil RA/LA, small peric eff;  b. 03/2016 Cardiac MRI: sev LVH, mildly dil RV, possible RV thrombus, diffuse LGE involving RV and diff subendocardial LGE in LV. *No M spike on SPEP, nl immunofixation pattern - no clear evidence of cardiac amyloidosis.  . Murmur, cardiac   . PAF (paroxysmal atrial fibrillation) ([employer])    a. new-onset 03/2016, placed on Eliquis (CHA2DS2VASc = 6).  . Poor appetite   . Stroke [facility])    a. nonhemorrhagic CVA in 2015   Past Surgical History:  Procedure Laterality Date  . CARDIAC CATHETERIZATION N/A 03/26/2016   Procedure: Left Heart Cath and Coronary Angiography;  Surgeon: Troy Sine, MD;  Location: [place] [facility];  Service: Cardiovascular;  Laterality: N/A;  . COLONOSCOPY WITH PROPOFOL N/A 10/04/2014   Procedure: COLONOSCOPY WITH PROPOFOL;  Surgeon: Laurence Spates, MD;  Location: [place];  Service: Endoscopy;  Laterality: N/A;  . ESOPHAGOGASTRODUODENOSCOPY N/A 10/03/2014   Procedure: ESOPHAGOGASTRODUODENOSCOPY (EGD);  Surgeon: Winfield Cunas., MD;  Location: [facility] ENDOSCOPY;  Service: Endoscopy;  Laterality: N/A;    Allergies  Allergies    Allergen Reactions  . Sulfa Antibiotics   . Ramipril   . Ramipril Other (See Comments)  . Sulfamethoxazole Rash    History of Present Illness    82 year old with a prior history of hypertension, hyperlipidemia, nonhemorrhagic CVA in 2015, and GI bleed in 2016, he was recently admitted to [facility] on August 11 with chest pain, new right bundle branch block, and EKG abnormalities concerning for STEMI. He underwent diagnostic catheterization revealing moderate diffuse CAD but no targets for intervention. He subsequently ruled in for MI and was ultimately labeled a non-STEMI. During admission, he had paroxysmal atrial fibrillation but converted to sinus rhythm on the morning of discharge. Eliquis was started. He also had pleuritic chest pain and tachycardia following admission. CT angios of the chest was negative for PE. Echocardiogram showed severe LVH and this was followed by a cardiac MRI which showed severe LVH, mildly dilated RV, and concern for possible thrombus along the RV basal free wall. Diffuse LGE involving the RV and also diffuse subendocardial LGE of the LV was noted andWas suspicious for cardiac amyloidosis. SPEP and UPEP were evaluated as well. There was no M spike on SPEP and immunofixation pattern was normal and thus it was felt that patient should be treated medically.   Since his discharge, he reports having done well. He denies chest pain, palpitations, dyspnea, PND, orthopnea, dizziness, syncope, edema. His appetite, which was poor prior to admission, remains poor.  Home Medications    Prior to Admission medications  Medication Sig Start Date End Date Taking? Authorizing Provider  amLODipine (NORVASC) 2.5 MG tablet Take 1 tablet by mouth daily.   Yes Historical Provider, MD  apixaban (ELIQUIS) 5 MG TABS tablet Take 1 tablet (5 mg total) by mouth 2 (two) times daily. 04/01/16  Yes Erma Heritage, PA  aspirin EC 81 MG tablet Take 81 mg by mouth daily.   Yes Historical  Provider, MD  atorvastatin (LIPITOR) 80 MG tablet Take 1 tablet (80 mg total) by mouth daily. 04/01/16  Yes Erma Heritage, PA  brimonidine (ALPHAGAN) 0.2 % ophthalmic solution Place 1 drop into both eyes at bedtime.   Yes Historical Provider, MD  isosorbide mononitrate (IMDUR) 30 MG 24 hr tablet Take 1 tablet (30 mg total) by mouth daily. 04/01/16  Yes Erma Heritage, PA  losartan (COZAAR) 25 MG tablet Take 1 tablet (25 mg total) by mouth daily. 04/01/16  Yes Erma Heritage, PA  metoprolol (LOPRESSOR) 50 MG tablet Take 1 tablet (50 mg total) by mouth 2 (two) times daily. 04/01/16  Yes Erma Heritage, PA    Review of Systems    As above, his appetite has been poor but he is otw doing well.  He denies chest pain, palpitations, dyspnea, pnd, orthopnea, n, v, dizziness, syncope, edema, weight gain.  All other systems reviewed and are otherwise negative except as noted above.  Physical Exam    VS:  BP 132/64   Pulse 72   Ht 5\' 11"  (1.803 m)   Wt 172 lb 9.6 oz (78.3 kg)   BMI 24.07 kg/m  , BMI Body mass index is 24.07 kg/m. GEN: Well nourished, well developed, in no acute distress.  HEENT: normal.  Neck: Supple, no JVD, carotid bruits, or masses. Cardiac: RRR 2/6 systolic murmur heard throughout but loudest at the apex. No rubs, or gallops. No clubbing, cyanosis, edema.  Radials/DP/PT 2+ and equal bilaterally.  Respiratory:  Respirations regular and unlabored, clear to auscultation bilaterally. GI: Soft, nontender, nondistended, BS + x 4. MS: no deformity or atrophy. Skin: warm and dry, no rash. Neuro:  Strength and sensation are intact. Psych: Normal affect.  Accessory Clinical Findings    ECG - sinus arrhythmia, first-degree AV block, left axis deviation, right bundle branch block with anterior T-wave inversion -no acute changes.  Assessment & Plan    1.  non-ST segment elevation myocardial infarction, subsequent episode of care/coronary artery disease: Patient was  recently hospitalized with chest pain and new right bundle branch block. Catheterization revealed moderate nonobstructive CAD and he has been medically managed with aspirin, statin, nitrate, and beta blocker therapy. He has not been having any chest pain.   2. Paroxysmal atrial fibrillation: Patient is maintaining sinus rhythm on beta blocker therapy. He is anticoagulated with eliquis and has been compliant. I will follow-up a CBC and basic metabolic panel today.  3. Severe LVH: Patient underwent workup for cardiac amyloid which was ultimately felt to be unrevealing. His heart rate and blood pressure under good control today and he is euvolemic on exam.  4. Hypertensive heart disease: Blood pressure stable on beta blocker and ARB therapy.  5. Hyperlipidemia: LDL was 65 during admission. He is on high potency statin therapy. We will arrange for follow-up lipids and LFTs in 6 weeks.  6. Disposition: Follow-up lipids and LFTs in 6 weeks with office follow-up in 3 months or sooner if necessary.   Murray Hodgkins, NP 04/14/2016, 11:22 AM

## 2016-04-14 NOTE — Patient Instructions (Signed)
Medication Instructions:  Your physician recommends that you continue on your current medications as directed. Please refer to the Current Medication list given to you today.  Labwork: Your physician recommends that you return for lab work in: [facility], BMET Your physician recommends that you return for lab work in: 6 weeks LIPID, LFT  Testing/Procedures: None Ordered  Follow-Up: Your physician recommends that you schedule a follow-up appointment in: 3 months with DR [name]   Any Other Special Instructions Will Be Listed Below (If Applicable).  NEXT APPOINTMENT   DATE:_______________________________  TIME:________________________________    If you need a refill on your cardiac medications before your next appointment, please call your pharmacy.

## 2016-04-15 ENCOUNTER — Telehealth: Payer: Self-pay | Admitting: Cardiovascular Disease

## 2016-04-15 NOTE — Telephone Encounter (Signed)
Pt aware of his lab results and verbalized understanding.

## 2016-04-15 NOTE — Telephone Encounter (Signed)
New Message  Pt voiced he's returning nurses call.  Please follow up with pt. Thanks!

## 2016-05-03 ENCOUNTER — Telehealth: Payer: Self-pay | Admitting: *Deleted

## 2016-05-03 NOTE — Telephone Encounter (Signed)
Pt walked in requesting rx refill for Eliquis 5mg .  Advised patient he should have refills at pharmacy-if not have pharmacy notify us for refill.

## 2016-07-20 ENCOUNTER — Ambulatory Visit: Payer: Medicare Other | Admitting: Cardiovascular Disease

## 2016-07-20 ENCOUNTER — Encounter: Payer: Self-pay | Admitting: Cardiovascular Disease

## 2016-07-20 ENCOUNTER — Ambulatory Visit (INDEPENDENT_AMBULATORY_CARE_PROVIDER_SITE_OTHER): Payer: Medicare Other | Admitting: Cardiovascular Disease

## 2016-07-20 VITALS — BP 134/77 | HR 60 | Ht 71.0 in | Wt 173.2 lb

## 2016-07-20 DIAGNOSIS — I2510 Atherosclerotic heart disease of native coronary artery without angina pectoris: Secondary | ICD-10-CM

## 2016-07-20 DIAGNOSIS — I480 Paroxysmal atrial fibrillation: Secondary | ICD-10-CM

## 2016-07-20 DIAGNOSIS — E7800 Pure hypercholesterolemia, unspecified: Secondary | ICD-10-CM | POA: Diagnosis not present

## 2016-07-20 DIAGNOSIS — R600 Localized edema: Secondary | ICD-10-CM

## 2016-07-20 DIAGNOSIS — I10 Essential (primary) hypertension: Secondary | ICD-10-CM

## 2016-07-20 DIAGNOSIS — Z7901 Long term (current) use of anticoagulants: Secondary | ICD-10-CM

## 2016-07-20 MED ORDER — FUROSEMIDE 20 MG PO TABS: 20.0000 mg | ORAL_TABLET | Freq: Every day | ORAL | 6 refills | Status: DC

## 2016-07-20 MED ORDER — ATORVASTATIN CALCIUM 40 MG PO TABS
40.0000 mg | ORAL_TABLET | Freq: Every day | ORAL | 3 refills | Status: DC
Start: 2016-07-20 — End: 2016-09-23

## 2016-07-20 NOTE — Patient Instructions (Signed)
Your physician has recommended you make the following change in your medication:   1.) start new prescription given today for furosemide 20 mg.  2.) the atorvastatin has been decreased to 40 mg daily.  These prescriptions has been sent to your pharmacy on file.  Your physician recommends that you return for lab work in: 1-2 weeks FASTING.  Your physician recommends that you schedule a follow-up appointment in: 3 months with Dr [name].

## 2016-07-22 NOTE — Progress Notes (Signed)
Cardiology Office Note    Date:  07/22/2016   ID:  Gregory [name], Gregory [name] [birth year], MRN [id number]  PCP:  [name], MD  Cardiologist:  Shelva Majestic, MD   Chief Complaint  Patient presents with  . Follow-up    3 months  . Shortness of Breath    when walking long distance.     History of Present Illness:  Gregory [name] is a 82 y.o. male presents to the office for initial office follow-up evaluation with me since his acute coronary syndrome in August 2017.  Gregory [name] is an 82 year old African-American gentleman who has a history of hypertension as well as a history of a prior nonhemorrhagic CVA and a remote history of GI bleeding.  He developed severe chest pain which persisted throughout the night and presented to [facility] hospital on 03/26/2016.  A code STEMI was activated.  When he presented with new right bundle branch block with slight ST elevation in V2 and V3.  Emergent catheterization by me revealed chronic calcification with 50% distal left main stenosis, a normal LAD, 75% ostial circumflex stenosis with 30% mid circumflex and 50% distal marginal stenosis, 30% proximal RCA with 60-70% distal stenosis proximal to the PDA takeoff and 50% distal PDA stenosis.  In initial medical therapy trial was recommended with aggressive medical management.  An echo Doppler study showed an EF of 55-60% with grade 1 diastolic dysfunction.  There was severe LVH, mild biatrial enlargement, and his myocardium had a speckled appearance suggesting the possibility of amyloid.  He was seen in the office by Gregory Sea, Gregory [name] on 04/14/2016.  He had developed PAF during his hospitalization and was started on eliquis prior to discharge.  Was maintaining sinus rhythm and was without anginal symptomatology.  The last several months, he denies recurrent episodes of chest pain or awareness of palpitations.  He admits to swelling in his right greater than left lower extremity.  He presents for follow-up  evaluation.    Past Medical History:  Diagnosis Date  . Arthritis   . CAD (coronary artery disease)    a. cath 03/2016: diffuse disease w/ 50% ostial LM stenosis, 75% ostial Cx, 50% 3rd Mrg, 65% distal RCA, and 50% RPDA. Medical management recommended.  Marland Kitchen GIB (gastrointestinal bleeding)    a. occurred in 2016, secondary to gastritis and diverticulosis  . Hypercholesteremia   . Hypertensive heart disease   . LVH (left ventricular hypertrophy)    a. 03/2016 Echo: EF 55-65%, no rwma, Gr1 DD, sev LVH, mildly dil RA/LA, small peric eff;  b. 03/2016 Cardiac MRI: sev LVH, mildly dil RV, possible RV thrombus, diffuse LGE involving RV and diff subendocardial LGE in LV. *No M spike on SPEP, nl immunofixation pattern - no clear evidence of cardiac amyloidosis.  . Murmur, cardiac   . PAF (paroxysmal atrial fibrillation) ([place])    a. new-onset 03/2016, placed on Eliquis (CHA2DS2VASc = 6).  . Poor appetite   . Stroke [facility])    a. nonhemorrhagic CVA in 2015    Past Surgical History:  Procedure Laterality Date  . CARDIAC CATHETERIZATION N/A 03/26/2016   Procedure: Left Heart Cath and Coronary Angiography;  Surgeon: Troy Sine, MD;  Location: [place] [facility];  Service: Cardiovascular;  Laterality: N/A;  . COLONOSCOPY WITH PROPOFOL N/A 10/04/2014   Procedure: COLONOSCOPY WITH PROPOFOL;  Surgeon: Laurence Spates, MD;  Location: [place];  Service: Endoscopy;  Laterality: N/A;  . ESOPHAGOGASTRODUODENOSCOPY N/A 10/03/2014   Procedure: ESOPHAGOGASTRODUODENOSCOPY (EGD);  Surgeon: Winfield Cunas., MD;  Location: [facility] ENDOSCOPY;  Service: Endoscopy;  Laterality: N/A;    Current Medications: Outpatient Medications Prior to Visit  Medication Sig Dispense Refill  . amLODipine (NORVASC) 2.5 MG tablet Take 1 tablet by mouth daily.    Marland Kitchen apixaban (ELIQUIS) 5 MG TABS tablet Take 1 tablet (5 mg total) by mouth 2 (two) times daily. 60 tablet 10  . aspirin EC 81 MG tablet Take 81 mg by mouth daily.    .  brimonidine (ALPHAGAN) 0.2 % ophthalmic solution Place 1 drop into both eyes at bedtime.    . isosorbide mononitrate (IMDUR) 30 MG 24 hr tablet Take 1 tablet (30 mg total) by mouth daily. 30 tablet 6  . losartan (COZAAR) 25 MG tablet Take 1 tablet (25 mg total) by mouth daily. 30 tablet 6  . metoprolol (LOPRESSOR) 50 MG tablet Take 1 tablet (50 mg total) by mouth 2 (two) times daily. 60 tablet 6  . atorvastatin (LIPITOR) 80 MG tablet Take 1 tablet (80 mg total) by mouth daily. 30 tablet 6   No facility-administered medications prior to visit.      Allergies:   Sulfa antibiotics; Ramipril; Ramipril; and Sulfamethoxazole   Social History   Social History  . Marital status: Divorced    Spouse name: N/A  . Number of children: N/A  . Years of education: N/A   Social History Main Topics  . Smoking status: Never Smoker  . Smokeless tobacco: Never Used  . Alcohol use No  . Drug use: No  . Sexual activity: Yes   Other Topics Concern  . None   Social History Narrative  . None     Family History:  The patient's family history includes Hypertension in his mother.   ROS General: Negative; No fevers, chills, or night sweats;  HEENT: Negative; No changes in vision or hearing, sinus congestion, difficulty swallowing Pulmonary: Negative; No cough, wheezing, shortness of breath, hemoptysis Cardiovascular: See history of present illness Positive for lower extremity swelling GI: Negative; No nausea, vomiting, diarrhea, or abdominal pain GU: Negative; No dysuria, hematuria, or difficulty voiding Musculoskeletal: Negative; no myalgias, joint pain, or weakness Hematologic/Oncology: Negative; no easy bruising, bleeding Endocrine: Negative; no heat/cold intolerance; no diabetes Neuro: Negative; no changes in balance, headaches Skin: Negative; No rashes or skin lesions Psychiatric: Negative; No behavioral problems, depression Sleep: Negative; No snoring, daytime sleepiness, hypersomnolence,  bruxism, restless legs, hypnogognic hallucinations, no cataplexy Other comprehensive 14 point system review is negative.   PHYSICAL EXAM:   VS:  BP 134/77   Pulse 60   Ht '5\' 11"'  (1.803 m)   Wt 173 lb 3.2 oz (78.6 kg)   BMI 24.16 kg/m     Wt Readings from Last 3 Encounters:  07/20/16 173 lb 3.2 oz (78.6 kg)  04/14/16 172 lb 9.6 oz (78.3 kg)  03/26/16 173 lb 1 oz (78.5 kg)    General: Alert, oriented, no distress.  Skin: normal turgor, no rashes, warm and dry HEENT: Normocephalic, atraumatic. Pupils equal round and reactive to light; sclera anicteric; extraocular muscles intact; Fundi Disc flat, without hemorrhages or exudates. Nose without nasal septal hypertrophy Mouth/Parynx benign; Mallinpatti scale 3 Neck: No JVD, no carotid bruits; normal carotid upstroke Lungs: clear to ausculatation and percussion; no wheezing or rales Chest wall: without tenderness to palpitation Heart: PMI not displaced, RRR, s1 s2 normal, 1/6 systolic murmur, no diastolic murmur, no rubs, gallops, thrills, or heaves Abdomen: soft, nontender; no hepatosplenomehaly, BS+; abdominal aorta nontender  and not dilated by palpation. Back: no CVA tenderness Pulses 2+ Musculoskeletal: full range of motion, normal strength, no joint deformities Extremities: 2+ edema in the right lower extremity and 1+ edema in the left lower extremity.  no clubbing cyanosis, Homan's sign negative  Neurologic: grossly nonfocal; Cranial nerves grossly wnl Psychologic: Normal mood and affect   Studies/Labs Reviewed:   ECG (independently read by me): Sinus rhythm with first-degree AV block.  Right bundle branch block with repolarization changes.  Recent Labs: BMP Latest Ref Rng & Units 04/14/2016 03/30/2016 03/29/2016  Glucose 65 - 99 mg/dL 82 109(H) 102(H)  BUN 7 - 25 mg/dL 13 21(H) 19  Creatinine 0.70 - 1.11 mg/dL 0.84 1.17 0.93  Sodium 135 - 146 mmol/L 137 135 129(L)  Potassium 3.5 - 5.3 mmol/L 4.5 4.1 3.9  Chloride 98 - 110  mmol/L 105 104 102  CO2 20 - 31 mmol/L 25 24 21(L)  Calcium 8.6 - 10.3 mg/dL 8.9 8.6(L) 8.2(L)     Hepatic Function Latest Ref Rng & Units 03/30/2016 03/26/2016 10/02/2014  Total Protein 6.5 - 8.1 g/dL 5.8(L) 6.8 6.3  Albumin 3.5 - 5.0 g/dL 2.7(L) 4.0 3.9  AST 15 - 41 U/L 42(H) 62(H) 33  ALT 17 - 63 U/L '31 30 15  ' Alk Phosphatase 38 - 126 U/L 63 84 69  Total Bilirubin 0.3 - 1.2 mg/dL 1.5(H) 3.7(H) 1.5(H)    CBC Latest Ref Rng & Units 04/14/2016 03/30/2016 03/28/2016  WBC 3.8 - 10.8 K/uL 6.8 9.5 11.3(H)  Hemoglobin 13.2 - 17.1 g/dL 13.7 13.1 12.8(L)  Hematocrit 38.5 - 50.0 % 41.4 38.9(L) 39.8  Platelets 140 - 400 K/uL 196 136(L) 100(L)   Lab Results  Component Value Date   MCV 88.5 04/14/2016   MCV 87.6 03/30/2016   MCV 90.2 03/28/2016   Lab Results  Component Value Date   TSH 3.848 03/30/2016   Lab Results  Component Value Date   HGBA1C 5.6 03/26/2016     BNP No results found for: BNP  ProBNP No results found for: PROBNP   Lipid Panel     Component Value Date/Time   CHOL 140 03/26/2016 0815   TRIG 39 03/26/2016 0815   HDL 67 03/26/2016 0815   CHOLHDL 2.1 03/26/2016 0815   VLDL 8 03/26/2016 0815   LDLCALC 65 03/26/2016 0815     RADIOLOGY: No results found.   Additional studies/ records that were reviewed today include:  I reviewed the patient's hospitalization, catheterization study, and subsequent office notes.    ASSESSMENT:    1. Coronary artery disease involving native coronary artery of native heart without angina pectoris   2. Essential hypertension   3. PAF (paroxysmal atrial fibrillation) ([place])   4. Hypercholesteremia   5. Anticoagulation adequate   6. Bilateral lower extremity edema      PLAN:  Gregory [name] is an 82 year old Afro-American male who developed an acute coronary syndrome on 03/26/2016 and underwent emergent cardiac catheterization.  He was found to have hyperdynamic function without focal segmental wall motion  abnormalities.  Add diffuse stenoses as noted above.  He developed atrial fibrillation during his hospitalization and was started on anticoagulation.  He has not had any anginal symptomatology on his medical regimen consisting of amlodipine 2.5 mg, isosorbide 30 mg, losartan 25 mg, and metoprolol 50 mg twice a day.  On this regimen, his blood pressure today is stable.  However, he has 2+ edema in his right lower extremity and 1+ in his left lower extremity.  I am adding furosemide 20 mg daily to his medical regimen.  His hospitalization.  He was started on [employer].  She stent therapy with atorvastatin 80 mg but since going home he was having difficulty swallowing.  This large pill and as result has been taking only 40 mg by cutting the pill in half.  He has tolerated this well and denies myalgias.  He subsequently lipid study on 03/26/2016 showed an LDL of 65, triglycerides 39, total cholesterol 140, an HDL of 67.  He is maintaining sinus rhythm and is not had recurrent atrial fibrillation.  He is adequately anticoagulated on eliquis.  Return in several weeks for fasting laboratory.  Adjustments to his medical regimen will be made if necessary.  I will see him in 3 months for cardiology reevaluation.   Medication Adjustments/Labs and Tests Ordered: Current medicines are reviewed at length with the patient today.  Concerns regarding medicines are outlined above.  Medication changes, Labs and Tests ordered today are listed in the Patient Instructions below. Patient Instructions  Your physician has recommended you make the following change in your medication:   1.) start new prescription given today for furosemide 20 mg.  2.) the atorvastatin has been decreased to 40 mg daily.  These prescriptions has been sent to your pharmacy on file.  Your physician recommends that you return for lab work in: 1-2 weeks FASTING.  Your physician recommends that you schedule a follow-up appointment in: 3 months with  Dr [name].       Signed, Shelva Majestic, MD  07/22/2016 5:35 PM    [facility] [address], [place], [place], [place]  [address] Phone: [phone]

## 2016-08-05 ENCOUNTER — Inpatient Hospital Stay (HOSPITAL_COMMUNITY): Payer: Medicare Other

## 2016-08-05 ENCOUNTER — Encounter (HOSPITAL_COMMUNITY): Admission: EM | Disposition: A | Discharge status: Skilled Nursing Facility | Payer: Self-pay | Source: Home / Self Care

## 2016-08-05 ENCOUNTER — Encounter (HOSPITAL_COMMUNITY): Payer: Medicare Other

## 2016-08-05 ENCOUNTER — Ambulatory Visit: Payer: Self-pay | Admitting: Surgery

## 2016-08-05 ENCOUNTER — Inpatient Hospital Stay (HOSPITAL_COMMUNITY): Payer: Medicare Other | Admitting: Anesthesiology

## 2016-08-05 ENCOUNTER — Encounter (HOSPITAL_COMMUNITY): Payer: Self-pay

## 2016-08-05 ENCOUNTER — Inpatient Hospital Stay (HOSPITAL_COMMUNITY)
Admission: EM | Admit: 2016-08-05 | Discharge: 2016-08-22 | DRG: 344 | Disposition: A | Discharge status: Skilled Nursing Facility | Payer: Medicare Other | Attending: Internal Medicine | Admitting: Internal Medicine

## 2016-08-05 ENCOUNTER — Emergency Department (HOSPITAL_COMMUNITY): Payer: Medicare Other

## 2016-08-05 DIAGNOSIS — I10 Essential (primary) hypertension: Secondary | ICD-10-CM | POA: Diagnosis present

## 2016-08-05 DIAGNOSIS — I5033 Acute on chronic diastolic (congestive) heart failure: Secondary | ICD-10-CM | POA: Diagnosis present

## 2016-08-05 DIAGNOSIS — I471 Supraventricular tachycardia: Secondary | ICD-10-CM | POA: Diagnosis not present

## 2016-08-05 DIAGNOSIS — I252 Old myocardial infarction: Secondary | ICD-10-CM

## 2016-08-05 DIAGNOSIS — I110 Hypertensive heart disease with heart failure: Secondary | ICD-10-CM | POA: Diagnosis present

## 2016-08-05 DIAGNOSIS — M7989 Other specified soft tissue disorders: Secondary | ICD-10-CM

## 2016-08-05 DIAGNOSIS — R34 Anuria and oliguria: Secondary | ICD-10-CM | POA: Diagnosis not present

## 2016-08-05 DIAGNOSIS — E854 Organ-limited amyloidosis: Secondary | ICD-10-CM | POA: Diagnosis present

## 2016-08-05 DIAGNOSIS — I2510 Atherosclerotic heart disease of native coronary artery without angina pectoris: Secondary | ICD-10-CM | POA: Diagnosis present

## 2016-08-05 DIAGNOSIS — R52 Pain, unspecified: Secondary | ICD-10-CM

## 2016-08-05 DIAGNOSIS — Z79899 Other long term (current) drug therapy: Secondary | ICD-10-CM | POA: Diagnosis not present

## 2016-08-05 DIAGNOSIS — I480 Paroxysmal atrial fibrillation: Secondary | ICD-10-CM | POA: Diagnosis present

## 2016-08-05 DIAGNOSIS — M109 Gout, unspecified: Secondary | ICD-10-CM | POA: Diagnosis present

## 2016-08-05 DIAGNOSIS — K4030 Unilateral inguinal hernia, with obstruction, without gangrene, not specified as recurrent: Secondary | ICD-10-CM | POA: Diagnosis present

## 2016-08-05 DIAGNOSIS — Z6820 Body mass index (BMI) 20.0-20.9, adult: Secondary | ICD-10-CM | POA: Diagnosis not present

## 2016-08-05 DIAGNOSIS — I5032 Chronic diastolic (congestive) heart failure: Secondary | ICD-10-CM | POA: Diagnosis not present

## 2016-08-05 DIAGNOSIS — I25118 Atherosclerotic heart disease of native coronary artery with other forms of angina pectoris: Secondary | ICD-10-CM | POA: Diagnosis not present

## 2016-08-05 DIAGNOSIS — E7800 Pure hypercholesterolemia, unspecified: Secondary | ICD-10-CM | POA: Diagnosis present

## 2016-08-05 DIAGNOSIS — E785 Hyperlipidemia, unspecified: Secondary | ICD-10-CM | POA: Diagnosis present

## 2016-08-05 DIAGNOSIS — I4510 Unspecified right bundle-branch block: Secondary | ICD-10-CM | POA: Diagnosis present

## 2016-08-05 DIAGNOSIS — I959 Hypotension, unspecified: Secondary | ICD-10-CM | POA: Diagnosis present

## 2016-08-05 DIAGNOSIS — Z8673 Personal history of transient ischemic attack (TIA), and cerebral infarction without residual deficits: Secondary | ICD-10-CM

## 2016-08-05 DIAGNOSIS — E784 Other hyperlipidemia: Secondary | ICD-10-CM

## 2016-08-05 DIAGNOSIS — I319 Disease of pericardium, unspecified: Secondary | ICD-10-CM | POA: Diagnosis not present

## 2016-08-05 DIAGNOSIS — R262 Difficulty in walking, not elsewhere classified: Secondary | ICD-10-CM | POA: Diagnosis not present

## 2016-08-05 DIAGNOSIS — K921 Melena: Secondary | ICD-10-CM | POA: Diagnosis not present

## 2016-08-05 DIAGNOSIS — Z01810 Encounter for preprocedural cardiovascular examination: Secondary | ICD-10-CM

## 2016-08-05 DIAGNOSIS — E440 Moderate protein-calorie malnutrition: Secondary | ICD-10-CM | POA: Insufficient documentation

## 2016-08-05 DIAGNOSIS — I313 Pericardial effusion (noninflammatory): Secondary | ICD-10-CM | POA: Diagnosis present

## 2016-08-05 DIAGNOSIS — R627 Adult failure to thrive: Secondary | ICD-10-CM | POA: Diagnosis present

## 2016-08-05 DIAGNOSIS — D62 Acute posthemorrhagic anemia: Secondary | ICD-10-CM | POA: Diagnosis not present

## 2016-08-05 DIAGNOSIS — I43 Cardiomyopathy in diseases classified elsewhere: Secondary | ICD-10-CM | POA: Diagnosis present

## 2016-08-05 DIAGNOSIS — I314 Cardiac tamponade: Secondary | ICD-10-CM | POA: Diagnosis present

## 2016-08-05 DIAGNOSIS — E876 Hypokalemia: Secondary | ICD-10-CM

## 2016-08-05 DIAGNOSIS — R112 Nausea with vomiting, unspecified: Secondary | ICD-10-CM | POA: Diagnosis present

## 2016-08-05 DIAGNOSIS — I481 Persistent atrial fibrillation: Secondary | ICD-10-CM | POA: Diagnosis present

## 2016-08-05 DIAGNOSIS — I3139 Other pericardial effusion (noninflammatory): Secondary | ICD-10-CM | POA: Diagnosis present

## 2016-08-05 DIAGNOSIS — Z7901 Long term (current) use of anticoagulants: Secondary | ICD-10-CM

## 2016-08-05 DIAGNOSIS — I4819 Other persistent atrial fibrillation: Secondary | ICD-10-CM

## 2016-08-05 DIAGNOSIS — Z452 Encounter for adjustment and management of vascular access device: Secondary | ICD-10-CM

## 2016-08-05 HISTORY — DX: Personal history of peptic ulcer disease: Z87.11

## 2016-08-05 HISTORY — DX: Personal history of other diseases of the musculoskeletal system and connective tissue: Z87.39

## 2016-08-05 HISTORY — PX: LAPAROSCOPY: SHX197

## 2016-08-05 HISTORY — PX: LAPAROTOMY: SHX154

## 2016-08-05 HISTORY — PX: DIAGNOSTIC LAPAROSCOPY: SUR761

## 2016-08-05 HISTORY — DX: Malignant neoplasm of prostate: C61

## 2016-08-05 HISTORY — DX: Personal history of other diseases of the digestive system: Z87.19

## 2016-08-05 HISTORY — DX: ST elevation (STEMI) myocardial infarction of unspecified site: I21.3

## 2016-08-05 HISTORY — DX: Pneumonia, unspecified organism: J18.9

## 2016-08-05 LAB — CBC
HCT: 38 % — ABNORMAL LOW (ref 38.5–50.0)
HEMATOCRIT: 36.2 % — AB (ref 39.0–52.0)
HEMOGLOBIN: 11.4 g/dL — AB (ref 13.2–17.1)
HEMOGLOBIN: 11.9 g/dL — AB (ref 13.0–17.0)
MCH: 28.4 pg (ref 27.0–33.0)
MCH: 28.7 pg (ref 26.0–34.0)
MCHC: 30 g/dL — AB (ref 32.0–36.0)
MCHC: 32.9 g/dL (ref 30.0–36.0)
MCV: 94.5 fL (ref 80.0–100.0)
MCV: 87.4 fL (ref 78.0–100.0)
Platelets: 148 10*3/uL (ref 140–400)
Platelets: 128 10*3/uL — ABNORMAL LOW (ref 150–400)
RBC: 4.02 MIL/uL — AB (ref 4.20–5.80)
RBC: 4.14 MIL/uL — ABNORMAL LOW (ref 4.22–5.81)
RDW: 16.3 % — ABNORMAL HIGH (ref 11.0–15.0)
RDW: 15.6 % — ABNORMAL HIGH (ref 11.5–15.5)
WBC: 4.7 10*3/uL (ref 3.8–10.8)
WBC: 8.8 10*3/uL (ref 4.0–10.5)

## 2016-08-05 LAB — LIPID PANEL
CHOL/HDL RATIO: 1.8 ratio (ref <5.0)
CHOLESTEROL: 85 mg/dL (ref <200)
HDL: 46 mg/dL (ref >40)
LDL CALC: 33 mg/dL (ref <100)
TRIGLYCERIDES: 28 mg/dL (ref <150)
VLDL: 6 mg/dL (ref <30)

## 2016-08-05 LAB — COMPREHENSIVE METABOLIC PANEL
ALBUMIN: 3.8 g/dL (ref 3.6–5.1)
ALT: 15 U/L (ref 9–46)
ALT: 22 U/L (ref 17–63)
ANION GAP: 12 (ref 5–15)
AST: 29 U/L (ref 10–35)
AST: 36 U/L (ref 15–41)
Albumin: 4 g/dL (ref 3.5–5.0)
Alkaline Phosphatase: 81 U/L (ref 40–115)
Alkaline Phosphatase: 87 U/L (ref 38–126)
BUN: 18 mg/dL (ref 7–25)
BUN: 12 mg/dL (ref 6–20)
CHLORIDE: 107 mmol/L (ref 98–110)
CHLORIDE: 107 mmol/L (ref 101–111)
CO2: 27 mmol/L (ref 20–31)
CO2: 21 mmol/L — ABNORMAL LOW (ref 22–32)
CREATININE: 0.67 mg/dL — AB (ref 0.70–1.11)
Calcium: 9.1 mg/dL (ref 8.6–10.3)
Calcium: 9.7 mg/dL (ref 8.9–10.3)
Creatinine, Ser: 0.79 mg/dL (ref 0.61–1.24)
Glucose, Bld: 90 mg/dL (ref 65–99)
Glucose, Bld: 159 mg/dL — ABNORMAL HIGH (ref 65–99)
POTASSIUM: 4.3 mmol/L (ref 3.5–5.3)
POTASSIUM: 3.3 mmol/L — AB (ref 3.5–5.1)
SODIUM: 142 mmol/L (ref 135–146)
Sodium: 140 mmol/L (ref 135–145)
TOTAL PROTEIN: 6.5 g/dL (ref 6.1–8.1)
Total Bilirubin: 3 mg/dL — ABNORMAL HIGH (ref 0.2–1.2)
Total Bilirubin: 4 mg/dL — ABNORMAL HIGH (ref 0.3–1.2)
Total Protein: 7.1 g/dL (ref 6.5–8.1)

## 2016-08-05 LAB — I-STAT TROPONIN, ED: Troponin i, poc: 0.02 ng/mL (ref 0.00–0.08)

## 2016-08-05 LAB — PROTIME-INR
INR: 1.37
Prothrombin Time: 17 seconds — ABNORMAL HIGH (ref 11.4–15.2)

## 2016-08-05 LAB — URINALYSIS, ROUTINE W REFLEX MICROSCOPIC
Bilirubin Urine: NEGATIVE
GLUCOSE, UA: 50 mg/dL — AB
Hgb urine dipstick: NEGATIVE
Ketones, ur: 20 mg/dL — AB
LEUKOCYTES UA: NEGATIVE
Nitrite: NEGATIVE
PH: 6 (ref 5.0–8.0)
Protein, ur: NEGATIVE mg/dL
Specific Gravity, Urine: 1.044 — ABNORMAL HIGH (ref 1.005–1.030)

## 2016-08-05 LAB — TSH: TSH: 1.74 mIU/L (ref 0.40–4.50)

## 2016-08-05 LAB — APTT: APTT: 33 s (ref 24–36)

## 2016-08-05 LAB — LIPASE, BLOOD: LIPASE: 27 U/L (ref 11–51)

## 2016-08-05 SURGERY — LAPAROSCOPY, DIAGNOSTIC
Anesthesia: General | Site: Abdomen

## 2016-08-05 MED ORDER — SUCCINYLCHOLINE CHLORIDE 20 MG/ML IJ SOLN
INTRAMUSCULAR | Status: DC | PRN
  Administered 2016-08-05: 80 mg via INTRAVENOUS

## 2016-08-05 MED ORDER — ACETAMINOPHEN 650 MG RE SUPP: 650.0000 mg | Freq: Four times a day (QID) | RECTAL | Status: DC | PRN

## 2016-08-05 MED ORDER — DIPHENHYDRAMINE HCL 25 MG PO CAPS
25.0000 mg | ORAL_CAPSULE | Freq: Four times a day (QID) | ORAL | Status: DC | PRN
  Filled 2016-08-05: qty 1

## 2016-08-05 MED ORDER — DIPHENHYDRAMINE HCL 50 MG/ML IJ SOLN: 25.0000 mg | Freq: Four times a day (QID) | INTRAMUSCULAR | Status: DC | PRN

## 2016-08-05 MED ORDER — ATORVASTATIN CALCIUM 40 MG PO TABS
40.0000 mg | ORAL_TABLET | Freq: Every day | ORAL | Status: DC
  Administered 2016-08-06 – 2016-08-22 (×17): 40 mg via ORAL
  Filled 2016-08-05 (×17): qty 1

## 2016-08-05 MED ORDER — ASPIRIN EC 81 MG PO TBEC
81.0000 mg | DELAYED_RELEASE_TABLET | Freq: Every day | ORAL | Status: DC
  Administered 2016-08-06 – 2016-08-17 (×12): 81 mg via ORAL
  Filled 2016-08-05 (×12): qty 1

## 2016-08-05 MED ORDER — AMLODIPINE BESYLATE 2.5 MG PO TABS: 2.5000 mg | ORAL_TABLET | Freq: Every day | ORAL | Status: DC

## 2016-08-05 MED ORDER — 0.9 % SODIUM CHLORIDE (POUR BTL) OPTIME
TOPICAL | Status: DC | PRN
  Administered 2016-08-05 (×3): 1000 mL

## 2016-08-05 MED ORDER — GLYCOPYRROLATE 0.2 MG/ML IJ SOLN
INTRAMUSCULAR | Status: DC | PRN
  Administered 2016-08-05: 0.6 mg via INTRAVENOUS

## 2016-08-05 MED ORDER — SODIUM CHLORIDE 0.9 % IV BOLUS (SEPSIS)
1000.0000 mL | Freq: Once | INTRAVENOUS | Status: AC
  Administered 2016-08-05: 1000 mL via INTRAVENOUS

## 2016-08-05 MED ORDER — ISOSORBIDE MONONITRATE ER 30 MG PO TB24: 30.0000 mg | ORAL_TABLET | Freq: Every day | ORAL | Status: DC

## 2016-08-05 MED ORDER — SODIUM CHLORIDE 0.9 % IR SOLN
Status: DC | PRN
  Administered 2016-08-05: 1000 mL

## 2016-08-05 MED ORDER — PANTOPRAZOLE SODIUM 40 MG IV SOLR
40.0000 mg | Freq: Every day | INTRAVENOUS | Status: DC
  Administered 2016-08-05 – 2016-08-08 (×4): 40 mg via INTRAVENOUS
  Filled 2016-08-05 (×4): qty 40

## 2016-08-05 MED ORDER — METRONIDAZOLE IN NACL 5-0.79 MG/ML-% IV SOLN
500.0000 mg | INTRAVENOUS | Status: AC
  Administered 2016-08-05: 500 mg via INTRAVENOUS
  Filled 2016-08-05: qty 100

## 2016-08-05 MED ORDER — DILTIAZEM HCL 25 MG/5ML IV SOLN
10.0000 mg | Freq: Four times a day (QID) | INTRAVENOUS | Status: DC | PRN
  Administered 2016-08-05: 10 mg via INTRAVENOUS
  Filled 2016-08-05 (×2): qty 5

## 2016-08-05 MED ORDER — CHLORHEXIDINE GLUCONATE CLOTH 2 % EX PADS: 6.0000 | MEDICATED_PAD | Freq: Once | CUTANEOUS | Status: DC

## 2016-08-05 MED ORDER — METOPROLOL TARTRATE 5 MG/5ML IV SOLN
INTRAVENOUS | Status: DC | PRN
  Administered 2016-08-05: 2.5 mg via INTRAVENOUS

## 2016-08-05 MED ORDER — FENTANYL CITRATE (PF) 100 MCG/2ML IJ SOLN
50.0000 ug | Freq: Once | INTRAMUSCULAR | Status: AC
  Administered 2016-08-05: 50 ug via INTRAVENOUS
  Filled 2016-08-05: qty 2

## 2016-08-05 MED ORDER — PHENYLEPHRINE HCL 10 MG/ML IJ SOLN
INTRAMUSCULAR | Status: DC | PRN
  Administered 2016-08-05: 80 ug via INTRAVENOUS

## 2016-08-05 MED ORDER — FENTANYL CITRATE (PF) 100 MCG/2ML IJ SOLN
INTRAMUSCULAR | Status: AC
  Filled 2016-08-05: qty 2

## 2016-08-05 MED ORDER — ACETAMINOPHEN 325 MG PO TABS
650.0000 mg | ORAL_TABLET | Freq: Four times a day (QID) | ORAL | Status: DC | PRN
  Administered 2016-08-07 – 2016-08-08 (×2): 650 mg via ORAL
  Filled 2016-08-05 (×2): qty 2

## 2016-08-05 MED ORDER — ONDANSETRON HCL 4 MG/2ML IJ SOLN
INTRAMUSCULAR | Status: DC | PRN
  Administered 2016-08-05 (×2): 4 mg via INTRAVENOUS

## 2016-08-05 MED ORDER — LACTATED RINGERS IV SOLN
INTRAVENOUS | Status: DC
  Administered 2016-08-05 (×2): via INTRAVENOUS

## 2016-08-05 MED ORDER — BUPIVACAINE-EPINEPHRINE (PF) 0.25% -1:200000 IJ SOLN
INTRAMUSCULAR | Status: AC
  Filled 2016-08-05: qty 30

## 2016-08-05 MED ORDER — FENTANYL CITRATE (PF) 100 MCG/2ML IJ SOLN
INTRAMUSCULAR | Status: AC
  Filled 2016-08-05: qty 4

## 2016-08-05 MED ORDER — FUROSEMIDE 20 MG PO TABS: 20.0000 mg | ORAL_TABLET | Freq: Every day | ORAL | Status: DC

## 2016-08-05 MED ORDER — IOPAMIDOL (ISOVUE-300) INJECTION 61%
INTRAVENOUS | Status: AC
  Administered 2016-08-05: 100 mL
  Filled 2016-08-05: qty 100

## 2016-08-05 MED ORDER — LOSARTAN POTASSIUM 50 MG PO TABS: 25.0000 mg | ORAL_TABLET | Freq: Every day | ORAL | Status: DC

## 2016-08-05 MED ORDER — SODIUM CHLORIDE 0.9 % IV SOLN
INTRAVENOUS | Status: DC
  Administered 2016-08-06: 05:00:00 via INTRAVENOUS

## 2016-08-05 MED ORDER — ROCURONIUM BROMIDE 100 MG/10ML IV SOLN
INTRAVENOUS | Status: DC | PRN
  Administered 2016-08-05: 50 mg via INTRAVENOUS

## 2016-08-05 MED ORDER — FENTANYL CITRATE (PF) 100 MCG/2ML IJ SOLN
50.0000 ug | INTRAMUSCULAR | Status: DC | PRN
  Administered 2016-08-05: 50 ug via INTRAVENOUS
  Filled 2016-08-05: qty 2

## 2016-08-05 MED ORDER — ONDANSETRON 4 MG PO TBDP
4.0000 mg | ORAL_TABLET | Freq: Four times a day (QID) | ORAL | Status: DC | PRN
  Filled 2016-08-05: qty 1

## 2016-08-05 MED ORDER — PHENYLEPHRINE HCL 10 MG/ML IJ SOLN
INTRAMUSCULAR | Status: DC | PRN
  Administered 2016-08-05: 20 ug/min via INTRAVENOUS

## 2016-08-05 MED ORDER — FENTANYL CITRATE (PF) 100 MCG/2ML IJ SOLN
INTRAMUSCULAR | Status: DC | PRN
  Administered 2016-08-05: 100 ug via INTRAVENOUS
  Administered 2016-08-05: 75 ug via INTRAVENOUS
  Administered 2016-08-05: 50 ug via INTRAVENOUS
  Administered 2016-08-05: 100 ug via INTRAVENOUS
  Administered 2016-08-05: 50 ug via INTRAVENOUS
  Administered 2016-08-05: 25 ug via INTRAVENOUS
  Administered 2016-08-05: 100 ug via INTRAVENOUS

## 2016-08-05 MED ORDER — ROCURONIUM BROMIDE 50 MG/5ML IV SOSY
PREFILLED_SYRINGE | INTRAVENOUS | Status: AC
  Filled 2016-08-05: qty 5

## 2016-08-05 MED ORDER — ONDANSETRON HCL 4 MG/2ML IJ SOLN
4.0000 mg | Freq: Four times a day (QID) | INTRAMUSCULAR | Status: DC | PRN
  Administered 2016-08-11 – 2016-08-20 (×4): 4 mg via INTRAVENOUS
  Filled 2016-08-05 (×4): qty 2

## 2016-08-05 MED ORDER — CEFAZOLIN SODIUM-DEXTROSE 2-4 GM/100ML-% IV SOLN
2.0000 g | INTRAVENOUS | Status: AC
  Administered 2016-08-05: 2 g via INTRAVENOUS

## 2016-08-05 MED ORDER — BUPIVACAINE-EPINEPHRINE 0.25% -1:200000 IJ SOLN
INTRAMUSCULAR | Status: DC | PRN
  Administered 2016-08-05: 20 mL

## 2016-08-05 MED ORDER — NEOSTIGMINE METHYLSULFATE 10 MG/10ML IV SOLN
INTRAVENOUS | Status: DC | PRN
  Administered 2016-08-05: 4 mg via INTRAVENOUS

## 2016-08-05 MED ORDER — PROPOFOL 10 MG/ML IV BOLUS
INTRAVENOUS | Status: AC
  Filled 2016-08-05: qty 20

## 2016-08-05 MED ORDER — HYDRALAZINE HCL 20 MG/ML IJ SOLN: 5.0000 mg | INTRAMUSCULAR | Status: DC | PRN

## 2016-08-05 MED ORDER — HYDROMORPHONE HCL 2 MG/ML IJ SOLN
0.5000 mg | INTRAMUSCULAR | Status: DC | PRN
  Administered 2016-08-05 – 2016-08-06 (×3): 0.5 mg via INTRAVENOUS
  Filled 2016-08-05 (×3): qty 1

## 2016-08-05 MED ORDER — ONDANSETRON 4 MG PO TBDP
4.0000 mg | ORAL_TABLET | Freq: Once | ORAL | Status: AC | PRN
  Administered 2016-08-05: 4 mg via ORAL

## 2016-08-05 MED ORDER — METOPROLOL TARTRATE 50 MG PO TABS
50.0000 mg | ORAL_TABLET | Freq: Two times a day (BID) | ORAL | Status: DC
  Administered 2016-08-06 – 2016-08-15 (×19): 50 mg via ORAL
  Filled 2016-08-05 (×19): qty 1

## 2016-08-05 MED ORDER — PROPOFOL 10 MG/ML IV BOLUS
INTRAVENOUS | Status: DC | PRN
  Administered 2016-08-05: 100 mg via INTRAVENOUS
  Administered 2016-08-05: 20 mg via INTRAVENOUS

## 2016-08-05 MED ORDER — FENTANYL CITRATE (PF) 100 MCG/2ML IJ SOLN
25.0000 ug | Freq: Once | INTRAMUSCULAR | Status: AC
  Administered 2016-08-05: 25 ug via INTRAVENOUS
  Filled 2016-08-05: qty 2

## 2016-08-05 MED ORDER — CEFAZOLIN SODIUM-DEXTROSE 2-4 GM/100ML-% IV SOLN
2.0000 g | INTRAVENOUS | Status: DC
  Filled 2016-08-05: qty 100

## 2016-08-05 MED ORDER — LIDOCAINE HCL (CARDIAC) 20 MG/ML IV SOLN
INTRAVENOUS | Status: DC | PRN
  Administered 2016-08-05: 60 mg via INTRATRACHEAL

## 2016-08-05 MED ORDER — ONDANSETRON 4 MG PO TBDP
ORAL_TABLET | ORAL | Status: AC
  Filled 2016-08-05: qty 1

## 2016-08-05 SURGICAL SUPPLY — 55 items
BLADE SURG 10 STRL SS (BLADE) ×1 IMPLANT
BLADE SURG ROTATE 9660 (MISCELLANEOUS) ×1 IMPLANT
CANISTER SUCTION 2500CC (MISCELLANEOUS) ×1 IMPLANT
CELLS DAT CNTRL 66122 CELL SVR (MISCELLANEOUS) ×1 IMPLANT
CHLORAPREP W/TINT 26ML (MISCELLANEOUS) ×1 IMPLANT
COVER SURGICAL LIGHT HANDLE (MISCELLANEOUS) ×1 IMPLANT
DEVICE SECURE STRAP 25 ABSORB (INSTRUMENTS) ×1 IMPLANT
DRAPE WARM FLUID 44X44 (DRAPE) ×1 IMPLANT
ELECT CAUTERY BLADE 6.4 (BLADE) ×1 IMPLANT
ELECT REM PT RETURN 9FT ADLT (ELECTROSURGICAL) ×2
ELECTRODE REM PT RTRN 9FT ADLT (ELECTROSURGICAL) IMPLANT
GAUZE SPONGE 2X2 8PLY STRL LF (GAUZE/BANDAGES/DRESSINGS) IMPLANT
GAUZE SPONGE 4X4 16PLY XRAY LF (GAUZE/BANDAGES/DRESSINGS) ×1 IMPLANT
GLOVE BIO SURGEON STRL SZ 6 (GLOVE) ×1 IMPLANT
GLOVE BIOGEL PI IND STRL 6.5 (GLOVE) IMPLANT
GLOVE BIOGEL PI IND STRL 7.0 (GLOVE) IMPLANT
GLOVE BIOGEL PI INDICATOR 6.5 (GLOVE) ×1
GLOVE BIOGEL PI INDICATOR 7.0 (GLOVE) ×1
GLOVE SURG SS PI 7.0 STRL IVOR (GLOVE) ×1 IMPLANT
GOWN STRL REUS W/ TWL LRG LVL3 (GOWN DISPOSABLE) IMPLANT
GOWN STRL REUS W/TWL LRG LVL3 (GOWN DISPOSABLE) ×4
HANDLE SUCTION POOLE (INSTRUMENTS) IMPLANT
KIT BASIN OR (CUSTOM PROCEDURE TRAY) ×1 IMPLANT
KIT ROOM TURNOVER OR (KITS) ×1 IMPLANT
MESH PHASIX RESORB ROUND 7.6CM (Mesh General) ×1 IMPLANT
NS IRRIG 1000ML POUR BTL (IV SOLUTION) ×3 IMPLANT
PAD ARMBOARD 7.5X6 YLW CONV (MISCELLANEOUS) ×2 IMPLANT
PENCIL BUTTON HOLSTER BLD 10FT (ELECTRODE) ×1 IMPLANT
RELOAD PROXIMATE 75MM BLUE (ENDOMECHANICALS) ×4 IMPLANT
RELOAD STAPLE 75 3.8 BLU REG (ENDOMECHANICALS) IMPLANT
RETRACTOR WND ALEXIS 18 MED (MISCELLANEOUS) IMPLANT
RTRCTR WOUND ALEXIS 18CM MED (MISCELLANEOUS) ×2
SCISSORS LAP 5X35 DISP (ENDOMECHANICALS) ×1 IMPLANT
SET IRRIG TUBING LAPAROSCOPIC (IRRIGATION / IRRIGATOR) ×1 IMPLANT
SLEEVE ENDOPATH XCEL 5M (ENDOMECHANICALS) ×2 IMPLANT
SPONGE GAUZE 2X2 STER 10/PKG (GAUZE/BANDAGES/DRESSINGS) ×1
SPONGE LAP 18X18 X RAY DECT (DISPOSABLE) ×2 IMPLANT
STAPLER GUN LINEAR PROX 60 (STAPLE) ×1 IMPLANT
STAPLER PROXIMATE 75MM BLUE (STAPLE) ×1 IMPLANT
STAPLER VISISTAT 35W (STAPLE) ×1 IMPLANT
SUCTION POOLE HANDLE (INSTRUMENTS) ×2
SUT SILK 2 0 TIES 10X30 (SUTURE) ×1 IMPLANT
SUT SILK 2 0SH CR/8 30 (SUTURE) ×1 IMPLANT
SUT SILK 3 0 TIES 10X30 (SUTURE) ×1 IMPLANT
SUT SILK 3 0SH CR/8 30 (SUTURE) ×1 IMPLANT
SUT VICRYL 0 UR6 27IN ABS (SUTURE) ×1 IMPLANT
TAPE CLOTH SURG 4X10 WHT LF (GAUZE/BANDAGES/DRESSINGS) ×1 IMPLANT
TOWEL OR 17X24 6PK STRL BLUE (TOWEL DISPOSABLE) ×1 IMPLANT
TRAY FOLEY CATH 16FRSI W/METER (SET/KITS/TRAYS/PACK) ×1 IMPLANT
TRAY LAPAROSCOPIC MC (CUSTOM PROCEDURE TRAY) ×1 IMPLANT
TROCAR BLADELESS 12MM (ENDOMECHANICALS) ×1 IMPLANT
TROCAR XCEL NON-BLD 5MMX100MML (ENDOMECHANICALS) ×1 IMPLANT
TUBE CONNECTING 12X1/4 (SUCTIONS) ×1 IMPLANT
TUBING INSUFFLATION (TUBING) ×1 IMPLANT
YANKAUER SUCT BULB TIP NO VENT (SUCTIONS) ×1 IMPLANT

## 2016-08-05 NOTE — Consult Note (Signed)
[facility] Surgery Consult/Admission Note  Gregory [name] 1930-04-03  [id number].    Requesting MD: Dr. Vanita Panda Chief Complaint/Reason for Consult: Possible incarcerated inguinal hernia  HPI:   PT is a 82 year old male with a history of HTN, nonhemorrhagic CVA, GI bleed, STEMI 03/2016, CHF, A. Fib on Eliquis (last dose yesterday morning) who presented to the [facility] ED with complaints of progressively worsening lower abdominal pain that started yesterday. He describes as constant, achy, non-radiating, 9/10 with associated nausea, vomiting, and chills. Pt states he has been having intermittent left lower pelvic pain for a few months. He has a history of RIH which he believes was repaired. He thinks he has had a left IH for over 20 years. He has felt a bulge on and off for years. Pt denies diarrhea, constipation, fever, blood in his stools, abdominal bloating, dysuria or hematuria, dizziness, syncope, no new or worsening SOB from baseline, denies CP. Pt states he has experiences lower leg swelling right > left for over a year. Last dose of Eliquis was yesterday morning  ED course: Left inguinal hernia containing a loop of small bowel with evidence of incarceration and proximal obstruction. Potassium 3.3, total bilirubin 4.0, Hg 11.9 Pt received a L of NS  ROS:  Review of Systems  Constitutional: Positive for chills. Negative for diaphoresis and fever.  HENT: Negative for congestion and sore throat.   Eyes: Negative.   Respiratory: Positive for shortness of breath. Negative for cough and sputum production.   Cardiovascular: Positive for leg swelling. Negative for chest pain and palpitations.  Gastrointestinal: Positive for abdominal pain, nausea and vomiting. Negative for blood in stool, constipation, diarrhea and melena.  Genitourinary: Negative for dysuria and hematuria.  Musculoskeletal: Negative for joint pain and myalgias.  Skin: Negative for rash.  Neurological: Negative for  dizziness, speech change, loss of consciousness and weakness.  All other systems reviewed and are negative.    Family History  Problem Relation Age of Onset  . Hypertension Mother     Past Medical History:  Diagnosis Date  . Arthritis   . CAD (coronary artery disease)    a. cath 03/2016: diffuse disease w/ 50% ostial LM stenosis, 75% ostial Cx, 50% 3rd Mrg, 65% distal RCA, and 50% RPDA. Medical management recommended.  Marland Kitchen GIB (gastrointestinal bleeding)    a. occurred in 2016, secondary to gastritis and diverticulosis  . Hypercholesteremia   . Hypertensive heart disease   . LVH (left ventricular hypertrophy)    a. 03/2016 Echo: EF 55-65%, no rwma, Gr1 DD, sev LVH, mildly dil RA/LA, small peric eff;  b. 03/2016 Cardiac MRI: sev LVH, mildly dil RV, possible RV thrombus, diffuse LGE involving RV and diff subendocardial LGE in LV. *No M spike on SPEP, nl immunofixation pattern - no clear evidence of cardiac amyloidosis.  . Murmur, cardiac   . PAF (paroxysmal atrial fibrillation) ([place])    a. new-onset 03/2016, placed on Eliquis (CHA2DS2VASc = 6).  . Poor appetite   . Stroke [facility])    a. nonhemorrhagic CVA in 2015    Past Surgical History:  Procedure Laterality Date  . CARDIAC CATHETERIZATION N/A 03/26/2016   Procedure: Left Heart Cath and Coronary Angiography;  Surgeon: Troy Sine, MD;  Location: [place] CV LAB;  Service: Cardiovascular;  Laterality: N/A;  . COLONOSCOPY WITH PROPOFOL N/A 10/04/2014   Procedure: COLONOSCOPY WITH PROPOFOL;  Surgeon: Laurence Spates, MD;  Location: [place];  Service: Endoscopy;  Laterality: N/A;  . ESOPHAGOGASTRODUODENOSCOPY N/A 10/03/2014  Procedure: ESOPHAGOGASTRODUODENOSCOPY (EGD);  Surgeon: Winfield Cunas., MD;  Location: [facility] ENDOSCOPY;  Service: Endoscopy;  Laterality: N/A;    Social History:  reports that he has never smoked. He has never used smokeless tobacco. He reports that he does not drink alcohol or use drugs.  Allergies:   Allergies  Allergen Reactions  . Sulfa Antibiotics   . Ramipril   . Ramipril Other (See Comments)  . Sulfamethoxazole Rash     (Not in a hospital admission)  Blood pressure 158/77, pulse 95, temperature 97.6 F (36.4 C), temperature source Oral, resp. rate 22, height '5\' 11"'  (1.803 m), weight 171 lb 4.8 oz (77.7 kg), SpO2 96 %.  Physical Exam: General: pleasant, WD/WN elderly, thin, AA male who is laying in bed in NAD HEENT: head is normocephalic, atraumatic.  Sclera are noninjected. bOral mucosa is pink and moist Heart: regular, rate, and rhythm.  Mild Systolic murmur noted, gallops, or rubs noted.  2+ radial pulses bilaterally Lungs: CTAB, no wheezes, rhonchi, or rales noted.  Respiratory effort nonlabored Abd: soft, ND, hypoactive BS, firm tender mass in the left groin, non reducible, moderately TTP, generalized abdominal tenderness MS: 2+ pitting edema of BLE right > left Skin: warm and dry with no rashes noted Psych: A&Ox3 with an appropriate affect. Neuro: CM grossly 2-12 intact, normal speech  Results for orders placed or performed during the hospital encounter of 08/05/16 (from the past 48 hour(s))  Lipase, blood     Status: None   Collection Time: 08/05/16  7:26 AM  Result Value Ref Range   Lipase 27 11 - 51 U/L  Comprehensive metabolic panel     Status: Abnormal   Collection Time: 08/05/16  7:26 AM  Result Value Ref Range   Sodium 140 135 - 145 mmol/L   Potassium 3.3 (L) 3.5 - 5.1 mmol/L   Chloride 107 101 - 111 mmol/L   CO2 21 (L) 22 - 32 mmol/L   Glucose, Bld 159 (H) 65 - 99 mg/dL   BUN 12 6 - 20 mg/dL   Creatinine, Ser 0.79 0.61 - 1.24 mg/dL   Calcium 9.7 8.9 - 10.3 mg/dL   Total Protein 7.1 6.5 - 8.1 g/dL   Albumin 4.0 3.5 - 5.0 g/dL   AST 36 15 - 41 U/L   ALT 22 17 - 63 U/L   Alkaline Phosphatase 87 38 - 126 U/L   Total Bilirubin 4.0 (H) 0.3 - 1.2 mg/dL   GFR calc non Af Amer >60 >60 mL/min   GFR calc Af Amer >60 >60 mL/min    Comment: (NOTE) The eGFR  has been calculated using the CKD EPI equation. This calculation has not been validated in all clinical situations. eGFR's persistently <60 mL/min signify possible Chronic Kidney Disease.    Anion gap 12 5 - 15  CBC     Status: Abnormal   Collection Time: 08/05/16  7:26 AM  Result Value Ref Range   WBC 8.8 4.0 - 10.5 K/uL   RBC 4.14 (L) 4.22 - 5.81 MIL/uL   Hemoglobin 11.9 (L) 13.0 - 17.0 g/dL   HCT 36.2 (L) 39.0 - 52.0 %   MCV 87.4 78.0 - 100.0 fL   MCH 28.7 26.0 - 34.0 pg   MCHC 32.9 30.0 - 36.0 g/dL   RDW 15.6 (H) 11.5 - 15.5 %   Platelets 128 (L) 150 - 400 K/uL  I-stat troponin, ED     Status: None   Collection Time: 08/05/16  7:46 AM  Result Value Ref Range   Troponin i, poc 0.02 0.00 - 0.08 ng/mL   Comment 3            Comment: Due to the release kinetics of cTnI, a negative result within the first hours of the onset of symptoms does not rule out myocardial infarction with certainty. If myocardial infarction is still suspected, repeat the test at appropriate intervals.    Ct Abdomen Pelvis W Contrast  Result Date: 08/05/2016 CLINICAL DATA:  Upper abdominal pain and nausea EXAM: CT ABDOMEN AND PELVIS WITH CONTRAST TECHNIQUE: Multidetector CT imaging of the abdomen and pelvis was performed using the standard protocol following bolus administration of intravenous contrast. CONTRAST:  172m ISOVUE-300 IOPAMIDOL (ISOVUE-300) INJECTION 61% COMPARISON:  03/30/2016. FINDINGS: Lower chest: Lung bases demonstrate some mild dependent atelectatic changes. A large pericardial effusion is identified which is increased in the interval from the prior MRI of 03/30/2016. It measures approximately 3.7 cm along the posterior wall of the left ventricle. Hepatobiliary: Mild early heterogeneity to the liver is noted related to the timing of the contrast bolus. Multiple gallstones are seen without complicating factors. Pancreas: Unremarkable. No pancreatic ductal dilatation or surrounding  inflammatory changes. Spleen: Normal in size without focal abnormality. Adrenals/Urinary Tract: Adrenal glands are unremarkable. Kidneys are normal, without renal calculi, focal lesion, or hydronephrosis. Bladder is decompressed. Stomach/Bowel: Scattered diverticular changes noted without evidence of diverticulitis. The appendix is not well visualized although no inflammatory changes to suggest appendicitis are seen. There is evidence of small bowel dilatation secondary to a left inguinal hernia. A loop of small bowel extends into the hernia and is within normal limits distal to this although obstructed proximally. Vascular/Lymphatic: Aortic atherosclerosis. No enlarged abdominal or pelvic lymph nodes. Reproductive: Prostate appears to have been surgically removed. Other: No abdominal wall hernia or abnormality. No abdominopelvic ascites. Musculoskeletal: No acute or significant osseous findings. IMPRESSION: Left inguinal hernia containing a loop of small bowel with evidence of incarceration and proximal obstruction. Enlarging pericardial effusion as described above. Cholelithiasis without complicating factors. Diverticulosis without diverticulitis. Electronically Signed   By: MInez CatalinaM.D.   On: 08/05/2016 09:29      Assessment/Plan  Incarcerated Left Inguinal Hernia with proximal SBO - unable to be reduced bedside - CT scan Left inguinal hernia containing a loop of small bowel with evidence of incarceration and proximal obstruction. - NPO, NGT - Hold Eliquis - STAT coags revealed INR of 1.37  HTN Stroke GI bleed STEMI 03/2016 CHF A. Fib on Eliquis (last dose yesterday morning)  - comorbidities being managed by internal medicine. We greatly appreciate your assistance with the care of this patient.   Plan: Pt will go to the OR this afternoon for laparoscopic vs open laparotomy reduction of incarcerated left inguinal hernia with possible small bowel resection and repair of hernia. Cards  consult pending prior to surgery. NPO, NGT, hold Elquis. Internal medicine managing other health issues and their help is greatly appreciated.  Spoke with pharmacy who stated the half life of Eliquis is 12 hrs and it is reasonable to perform surgery after 3 half lives but drug is usually out of ones system after 5 half lives.     JKalman Drape [facility] 08/05/2016, 10:48 AM Pager: [phone]: [phone]-Fri 7:00 am-4:30 pm Sat-Sun 7:00 am-11:30 am

## 2016-08-05 NOTE — ED Notes (Signed)
Niece, Magda Paganini has pt's house key, please call her if pt is discharged.  [phone].

## 2016-08-05 NOTE — Anesthesia Preprocedure Evaluation (Signed)
Anesthesia Evaluation  Patient identified by MRN, date of birth, ID band Patient awake    Reviewed: Allergy & Precautions, NPO status , Patient's Chart, lab work & pertinent test results, reviewed documented beta blocker date and time   History of Anesthesia Complications Negative for: history of anesthetic complications  Airway Mallampati: II  TM Distance: >3 FB Neck ROM: Full    Dental  (+) Poor Dentition, Chipped, Missing   Pulmonary neg pulmonary ROS,    breath sounds clear to auscultation       Cardiovascular hypertension, Pt. on medications and Pt. on home beta blockers (-) angina+ CAD, + Past MI and +CHF  + dysrhythmias Atrial Fibrillation + Valvular Problems/Murmurs  Rhythm:Regular     Neuro/Psych CVA negative psych ROS   GI/Hepatic Neg liver ROS, Incarcerated inguinal hernia    Endo/Other  negative endocrine ROS  Renal/GU negative Renal ROS     Musculoskeletal  (+) Arthritis ,   Abdominal   Peds  Hematology  (+) anemia ,   Anesthesia Other Findings   Reproductive/Obstetrics                             Anesthesia Physical Anesthesia Plan  ASA: III and emergent  Anesthesia Plan: General   Post-op Pain Management:    Induction: Intravenous  Airway Management Planned: Oral ETT  Additional Equipment: None  Intra-op Plan:   Post-operative Plan: Extubation in OR  Informed Consent: I have reviewed the patients History and Physical, chart, labs and discussed the procedure including the risks, benefits and alternatives for the proposed anesthesia with the patient or authorized representative who has indicated his/her understanding and acceptance.   Dental advisory given  Plan Discussed with: CRNA and Surgeon  Anesthesia Plan Comments:         Anesthesia Quick Evaluation

## 2016-08-05 NOTE — ED Notes (Signed)
Pt called out for pain medicine x2 -- Admitting MD ordering pain meds.

## 2016-08-05 NOTE — Consult Note (Addendum)
Medical Consultation   Gregory [name]  [id number]  DOB: Jan 05, 1930  DOA: 08/05/2016  PCP: [name], MD   Outpatient Specialists: Cardiology, ophthalmology, vascular surgery   Requesting physician: Dr. Kae Heller   Reason for consultation: Medical management in a surgical patient  History of Present Illness: Gregory [name] is an 82 y.o. male   Abdominal pain, nausea, vomiting. Started 1 day ago. Constant with waxing and waning nature. Getting worse. Nonradiating. Has not tried anything for relief. Last bowel movement 1 day ago. Last dose about course Wednesday morning at approximately 8 AM. Poor oral intake since that time. Denies any chest pain, palpitations, shortness of breath, fever, back pain, dysuria, frequency, neck stiffness, headache, LOC, melena, hematochezia.    Review of Systems:  ROS As per HPI otherwise 10 point review of systems negative.    Past Medical History: Past Medical History:  Diagnosis Date  . Arthritis   . CAD (coronary artery disease)    a. cath 03/2016: diffuse disease w/ 50% ostial LM stenosis, 75% ostial Cx, 50% 3rd Mrg, 65% distal RCA, and 50% RPDA. Medical management recommended.  Marland Kitchen GIB (gastrointestinal bleeding)    a. occurred in 2016, secondary to gastritis and diverticulosis  . Hypercholesteremia   . Hypertensive heart disease   . LVH (left ventricular hypertrophy)    a. 03/2016 Echo: EF 55-65%, no rwma, Gr1 DD, sev LVH, mildly dil RA/LA, small peric eff;  b. 03/2016 Cardiac MRI: sev LVH, mildly dil RV, possible RV thrombus, diffuse LGE involving RV and diff subendocardial LGE in LV. *No M spike on SPEP, nl immunofixation pattern - no clear evidence of cardiac amyloidosis.  . Murmur, cardiac   . PAF (paroxysmal atrial fibrillation) ([place])    a. new-onset 03/2016, placed on Eliquis (CHA2DS2VASc = 6).  . Poor appetite   . Stroke [facility])    a. nonhemorrhagic CVA in 2015    Past Surgical History: Past Surgical  History:  Procedure Laterality Date  . CARDIAC CATHETERIZATION N/A 03/26/2016   Procedure: Left Heart Cath and Coronary Angiography;  Surgeon: Troy Sine, MD;  Location: [place] CV LAB;  Service: Cardiovascular;  Laterality: N/A;  . COLONOSCOPY WITH PROPOFOL N/A 10/04/2014   Procedure: COLONOSCOPY WITH PROPOFOL;  Surgeon: Laurence Spates, MD;  Location: [place];  Service: Endoscopy;  Laterality: N/A;  . ESOPHAGOGASTRODUODENOSCOPY N/A 10/03/2014   Procedure: ESOPHAGOGASTRODUODENOSCOPY (EGD);  Surgeon: Winfield Cunas., MD;  Location: [facility] ENDOSCOPY;  Service: Endoscopy;  Laterality: N/A;     Allergies:   Allergies  Allergen Reactions  . Sulfa Antibiotics   . Ramipril   . Ramipril Other (See Comments)  . Sulfamethoxazole Rash     Social History:  reports that he has never smoked. He has never used smokeless tobacco. He reports that he does not drink alcohol or use drugs.   Family History: Family History  Problem Relation Age of Onset  . Hypertension Mother      Physical Exam: Vitals:   08/05/16 0738 08/05/16 1009 08/05/16 1052 08/05/16 1208  BP:  158/77 157/78 152/85  Pulse:  95 96 98  Resp:  22 26 17   Temp:      TempSrc:      SpO2:  96% 97% 98%  Weight: 77.7 kg (171 lb 4.8 oz)     Height: 5\' 11"  (1.803 m)       General: Mildly anxious, resting in bed.  Eyes:  PERRL, EOMI, normal lids, iris ENT: Moist mucous membranes, poor dentition Neck:  no LAD, masses or thyromegaly Cardiovascular:  RRR, no m/r/g. 2+RLE swelling. 1+ LLE swelling  Respiratory:  CTA bilaterally, no w/r/r. Normal respiratory effort. Abdomen: Hypoactive bowel sounds, diffusely tender soft, nondistended. Skin:  no rash or induration seen on limited exam Musculoskeletal:  grossly normal tone BUE/BLE, good ROM, no bony abnormality Psychiatric:  grossly normal mood and affect, speech fluent and appropriate, AOx3 Neurologic:  CN 2-12 grossly intact, moves all extremities in coordinated  fashion, sensation intact  Data reviewed:  I have personally reviewed following labs and imaging studies Labs:  CBC:  Recent Labs Lab 08/04/16 0825 08/05/16 0726  WBC 4.7 8.8  HGB 11.4* 11.9*  HCT 38.0* 36.2*  MCV 94.5 87.4  PLT 148 128*    Basic Metabolic Panel:  Recent Labs Lab 08/04/16 0825 08/05/16 0726  NA 142 140  K 4.3 3.3*  CL 107 107  CO2 27 21*  GLUCOSE 90 159*  BUN 18 12  CREATININE 0.67* 0.79  CALCIUM 9.1 9.7   GFR Estimated Creatinine Clearance: 73.2 mL/min (by C-G formula based on SCr of 0.79 mg/dL). Liver Function Tests:  Recent Labs Lab 08/04/16 0825 08/05/16 0726  AST 29 36  ALT 15 22  ALKPHOS 81 87  BILITOT 3.0* 4.0*  PROT 6.5 7.1  ALBUMIN 3.8 4.0    Recent Labs Lab 08/05/16 0726  LIPASE 27   No results for input(s): AMMONIA in the last 168 hours. Coagulation profile No results for input(s): INR, PROTIME in the last 168 hours.  Cardiac Enzymes: No results for input(s): CKTOTAL, CKMB, CKMBINDEX, TROPONINI in the last 168 hours. BNP: Invalid input(s): POCBNP CBG: No results for input(s): GLUCAP in the last 168 hours. D-Dimer No results for input(s): DDIMER in the last 72 hours. Hgb A1c No results for input(s): HGBA1C in the last 72 hours. Lipid Profile  Recent Labs  08/04/16 0825  CHOL 85  HDL 46  LDLCALC 33  TRIG 28  CHOLHDL 1.8   Thyroid function studies  Recent Labs  08/04/16 0825  TSH 1.74   Anemia work up No results for input(s): VITAMINB12, FOLATE, FERRITIN, TIBC, IRON, RETICCTPCT in the last 72 hours. Urinalysis    Component Value Date/Time   COLORURINE YELLOW 08/05/2016 1107   APPEARANCEUR CLEAR 08/05/2016 1107   LABSPEC 1.044 (H) 08/05/2016 1107   PHURINE 6.0 08/05/2016 1107   GLUCOSEU 50 (A) 08/05/2016 1107   HGBUR NEGATIVE 08/05/2016 1107   BILIRUBINUR NEGATIVE 08/05/2016 1107   KETONESUR 20 (A) 08/05/2016 1107   PROTEINUR NEGATIVE 08/05/2016 1107   UROBILINOGEN 4.0 (H) 01/30/2014 1402    NITRITE NEGATIVE 08/05/2016 1107   LEUKOCYTESUR NEGATIVE 08/05/2016 1107     Microbiology No results found for this or any previous visit (from the past 240 hour(s)).     Inpatient Medications:   Scheduled Meds: Continuous Infusions:   Radiological Exams on Admission: Ct Abdomen Pelvis W Contrast  Result Date: 08/05/2016 CLINICAL DATA:  Upper abdominal pain and nausea EXAM: CT ABDOMEN AND PELVIS WITH CONTRAST TECHNIQUE: Multidetector CT imaging of the abdomen and pelvis was performed using the standard protocol following bolus administration of intravenous contrast. CONTRAST:  150mL ISOVUE-300 IOPAMIDOL (ISOVUE-300) INJECTION 61% COMPARISON:  03/30/2016. FINDINGS: Lower chest: Lung bases demonstrate some mild dependent atelectatic changes. A large pericardial effusion is identified which is increased in the interval from the prior MRI of 03/30/2016. It measures approximately 3.7 cm along the posterior wall  of the left ventricle. Hepatobiliary: Mild early heterogeneity to the liver is noted related to the timing of the contrast bolus. Multiple gallstones are seen without complicating factors. Pancreas: Unremarkable. No pancreatic ductal dilatation or surrounding inflammatory changes. Spleen: Normal in size without focal abnormality. Adrenals/Urinary Tract: Adrenal glands are unremarkable. Kidneys are normal, without renal calculi, focal lesion, or hydronephrosis. Bladder is decompressed. Stomach/Bowel: Scattered diverticular changes noted without evidence of diverticulitis. The appendix is not well visualized although no inflammatory changes to suggest appendicitis are seen. There is evidence of small bowel dilatation secondary to a left inguinal hernia. A loop of small bowel extends into the hernia and is within normal limits distal to this although obstructed proximally. Vascular/Lymphatic: Aortic atherosclerosis. No enlarged abdominal or pelvic lymph nodes. Reproductive: Prostate appears to  have been surgically removed. Other: No abdominal wall hernia or abnormality. No abdominopelvic ascites. Musculoskeletal: No acute or significant osseous findings. IMPRESSION: Left inguinal hernia containing a loop of small bowel with evidence of incarceration and proximal obstruction. Enlarging pericardial effusion as described above. Cholelithiasis without complicating factors. Diverticulosis without diverticulitis. Electronically Signed   By: Inez Catalina M.D.   On: 08/05/2016 09:29    Impression/Recommendations Active Problems:   Hypertension   Hyperlipidemia   PAF (paroxysmal atrial fibrillation) (HCC)   CAD (coronary artery disease)   Chronic diastolic congestive heart failure ([place])   Incarcerated inguinal hernia  Incarcerated Inguinal Hernia: Patient evaluated by general surgery and planning on surgical intervention for left inguinal hernia which is incarcerated as noted above. Last dose of Eliquis ~08:00 on 08/04/16 - Management per primary team - CXR, EKG, Coags - Pt is a high risk surgical pt based on age and cardiac risk factors.  - [facility] services available to assume care once pt surgical condition stabilized if needed before discharge  HTN: normotensive to hypotensive - Resume home metop, Imdur, Cozaar, Norvasc on 08/06/16 - Hydralazine PRN  LE edema: present for several weeks. On Eliquis. R>L - Venous duplex - If neg for DVT then TED hose.   CAD: Status post coronary artery catheterization 03/26/2016 with angioplasty. EKG without evidence of ACS - Continue ASA  Chronic diastolic congestive heart failure: Last Echo showing EF of 55% and grade 1 diastolic dysfunction. No evidence of fluid overload at this time. - Continue lasix w/ first dose 17:00   PAF: EKG shows sinus. Rate controlled - Resume Eliquis per primary team - Continue ASA on 08/06/16 - resume bblocker when pressure normalizes postoperatively - Dilt PRN  HLD: - continue statin   Thank you  for this consultation.  Our [facility] hospitalist team will follow the patient with you.    Diahann Guajardo J M.D. [facility] 08/05/2016, 12:16 PM

## 2016-08-05 NOTE — Transfer of Care (Signed)
Immediate Anesthesia Transfer of Care Note  Patient: Gregory [name]  Procedure(s) Performed: Procedure(s): LAPAROSCOPY DIAGNOSTIC AND LAPAROSCOPIC INGUINAL HERNIA REPAIR. (N/A) EXPLORATORYLAPAROTOMY WITH  SMALL BOWEL RESECTION. (N/A)  Patient Location: PACU  Anesthesia Type:General  Level of Consciousness: responds to stimulation  Airway & Oxygen Therapy: Patient Spontanous Breathing and Patient connected to face mask oxygen  Post-op Assessment: Report given to RN, Post -op Vital signs reviewed and stable and Patient moving all extremities X 4  Post vital signs: Reviewed and stable  Last Vitals:  Vitals:   08/05/16 1052 08/05/16 1208  BP: 157/78 152/85  Pulse: 96 98  Resp: 26 17  Temp:      Last Pain:  Vitals:   08/05/16 1208  TempSrc:   PainSc: 8          Complications: No apparent anesthesia complications

## 2016-08-05 NOTE — ED Provider Notes (Signed)
[facility] DEPT Provider Note   CSN: [id number] Arrival date & time: 08/05/16  0720     History   Chief Complaint Chief Complaint  Patient presents with  . Abdominal Pain    HPI Gregory [name] is a 82 y.o. male.  HPI Patient presents with concern of abdominal pain, nausea, vomiting. Symptoms began yesterday. Since onset patient has had persistent symptoms, primarily with severe sore pain in the periumbilical and upper abdomen. Pain is nonradiating. No relief with anything. There is associated anorexia, and loose stool, though no sustained diarrhea. Patient is unsure of fever. He acknowledges a history of prior abdominal surgery, but is unsure of what it was. He denies other concurrent complaints, states that he was well until yesterday.  Past Medical History:  Diagnosis Date  . Arthritis   . CAD (coronary artery disease)    a. cath 03/2016: diffuse disease w/ 50% ostial LM stenosis, 75% ostial Cx, 50% 3rd Mrg, 65% distal RCA, and 50% RPDA. Medical management recommended.  Marland Kitchen GIB (gastrointestinal bleeding)    a. occurred in 2016, secondary to gastritis and diverticulosis  . Hypercholesteremia   . Hypertensive heart disease   . LVH (left ventricular hypertrophy)    a. 03/2016 Echo: EF 55-65%, no rwma, Gr1 DD, sev LVH, mildly dil RA/LA, small peric eff;  b. 03/2016 Cardiac MRI: sev LVH, mildly dil RV, possible RV thrombus, diffuse LGE involving RV and diff subendocardial LGE in LV. *No M spike on SPEP, nl immunofixation pattern - no clear evidence of cardiac amyloidosis.  . Murmur, cardiac   . PAF (paroxysmal atrial fibrillation) ([place])    a. new-onset 03/2016, placed on Eliquis (CHA2DS2VASc = 6).  . Poor appetite   . Stroke [facility])    a. nonhemorrhagic CVA in 2015    Patient Active Problem List   Diagnosis Date Noted  . Hypercholesteremia   . Hypertensive heart disease   . CAD (coronary artery disease)   . LVH (left ventricular hypertrophy)   . PAF (paroxysmal  atrial fibrillation) ([place])   . NSTEMI (non-ST elevated myocardial infarction) ([place]) 03/26/2016  . Acute coronary syndrome ([place])   . Chest pain   . GI bleed 10/03/2014  . H/O: CVA (cerebrovascular accident)   . CVA (cerebral infarction) 01/30/2014  . Right arm weakness 01/30/2014  . Hypertension 12/05/2010  . Hyperlipidemia 12/05/2010    Past Surgical History:  Procedure Laterality Date  . CARDIAC CATHETERIZATION N/A 03/26/2016   Procedure: Left Heart Cath and Coronary Angiography;  Surgeon: Troy Sine, MD;  Location: [facility] CV LAB;  Service: Cardiovascular;  Laterality: N/A;  . COLONOSCOPY WITH PROPOFOL N/A 10/04/2014   Procedure: COLONOSCOPY WITH PROPOFOL;  Surgeon: Laurence Spates, MD;  Location: [facility];  Service: Endoscopy;  Laterality: N/A;  . ESOPHAGOGASTRODUODENOSCOPY N/A 10/03/2014   Procedure: ESOPHAGOGASTRODUODENOSCOPY (EGD);  Surgeon: Winfield Cunas., MD;  Location: [facility] ENDOSCOPY;  Service: Endoscopy;  Laterality: N/A;       Home Medications    Prior to Admission medications   Medication Sig Start Date End Date Taking? Authorizing Provider  amLODipine (NORVASC) 2.5 MG tablet Take 1 tablet by mouth daily.    Historical Provider, MD  apixaban (ELIQUIS) 5 MG TABS tablet Take 1 tablet (5 mg total) by mouth 2 (two) times daily. 04/01/16   Erma Heritage, PA  aspirin EC 81 MG tablet Take 81 mg by mouth daily.    Historical Provider, MD  atorvastatin (LIPITOR) 40 MG tablet Take 1 tablet (40 mg  total) by mouth daily. 07/20/16 10/18/16  Troy Sine, MD  brimonidine (ALPHAGAN) 0.2 % ophthalmic solution Place 1 drop into both eyes at bedtime.    Historical Provider, MD  furosemide (LASIX) 20 MG tablet Take 1 tablet (20 mg total) by mouth daily. 07/20/16 10/18/16  Troy Sine, MD  isosorbide mononitrate (IMDUR) 30 MG 24 hr tablet Take 1 tablet (30 mg total) by mouth daily. 04/01/16   Erma Heritage, PA  losartan (COZAAR) 25 MG tablet Take 1 tablet (25 mg total) by  mouth daily. 04/01/16   Erma Heritage, PA  metoprolol (LOPRESSOR) 50 MG tablet Take 1 tablet (50 mg total) by mouth 2 (two) times daily. 04/01/16   Erma Heritage, PA    Family History Family History  Problem Relation Age of Onset  . Hypertension Mother     Social History Social History  Substance Use Topics  . Smoking status: Never Smoker  . Smokeless tobacco: Never Used  . Alcohol use No     Allergies   Sulfa antibiotics; Ramipril; Ramipril; and Sulfamethoxazole   Review of Systems Review of Systems  Constitutional:       Per HPI, otherwise negative  HENT:       Per HPI, otherwise negative  Respiratory:       Per HPI, otherwise negative  Cardiovascular:       Per HPI, otherwise negative  Gastrointestinal: Positive for abdominal pain, nausea and vomiting.  Endocrine:       Negative aside from HPI  Genitourinary:       Neg aside from HPI   Musculoskeletal:       Per HPI, otherwise negative  Skin: Negative.   Neurological: Negative for syncope.     Physical Exam Updated Vital Signs BP 110/58 (BP Location: Left Arm)   Pulse 92   Temp 97.6 F (36.4 C) (Oral)   Resp 18   Ht 5\' 11"  (1.803 m)   Wt 171 lb 4.8 oz (77.7 kg)   SpO2 100%   BMI 23.89 kg/m   Physical Exam  Constitutional: He is oriented to person, place, and time. He appears well-developed. No distress.  HENT:  Head: Normocephalic and atraumatic.  Eyes: Conjunctivae and EOM are normal.  Cardiovascular: Normal rate and regular rhythm.   Pulmonary/Chest: Effort normal. No stridor. No respiratory distress.  Abdominal: He exhibits no distension. There is generalized tenderness and tenderness in the epigastric area and periumbilical area.  Musculoskeletal: He exhibits no edema.  Neurological: He is alert and oriented to person, place, and time.  Skin: Skin is warm and dry.  Psychiatric: He has a normal mood and affect.  Nursing note and vitals reviewed.    ED Treatments / Results   Labs (all labs ordered are listed, but only abnormal results are displayed) Labs Reviewed  COMPREHENSIVE METABOLIC PANEL - Abnormal; Notable for the following:       Result Value   Potassium 3.3 (*)    CO2 21 (*)    Glucose, Bld 159 (*)    Total Bilirubin 4.0 (*)    All other components within normal limits  CBC - Abnormal; Notable for the following:    RBC 4.14 (*)    Hemoglobin 11.9 (*)    HCT 36.2 (*)    RDW 15.6 (*)    Platelets 128 (*)    All other components within normal limits  LIPASE, BLOOD  URINALYSIS, ROUTINE W REFLEX MICROSCOPIC  I-STAT TROPOININ, ED  EKG  EKG Interpretation  Date/Time:  Thursday August 05 2016 07:35:35 EST Ventricular Rate:  88 PR Interval:  222 QRS Duration: 144 QT Interval:  434 QTC Calculation: 525 R Axis:   -107 Text Interpretation:  Sinus rhythm with 1st degree A-V block Right bundle branch block No significant change since last tracing ST-t wave abnormality Abnormal ekg Confirmed by Carmin Muskrat  MD [phone]) on 08/05/2016 7:33:30 AM       Radiology Ct Abdomen Pelvis W Contrast  Result Date: 08/05/2016 CLINICAL DATA:  Upper abdominal pain and nausea EXAM: CT ABDOMEN AND PELVIS WITH CONTRAST TECHNIQUE: Multidetector CT imaging of the abdomen and pelvis was performed using the standard protocol following bolus administration of intravenous contrast. CONTRAST:  165mL ISOVUE-300 IOPAMIDOL (ISOVUE-300) INJECTION 61% COMPARISON:  03/30/2016. FINDINGS: Lower chest: Lung bases demonstrate some mild dependent atelectatic changes. A large pericardial effusion is identified which is increased in the interval from the prior MRI of 03/30/2016. It measures approximately 3.7 cm along the posterior wall of the left ventricle. Hepatobiliary: Mild early heterogeneity to the liver is noted related to the timing of the contrast bolus. Multiple gallstones are seen without complicating factors. Pancreas: Unremarkable. No pancreatic ductal dilatation or  surrounding inflammatory changes. Spleen: Normal in size without focal abnormality. Adrenals/Urinary Tract: Adrenal glands are unremarkable. Kidneys are normal, without renal calculi, focal lesion, or hydronephrosis. Bladder is decompressed. Stomach/Bowel: Scattered diverticular changes noted without evidence of diverticulitis. The appendix is not well visualized although no inflammatory changes to suggest appendicitis are seen. There is evidence of small bowel dilatation secondary to a left inguinal hernia. A loop of small bowel extends into the hernia and is within normal limits distal to this although obstructed proximally. Vascular/Lymphatic: Aortic atherosclerosis. No enlarged abdominal or pelvic lymph nodes. Reproductive: Prostate appears to have been surgically removed. Other: No abdominal wall hernia or abnormality. No abdominopelvic ascites. Musculoskeletal: No acute or significant osseous findings. IMPRESSION: Left inguinal hernia containing a loop of small bowel with evidence of incarceration and proximal obstruction. Enlarging pericardial effusion as described above. Cholelithiasis without complicating factors. Diverticulosis without diverticulitis. Electronically Signed   By: Inez Catalina M.D.   On: 08/05/2016 09:29    Procedures Procedures (including critical care time)  Medications Ordered in ED Medications  ondansetron (ZOFRAN-ODT) 4 MG disintegrating tablet (not administered)  sodium chloride 0.9 % bolus 1,000 mL (not administered)  fentaNYL (SUBLIMAZE) injection 25 mcg (not administered)  ondansetron (ZOFRAN-ODT) disintegrating tablet 4 mg (4 mg Oral Given 08/05/16 0727)    Echocardiogram: 03/26/2016 Study Conclusions   - Left ventricle: The cavity size was normal. Wall thickness was   increased in a pattern of severe LVH. Systolic function was   normal. The estimated ejection fraction was in the range of 55%   to 60%. Wall motion was normal; there were no regional wall   motion  abnormalities. Doppler parameters are consistent with   abnormal left ventricular relaxation (grade 1 diastolic   dysfunction). Doppler parameters are consistent with high   ventricular filling pressure. - Left atrium: The atrium was mildly dilated. - Right atrium: The atrium was mildly dilated. - Pericardium, extracardiac: A small pericardial effusion was   identified.   Impressions:   - Normal LV systolic function; grade 1 diastolic dysfunction with   elevated LV filling pressure; severe LVH; mild biatrial   enlargement; small pericardial effusion; myocardium has speckled   appearance concerning for amyloid; suggest further evaluation. Cardiac Catheterization: 03/26/2016  Ost LM to LM lesion, 50 %  stenosed.  Ost Cx lesion, 75 %stenosed.  Mid Cx lesion, 30 %stenosed.  3rd Mrg lesion, 50 %stenosed.  Prox RCA lesion, 30 %stenosed.  Dist RCA lesion, 65 %stenosed.  RPDA lesion, 50 %stenosed.  The left ventricular systolic function is normal.  LV end diastolic pressure is normal.  The left ventricular ejection fraction is greater than 65% by visual estimate  Initial Impression / Assessment and Plan / ED Course  I have reviewed the triage vital signs and the nursing notes.  Pertinent labs & imaging results that were available during my care of the patient were reviewed by me and considered in my medical decision making (see chart for details).  Clinical Course     On repeat exam the patient is in similar condition. Patientadvised that he had bilateral hernia repair at least 1 decade ago. He states that he has had some discomfort in his left lower abdomen for possibly several weeks and to becoming acutely symptomatically the past day. On repeat exam the patient has a palpable firmness in his left lower quadrant, just superior to the inguinal crease.  I discussed patient's case with our surgical colleagues, and then with the hospitalist team for admission. Chart review  notable for substantial recent evaluation due to NSTEMI, including echocardiogram, catheterization results as above.   Final Clinical Impressions(s) / ED Diagnoses  Incarcerated abdominal hernia with bowel obstruction   Carmin Muskrat, MD 08/05/16 1023

## 2016-08-05 NOTE — Op Note (Signed)
Operative Note  Gregory [name]  [id number]  [id number]  08/05/2016   Surgeon: Clovis Riley  Assistant: none  Procedure performed: Diagnostic laparoscopy, reduction of strangulated small bowel from left inguinal hernia, TAPP repair with phasix mesh, small bowel resection  Preop diagnosis: Incarcerated left inguinal hernia. This is an 82 year old gentleman with multiple medical problems who presented with an incarcerated left inguinal hernia. He thought he may have had a prior right inguinal hernia repair but was noted to have bilateral groin incisions and a lower midline scar as well.   Post-op diagnosis/intraop findings: strangulated small bowel within left inguinal hernia  Specimens: small bowel Retained items: none EBL: [id number] Complications: none  Description of procedure: After obtaining informed consent the patient was taken to the operating room and placed supine on operating room table wheregeneral endotracheal anesthesia was initiated, preoperative antibiotics were administered, SCDs applied, and a formal timeout was performed. The abdomen was prepped and draped in usual sterile fashion and then entered in the left upper quadrant using a Visiport technique. Insufflation to 15 mmHg ensued and our camera was introduced revealing no evidence of injury from our entry. The small bowel was dilated and inflamed appearing, and we are able to see the incarcerated loop in the left inguinal hernia immediately. Of note an epiploic appendage of the sigmoid colon was also very stuck within the hernia sac. I proceeded to place a infraumbilical 12 mm trocar and a right hemiabdomen 5 mm trocar and then reduced the small bowel with minimal effort. The antimesenteric border of the previously incarcerated loop was noted to be ischemic and required resection. I proceeded to reduce the remainder of the contents of the left inguinal hernia, of note it seems as though he had had a prior surgery in  this area as it was very difficult to mobilize the peritoneal flap and the tissue planes were completely ablated. Given that he was going to need a small bowel resection and the inability to reapproximate the peritoneum, I elected to use a 3 inch piece of phasic mesh to cover the indirect defect. This was tacked to the anterior abdominal wall and the Cooper's ligament using a secure strap tacker; what remained of the mobilized peritoneum was brought back up and tacked to partially cover the phasix mesh as well as to keep the inferior aspect opposed. The sigmoid colon was inspected and found to be free of injury. I then extended my umbilical incision in order to externalize the ischemic portion of small bowel. A 10 cm segment was resected with a primary side-to-side functional end-to-end anastomosis created with a blue load 11mm linear cutting stapler. The common enterotomy was closed with a TA 60 B. The mesenteric defect was closed with interrupted 3-0 Vicryl silk. A tension relieving suture was placed at the apex of the staple line. Hemostasis along the staple line was ensured. This was then reduced in the abdomen, and the fascia here was closed with interrupted 0 Vicryl sutures. The abdomen was then reinsufflated and a significant amount of hematoma was aspirated from the pelvis. Abdomen was irrigated with a liter of normal saline and this was aspirated as well. The hernia repair was reinspected and found to be intact. We then desufflated the abdomen and removed our trochars. The skin incisions were inspected for hemostasis which was ensured with electrocautery, and then the skin was reposited with staples. Sterile dressings were then applied. The patient was then awakened, extubated and taken to PACU in stable condition.  All counts were correct at the completion of the case.

## 2016-08-05 NOTE — ED Triage Notes (Signed)
Pt presents for evaluation of LLQ starting yesterday AM. Pt. Denies diarrhea/urinary symptoms, states that N/V started this AM. Pt reports he thought he had food poisoning. Pt. States hx of MI approx 4-5 months ago, recently started on elliquis.

## 2016-08-05 NOTE — Anesthesia Procedure Notes (Signed)
Procedure Name: Intubation Date/Time: 08/05/2016 2:20 PM Performed by: Mariea Clonts Pre-anesthesia Checklist: Patient identified, Emergency Drugs available, Suction available and Patient being monitored Patient Re-evaluated:Patient Re-evaluated prior to inductionOxygen Delivery Method: Circle System Utilized Preoxygenation: Pre-oxygenation with 100% oxygen Intubation Type: IV induction Ventilation: Mask ventilation without difficulty Laryngoscope Size: Miller and 2 Grade View: Grade II Tube type: Oral Tube size: 7.5 mm Number of attempts: 1 Airway Equipment and Method: Stylet and Oral airway Placement Confirmation: ETT inserted through vocal cords under direct vision,  positive ETCO2 and breath sounds checked- equal and bilateral Tube secured with: Tape Dental Injury: Teeth and Oropharynx as per pre-operative assessment

## 2016-08-05 NOTE — Progress Notes (Signed)
**  Preliminary report by tech**  Bilateral lower extremity venous duplex completed. There is no evidence of deep or superficial vein thrombosis involving the right and left lower extremities. All visualized vessels appear patent and compressible. There is no evidence of Baker's cysts bilaterally.  08/05/16 7:14 PM Carlos Levering RVT

## 2016-08-05 NOTE — Consult Note (Signed)
Cardiology Consult    Patient ID: Gregory Gregory MRN: [id number], DOB/AGE: 1929/12/09   Admit date: 08/05/2016 Date of Consult: 08/05/2016  Primary Physician: [name], MD Reason for Consult: Pre op Primary Cardiologist: Dr. [name]  Requesting Provider: Dr. Kae Heller  Patient Profile    Gregory Gregory is a 82 year old male with a past medical history of HTN, nonhemorrhagic CVA, GI bleed, STEMI 03/2016, CHF, A. Fib on Eliquis (last dose yesterday morning) who presented to the [facility] ([place] ED with complaints of progressively worsening lower abdominal pain that started yesterday. Needs to have an laparoscopic vs open laparotomy reduction of incarcerated left inguinal hernia with possible small bowel resection and repair of hernia. Cardiology was consulted for evaluation of cardiac risk pre operatively.   History of Present Illness    Gregory Gregory presented with abdominal pain and was found to have an incarcerated left inguinal hernia. He needs surgery to correct.   In August of this year he presented as a STEMI, he underwent emergent cardiac catheterization which revealed chronic calcification with 50% distal left main stenosis, a normal LAD, 75% ostial circumflex stenosis with 30% mid circumflex and 50% distal marginal stenosis, 30% proximal RCA with 60-70% distal stenosis proximal to the PDA takeoff and 50% distal PDA stenosis. Medical therapy was recommended and his EF was 55-60% at that time.   He developed PAF during his hospitalization and was started on eliquis prior to discharge.  Was maintaining sinus rhythm and was without anginal symptomatology.  He was seen by Dr. [name] on 07/20/16 and was doing well, he did not report any anginal symptoms. He tells me today that he does get SOB if he walks about 50 feet, but does not have any chest pain. He denies orthopnea and PND.   His EKG shows NSR with RBBB, no acute ischemic changes.    Past Medical History   Past Medical History:    Diagnosis Date  . Arthritis   . CAD (coronary artery disease)    a. cath 03/2016: diffuse disease w/ 50% ostial LM stenosis, 75% ostial Cx, 50% 3rd Mrg, 65% distal RCA, and 50% RPDA. Medical management recommended.  Marland Kitchen GIB (gastrointestinal bleeding)    a. occurred in 2016, secondary to gastritis and diverticulosis  . Hypercholesteremia   . Hypertensive heart disease   . LVH (left ventricular hypertrophy)    a. 03/2016 Echo: EF 55-65%, no rwma, Gr1 DD, sev LVH, mildly dil RA/LA, small peric eff;  b. 03/2016 Cardiac MRI: sev LVH, mildly dil RV, possible RV thrombus, diffuse LGE involving RV and diff subendocardial LGE in LV. *No M spike on SPEP, nl immunofixation pattern - no clear evidence of cardiac amyloidosis.  . Murmur, cardiac   . PAF (paroxysmal atrial fibrillation) ([place])    a. new-onset 03/2016, placed on Eliquis (CHA2DS2VASc = 6).  . Poor appetite   . Stroke [facility])    a. nonhemorrhagic CVA in 2015    Past Surgical History:  Procedure Laterality Date  . CARDIAC CATHETERIZATION N/A 03/26/2016   Procedure: Left Heart Cath and Coronary Angiography;  Surgeon: Troy Sine, MD;  Location: [place] [facility];  Service: Cardiovascular;  Laterality: N/A;  . COLONOSCOPY WITH PROPOFOL N/A 10/04/2014   Procedure: COLONOSCOPY WITH PROPOFOL;  Surgeon: Laurence Spates, MD;  Location: [place];  Service: Endoscopy;  Laterality: N/A;  . ESOPHAGOGASTRODUODENOSCOPY N/A 10/03/2014   Procedure: ESOPHAGOGASTRODUODENOSCOPY (EGD);  Surgeon: Winfield Cunas., MD;  Location[facility];  Service: Endoscopy;  Laterality: N/A;     Allergies  Allergies  Allergen Reactions  . Sulfa Antibiotics   . Ramipril   . Ramipril Other (See Comments)  . Sulfamethoxazole Rash    Inpatient Medications    . [START ON 08/06/2016] amLODipine  2.5 mg Oral Daily  . [START ON 08/06/2016] aspirin EC  81 mg Oral Daily  . [START ON 08/06/2016] atorvastatin  40 mg Oral Daily  . furosemide  20 mg Oral Daily  .  [START ON 08/06/2016] isosorbide mononitrate  30 mg Oral Daily  . [START ON 08/06/2016] losartan  25 mg Oral Daily  . [START ON 08/06/2016] metoprolol  50 mg Oral BID  . pantoprazole (PROTONIX) IV  40 mg Intravenous QHS    Family History    Family History  Problem Relation Age of Onset  . Hypertension Mother     Social History    Social History   Social History  . Marital status: Divorced    Spouse name: N/A  . Number of children: N/A  . Years of education: N/A   Occupational History  . Not on file.   Social History Main Topics  . Smoking status: Never Smoker  . Smokeless tobacco: Never Used  . Alcohol use No  . Drug use: No  . Sexual activity: Yes   Other Topics Concern  . Not on file   Social History Narrative  . No narrative on file     Review of Systems    General:  No chills, fever, night sweats or weight changes.  Cardiovascular:  No chest pain, dyspnea on exertion, edema, orthopnea, palpitations, paroxysmal nocturnal dyspnea. Dermatological: No rash, lesions/masses Respiratory: No cough, dyspnea Urologic: No hematuria, dysuria Abdominal:   No nausea, vomiting, diarrhea, bright red blood per rectum, melena, or hematemesis + abdominal pain  Neurologic:  No visual changes, wkns, changes in mental status. All other systems reviewed and are otherwise negative except as noted above.  Physical Exam    Blood pressure 152/85, pulse 98, temperature 97.6 F (36.4 C), temperature source Oral, resp. rate 17, height 5\' 11"  (1.803 m), weight 171 lb 4.8 oz (77.7 kg), SpO2 98 %.  General: Pleasant, thin african american male  Psych: Normal affect. Neuro: Alert and oriented X 3. Moves all extremities spontaneously. HEENT: Normal  Neck: Supple without bruits or JVD. Lungs:  Resp regular and unlabored, CTA. Heart: RRR no s3, s4. 2/6 systolic murmur.  Abdomen: Soft, tender, non-distended, BS + x 4.  Extremities: No clubbing, cyanosis. +1 pretibial edema. DP/PT/Radials  2+ and equal bilaterally.  Labs    Troponin [facility])  Recent Labs  08/05/16 0746  TROPIPOC 0.02    Lab Results  Component Value Date   WBC 8.8 08/05/2016   HGB 11.9 (L) 08/05/2016   HCT 36.2 (L) 08/05/2016   MCV 87.4 08/05/2016   PLT 128 (L) 08/05/2016    Recent Labs Lab 08/05/16 0726  NA 140  K 3.3*  CL 107  CO2 21*  BUN 12  CREATININE 0.79  CALCIUM 9.7  PROT 7.1  BILITOT 4.0*  ALKPHOS 87  ALT 22  AST 36  GLUCOSE 159*   Lab Results  Component Value Date   CHOL 85 12020/05/2116   HDL 46 12020/05/2116   LDLCALC 33 12020/05/2116   TRIG 28 12020/05/2116   No results found for: [facility]   Radiology Studies    Ct Abdomen Pelvis W Contrast  Result Date: 08/05/2016 CLINICAL DATA:  Upper abdominal pain and  nausea EXAM: CT ABDOMEN AND PELVIS WITH CONTRAST TECHNIQUE: Multidetector CT imaging of the abdomen and pelvis was performed using the standard protocol following bolus administration of intravenous contrast. CONTRAST:  165mL ISOVUE-300 IOPAMIDOL (ISOVUE-300) INJECTION 61% COMPARISON:  03/30/2016. FINDINGS: Lower chest: Lung bases demonstrate some mild dependent atelectatic changes. A large pericardial effusion is identified which is increased in the interval from the prior MRI of 03/30/2016. It measures approximately 3.7 cm along the posterior wall of the left ventricle. Hepatobiliary: Mild early heterogeneity to the liver is noted related to the timing of the contrast bolus. Multiple gallstones are seen without complicating factors. Pancreas: Unremarkable. No pancreatic ductal dilatation or surrounding inflammatory changes. Spleen: Normal in size without focal abnormality. Adrenals/Urinary Tract: Adrenal glands are unremarkable. Kidneys are normal, without renal calculi, focal lesion, or hydronephrosis. Bladder is decompressed. Stomach/Bowel: Scattered diverticular changes noted without evidence of diverticulitis. The appendix is not well visualized although no  inflammatory changes to suggest appendicitis are seen. There is evidence of small bowel dilatation secondary to a left inguinal hernia. A loop of small bowel extends into the hernia and is within normal limits distal to this although obstructed proximally. Vascular/Lymphatic: Aortic atherosclerosis. No enlarged abdominal or pelvic lymph nodes. Reproductive: Prostate appears to have been surgically removed. Other: No abdominal wall hernia or abnormality. No abdominopelvic ascites. Musculoskeletal: No acute or significant osseous findings. IMPRESSION: Left inguinal hernia containing a loop of small bowel with evidence of incarceration and proximal obstruction. Enlarging pericardial effusion as described above. Cholelithiasis without complicating factors. Diverticulosis without diverticulitis. Electronically Signed   By: Inez Catalina M.D.   On: 08/05/2016 09:29   Dg Chest Port 1 View  Result Date: 08/05/2016 CLINICAL DATA:  Peripheral effusion, shortness of breath EXAM: PORTABLE CHEST 1 VIEW COMPARISON:  CT chest 03/27/2016 FINDINGS: There is no focal parenchymal opacity. There is no pleural effusion or pneumothorax. The cardiac silhouette is enlarged likely reflecting a pericardial effusion given the prior exams. The osseous structures are unremarkable. IMPRESSION: 1. Enlarged cardiac silhouette likely reflecting a pericardial effusion. Electronically Signed   By: Kathreen Devoid   On: 08/05/2016 13:43    EKG & Cardiac Imaging    EKG: NSR, RBBB  Echocardiogram: Last was 03/26/16 Study Conclusions  - Left ventricle: The cavity size was normal. Wall thickness was   increased in a pattern of severe LVH. Systolic function was   normal. The estimated ejection fraction was in the range of 55%   to 60%. Wall motion was normal; there were no regional wall   motion abnormalities. Doppler parameters are consistent with   abnormal left ventricular relaxation (grade 1 diastolic   dysfunction). Doppler parameters  are consistent with high   ventricular filling pressure. - Left atrium: The atrium was mildly dilated. - Right atrium: The atrium was mildly dilated. - Pericardium, extracardiac: A small pericardial effusion was   identified.  Impressions:  - Normal LV systolic function; grade 1 diastolic dysfunction with   elevated LV filling pressure; severe LVH; mild biatrial   enlargement; small pericardial effusion; myocardium has speckled   appearance concerning for amyloid; suggest further evaluation.  Assessment & Plan    1. Pre - operative evaluation of cardiac risk: Patient had a recent ischemic evaluation in the setting of STEMI, however he was found to have 50% (appeared chronic) ostial to LM stenosis, 75% ostial Cx, 60-70% distal RCA stenosis. Aggressive medical management was recommended, he has done well since and denies anginal symptoms.   He is at  moderate to high risk for cardiac complications, however he is not having any ischemic symptoms at this time and it is medically necessary that he have his incarcerated hernia fixed.   He does have a systolic murmur that was noted by his primary Cardiologist, we will do a follow up Echo while he is here.   Signed, Arbutus Leas, NP 08/05/2016, 1:58 PM Pager: [phone] As above, patient seen and examined. Briefly he is an 82 year old male with past medical history of coronary artery disease, paroxysmal atrial fibrillation, hypertension, prior CVA and for preoperative evaluation prior to repair of incarcerated inguinal hernia. Pt with a STEMI in August 2017. Echocardiogram showed normal LV function. Patient had a cardiac catheterization that revealed a 50% ostial left main, 75% circumflex, 50% third marginal, 65% distal right coronary artery and 50% PDA. Medical therapy recommended. Cardiac MRI was suggestive of amyloid by report. There was a moderate pericardial effusion. Patient also developed paroxysmal atrial fibrillation and was  anticoagulated. He presents with complaints of abdominal pain and has been found to have an incarcerated hernia in cardiology asked to evaluate preoperatively. He has some dyspnea on exertion but denies any chest pain since his infarct in August. He can ambulate 50 large without having chest pain. His electrocardiogram shows sinus rhythm, right bundle branch block, left posterior fascicular block, inferior infarct.  1 preoperative evaluation prior to incarcerated inguinal hernia repair-surgery is deemed emergent. Given his infarct in August, residual coronary disease and age his risk is higher. However he has not had recent chest pain. There are also no alternatives to surgery and he may proceed without further cardiac evaluation.  2 pericardial effusion-small by echocardiogram August 2017. Interpreted as large on CT scan of his abdomen today. Patient is not hemodynamically in tamponade. His systolic blood pressure is 150 and he is not tachycardic. We will need to proceed with echocardiogram postoperatively to further assess. I would recommend holding anticoagulation to decrease risk of hemorrhagic transformation. Follow closely postoperatively.   3 coronary artery disease-continue aspirin and statin.   4 hypertension-continue preadmission blood pressure medications.   5 paroxysmal atrial fibrillation-patient is in sinus rhythm on electrocardiogram. Continue metoprolol. I would recommend holding anticoagulation given documented pericardial effusion.   6 Murmur-will repeat echo postoperatively as described above.  Kirk Ruths, MD

## 2016-08-05 NOTE — Progress Notes (Signed)
Arrived from PACU to 6n11. Denies nausea/pain at this time

## 2016-08-06 ENCOUNTER — Inpatient Hospital Stay (HOSPITAL_COMMUNITY): Payer: Medicare Other

## 2016-08-06 ENCOUNTER — Encounter (HOSPITAL_COMMUNITY): Admission: EM | Disposition: A | Discharge status: Skilled Nursing Facility | Payer: Self-pay | Source: Home / Self Care

## 2016-08-06 ENCOUNTER — Other Ambulatory Visit (HOSPITAL_COMMUNITY): Payer: Medicare Other

## 2016-08-06 ENCOUNTER — Ambulatory Visit (HOSPITAL_COMMUNITY): Admit: 2016-08-06 | Payer: Medicare Other | Admitting: Cardiovascular Disease

## 2016-08-06 DIAGNOSIS — I480 Paroxysmal atrial fibrillation: Secondary | ICD-10-CM

## 2016-08-06 DIAGNOSIS — I959 Hypotension, unspecified: Secondary | ICD-10-CM

## 2016-08-06 DIAGNOSIS — I313 Pericardial effusion (noninflammatory): Secondary | ICD-10-CM

## 2016-08-06 DIAGNOSIS — E440 Moderate protein-calorie malnutrition: Secondary | ICD-10-CM | POA: Insufficient documentation

## 2016-08-06 DIAGNOSIS — I2510 Atherosclerotic heart disease of native coronary artery without angina pectoris: Secondary | ICD-10-CM

## 2016-08-06 DIAGNOSIS — I314 Cardiac tamponade: Secondary | ICD-10-CM

## 2016-08-06 HISTORY — PX: CARDIAC CATHETERIZATION: SHX172

## 2016-08-06 LAB — CBC
HEMATOCRIT: 31.8 % — AB (ref 39.0–52.0)
Hemoglobin: 10.3 g/dL — ABNORMAL LOW (ref 13.0–17.0)
MCH: 28.8 pg (ref 26.0–34.0)
MCHC: 32.4 g/dL (ref 30.0–36.0)
MCV: 88.8 fL (ref 78.0–100.0)
PLATELETS: 119 10*3/uL — AB (ref 150–400)
RBC: 3.58 MIL/uL — ABNORMAL LOW (ref 4.22–5.81)
RDW: 15.7 % — AB (ref 11.5–15.5)
WBC: 10.3 10*3/uL (ref 4.0–10.5)

## 2016-08-06 LAB — COMPREHENSIVE METABOLIC PANEL
ALBUMIN: 2.9 g/dL — AB (ref 3.5–5.0)
ALT: 15 U/L — ABNORMAL LOW (ref 17–63)
ANION GAP: 8 (ref 5–15)
AST: 28 U/L (ref 15–41)
Alkaline Phosphatase: 59 U/L (ref 38–126)
BILIRUBIN TOTAL: 4.8 mg/dL — AB (ref 0.3–1.2)
BUN: 11 mg/dL (ref 6–20)
CO2: 23 mmol/L (ref 22–32)
Calcium: 8.7 mg/dL — ABNORMAL LOW (ref 8.9–10.3)
Chloride: 110 mmol/L (ref 101–111)
Creatinine, Ser: 0.75 mg/dL (ref 0.61–1.24)
GFR calc Af Amer: >60 mL/min (ref >60)
GFR calc non Af Amer: >60 mL/min (ref >60)
GLUCOSE: 124 mg/dL — AB (ref 65–99)
POTASSIUM: 3.6 mmol/L (ref 3.5–5.1)
SODIUM: 141 mmol/L (ref 135–145)
TOTAL PROTEIN: 5.4 g/dL — AB (ref 6.5–8.1)

## 2016-08-06 LAB — ECHOCARDIOGRAM COMPLETE
Height: 71 in
Weight: 2740.8 oz

## 2016-08-06 LAB — CREATININE, FLUID (PLEURAL, PERITONEAL, JP DRAINAGE): CREAT FL: 0.8 mg/dL

## 2016-08-06 LAB — BODY FLUID CELL COUNT WITH DIFFERENTIAL
LYMPHS FL: 83 %
Monocyte-Macrophage-Serous Fluid: 17 % — ABNORMAL LOW (ref 50–90)
Total Nucleated Cell Count, Fluid: 138 cu mm (ref 0–1000)

## 2016-08-06 LAB — GLUCOSE, RANDOM: Glucose, Bld: 107 mg/dL — ABNORMAL HIGH (ref 65–99)

## 2016-08-06 SURGERY — PERICARDIOCENTESIS
Anesthesia: LOCAL

## 2016-08-06 MED ORDER — SODIUM CHLORIDE 0.9 % IV BOLUS (SEPSIS)
250.0000 mL | Freq: Once | INTRAVENOUS | Status: AC
  Administered 2016-08-06: 250 mL via INTRAVENOUS

## 2016-08-06 MED ORDER — HEPARIN (PORCINE) IN NACL 2-0.9 UNIT/ML-% IJ SOLN
INTRAMUSCULAR | Status: AC
  Filled 2016-08-06: qty 500

## 2016-08-06 MED ORDER — MIDAZOLAM HCL 2 MG/2ML IJ SOLN
INTRAMUSCULAR | Status: AC
  Filled 2016-08-06: qty 2

## 2016-08-06 MED ORDER — LIDOCAINE HCL (PF) 1 % IJ SOLN
INTRAMUSCULAR | Status: DC | PRN
  Administered 2016-08-06: 15 mL via SUBCUTANEOUS

## 2016-08-06 MED ORDER — SODIUM CHLORIDE 0.9 % IV SOLN
INTRAVENOUS | Status: DC | PRN
  Administered 2016-08-06: 10 mL/h via INTRAVENOUS

## 2016-08-06 MED ORDER — HYDROMORPHONE HCL 1 MG/ML IJ SOLN
0.5000 mg | INTRAMUSCULAR | Status: DC | PRN
  Administered 2016-08-06 – 2016-08-13 (×15): 0.5 mg via INTRAVENOUS
  Filled 2016-08-06 (×15): qty 1

## 2016-08-06 MED ORDER — LIDOCAINE HCL (PF) 1 % IJ SOLN
INTRAMUSCULAR | Status: AC
  Filled 2016-08-06: qty 30

## 2016-08-06 MED ORDER — WHITE PETROLATUM GEL
Status: AC
  Administered 2016-08-06: 1 via TOPICAL
  Filled 2016-08-06: qty 1

## 2016-08-06 MED ORDER — FENTANYL CITRATE (PF) 100 MCG/2ML IJ SOLN
INTRAMUSCULAR | Status: DC | PRN
  Administered 2016-08-06: 25 ug via INTRAVENOUS

## 2016-08-06 MED ORDER — METOPROLOL TARTRATE 5 MG/5ML IV SOLN
5.0000 mg | Freq: Once | INTRAVENOUS | Status: AC
  Administered 2016-08-06: 5 mg via INTRAVENOUS
  Filled 2016-08-06: qty 5

## 2016-08-06 MED ORDER — METOPROLOL TARTRATE 5 MG/5ML IV SOLN: 5.0000 mg | Freq: Once | INTRAVENOUS | Status: AC

## 2016-08-06 MED ORDER — MIDAZOLAM HCL 2 MG/2ML IJ SOLN
INTRAMUSCULAR | Status: DC | PRN
  Administered 2016-08-06: 1 mg via INTRAVENOUS

## 2016-08-06 MED ORDER — FENTANYL CITRATE (PF) 100 MCG/2ML IJ SOLN
INTRAMUSCULAR | Status: AC
  Filled 2016-08-06: qty 2

## 2016-08-06 MED ORDER — BOOST / RESOURCE BREEZE PO LIQD
1.0000 | Freq: Three times a day (TID) | ORAL | Status: DC
  Administered 2016-08-07 – 2016-08-10 (×6): 1 via ORAL

## 2016-08-06 SURGICAL SUPPLY — 3 items
EVACUATOR 1/8 PVC DRAIN (DRAIN) ×1 IMPLANT
PACK CARDIAC CATHETERIZATION (CUSTOM PROCEDURE TRAY) ×1 IMPLANT
PERIVAC PERICARDIOCENTESIS 8.3 (TRAY / TRAY PROCEDURE) ×1 IMPLANT

## 2016-08-06 NOTE — H&P (View-Only) (Signed)
Echo reviewed; large pericardial effusion with evidence of tamponade physiology; he has had hypotension in setting of SVT but effusion could be contributing; his apixaban has been held; discussed with Dr Burt Knack; will plan pericardiocentesis. Discussed with Tobie Lords, patient's son. 30 min spent reviewing pts echo, discussing with pt and son. Kirk Ruths, MD

## 2016-08-06 NOTE — Progress Notes (Signed)
Patient Name: Gregory [name] Date of Encounter: 08/06/2016  Primary Cardiologist: Dr Clearnce Hasten Problem List     Active Problems:   Hypertension   Hyperlipidemia   PAF (paroxysmal atrial fibrillation) (HCC)   CAD (coronary artery disease)   Chronic diastolic congestive heart failure (HCC)   Incarcerated inguinal hernia   Incarcerated left inguinal hernia   Preop cardiovascular exam     Subjective   No chest pain or dyspnea; abd pain noted  Inpatient Medications    Scheduled Meds: . amLODipine  2.5 mg Oral Daily  . aspirin EC  81 mg Oral Daily  . atorvastatin  40 mg Oral Daily  . furosemide  20 mg Oral Daily  . isosorbide mononitrate  30 mg Oral Daily  . losartan  25 mg Oral Daily  . metoprolol  50 mg Oral BID  . pantoprazole (PROTONIX) IV  40 mg Intravenous QHS   Continuous Infusions: . sodium chloride 50 mL/hr at 08/06/16 0521   PRN Meds: acetaminophen **OR** acetaminophen, diltiazem, diphenhydrAMINE **OR** diphenhydrAMINE, fentaNYL (SUBLIMAZE) injection, hydrALAZINE, HYDROmorphone (DILAUDID) injection, ondansetron **OR** ondansetron (ZOFRAN) IV   Vital Signs    Vitals:   08/06/16 0029 08/06/16 0107 08/06/16 0231 08/06/16 0628  BP: 90/72 125/82 129/83 (!) 150/89  Pulse: (!) 163 (!) 109 (!) 113 (!) 110  Resp:    18  Temp: 98.5 F (36.9 C)  98.1 F (36.7 C) 97.9 F (36.6 C)  TempSrc: Oral  Oral Oral  SpO2: 97% 95% 94% 94%  Weight:      Height:        Intake/Output Summary (Last 24 hours) at 08/06/16 0723 Last data filed at 08/06/16 0630  Gross per 24 hour  Intake             2550 ml  Output             1290 ml  Net             1260 ml   Filed Weights   08/05/16 0738  Weight: 171 lb 4.8 oz (77.7 kg)    Physical Exam    GEN: Well nourished, well developed, in no acute distress.  HEENT: Grossly normal.  Neck: Supple Cardiac: RRR; 1+ ankle Respiratory: diminished BS bases GI: Soft, s/p abd surgery. MS: no deformity or atrophy. Skin:  warm and dry, no rash. Neuro:  Strength and sensation are intact. Psych: AAOx3.  Normal affect.  Labs    CBC  Recent Labs  08/05/16 0726 08/06/16 0119  WBC 8.8 10.3  HGB 11.9* 10.3*  HCT 36.2* 31.8*  MCV 87.4 88.8  PLT 128* 123456*   Basic Metabolic Panel  Recent Labs  08/05/16 0726 08/06/16 0119  NA 140 141  K 3.3* 3.6  CL 107 110  CO2 21* 23  GLUCOSE 159* 124*  BUN 12 11  CREATININE 0.79 0.75  CALCIUM 9.7 8.7*   Liver Function Tests  Recent Labs  08/05/16 0726 08/06/16 0119  AST 36 28  ALT 22 15*  ALKPHOS 87 59  BILITOT 4.0* 4.8*  PROT 7.1 5.4*  ALBUMIN 4.0 2.9*    Recent Labs  08/05/16 0726  LIPASE 27   Fasting Lipid Panel  Recent Labs  08/04/16 0825  CHOL 85  HDL 46  LDLCALC 33  TRIG 28  CHOLHDL 1.8   Thyroid Function Tests  Recent Labs  08/04/16 0825  TSH 1.74    Telemetry    Sinus with episodes of SVT and  PVCs - Personally Reviewed   Radiology    Ct Abdomen Pelvis W Contrast  Result Date: 08/05/2016 CLINICAL DATA:  Upper abdominal pain and nausea EXAM: CT ABDOMEN AND PELVIS WITH CONTRAST TECHNIQUE: Multidetector CT imaging of the abdomen and pelvis was performed using the standard protocol following bolus administration of intravenous contrast. CONTRAST:  16mL ISOVUE-300 IOPAMIDOL (ISOVUE-300) INJECTION 61% COMPARISON:  03/30/2016. FINDINGS: Lower chest: Lung bases demonstrate some mild dependent atelectatic changes. A large pericardial effusion is identified which is increased in the interval from the prior MRI of 03/30/2016. It measures approximately 3.7 cm along the posterior wall of the left ventricle. Hepatobiliary: Mild early heterogeneity to the liver is noted related to the timing of the contrast bolus. Multiple gallstones are seen without complicating factors. Pancreas: Unremarkable. No pancreatic ductal dilatation or surrounding inflammatory changes. Spleen: Normal in size without focal abnormality. Adrenals/Urinary Tract:  Adrenal glands are unremarkable. Kidneys are normal, without renal calculi, focal lesion, or hydronephrosis. Bladder is decompressed. Stomach/Bowel: Scattered diverticular changes noted without evidence of diverticulitis. The appendix is not well visualized although no inflammatory changes to suggest appendicitis are seen. There is evidence of small bowel dilatation secondary to a left inguinal hernia. A loop of small bowel extends into the hernia and is within normal limits distal to this although obstructed proximally. Vascular/Lymphatic: Aortic atherosclerosis. No enlarged abdominal or pelvic lymph nodes. Reproductive: Prostate appears to have been surgically removed. Other: No abdominal wall hernia or abnormality. No abdominopelvic ascites. Musculoskeletal: No acute or significant osseous findings. IMPRESSION: Left inguinal hernia containing a loop of small bowel with evidence of incarceration and proximal obstruction. Enlarging pericardial effusion as described above. Cholelithiasis without complicating factors. Diverticulosis without diverticulitis. Electronically Signed   By: Inez Catalina M.D.   On: 08/05/2016 09:29   Dg Chest Port 1 View  Result Date: 08/05/2016 CLINICAL DATA:  Peripheral effusion, shortness of breath EXAM: PORTABLE CHEST 1 VIEW COMPARISON:  CT chest 03/27/2016 FINDINGS: There is no focal parenchymal opacity. There is no pleural effusion or pneumothorax. The cardiac silhouette is enlarged likely reflecting a pericardial effusion given the prior exams. The osseous structures are unremarkable. IMPRESSION: 1. Enlarged cardiac silhouette likely reflecting a pericardial effusion. Electronically Signed   By: Kathreen Devoid   On: 08/05/2016 13:43     Patient Profile     82 year old male with past medical history of coronary artery disease, paroxysmal atrial fibrillation, hypertension, prior CVA evaluated preoperatively prior to repair of incarcerated inguinal hernia. Pt had a STEMI in  August 2017. Echocardiogram showed normal LV function, small pericardial effusion. Patient had a cardiac catheterization that revealed a 50% ostial left main, 75% circumflex, 50% third marginal, 65% distal right coronary artery and 50% PDA. Medical therapy recommended. Cardiac MRI was suggestive of amyloid by report. There was a moderate pericardial effusion. Patient also developed paroxysmal atrial fibrillation and was anticoagulated. He presented with abdominal pain and was been found to have an incarcerated hernia and cardiology asked to evaluate preoperatively. Note abd CT also showed large pericardial effusion. Had reduction of strangulated small bowel and small bowel resection 12/21. Also with SVT postoperatively.  Assessment & Plan   1 pericardial effusion-small by echocardiogram August 2017. Interpreted as large on CT scan of his abdomen 12/21. Patient not clinically in tamponade. We will repeat echocardiogram today to further assess. I would recommend holding anticoagulation to decrease risk of hemorrhagic transformation.   2 SVT-postoperatively on telemetry. In sinus at present. Continue metoprolol; DC cardizem.  3 coronary artery  disease-continue aspirin and statin.   4 hypertension-BP decreased last PM in setting of SVT; SBP now 150; appears mildly volume overloaded on exam; DC IVFs; continue home dose of lasix; hold imdur.  5 paroxysmal atrial fibrillation-patient is in sinus rhythm today. Continue metoprolol. I would recommend holding anticoagulation given documented pericardial effusion.   6 Murmur-will repeat echo as described above.  Signed, Kirk Ruths, MD  08/06/2016, 7:23 AM

## 2016-08-06 NOTE — Progress Notes (Signed)
Notified NP Baltazar Najjar via telephone of patient's condition and vital signs. STAT EKG obtained on patient. NP Baltazar Najjar ordered another 250cc NS bolus.

## 2016-08-06 NOTE — Progress Notes (Signed)
[place] Progress Note Patient Name: Gregory [name] DOB: 22-Sep-1929 MRN: [id number]   New patient eval    ICD-9-CM ICD-10-CM   1. Preop cardiovascular exam V72.81 Z01.810 DG CHEST PORT 1 VIEW     DG CHEST PORT 1 VIEW    Patient Active Problem List   Diagnosis Date Noted  . Malnutrition of moderate degree 08/06/2016  . Hypotension   . Cardiac tamponade   . Chronic diastolic congestive heart failure ([place]) 08/05/2016  . Incarcerated inguinal hernia 08/05/2016  . Incarcerated left inguinal hernia 08/05/2016  . Preop cardiovascular exam   . Hypercholesteremia   . Hypertensive heart disease   . CAD (coronary artery disease)   . LVH (left ventricular hypertrophy)   . PAF (paroxysmal atrial fibrillation) ([place])   . NSTEMI (non-ST elevated myocardial infarction) ([place]) 03/26/2016  . Acute coronary syndrome ([place])   . Chest pain   . GI bleed 10/03/2014  . H/O: CVA (cerebrovascular accident)   . CVA (cerebral infarction) 01/30/2014  . Right arm weakness 01/30/2014  . Hypertension 12/05/2010  . Hyperlipidemia 12/05/2010      Camera He is on o2, HR 101, Looking aruond talking. Comfortable HR 102, MAP 97, pulse ox 94%,  RR 19 No distress   PULMONARY No results for input(s): PHART, PCO2ART, PO2ART, HCO3, TCO2, O2SAT in the last 168 hours.  Invalid input(s): PCO2, PO2  CBC  Recent Labs Lab 08/04/16 0825 08/05/16 0726 08/06/16 0119  HGB 11.4* 11.9* 10.3*  HCT 38.0* 36.2* 31.8*  WBC 4.7 8.8 10.3  PLT 148 128* 119*    COAGULATION  Recent Labs Lab 08/05/16 1214  INR 1.37    CARDIAC  No results for input(s): TROPONINI in the last 168 hours. No results for input(s): PROBNP in the last 168 hours.   CHEMISTRY  Recent Labs Lab 08/04/16 0825 08/05/16 0726 08/06/16 0119  NA 142 140 141  K 4.3 3.3* 3.6  CL 107 107 110  CO2 27 21* 23  GLUCOSE 90 159* 124*  BUN 18 12 11   CREATININE 0.67* 0.79 0.75  CALCIUM 9.1 9.7 8.7*   Estimated Creatinine  Clearance: 73.2 mL/min (by C-G formula based on SCr of 0.75 mg/dL).   LIVER  Recent Labs Lab 08/04/16 0825 08/05/16 0726 08/05/16 1214 08/06/16 0119  AST 29 36  --  28  ALT 15 22  --  15*  ALKPHOS 81 87  --  59  BILITOT 3.0* 4.0*  --  4.8*  PROT 6.5 7.1  --  5.4*  ALBUMIN 3.8 4.0  --  2.9*  INR  --   --  1.37  --      INFECTIOUS No results for input(s): LATICACIDVEN, PROCALCITON in the last 168 hours.   ENDOCRINE CBG (last 3)  No results for input(s): GLUCAP in the last 72 hours.       IMAGING x48h  - image(s) personally visualized  -   highlighted in bold Ct Abdomen Pelvis W Contrast  Result Date: 08/05/2016 CLINICAL DATA:  Upper abdominal pain and nausea EXAM: CT ABDOMEN AND PELVIS WITH CONTRAST TECHNIQUE: Multidetector CT imaging of the abdomen and pelvis was performed using the standard protocol following bolus administration of intravenous contrast. CONTRAST:  173mL ISOVUE-300 IOPAMIDOL (ISOVUE-300) INJECTION 61% COMPARISON:  03/30/2016. FINDINGS: Lower chest: Lung bases demonstrate some mild dependent atelectatic changes. A large pericardial effusion is identified which is increased in the interval from the prior MRI of 03/30/2016. It measures approximately 3.7 cm along the posterior wall of  the left ventricle. Hepatobiliary: Mild early heterogeneity to the liver is noted related to the timing of the contrast bolus. Multiple gallstones are seen without complicating factors. Pancreas: Unremarkable. No pancreatic ductal dilatation or surrounding inflammatory changes. Spleen: Normal in size without focal abnormality. Adrenals/Urinary Tract: Adrenal glands are unremarkable. Kidneys are normal, without renal calculi, focal lesion, or hydronephrosis. Bladder is decompressed. Stomach/Bowel: Scattered diverticular changes noted without evidence of diverticulitis. The appendix is not well visualized although no inflammatory changes to suggest appendicitis are seen. There is  evidence of small bowel dilatation secondary to a left inguinal hernia. A loop of small bowel extends into the hernia and is within normal limits distal to this although obstructed proximally. Vascular/Lymphatic: Aortic atherosclerosis. No enlarged abdominal or pelvic lymph nodes. Reproductive: Prostate appears to have been surgically removed. Other: No abdominal wall hernia or abnormality. No abdominopelvic ascites. Musculoskeletal: No acute or significant osseous findings. IMPRESSION: Left inguinal hernia containing a loop of small bowel with evidence of incarceration and proximal obstruction. Enlarging pericardial effusion as described above. Cholelithiasis without complicating factors. Diverticulosis without diverticulitis. Electronically Signed   By: Inez Catalina M.D.   On: 08/05/2016 09:29   Dg Chest Port 1 View  Result Date: 08/05/2016 CLINICAL DATA:  Peripheral effusion, shortness of breath EXAM: PORTABLE CHEST 1 VIEW COMPARISON:  CT chest 03/27/2016 FINDINGS: There is no focal parenchymal opacity. There is no pleural effusion or pneumothorax. The cardiac silhouette is enlarged likely reflecting a pericardial effusion given the prior exams. The osseous structures are unremarkable. IMPRESSION: 1. Enlarged cardiac silhouette likely reflecting a pericardial effusion. Electronically Signed   By: Kathreen Devoid   On: 08/05/2016 13:43     A Ingulnal hernia with obstrn Pericardial effusion  P Nil acute from eMD at this point   Intervention Category Evaluation Type: New Patient Evaluation  Gregory [name] 08/06/2016, 7:20 PM

## 2016-08-06 NOTE — Progress Notes (Signed)
  Echocardiogram 2D Echocardiogram has been performed.  Jennette Dubin 08/06/2016, 9:36 AM

## 2016-08-06 NOTE — Progress Notes (Signed)
PROGRESS NOTE    Gregory [name]  [id number] DOB: 24-Jan-1930 DOA: 08/05/2016 PCP: [name], MD   Chief Complaint  Patient presents with  . Abdominal Pain    Brief Narrative:  82 y.o. male presented with Abdominal pain, nausea, vomiting. Started 1 day ago. Constant with waxing and waning nature. Getting worse. Nonradiating. Has not tried anything for relief. Last bowel movement 1 day ago. Last dose about course Wednesday morning at approximately 8 AM. Poor oral intake since that time. Denies any chest pain, palpitations, shortness of breath, fever, back pain, dysuria, frequency, neck stiffness, headache, LOC, melena, hematochezia.  Consult medical management  Assessment & Plan   Incarcerated Inguinal Hernia -Per primary team, general surgery -s/p laparoscopic repair of strangulated left inguinal hernia with phasix mesh and short segment small bowel resection -Continue pain control as needed  Hypotension -in the setting of SVT overnight -Responded well to IVF -Currently BP stable -Discontinued amlodipine, cozaar, imdur  HTN -As above -Continued metoprolol for rate control and lasix for chronic diastolic CHF  LE edema -Was on On Eliquis. R>L -Currently held due to pericardial effusion -LE doppler negative for DVT  CAD -S/p coronary artery catheterization 03/26/2016 with angioplasty.  -EKG without evidence of ACS -Continue ASA -Cardiology consulted and appreciated  Chronic diastolic congestive heart failure -Last Echo showing EF of 55% and grade 1 diastolic dysfunction.  -Continue lasix -monitor intake/output, daily weights -Repeat echo pending  PAF/SVT  -Eliquis held -Continue metoprolol, cardizem discontinued   Hyperlipidemia -continue statin  DVT Prophylaxis  SCDs  Code Status: full  Family Communication: none at bedside  Disposition Plan: per primary team  Consultants [facility]  Procedures  laparoscopic repair of strangulated  left inguinal hernia with phasix mesh and short segment small bowel resection  Antibiotics   Anti-infectives    Start     Dose/Rate Route Frequency Ordered Stop   08/06/16 0600  ceFAZolin (ANCEF) IVPB 2g/100 mL premix  Status:  Discontinued     2 g 200 mL/hr over 30 Minutes Intravenous On call to O.R. 08/05/16 1219 08/05/16 1815   08/06/16 0600  metroNIDAZOLE (FLAGYL) IVPB 500 mg     500 mg 100 mL/hr over 60 Minutes Intravenous On call to O.R. 08/05/16 1417 08/05/16 1456   08/05/16 1445  ceFAZolin (ANCEF) IVPB 2g/100 mL premix     2 g 200 mL/hr over 30 Minutes Intravenous On call to O.R. 08/05/16 1417 08/05/16 1444      Subjective:   Gregory [name] seen and examined today.  Complains of abdominal soreness. Denies bowel movement or gas passage. Denies N/V, shortness of breath, chest pain.    Objective:   Vitals:   08/06/16 0231 08/06/16 0628 08/06/16 0941 08/06/16 1314  BP: 129/83 (!) 150/89 110/76 (!) 143/79  Pulse: (!) 113 (!) 110 (!) 176 100  Resp:  18  18  Temp: 98.1 F (36.7 C) 97.9 F (36.6 C) (!) 100.4 F (38 C) 98.5 F (36.9 C)  TempSrc: Oral Oral Oral Oral  SpO2: 94% 94% 96% 97%  Weight:      Height:        Intake/Output Summary (Last 24 hours) at 08/06/16 1455 Last data filed at 08/06/16 0630  Gross per 24 hour  Intake             2550 ml  Output             1290 ml  Net  1260 ml   Filed Weights   08/05/16 0738  Weight: 77.7 kg (171 lb 4.8 oz)    Exam  General: Well developed, well nourished, NAD, appears stated age  82: NCAT, mucous membranes moist.   Cardiovascular: S1 S2 auscultated,tachycardic  Respiratory: Clear to auscultation bilaterally with equal chest rise  Abdomen: Soft, nontender, nondistended, + bowel sounds. Bandages in place  Extremities: warm dry without cyanosis clubbing. LE edema  Neuro: AAOx3, nonfocal  Psych: Normal affect and demeanor with intact judgement and insight   Data Reviewed: I have personally  reviewed following labs and imaging studies  CBC:  Recent Labs Lab 08/04/16 0825 08/05/16 0726 08/06/16 0119  WBC 4.7 8.8 10.3  HGB 11.4* 11.9* 10.3*  HCT 38.0* 36.2* 31.8*  MCV 94.5 87.4 88.8  PLT 148 128* 123456*   Basic Metabolic Panel:  Recent Labs Lab 08/04/16 0825 08/05/16 0726 08/06/16 0119  NA 142 140 141  K 4.3 3.3* 3.6  CL 107 107 110  CO2 27 21* 23  GLUCOSE 90 159* 124*  BUN 18 12 11   CREATININE 0.67* 0.79 0.75  CALCIUM 9.1 9.7 8.7*   GFR: Estimated Creatinine Clearance: 73.2 mL/min (by C-G formula based on SCr of 0.75 mg/dL). Liver Function Tests:  Recent Labs Lab 08/04/16 0825 08/05/16 0726 08/06/16 0119  AST 29 36 28  ALT 15 22 15*  ALKPHOS 81 87 59  BILITOT 3.0* 4.0* 4.8*  PROT 6.5 7.1 5.4*  ALBUMIN 3.8 4.0 2.9*    Recent Labs Lab 08/05/16 0726  LIPASE 27   No results for input(s): AMMONIA in the last 168 hours. Coagulation Profile:  Recent Labs Lab 08/05/16 1214  INR 1.37   Cardiac Enzymes: No results for input(s): CKTOTAL, CKMB, CKMBINDEX, TROPONINI in the last 168 hours. BNP (last 3 results) No results for input(s): PROBNP in the last 8760 hours. HbA1C: No results for input(s): HGBA1C in the last 72 hours. CBG: No results for input(s): GLUCAP in the last 168 hours. Lipid Profile:  Recent Labs  08/04/16 0825  CHOL 85  HDL 46  LDLCALC 33  TRIG 28  CHOLHDL 1.8   Thyroid Function Tests:  Recent Labs  08/04/16 0825  TSH 1.74   Anemia Panel: No results for input(s): VITAMINB12, FOLATE, FERRITIN, TIBC, IRON, RETICCTPCT in the last 72 hours. Urine analysis:    Component Value Date/Time   COLORURINE YELLOW 08/05/2016 1107   APPEARANCEUR CLEAR 08/05/2016 1107   LABSPEC 1.044 (H) 08/05/2016 1107   PHURINE 6.0 08/05/2016 1107   GLUCOSEU 50 (A) 08/05/2016 1107   HGBUR NEGATIVE 08/05/2016 1107   BILIRUBINUR NEGATIVE 08/05/2016 1107   KETONESUR 20 (A) 08/05/2016 1107   PROTEINUR NEGATIVE 08/05/2016 1107    UROBILINOGEN 4.0 (H) 01/30/2014 1402   NITRITE NEGATIVE 08/05/2016 1107   LEUKOCYTESUR NEGATIVE 08/05/2016 1107   Sepsis Labs: @LABRCNTIP (procalcitonin:4,lacticidven:4)  )No results found for this or any previous visit (from the past 240 hour(s)).    Radiology Studies: Ct Abdomen Pelvis W Contrast  Result Date: 08/05/2016 CLINICAL DATA:  Upper abdominal pain and nausea EXAM: CT ABDOMEN AND PELVIS WITH CONTRAST TECHNIQUE: Multidetector CT imaging of the abdomen and pelvis was performed using the standard protocol following bolus administration of intravenous contrast. CONTRAST:  191mL ISOVUE-300 IOPAMIDOL (ISOVUE-300) INJECTION 61% COMPARISON:  03/30/2016. FINDINGS: Lower chest: Lung bases demonstrate some mild dependent atelectatic changes. A large pericardial effusion is identified which is increased in the interval from the prior MRI of 03/30/2016. It measures approximately 3.7 cm along the  posterior wall of the left ventricle. Hepatobiliary: Mild early heterogeneity to the liver is noted related to the timing of the contrast bolus. Multiple gallstones are seen without complicating factors. Pancreas: Unremarkable. No pancreatic ductal dilatation or surrounding inflammatory changes. Spleen: Normal in size without focal abnormality. Adrenals/Urinary Tract: Adrenal glands are unremarkable. Kidneys are normal, without renal calculi, focal lesion, or hydronephrosis. Bladder is decompressed. Stomach/Bowel: Scattered diverticular changes noted without evidence of diverticulitis. The appendix is not well visualized although no inflammatory changes to suggest appendicitis are seen. There is evidence of small bowel dilatation secondary to a left inguinal hernia. A loop of small bowel extends into the hernia and is within normal limits distal to this although obstructed proximally. Vascular/Lymphatic: Aortic atherosclerosis. No enlarged abdominal or pelvic lymph nodes. Reproductive: Prostate appears to have  been surgically removed. Other: No abdominal wall hernia or abnormality. No abdominopelvic ascites. Musculoskeletal: No acute or significant osseous findings. IMPRESSION: Left inguinal hernia containing a loop of small bowel with evidence of incarceration and proximal obstruction. Enlarging pericardial effusion as described above. Cholelithiasis without complicating factors. Diverticulosis without diverticulitis. Electronically Signed   By: Inez Catalina M.D.   On: 08/05/2016 09:29   Dg Chest Port 1 View  Result Date: 08/05/2016 CLINICAL DATA:  Peripheral effusion, shortness of breath EXAM: PORTABLE CHEST 1 VIEW COMPARISON:  CT chest 03/27/2016 FINDINGS: There is no focal parenchymal opacity. There is no pleural effusion or pneumothorax. The cardiac silhouette is enlarged likely reflecting a pericardial effusion given the prior exams. The osseous structures are unremarkable. IMPRESSION: 1. Enlarged cardiac silhouette likely reflecting a pericardial effusion. Electronically Signed   By: Kathreen Devoid   On: 08/05/2016 13:43     Scheduled Meds: . aspirin EC  81 mg Oral Daily  . atorvastatin  40 mg Oral Daily  . feeding supplement  1 Container Oral TID  . metoprolol  50 mg Oral BID  . pantoprazole (PROTONIX) IV  40 mg Intravenous QHS   Continuous Infusions:   LOS: 1 day   Time Spent in minutes   30 minutes  Eiliana Drone D.O. on 08/06/2016 at 2:55 PM  Between 7am to 7pm - Pager - [phone]  After 7pm go to [email or web address] - password [email or web address]  And look for the night coverage person covering for me after hours  [facility] Office  [phone]

## 2016-08-06 NOTE — Progress Notes (Signed)
NP paged because pt was hypotensive in the 80-90 range and tachycardic in the 160s. Pt without chest pain, SOB, or palpitations.  Afebrile.  NP first notified surgeon who ordered a bolus and asked RN to page [facility].   Brief hx: Pt with incarcerated hernia and is s/p Lap hernia repair 12/21. Pt with known hx CAD with NSTEMI and grade I diastolic dysfunction as documented on echo 8/17. EF 55-60%.  NP ordered another 250cc bolus and 12 lead EKG. EKG showed ST with PVCs, RBBB (which was present on 8/17 EKG).  BP now up to high 120s and HR down to 100s. Obtain stat CMP and CBC to assess postop Hgb and electrolytes.    KJKG, NP [facility]

## 2016-08-06 NOTE — Progress Notes (Signed)
Patient HR remains high 170's. [facility] MD and surgery aware. Awaiting for cardiology to call for further instruction.

## 2016-08-06 NOTE — Interval H&P Note (Signed)
History and Physical Interval Note:  08/06/2016 5:19 PM  Gregory [name]  has presented today for surgery, with the diagnosis of pericarditis  The various methods of treatment have been discussed with the patient and family. After consideration of risks, benefits and other options for treatment, the patient has consented to  Procedure(s): Pericardiocentesis (N/A) as a surgical intervention .  The patient's history has been reviewed, patient examined, no change in status, stable for surgery.  I have reviewed the patient's chart and labs.  Questions were answered to the patient's satisfaction.    Reviewed echo films. Discussed case with Dr Stanford Breed at length. Plan for pericardiocentesis.   Sherren Mocha

## 2016-08-06 NOTE — Progress Notes (Signed)
Patient to cardiac cath

## 2016-08-06 NOTE — Progress Notes (Signed)
Initial Nutrition Assessment  DOCUMENTATION CODES:   Non-severe (moderate) malnutrition in context of chronic illness  INTERVENTION:  Boost Breeze po TID, each supplement provides 250 kcal and 9 grams of protein Provide 30 ml Pro-stat BID, each dose provides 15 grams of protein and 100 kcal Multivitamin with minerals daily  Change supplement to Ensure Enlive po BID when diet is advanced, each supplement provides 350 kcal and 20 grams of protein    NUTRITION DIAGNOSIS:   Malnutrition related to chronic illness, poor appetite as evidenced by energy intake < 75% for > or equal to 1 month, mild depletion of body fat, moderate depletions of muscle mass.   GOAL:   Patient will meet greater than or equal to 90% of their needs   MONITOR:   Diet advancement, Supplement acceptance, PO intake, Labs, Weight trends, Skin, I & O's  REASON FOR ASSESSMENT:   Malnutrition Screening Tool    ASSESSMENT:   Pt with incarcerated hernia and is s/p Lap hernia repair 12/21. Pt with known hx CAD, CHF, HTN  Pt states that he has had a poor appetite and has been eating 50% less than usual for the past 6 months. He reports losing 15-20 lbs during this time. He denies any nausea, vomiting, or abdominal pain contributing to poor appetite. Today, he reports some abdominal pain at incision site with movement. Pt has moderate muscle wasting and mild fat wasting per nutrition-focused physical exam. Per weight history, weight loss has occurred over > 1 year. RD discussed the importance of adequate, consistent, healthful intake of food especially after surgery. Encouraged frequent meals/snacks and intake of nutritional supplements to promote weight maintenance and healing. Pt agreeable to receiving Ensure/supplements. Pt currently NPO with Boost Breeze ordered TID.   Labs: low calcium, low hemoglobin, low potassium  Diet Order:  Diet NPO time specified  Skin:  Wound (see comment) (closed abdominal  incision)  Last BM:  12/19  Height:   Ht Readings from Last 1 Encounters:  08/05/16 5\' 11"  (1.803 m)    Weight:   Wt Readings from Last 1 Encounters:  08/05/16 171 lb 4.8 oz (77.7 kg)    Ideal Body Weight:  78.2 kg  BMI:  Body mass index is 23.89 kg/m.  Estimated Nutritional Needs:   Kcal:  1900-2100  Protein:  95-105 grams  Fluid:  1.9 L/day  EDUCATION NEEDS:   No education needs identified at this time  [place], CSP, LDN Inpatient Clinical Dietitian Pager: [phone] After Hours Pager: [phone]

## 2016-08-06 NOTE — Progress Notes (Signed)
Echo reviewed; large pericardial effusion with evidence of tamponade physiology; he has had hypotension in setting of SVT but effusion could be contributing; his apixaban has been held; discussed with Dr Burt Knack; will plan pericardiocentesis. Discussed with Tobie Lords, patient's son. 30 min spent reviewing pts echo, discussing with pt and son. Kirk Ruths, MD

## 2016-08-06 NOTE — Progress Notes (Addendum)
Patient ID: Gregory [name], male   DOB: 1929/12/15, 82 y.o.   MRN: [id number] Notified by RN of HR 160 and SBP 90. EKG, labs and 250cc bolus ordered. I also asked that the hospitalist service be contacted since they are consulting to manage all of his complex medical problems.  Abd soft, incision CDI EKG: sinus tach, RBB, some fusion complexes. TR[facility] has ordered an additional bolus. BP now 125/82, HR109 Labs Rubbie Battiest, MD, MPH, FACS Trauma: [phone] General Surgery: [phone]

## 2016-08-06 NOTE — Progress Notes (Addendum)
S: tachycardia/BP responsive to fluid bolus overnight. Feels sore this morning but denies nausea. No flatus.   Vitals, labs, intake/output, and orders reviewed at this time.  Gen: A&Ox3, no distress  H&N: EOMI, atraumatic, neck supple Chest: unlabored respirations, RRR Abd: soft, appropriately tender around incisions and in left groin, nondistended, OR dressings intact with some shadowing.  Ext: warm, BLEedema Neuro: grossly normal  Lines/tubes/drains: PIV, foley  A/P:  POD 1 s/p laparoscopic repair of strangulated left inguinal hernia with phasix mesh and short segment small bowel resection, doing well this morning - Ice chips and Breeze TID, but otherwise await return of bowel function -Ok to get out of bed to chair, pulmonary toilet -We are so appreciative of assistance from IM and cardiology in managing his complex medical history   Romana Juniper, MD [facility], [place] Pager [phone]

## 2016-08-06 NOTE — Progress Notes (Signed)
[facility] Progress Note  1 Day Post-Op  Subjective: Abdominal soreness, controlled with medications. Denies nausea or vomiting. Denies flatus or BM.   Pt just returned from an echocardiogram - currently HR 170-180; paged cardiology.  Objective: Vital signs in last 24 hours: Temp:  [97.9 F (36.6 C)-99.5 F (37.5 C)] 97.9 F (36.6 C) (12/22 0628) Pulse Rate:  [95-163] 110 (12/22 0628) Resp:  [15-26] 18 (12/22 0628) BP: (90-169)/(72-105) 150/89 (12/22 0628) SpO2:  [90 %-100 %] 94 % (12/22 0628) Last BM Date: 08/03/16  Intake/Output from previous day: 12/21 0701 - 12/22 0700 In: 2550 [I.V.:2550] Out: 1290 [Urine:1040; Blood:50] Intake/Output this shift: No intake/output data recorded.  PE: Gen:  Alert, NAD, pleasant and cooperative HEENT: nasal cannula in place Card:  Tachycardic 175bpm - did not decrease with carotid massage.  Pulm:  Mildly labored, diminished breath sounds in bilateral lung bases. Possible crackles at left lung base Abd: Soft, NT, mild distention, +BS, no HSM, incisions C/D/I - central incision with some dried blood.   Lab Results:   Recent Labs  08/05/16 0726 08/06/16 0119  WBC 8.8 10.3  HGB 11.9* 10.3*  HCT 36.2* 31.8*  PLT 128* 119*   BMET  Recent Labs  08/05/16 0726 08/06/16 0119  NA 140 141  K 3.3* 3.6  CL 107 110  CO2 21* 23  GLUCOSE 159* 124*  BUN 12 11  CREATININE 0.79 0.75  CALCIUM 9.7 8.7*   PT/INR  Recent Labs  08/05/16 1214  LABPROT 17.0*  INR 1.37   CMP     Component Value Date/Time   NA 141 08/06/2016 0119   K 3.6 08/06/2016 0119   CL 110 08/06/2016 0119   CO2 23 08/06/2016 0119   GLUCOSE 124 (H) 08/06/2016 0119   BUN 11 08/06/2016 0119   CREATININE 0.75 08/06/2016 0119   CREATININE 0.67 (L) 1October 05, 202017 0825   CALCIUM 8.7 (L) 08/06/2016 0119   PROT 5.4 (L) 08/06/2016 0119   ALBUMIN 2.9 (L) 08/06/2016 0119   AST 28 08/06/2016 0119   ALT 15 (L) 08/06/2016 0119   ALKPHOS 59 08/06/2016 0119    BILITOT 4.8 (H) 08/06/2016 0119   GFRNONAA >60 08/06/2016 0119   GFRAA >60 08/06/2016 0119   Lipase     Component Value Date/Time   LIPASE 27 08/05/2016 0726       Studies/Results: Ct Abdomen Pelvis W Contrast  Result Date: 08/05/2016 CLINICAL DATA:  Upper abdominal pain and nausea EXAM: CT ABDOMEN AND PELVIS WITH CONTRAST TECHNIQUE: Multidetector CT imaging of the abdomen and pelvis was performed using the standard protocol following bolus administration of intravenous contrast. CONTRAST:  138mL ISOVUE-300 IOPAMIDOL (ISOVUE-300) INJECTION 61% COMPARISON:  03/30/2016. FINDINGS: Lower chest: Lung bases demonstrate some mild dependent atelectatic changes. A large pericardial effusion is identified which is increased in the interval from the prior MRI of 03/30/2016. It measures approximately 3.7 cm along the posterior wall of the left ventricle. Hepatobiliary: Mild early heterogeneity to the liver is noted related to the timing of the contrast bolus. Multiple gallstones are seen without complicating factors. Pancreas: Unremarkable. No pancreatic ductal dilatation or surrounding inflammatory changes. Spleen: Normal in size without focal abnormality. Adrenals/Urinary Tract: Adrenal glands are unremarkable. Kidneys are normal, without renal calculi, focal lesion, or hydronephrosis. Bladder is decompressed. Stomach/Bowel: Scattered diverticular changes noted without evidence of diverticulitis. The appendix is not well visualized although no inflammatory changes to suggest appendicitis are seen. There is evidence of small bowel dilatation secondary to a left inguinal  hernia. A loop of small bowel extends into the hernia and is within normal limits distal to this although obstructed proximally. Vascular/Lymphatic: Aortic atherosclerosis. No enlarged abdominal or pelvic lymph nodes. Reproductive: Prostate appears to have been surgically removed. Other: No abdominal wall hernia or abnormality. No  abdominopelvic ascites. Musculoskeletal: No acute or significant osseous findings. IMPRESSION: Left inguinal hernia containing a loop of small bowel with evidence of incarceration and proximal obstruction. Enlarging pericardial effusion as described above. Cholelithiasis without complicating factors. Diverticulosis without diverticulitis. Electronically Signed   By: Inez Catalina M.D.   On: 08/05/2016 09:29   Dg Chest Port 1 View  Result Date: 08/05/2016 CLINICAL DATA:  Peripheral effusion, shortness of breath EXAM: PORTABLE CHEST 1 VIEW COMPARISON:  CT chest 03/27/2016 FINDINGS: There is no focal parenchymal opacity. There is no pleural effusion or pneumothorax. The cardiac silhouette is enlarged likely reflecting a pericardial effusion given the prior exams. The osseous structures are unremarkable. IMPRESSION: 1. Enlarged cardiac silhouette likely reflecting a pericardial effusion. Electronically Signed   By: Kathreen Devoid   On: 08/05/2016 13:43    Anti-infectives: Anti-infectives    Start     Dose/Rate Route Frequency Ordered Stop   08/06/16 0600  ceFAZolin (ANCEF) IVPB 2g/100 mL premix  Status:  Discontinued     2 g 200 mL/hr over 30 Minutes Intravenous On call to O.R. 08/05/16 1219 08/05/16 1815   08/06/16 0600  metroNIDAZOLE (FLAGYL) IVPB 500 mg     500 mg 100 mL/hr over 60 Minutes Intravenous On call to O.R. 08/05/16 1417 08/05/16 1456   08/05/16 1445  ceFAZolin (ANCEF) IVPB 2g/100 mL premix     2 g 200 mL/hr over 30 Minutes Intravenous On call to O.R. 08/05/16 1417 08/05/16 1444     Assessment/Plan strangulated small bowel within left inguinal hernia S/P Diagnostic laparoscopy, reduction of strangulated small bowel from left inguinal hernia, TAPP repair with phasix mesh, small bowel resection, 08/05/16 Dr. Romana Juniper  POD#1  Continue NPO except limited ice chips   Low-grade temps - 100.4, Tylenol PRN  Pain control  IS/pulm toilet  Await return of bowel function  Pericardial  effusion - increased on CT this admission compared to echo 03/2016 >> repeat echo today SVT post-operatively - on metoprolol ; patient asymptomatic - mild SOB. I ordered 5 mg IV lopressor and MD to evaluate.  Atrial fibrillation anticoagulated on eliquis - held, last dose 08/04/16 HTN LE Edema - duplex negative for DVT CAD - s/p coronary artery cath w/ angioplasty [id number] Chronic diastolic CHF - IV lasix HLD    FEN: NPO, IVF ID: perioperative Ancef x1 dose VTE: SCD's, anticoagulation held per cardiology recommendations  Plan: bowel rest, await return of bowel function  Pain control and IS  Rate control per cardiology      LOS: 1 day    Jill Alexanders , [facility] 08/06/2016, 8:53 AM Pager: [phone] Consults: [phone] Mon-Fri 7:00 am-4:30 pm Sat-Sun 7:00 am-11:30 am

## 2016-08-06 NOTE — Anesthesia Postprocedure Evaluation (Addendum)
Anesthesia Post Note  Patient: Gregory [name]  Procedure(s) Performed: Procedure(s) (LRB): LAPAROSCOPY DIAGNOSTIC AND LAPAROSCOPIC INGUINAL HERNIA REPAIR. (N/A) EXPLORATORYLAPAROTOMY WITH  SMALL BOWEL RESECTION. (N/A)  Patient location during evaluation: PACU Anesthesia Type: General Level of consciousness: awake and alert Pain management: pain level controlled Vital Signs Assessment: post-procedure vital signs reviewed and stable Respiratory status: spontaneous breathing, nonlabored ventilation, respiratory function stable and patient connected to nasal cannula oxygen Cardiovascular status: stable Postop Assessment: no signs of nausea or vomiting Anesthetic complications: no Comments: Back in afib with rate tachy but stable in setting of acute illness, patient on beta blocker and followed pre and post op by Cardiology for chronic paroxysmal afib and cad, denies CP       Last Vitals:  Vitals:   08/06/16 0231 08/06/16 0628  BP: 129/83 (!) 150/89  Pulse: (!) 113 (!) 110  Resp:  18  Temp: 36.7 C 36.6 C    Last Pain:  Vitals:   08/06/16 0757  TempSrc:   PainSc: 9                  Alvilda Mckenna

## 2016-08-06 NOTE — Progress Notes (Signed)
Notified Dr Grandville Silos patient's HR in the 160s and BP 90/72 in L arm and SBP in 80s in R arm and L leg. Dr Grandville Silos stated to give 250cc NS bolus, order STAT CBC and BMP, and to notify [facility].

## 2016-08-07 DIAGNOSIS — I314 Cardiac tamponade: Secondary | ICD-10-CM

## 2016-08-07 DIAGNOSIS — I5032 Chronic diastolic (congestive) heart failure: Secondary | ICD-10-CM

## 2016-08-07 DIAGNOSIS — I2510 Atherosclerotic heart disease of native coronary artery without angina pectoris: Secondary | ICD-10-CM

## 2016-08-07 LAB — CBC
HCT: 30.8 % — ABNORMAL LOW (ref 39.0–52.0)
Hemoglobin: 10.2 g/dL — ABNORMAL LOW (ref 13.0–17.0)
MCH: 29.2 pg (ref 26.0–34.0)
MCHC: 33.1 g/dL (ref 30.0–36.0)
MCV: 88.3 fL (ref 78.0–100.0)
PLATELETS: 102 10*3/uL — AB (ref 150–400)
RBC: 3.49 MIL/uL — ABNORMAL LOW (ref 4.22–5.81)
RDW: 15.5 % (ref 11.5–15.5)
WBC: 7.6 10*3/uL (ref 4.0–10.5)

## 2016-08-07 LAB — BASIC METABOLIC PANEL
Anion gap: 8 (ref 5–15)
BUN: 21 mg/dL — ABNORMAL HIGH (ref 6–20)
CALCIUM: 8.5 mg/dL — AB (ref 8.9–10.3)
CO2: 24 mmol/L (ref 22–32)
CREATININE: 0.8 mg/dL (ref 0.61–1.24)
Chloride: 109 mmol/L (ref 101–111)
GFR calc non Af Amer: >60 mL/min (ref >60)
GLUCOSE: 101 mg/dL — AB (ref 65–99)
Potassium: 3.9 mmol/L (ref 3.5–5.1)
Sodium: 141 mmol/L (ref 135–145)

## 2016-08-07 NOTE — Progress Notes (Signed)
Subjective:  No CP/SOM. Pericardial drain remains in place. Drainage 160cc over past 12 hours  Objective:  Temp:  [98.3 F (36.8 C)-99.5 F (37.5 C)] 98.3 F (36.8 C) (12/23 0800) Pulse Rate:  [89-133] 106 (12/23 1019) Resp:  [12-26] 21 (12/23 0900) BP: (107-163)/(66-98) 123/75 (12/23 1019) SpO2:  [93 %-97 %] 96 % (12/23 0900) Weight change:   Intake/Output from previous day: 12/22 0701 - 12/23 0700 In: -  Out: 1260 [Urine:1100; Drains:160]  Intake/Output from this shift: Total I/O In: -  Out: 125 [Urine:125]  Physical Exam: General appearance: alert and no distress Neck: no adenopathy, no carotid bruit, no JVD, supple, symmetrical, trachea midline and thyroid not enlarged, symmetric, no tenderness/mass/nodules Lungs: clear to auscultation bilaterally Heart: SR with FR Extremities: extremities normal, atraumatic, no cyanosis or edema  Lab Results: Results for orders placed or performed during the hospital encounter of 08/05/16 (from the past 48 hour(s))  Urinalysis, Routine w reflex microscopic     Status: Abnormal   Collection Time: 08/05/16 11:07 AM  Result Value Ref Range   Color, Urine YELLOW YELLOW   APPearance CLEAR CLEAR   Specific Gravity, Urine 1.044 (H) 1.005 - 1.030   pH 6.0 5.0 - 8.0   Glucose, UA 50 (A) NEGATIVE mg/dL   Hgb urine dipstick NEGATIVE NEGATIVE   Bilirubin Urine NEGATIVE NEGATIVE   Ketones, ur 20 (A) NEGATIVE mg/dL   Protein, ur NEGATIVE NEGATIVE mg/dL   Nitrite NEGATIVE NEGATIVE   Leukocytes, UA NEGATIVE NEGATIVE  APTT     Status: None   Collection Time: 08/05/16 12:14 PM  Result Value Ref Range   aPTT 33 24 - 36 seconds  Protime-INR     Status: Abnormal   Collection Time: 08/05/16 12:14 PM  Result Value Ref Range   Prothrombin Time 17.0 (H) 11.4 - 15.2 seconds   INR 1.37   CBC     Status: Abnormal   Collection Time: 08/06/16  1:19 AM  Result Value Ref Range   WBC 10.3 4.0 - 10.5 K/uL   RBC 3.58 (L) 4.22 - 5.81 MIL/uL   Hemoglobin 10.3 (L) 13.0 - 17.0 g/dL   HCT 31.8 (L) 39.0 - 52.0 %   MCV 88.8 78.0 - 100.0 fL   MCH 28.8 26.0 - 34.0 pg   MCHC 32.4 30.0 - 36.0 g/dL   RDW 15.7 (H) 11.5 - 15.5 %   Platelets 119 (L) 150 - 400 K/uL    Comment: SPECIMEN CHECKED FOR CLOTS REPEATED TO VERIFY PLATELET COUNT CONFIRMED BY SMEAR   Comprehensive metabolic panel     Status: Abnormal   Collection Time: 08/06/16  1:19 AM  Result Value Ref Range   Sodium 141 135 - 145 mmol/L   Potassium 3.6 3.5 - 5.1 mmol/L   Chloride 110 101 - 111 mmol/L   CO2 23 22 - 32 mmol/L   Glucose, Bld 124 (H) 65 - 99 mg/dL   BUN 11 6 - 20 mg/dL   Creatinine, Ser 0.75 0.61 - 1.24 mg/dL   Calcium 8.7 (L) 8.9 - 10.3 mg/dL   Total Protein 5.4 (L) 6.5 - 8.1 g/dL   Albumin 2.9 (L) 3.5 - 5.0 g/dL   AST 28 15 - 41 U/L   ALT 15 (L) 17 - 63 U/L   Alkaline Phosphatase 59 38 - 126 U/L   Total Bilirubin 4.8 (H) 0.3 - 1.2 mg/dL   GFR calc non Af Amer >60 >60 mL/min   GFR calc Af Amer >  60 >60 mL/min    Comment: (NOTE) The eGFR has been calculated using the CKD EPI equation. This calculation has not been validated in all clinical situations. eGFR's persistently <60 mL/min signify possible Chronic Kidney Disease.    Anion gap 8 5 - 15  Creatinine, body fluid     Status: None   Collection Time: 08/06/16  4:56 PM  Result Value Ref Range   Creat, Fluid 0.8 mg/dL    Comment: ORDERED IN ERROR SENT TO [facility] (NOTE) No normal range established for this test Results should be evaluated in conjunction with serum values   Body fluid cell count with differential     Status: Abnormal   Collection Time: 08/06/16  4:56 PM  Result Value Ref Range   Fluid Type-FCT PERICARDIAL    Color, Fluid RED (A) YELLOW   Appearance, Fluid CLOUDY (A) CLEAR   WBC, Fluid 138 0 - 1,000 cu mm   Lymphs, Fluid 83 %   Monocyte-Macrophage-Serous Fluid 17 (L) 50 - 90 %   Other Cells, Fluid PIGMENTED LADEN MACROPHAGES %  Body fluid culture     Status: None (Preliminary  result)   Collection Time: 08/06/16  4:56 PM  Result Value Ref Range   Specimen Description PERICARDIAL    Special Requests NONE    Gram Stain      RARE WBC PRESENT, PREDOMINANTLY MONONUCLEAR NO ORGANISMS SEEN    Culture NO GROWTH 1 DAY    Report Status PENDING   Glucose, random     Status: Abnormal   Collection Time: 08/06/16  7:08 PM  Result Value Ref Range   Glucose, Bld 107 (H) 65 - 99 mg/dL  Basic metabolic panel     Status: Abnormal   Collection Time: 08/07/16  3:34 AM  Result Value Ref Range   Sodium 141 135 - 145 mmol/L   Potassium 3.9 3.5 - 5.1 mmol/L   Chloride 109 101 - 111 mmol/L   CO2 24 22 - 32 mmol/L   Glucose, Bld 101 (H) 65 - 99 mg/dL   BUN 21 (H) 6 - 20 mg/dL   Creatinine, Ser 0.80 0.61 - 1.24 mg/dL   Calcium 8.5 (L) 8.9 - 10.3 mg/dL   GFR calc non Af Amer >60 >60 mL/min   GFR calc Af Amer >60 >60 mL/min    Comment: (NOTE) The eGFR has been calculated using the CKD EPI equation. This calculation has not been validated in all clinical situations. eGFR's persistently <60 mL/min signify possible Chronic Kidney Disease.    Anion gap 8 5 - 15  CBC     Status: Abnormal   Collection Time: 08/07/16  3:34 AM  Result Value Ref Range   WBC 7.6 4.0 - 10.5 K/uL   RBC 3.49 (L) 4.22 - 5.81 MIL/uL   Hemoglobin 10.2 (L) 13.0 - 17.0 g/dL   HCT 30.8 (L) 39.0 - 52.0 %   MCV 88.3 78.0 - 100.0 fL   MCH 29.2 26.0 - 34.0 pg   MCHC 33.1 30.0 - 36.0 g/dL   RDW 15.5 11.5 - 15.5 %   Platelets 102 (L) 150 - 400 K/uL    Comment: CONSISTENT WITH PREVIOUS RESULT    Imaging: Imaging results have been reviewed  Assessment/Plan:   1. Active Problems: 2.   Hypertension 3.   Hyperlipidemia 4.   PAF (paroxysmal atrial fibrillation) ([place]) 5.   CAD (coronary artery disease) 6.   Chronic diastolic congestive heart failure ([place]) 7.   Incarcerated inguinal hernia 8.  Incarcerated left inguinal hernia 9.   Preop cardiovascular exam 10.   Malnutrition of moderate degree 11.    Hypotension 12.   Cardiac tamponade 13.   Time Spent Directly with Patient:  20 minutes  Length of Stay:  LOS: 2 days   S/P subxiphoid pericardial drain placed yesterday under fluoroscopic guidance . W/D 1 L serosanguinous fluid. Rhythm ST with PVCs. Drained 160cc over 12 hour shift. Has pericardial FR on exam. Will get 2D to document improvement in effusion then prob D/C drain tomorrow. Will prob wait to restart NOAC for PAF.  [name] 08/07/2016, 10:40 AM

## 2016-08-07 NOTE — Progress Notes (Signed)
1 Day Post-Op   Subjective: Having some lower abdominal soreness but no severe pain. Sipping on water and ice chips. Denies nausea or vomiting.  Objective: Vital signs in last 24 hours: Temp:  [98.3 F (36.8 C)-99.5 F (37.5 C)] 98.3 F (36.8 C) (12/23 0800) Pulse Rate:  [89-133] 106 (12/23 1019) Resp:  [12-26] 21 (12/23 0900) BP: (107-163)/(66-98) 123/75 (12/23 1019) SpO2:  [93 %-97 %] 96 % (12/23 0900) Last BM Date: 08/03/16  Intake/Output from previous day: 12/22 0701 - 12/23 0700 In: -  Out: 1260 [Urine:1100; Drains:160] Intake/Output this shift: Total I/O In: -  Out: 125 [Urine:125]  General appearance: alert, cooperative, no distress and Frail elderly gentleman GI: Nondistended. Soft with minimal appropriate tenderness. Male genitalia: normal, Significant penile and scrotal edema Incision/Wound: Clean and dry  Lab Results:   Recent Labs  08/06/16 0119 08/07/16 0334  WBC 10.3 7.6  HGB 10.3* 10.2*  HCT 31.8* 30.8*  PLT 119* 102*   BMET  Recent Labs  08/06/16 0119 08/06/16 1908 08/07/16 0334  NA 141  --  141  K 3.6  --  3.9  CL 110  --  109  CO2 23  --  24  GLUCOSE 124* 107* 101*  BUN 11  --  21*  CREATININE 0.75  --  0.80  CALCIUM 8.7*  --  8.5*     Studies/Results: Dg Chest Port 1 View  Result Date: 08/05/2016 CLINICAL DATA:  Peripheral effusion, shortness of breath EXAM: PORTABLE CHEST 1 VIEW COMPARISON:  CT chest 03/27/2016 FINDINGS: There is no focal parenchymal opacity. There is no pleural effusion or pneumothorax. The cardiac silhouette is enlarged likely reflecting a pericardial effusion given the prior exams. The osseous structures are unremarkable. IMPRESSION: 1. Enlarged cardiac silhouette likely reflecting a pericardial effusion. Electronically Signed   By: Kathreen Devoid   On: 08/05/2016 13:43    Anti-infectives: Anti-infectives    Start     Dose/Rate Route Frequency Ordered Stop   08/06/16 0600  ceFAZolin (ANCEF) IVPB 2g/100 mL  premix  Status:  Discontinued     2 g 200 mL/hr over 30 Minutes Intravenous On call to O.R. 08/05/16 1219 08/05/16 1815   08/06/16 0600  metroNIDAZOLE (FLAGYL) IVPB 500 mg     500 mg 100 mL/hr over 60 Minutes Intravenous On call to O.R. 08/05/16 1417 08/05/16 1456   08/05/16 1445  ceFAZolin (ANCEF) IVPB 2g/100 mL premix     2 g 200 mL/hr over 30 Minutes Intravenous On call to O.R. 08/05/16 1417 08/05/16 1444      Assessment/Plan: s/p Procedure(s): Laparoscopic repair of incarcerated inguinal hernia with small bowel resection Multiple medical problems. Seems to be doing well from abdominal surgical standpoint. Start clear liquid diet. Out of bed to chair. Pericardiocentesis    LOS: 2 days    [name] T 12/23/2017Patient ID: Gregory [name], male   DOB: 1930/05/09, 82 y.o.   MRN: [id number]

## 2016-08-07 NOTE — Progress Notes (Signed)
PROGRESS NOTE    Gregory [name]  [id number] DOB: Apr 26, 1930 DOA: 08/05/2016 PCP: [name], MD   Chief Complaint  Patient presents with  . Abdominal Pain    Brief Narrative:  82 y.o. male presented with Abdominal pain, nausea, vomiting. Started 1 day ago. Constant with waxing and waning nature. Getting worse. Nonradiating. Has not tried anything for relief. Last bowel movement 1 day ago. Last dose about course Wednesday morning at approximately 8 AM. Poor oral intake since that time. Denies any chest pain, palpitations, shortness of breath, fever, back pain, dysuria, frequency, neck stiffness, headache, LOC, melena, hematochezia.  Consult medical management  Assessment & Plan   Incarcerated Inguinal Hernia -Per primary team, general surgery -s/p laparoscopic repair of strangulated left inguinal hernia with phasix mesh and short segment small bowel resection -Continue pain control as needed  Pericardial effusion/tamponade -Cardiology consulted and appreciated -Echocardiogram EF65-70%, large pericardial effusion, incrases RV-LV interaction- consistent with tamponade physilogy -S/p pericardiocentesis, with drain placement (1L removed, followed by 160cc over12 hours) -Plan for repeat echo and possible drain removal on 12/24  Hypotension -in the setting of SVT overnight -Responded well to IVF -Currently BP stable -Discontinued amlodipine, cozaar, imdur  HTN -As above -Continued metoprolol for rate control and lasix for chronic diastolic CHF  LE edema -Was on On Eliquis. R>L -Currently held due to pericardial effusion -LE doppler negative for DVT  CAD -S/p coronary artery catheterization 03/26/2016 with angioplasty.  -EKG without evidence of ACS -Continue ASA -Cardiology consulted and appreciated  Chronic diastolic congestive heart failure -Last Echo showing EF of 55% and grade 1 diastolic dysfunction.  -Continue lasix -monitor intake/output, daily  weights -Repeat echo pending  PAF/SVT  -Eliquis held -Continue metoprolol, cardizem discontinued   Hyperlipidemia -continue statin  DVT Prophylaxis  SCDs  Code Status: full  Family Communication: none at bedside  Disposition Plan: per primary team  Consultants [facility] Cardiology  Procedures  laparoscopic repair of strangulated left inguinal hernia with phasix mesh and short segment small bowel resection Pericardiocentesis   Antibiotics   Anti-infectives    Start     Dose/Rate Route Frequency Ordered Stop   08/06/16 0600  ceFAZolin (ANCEF) IVPB 2g/100 mL premix  Status:  Discontinued     2 g 200 mL/hr over 30 Minutes Intravenous On call to O.R. 08/05/16 1219 08/05/16 1815   08/06/16 0600  metroNIDAZOLE (FLAGYL) IVPB 500 mg     500 mg 100 mL/hr over 60 Minutes Intravenous On call to O.R. 08/05/16 1417 08/05/16 1456   08/05/16 1445  ceFAZolin (ANCEF) IVPB 2g/100 mL premix     2 g 200 mL/hr over 30 Minutes Intravenous On call to O.R. 08/05/16 1417 08/05/16 1444      Subjective:   Gregory [name] seen and examined today.  Complains of abdominal soreness. Denies chest pain, shortness of breath, bowel movement or gas passage.  Objective:   Vitals:   08/07/16 0900 08/07/16 1000 08/07/16 1019 08/07/16 1100  BP: 117/74 123/75 123/75 127/79  Pulse: 99 (!) 101 (!) 106 (!) 102  Resp: (!) 21 (!) 25  (!) 23  Temp:      TempSrc:      SpO2: 96% 96%  96%  Weight:      Height:        Intake/Output Summary (Last 24 hours) at 08/07/16 1210 Last data filed at 08/07/16 1100  Gross per 24 hour  Intake  0 ml  Output             1535 ml  Net            -1535 ml   Filed Weights   08/05/16 0738  Weight: 77.7 kg (171 lb 4.8 oz)    Exam  General: Well developed, well nourished, NAD, appears stated age  HEENT: NCAT, mucous membranes moist.   Cardiovascular: S1 S2 auscultated,tachycardic  Respiratory: Clear to auscultation bilaterally with equal chest  rise  Abdomen: Soft, nontender, nondistended, + bowel sounds. Bandages in place. Drain placed   Extremities: warm dry without cyanosis clubbing. LE edema  Neuro: AAOx3, nonfocal  Psych: appropriate    Data Reviewed: I have personally reviewed following labs and imaging studies  CBC:  Recent Labs Lab 08/04/16 0825 08/05/16 0726 08/06/16 0119 08/07/16 0334  WBC 4.7 8.8 10.3 7.6  HGB 11.4* 11.9* 10.3* 10.2*  HCT 38.0* 36.2* 31.8* 30.8*  MCV 94.5 87.4 88.8 88.3  PLT 148 128* 119* [id number]*   Basic Metabolic Panel:  Recent Labs Lab 08/04/16 0825 08/05/16 0726 08/06/16 0119 08/06/16 1908 08/07/16 0334  NA 142 140 141  --  141  K 4.3 3.3* 3.6  --  3.9  CL 107 107 110  --  109  CO2 27 21* 23  --  24  GLUCOSE 90 159* 124* 107* 101*  BUN 18 12 11   --  21*  CREATININE 0.67* 0.79 0.75  --  0.80  CALCIUM 9.1 9.7 8.7*  --  8.5*   GFR: Estimated Creatinine Clearance: 73.2 mL/min (by C-G formula based on SCr of 0.8 mg/dL). Liver Function Tests:  Recent Labs Lab 08/04/16 0825 08/05/16 0726 08/06/16 0119  AST 29 36 28  ALT 15 22 15*  ALKPHOS 81 87 59  BILITOT 3.0* 4.0* 4.8*  PROT 6.5 7.1 5.4*  ALBUMIN 3.8 4.0 2.9*    Recent Labs Lab 08/05/16 0726  LIPASE 27   No results for input(s): AMMONIA in the last 168 hours. Coagulation Profile:  Recent Labs Lab 08/05/16 1214  INR 1.37   Cardiac Enzymes: No results for input(s): CKTOTAL, CKMB, CKMBINDEX, TROPONINI in the last 168 hours. BNP (last 3 results) No results for input(s): PROBNP in the last 8760 hours. HbA1C: No results for input(s): HGBA1C in the last 72 hours. CBG: No results for input(s): GLUCAP in the last 168 hours. Lipid Profile: No results for input(s): CHOL, HDL, LDLCALC, TRIG, CHOLHDL, LDLDIRECT in the last 72 hours. Thyroid Function Tests: No results for input(s): TSH, T4TOTAL, FREET4, T3FREE, THYROIDAB in the last 72 hours. Anemia Panel: No results for input(s): VITAMINB12, FOLATE, FERRITIN,  TIBC, IRON, RETICCTPCT in the last 72 hours. Urine analysis:    Component Value Date/Time   COLORURINE YELLOW 08/05/2016 1107   APPEARANCEUR CLEAR 08/05/2016 1107   LABSPEC 1.044 (H) 08/05/2016 1107   PHURINE 6.0 08/05/2016 1107   GLUCOSEU 50 (A) 08/05/2016 1107   HGBUR NEGATIVE 08/05/2016 1107   BILIRUBINUR NEGATIVE 08/05/2016 1107   KETONESUR 20 (A) 08/05/2016 1107   PROTEINUR NEGATIVE 08/05/2016 1107   UROBILINOGEN 4.0 (H) 01/30/2014 1402   NITRITE NEGATIVE 08/05/2016 1107   LEUKOCYTESUR NEGATIVE 08/05/2016 1107   Sepsis Labs: @LABRCNTIP (procalcitonin:4,lacticidven:4)  ) Recent Results (from the past 240 hour(s))  Body fluid culture     Status: None (Preliminary result)   Collection Time: 08/06/16  4:56 PM  Result Value Ref Range Status   Specimen Description PERICARDIAL  Final   Special Requests NONE  Final  Gram Stain   Final    RARE WBC PRESENT, PREDOMINANTLY MONONUCLEAR NO ORGANISMS SEEN    Culture NO GROWTH 1 DAY  Final   Report Status PENDING  Incomplete      Radiology Studies: Dg Chest Port 1 View  Result Date: 08/05/2016 CLINICAL DATA:  Peripheral effusion, shortness of breath EXAM: PORTABLE CHEST 1 VIEW COMPARISON:  CT chest 03/27/2016 FINDINGS: There is no focal parenchymal opacity. There is no pleural effusion or pneumothorax. The cardiac silhouette is enlarged likely reflecting a pericardial effusion given the prior exams. The osseous structures are unremarkable. IMPRESSION: 1. Enlarged cardiac silhouette likely reflecting a pericardial effusion. Electronically Signed   By: Kathreen Devoid   On: 08/05/2016 13:43     Scheduled Meds: . aspirin EC  81 mg Oral Daily  . atorvastatin  40 mg Oral Daily  . feeding supplement  1 Container Oral TID  . metoprolol  50 mg Oral BID  . pantoprazole (PROTONIX) IV  40 mg Intravenous QHS   Continuous Infusions:   LOS: 2 days   Time Spent in minutes   30 minutes  Lizza Huffaker D.O. on 08/07/2016 at 12:10  PM  Between 7am to 7pm - Pager - [phone]  After 7pm go to [email or web address] - password [email or web address]  And look for the night coverage person covering for me after hours  [facility] Office  [phone]

## 2016-08-08 ENCOUNTER — Inpatient Hospital Stay (HOSPITAL_COMMUNITY): Payer: Medicare Other

## 2016-08-08 DIAGNOSIS — I2510 Atherosclerotic heart disease of native coronary artery without angina pectoris: Secondary | ICD-10-CM

## 2016-08-08 DIAGNOSIS — I319 Disease of pericardium, unspecified: Secondary | ICD-10-CM

## 2016-08-08 LAB — BASIC METABOLIC PANEL
ANION GAP: 8 (ref 5–15)
BUN: 23 mg/dL — ABNORMAL HIGH (ref 6–20)
CALCIUM: 8.3 mg/dL — AB (ref 8.9–10.3)
CO2: 23 mmol/L (ref 22–32)
Chloride: 107 mmol/L (ref 101–111)
Creatinine, Ser: 0.66 mg/dL (ref 0.61–1.24)
Glucose, Bld: 113 mg/dL — ABNORMAL HIGH (ref 65–99)
Potassium: 3.8 mmol/L (ref 3.5–5.1)
SODIUM: 138 mmol/L (ref 135–145)

## 2016-08-08 LAB — CBC
HCT: 31.8 % — ABNORMAL LOW (ref 39.0–52.0)
Hemoglobin: 10.4 g/dL — ABNORMAL LOW (ref 13.0–17.0)
MCH: 29 pg (ref 26.0–34.0)
MCHC: 32.7 g/dL (ref 30.0–36.0)
MCV: 88.6 fL (ref 78.0–100.0)
PLATELETS: 116 10*3/uL — AB (ref 150–400)
RBC: 3.59 MIL/uL — ABNORMAL LOW (ref 4.22–5.81)
RDW: 15.1 % (ref 11.5–15.5)
WBC: 6.8 10*3/uL (ref 4.0–10.5)

## 2016-08-08 LAB — ECHOCARDIOGRAM LIMITED
Height: 71 in
WEIGHTICAEL: 2740.8 [oz_av]

## 2016-08-08 LAB — PH, BODY FLUID: pH, Body Fluid: 7.7

## 2016-08-08 MED ORDER — AMIODARONE HCL IN DEXTROSE 360-4.14 MG/200ML-% IV SOLN
60.0000 mg/h | INTRAVENOUS | Status: AC
  Administered 2016-08-08 (×2): 60 mg/h via INTRAVENOUS
  Filled 2016-08-08 (×2): qty 200

## 2016-08-08 MED ORDER — DILTIAZEM HCL 100 MG IV SOLR
INTRAVENOUS | Status: AC
  Filled 2016-08-08: qty 100

## 2016-08-08 MED ORDER — AMIODARONE LOAD VIA INFUSION
150.0000 mg | Freq: Once | INTRAVENOUS | Status: AC
  Administered 2016-08-08: 150 mg via INTRAVENOUS
  Filled 2016-08-08: qty 83.34

## 2016-08-08 MED ORDER — DILTIAZEM LOAD VIA INFUSION
5.0000 mg | Freq: Once | INTRAVENOUS | Status: AC
  Administered 2016-08-08: 5 mg via INTRAVENOUS
  Filled 2016-08-08: qty 5

## 2016-08-08 MED ORDER — DILTIAZEM HCL 100 MG IV SOLR
5.0000 mg/h | INTRAVENOUS | Status: DC
  Administered 2016-08-08: 5 mg/h via INTRAVENOUS

## 2016-08-08 MED ORDER — AMIODARONE HCL IN DEXTROSE 360-4.14 MG/200ML-% IV SOLN
30.0000 mg/h | INTRAVENOUS | Status: DC
  Administered 2016-08-09: 30 mg/h via INTRAVENOUS
  Filled 2016-08-08: qty 200

## 2016-08-08 NOTE — Progress Notes (Signed)
Paged Gregory [name] with cardiology. When the client arrived to [place] transferring from ICU. His heart rate was in the 130 to 160's, BP 83/72 and appeared to be in A-fib. An EKG was taken. Gregory [name] order a Cardizem bolus and gtt. Will continue to monitor the client closely.   Saddie Benders RN

## 2016-08-08 NOTE — Progress Notes (Addendum)
Patient HR sustaining in Afibb HR140-160s despite being on titratable cardizem drip. MD Irish Lack aware. Ordered Amiodarone bolus and see if patient converts to sinus rhythm. Paused cardizem gtt. Paged MD back to inform of patient's HR decrease while on amio bolus. Stated to stop the cardizem drip for now and to continue the amiodarone as a continuous infusion. Recent BP 106/51. Will continue to monitor patient closely.

## 2016-08-08 NOTE — Progress Notes (Signed)
    Patient went into AFib RVR.  Blood pressure decreased but he is mentating and feels well.  Will try low dose Cardizem and see if BP increases with lower HR. Patient had pericardial drain removed earlier today. Would not anticoagulate at this point given recent effusion.  Will follow.   Jettie Booze, MD

## 2016-08-08 NOTE — Progress Notes (Addendum)
Notified [place] with HR still in the 150's, BP 91/73, 8 beats of VT Cardizem gtt infusing at 7.5. [place] ordered to continue gtt and titration. If the client becomes symptomatic to notify may need to use an amio gtt. Will handoff these orders in report to night shift nurse and continue to monitor closely.   Saddie Benders RN

## 2016-08-08 NOTE — Progress Notes (Signed)
PROGRESS NOTE    Gregory [name]  [id number] DOB: 05-06-1930 DOA: 08/05/2016 PCP: [name], MD   Chief Complaint  Patient presents with  . Abdominal Pain    Brief Narrative:  82 y.o. male presented with Abdominal pain, nausea, vomiting. Started 1 day ago. Constant with waxing and waning nature. Getting worse. Nonradiating. Has not tried anything for relief. Last bowel movement 1 day ago. Last dose about course Wednesday morning at approximately 8 AM. Poor oral intake since that time. Denies any chest pain, palpitations, shortness of breath, fever, back pain, dysuria, frequency, neck stiffness, headache, LOC, melena, hematochezia.  Consult medical management  Assessment & Plan   Incarcerated Inguinal Hernia -Per primary team, general surgery -s/p laparoscopic repair of strangulated left inguinal hernia with phasix mesh and short segment small bowel resection -Continue pain control as needed -Diet resumed today   Pericardial effusion/tamponade -Cardiology consulted and appreciated -Echocardiogram EF65-70%, large pericardial effusion, incrases RV-LV interaction- consistent with tamponade physilogy -S/p pericardiocentesis, with drain placement  -Echocardiogram repeated today, showing mild posterolateral effusion and drain removed -Cardiology would like to repeat Echo in 48-72 hours  Hypotension -in the setting of SVT overnight -Responded well to IVF -Currently BP stable -Discontinued amlodipine, cozaar, imdur  HTN -As above -Continued metoprolol for rate control and lasix for chronic diastolic CHF  LE edema -Was on On Eliquis. R>L -Currently held due to pericardial effusion -LE doppler negative for DVT  CAD -S/p coronary artery catheterization 03/26/2016 with angioplasty.  -EKG without evidence of ACS -Continue ASA -Cardiology consulted and appreciated  Chronic diastolic congestive heart failure -Last Echo showing EF of 55% and grade 1 diastolic  dysfunction.  -Continue lasix -monitor intake/output, daily weights -Repeat echo pending  PAF/SVT  -Eliquis held -Continue metoprolol, cardizem discontinued   Hyperlipidemia -continue statin  DVT Prophylaxis  SCDs  Code Status: full  Family Communication: none at bedside  Disposition Plan: per primary team  Consultants [facility] Cardiology  Procedures  laparoscopic repair of strangulated left inguinal hernia with phasix mesh and short segment small bowel resection Pericardiocentesis   Antibiotics   Anti-infectives    Start     Dose/Rate Route Frequency Ordered Stop   08/06/16 0600  ceFAZolin (ANCEF) IVPB 2g/100 mL premix  Status:  Discontinued     2 g 200 mL/hr over 30 Minutes Intravenous On call to O.R. 08/05/16 1219 08/05/16 1815   08/06/16 0600  metroNIDAZOLE (FLAGYL) IVPB 500 mg     500 mg 100 mL/hr over 60 Minutes Intravenous On call to O.R. 08/05/16 1417 08/05/16 1456   08/05/16 1445  ceFAZolin (ANCEF) IVPB 2g/100 mL premix     2 g 200 mL/hr over 30 Minutes Intravenous On call to O.R. 08/05/16 1417 08/05/16 1444      Subjective:   Gregory [name] seen and examined today.  Complains of abdominal soreness. Was able to tolerate liquids yesterday. Denies chest pain, shortness of breath, bowel movement or gas passage.  Objective:   Vitals:   08/08/16 0812 08/08/16 0900 08/08/16 1000 08/08/16 1137  BP:  134/73 106/67   Pulse:   89   Resp:  (!) 23 (!) 6   Temp: 98.3 F (36.8 C)   99.2 F (37.3 C)  TempSrc: Oral   Oral  SpO2:   100%   Weight:      Height:        Intake/Output Summary (Last 24 hours) at 08/08/16 1222 Last data filed at 08/08/16 0900  Gross per 24 hour  Intake  480 ml  Output              695 ml  Net             -215 ml   Filed Weights   08/05/16 0738  Weight: 77.7 kg (171 lb 4.8 oz)    Exam  General: Well developed, well nourished, no apparent distress  HEENT: NCAT, mucous membranes moist.   Cardiovascular: S1 S2  auscultated,tachycardic  Respiratory: Clear to auscultation bilaterally with equal chest rise  Abdomen: Soft, nontender, nondistended, + bowel sounds. Bandages in place. Drain placed (to be removed today)  Extremities: warm dry without cyanosis clubbing, edema.  Neuro: AAOx3, nonfocal  Psych: appropriate    Data Reviewed: I have personally reviewed following labs and imaging studies  CBC:  Recent Labs Lab 08/04/16 0825 08/05/16 0726 08/06/16 0119 08/07/16 0334 08/08/16 0247  WBC 4.7 8.8 10.3 7.6 6.8  HGB 11.4* 11.9* 10.3* 10.2* 10.4*  HCT 38.0* 36.2* 31.8* 30.8* 31.8*  MCV 94.5 87.4 88.8 88.3 88.6  PLT 148 128* 119* 102* [id number]*   Basic Metabolic Panel:  Recent Labs Lab 08/04/16 0825 08/05/16 0726 08/06/16 0119 08/06/16 1908 08/07/16 0334 08/08/16 0247  NA 142 140 141  --  141 138  K 4.3 3.3* 3.6  --  3.9 3.8  CL 107 107 110  --  109 107  CO2 27 21* 23  --  24 23  GLUCOSE 90 159* 124* 107* 101* 113*  BUN 18 12 11   --  21* 23*  CREATININE 0.67* 0.79 0.75  --  0.80 0.66  CALCIUM 9.1 9.7 8.7*  --  8.5* 8.3*   GFR: Estimated Creatinine Clearance: 73.2 mL/min (by C-G formula based on SCr of 0.66 mg/dL). Liver Function Tests:  Recent Labs Lab 08/04/16 0825 08/05/16 0726 08/06/16 0119  AST 29 36 28  ALT 15 22 15*  ALKPHOS 81 87 59  BILITOT 3.0* 4.0* 4.8*  PROT 6.5 7.1 5.4*  ALBUMIN 3.8 4.0 2.9*    Recent Labs Lab 08/05/16 0726  LIPASE 27   No results for input(s): AMMONIA in the last 168 hours. Coagulation Profile:  Recent Labs Lab 08/05/16 1214  INR 1.37   Cardiac Enzymes: No results for input(s): CKTOTAL, CKMB, CKMBINDEX, TROPONINI in the last 168 hours. BNP (last 3 results) No results for input(s): PROBNP in the last 8760 hours. HbA1C: No results for input(s): HGBA1C in the last 72 hours. CBG: No results for input(s): GLUCAP in the last 168 hours. Lipid Profile: No results for input(s): CHOL, HDL, LDLCALC, TRIG, CHOLHDL, LDLDIRECT in  the last 72 hours. Thyroid Function Tests: No results for input(s): TSH, T4TOTAL, FREET4, T3FREE, THYROIDAB in the last 72 hours. Anemia Panel: No results for input(s): VITAMINB12, FOLATE, FERRITIN, TIBC, IRON, RETICCTPCT in the last 72 hours. Urine analysis:    Component Value Date/Time   COLORURINE YELLOW 08/05/2016 1107   APPEARANCEUR CLEAR 08/05/2016 1107   LABSPEC 1.044 (H) 08/05/2016 1107   PHURINE 6.0 08/05/2016 1107   GLUCOSEU 50 (A) 08/05/2016 1107   HGBUR NEGATIVE 08/05/2016 1107   BILIRUBINUR NEGATIVE 08/05/2016 1107   KETONESUR 20 (A) 08/05/2016 1107   PROTEINUR NEGATIVE 08/05/2016 1107   UROBILINOGEN 4.0 (H) 01/30/2014 1402   NITRITE NEGATIVE 08/05/2016 1107   LEUKOCYTESUR NEGATIVE 08/05/2016 1107   Sepsis Labs: @LABRCNTIP (procalcitonin:4,lacticidven:4)  ) Recent Results (from the past 240 hour(s))  Body fluid culture     Status: None (Preliminary result)   Collection Time: 08/06/16  4:56 PM  Result Value Ref Range Status   Specimen Description PERICARDIAL  Final   Special Requests NONE  Final   Gram Stain   Final    RARE WBC PRESENT, PREDOMINANTLY MONONUCLEAR NO ORGANISMS SEEN    Culture NO GROWTH 2 DAYS  Final   Report Status PENDING  Incomplete      Radiology Studies: Dg Chest Port 1 View  Result Date: 08/08/2016 CLINICAL DATA:  Status post removal of pericardial drain. EXAM: PORTABLE CHEST 1 VIEW COMPARISON:  Single-view of the chest 08/05/2016. Scratch the single view of the chest 08/05/2016 and 03/26/2016. FINDINGS: Cardiopericardial silhouette appears enlarged but smaller than on the most recent exam. Left pleural effusion and basilar airspace disease are noted. The right lung appears clear. No pneumothorax. Aortic atherosclerosis is noted. IMPRESSION: Left pleural effusion and basilar airspace disease, likely atelectasis. Enlarged cardiopericardial silhouette appears smaller than on the most recent exam. Atherosclerosis. Electronically Signed   By:  Inge Rise M.D.   On: 08/08/2016 11:28     Scheduled Meds: . aspirin EC  81 mg Oral Daily  . atorvastatin  40 mg Oral Daily  . feeding supplement  1 Container Oral TID  . metoprolol  50 mg Oral BID  . pantoprazole (PROTONIX) IV  40 mg Intravenous QHS   Continuous Infusions:   LOS: 3 days   Time Spent in minutes   30 minutes  Eathen Budreau D.O. on 08/08/2016 at 12:22 PM  Between 7am to 7pm - Pager - [phone]  After 7pm go to [email or web address] - password [email or web address]  And look for the night coverage person covering for me after hours  [facility] Office  [phone]

## 2016-08-08 NOTE — Progress Notes (Signed)
  Echocardiogram 2D Echocardiogram limited has been performed.  [name] 08/08/2016, 9:29 AM

## 2016-08-08 NOTE — Progress Notes (Addendum)
Subjective:  No CP/SOB. 75 CC drainage from pericardial drain X 24 hrs. Limited 2D pending  Objective:  Temp:  [98 F (36.7 C)-98.6 F (37 C)] 98.3 F (36.8 C) (12/24 0812) Pulse Rate:  [75-106] 75 (12/24 0600) Resp:  [14-26] 26 (12/24 0700) BP: (92-140)/(66-124) 125/69 (12/24 0700) SpO2:  [93 %-100 %] 98 % (12/24 0600) Weight change:   Intake/Output from previous day: 12/23 0701 - 12/24 0700 In: 480 [P.O.:480] Out: 1020 [Urine:950; Drains:70]  Intake/Output from this shift: No intake/output data recorded.  Physical Exam: General appearance: alert and no distress Neck: no adenopathy, no carotid bruit, no JVD, supple, symmetrical, trachea midline and thyroid not enlarged, symmetric, no tenderness/mass/nodules Lungs: clear to auscultation bilaterally Heart: regular rate and rhythm, S1, S2 normal, no murmur, click, rub or gallop Extremities: extremities normal, atraumatic, no cyanosis or edema  Lab Results: Results for orders placed or performed during the hospital encounter of 08/05/16 (from the past 48 hour(s))  Creatinine, body fluid     Status: None   Collection Time: 08/06/16  4:56 PM  Result Value Ref Range   Creat, Fluid 0.8 mg/dL    Comment: ORDERED IN ERROR SENT TO [facility] (NOTE) No normal range established for this test Results should be evaluated in conjunction with serum values   Body fluid cell count with differential     Status: Abnormal   Collection Time: 08/06/16  4:56 PM  Result Value Ref Range   Fluid Type-FCT PERICARDIAL    Color, Fluid RED (A) YELLOW   Appearance, Fluid CLOUDY (A) CLEAR   WBC, Fluid 138 0 - 1,000 cu mm   Lymphs, Fluid 83 %   Monocyte-Macrophage-Serous Fluid 17 (L) 50 - 90 %   Other Cells, Fluid PIGMENTED LADEN MACROPHAGES %  Body fluid culture     Status: None (Preliminary result)   Collection Time: 08/06/16  4:56 PM  Result Value Ref Range   Specimen Description PERICARDIAL    Special Requests NONE    Gram Stain     RARE WBC PRESENT, PREDOMINANTLY MONONUCLEAR NO ORGANISMS SEEN    Culture NO GROWTH 1 DAY    Report Status PENDING   Glucose, random     Status: Abnormal   Collection Time: 08/06/16  7:08 PM  Result Value Ref Range   Glucose, Bld 107 (H) 65 - 99 mg/dL  Basic metabolic panel     Status: Abnormal   Collection Time: 08/07/16  3:34 AM  Result Value Ref Range   Sodium 141 135 - 145 mmol/L   Potassium 3.9 3.5 - 5.1 mmol/L   Chloride 109 101 - 111 mmol/L   CO2 24 22 - 32 mmol/L   Glucose, Bld 101 (H) 65 - 99 mg/dL   BUN 21 (H) 6 - 20 mg/dL   Creatinine, Ser 0.80 0.61 - 1.24 mg/dL   Calcium 8.5 (L) 8.9 - 10.3 mg/dL   GFR calc non Af Amer >60 >60 mL/min   GFR calc Af Amer >60 >60 mL/min    Comment: (NOTE) The eGFR has been calculated using the CKD EPI equation. This calculation has not been validated in all clinical situations. eGFR's persistently <60 mL/min signify possible Chronic Kidney Disease.    Anion gap 8 5 - 15  CBC     Status: Abnormal   Collection Time: 08/07/16  3:34 AM  Result Value Ref Range   WBC 7.6 4.0 - 10.5 K/uL   RBC 3.49 (L) 4.22 - 5.81 MIL/uL  Hemoglobin 10.2 (L) 13.0 - 17.0 g/dL   HCT 30.8 (L) 39.0 - 52.0 %   MCV 88.3 78.0 - 100.0 fL   MCH 29.2 26.0 - 34.0 pg   MCHC 33.1 30.0 - 36.0 g/dL   RDW 15.5 11.5 - 15.5 %   Platelets 102 (L) 150 - 400 K/uL    Comment: CONSISTENT WITH PREVIOUS RESULT  CBC     Status: Abnormal   Collection Time: 08/08/16  2:47 AM  Result Value Ref Range   WBC 6.8 4.0 - 10.5 K/uL   RBC 3.59 (L) 4.22 - 5.81 MIL/uL   Hemoglobin 10.4 (L) 13.0 - 17.0 g/dL   HCT 31.8 (L) 39.0 - 52.0 %   MCV 88.6 78.0 - 100.0 fL   MCH 29.0 26.0 - 34.0 pg   MCHC 32.7 30.0 - 36.0 g/dL   RDW 15.1 11.5 - 15.5 %   Platelets 116 (L) 150 - 400 K/uL    Comment: CONSISTENT WITH PREVIOUS RESULT  Basic metabolic panel     Status: Abnormal   Collection Time: 08/08/16  2:47 AM  Result Value Ref Range   Sodium 138 135 - 145 mmol/L   Potassium 3.8 3.5 - 5.1  mmol/L   Chloride 107 101 - 111 mmol/L   CO2 23 22 - 32 mmol/L   Glucose, Bld 113 (H) 65 - 99 mg/dL   BUN 23 (H) 6 - 20 mg/dL   Creatinine, Ser 0.66 0.61 - 1.24 mg/dL   Calcium 8.3 (L) 8.9 - 10.3 mg/dL   GFR calc non Af Amer >60 >60 mL/min   GFR calc Af Amer >60 >60 mL/min    Comment: (NOTE) The eGFR has been calculated using the CKD EPI equation. This calculation has not been validated in all clinical situations. eGFR's persistently <60 mL/min signify possible Chronic Kidney Disease.    Anion gap 8 5 - 15    Imaging: Imaging results have been reviewed  Tele- SR/ST  Assessment/Plan:   1. Active Problems: 2.   Hypertension 3.   Hyperlipidemia 4.   PAF (paroxysmal atrial fibrillation) ([place]) 5.   CAD (coronary artery disease) 6.   Chronic diastolic congestive heart failure ([place]) 7.   Incarcerated inguinal hernia 8.   Incarcerated left inguinal hernia 9.   Preop cardiovascular exam 10.   Malnutrition of moderate degree 11.   Hypotension 12.   Cardiac tamponade 13.   Time Spent Directly with Patient:  20 minutes  Length of Stay:  LOS: 3 days   POD # 2 pericardiocentesis and placement of a drain for pericardial tamponade. He has had 75cc pericardial fluid drainage over past 24 hrs. Await limited 2D this AM . If effusion resolved can pull pericardial drain and transfer to tele. He has been in NSR. Would wait a few days before restarting Eliquis for stroke prophylaxis and follow 2D after that to assess for re accumulation.   [name] 08/08/2016, 8:30 AM    Addendum- After reviewing the results of the 2D echo the pericardial effusion has markedly improved. There is still a mild posterolateral effusion. No evid of tamponade. I removed the drain without complication. Tx to tele in 3 hours. Re check 2 D in 48-72 hours.  Lorretta Harp, M.D., [place], [facility], [place] [address] [place], [place]   [address]  [phone] 08/08/2016 10:58 AM

## 2016-08-08 NOTE — Progress Notes (Signed)
   Subjective: Denies abd pain.  Mild confusion.  Nurse states tol po  Objective: Vital signs in last 24 hours: Temp:  [98 F (36.7 C)-98.6 F (37 C)] 98.3 F (36.8 C) (12/24 0812) Pulse Rate:  [75-91] 89 (12/24 1000) Resp:  [6-26] 6 (12/24 1000) BP: (92-140)/(60-124) 106/67 (12/24 1000) SpO2:  [93 %-100 %] 100 % (12/24 1000) Last BM Date: 08/03/16  Intake/Output from previous day: 12/23 0701 - 12/24 0700 In: 480 [P.O.:480] Out: 1020 [Urine:950; Drains:70] Intake/Output this shift: Total I/O In: 240 [P.O.:240] Out: 75 [Urine:75]  General appearance: alert, cooperative and mildly confused GI: normal findings: soft, non-tender Incision/Wound: Dressings clean and dry  Lab Results:   Recent Labs  08/07/16 0334 08/08/16 0247  WBC 7.6 6.8  HGB 10.2* 10.4*  HCT 30.8* 31.8*  PLT 102* 116*   BMET  Recent Labs  08/07/16 0334 08/08/16 0247  NA 141 138  K 3.9 3.8  CL 109 107  CO2 24 23  GLUCOSE 101* 113*  BUN 21* 23*  CREATININE 0.80 0.66  CALCIUM 8.5* 8.3*     Studies/Results: No results found.  Anti-infectives: Anti-infectives    Start     Dose/Rate Route Frequency Ordered Stop   08/06/16 0600  ceFAZolin (ANCEF) IVPB 2g/100 mL premix  Status:  Discontinued     2 g 200 mL/hr over 30 Minutes Intravenous On call to O.R. 08/05/16 1219 08/05/16 1815   08/06/16 0600  metroNIDAZOLE (FLAGYL) IVPB 500 mg     500 mg 100 mL/hr over 60 Minutes Intravenous On call to O.R. 08/05/16 1417 08/05/16 1456   08/05/16 1445  ceFAZolin (ANCEF) IVPB 2g/100 mL premix     2 g 200 mL/hr over 30 Minutes Intravenous On call to O.R. 08/05/16 1417 08/05/16 1444      Assessment/Plan: Laparoscopic repair incarcerated IH and SBR.  Improving without apparent complication.  Mobilize as tolerated.  Advance diet.    LOS: 3 days    Shaaron Golliday T 08/08/2016

## 2016-08-08 NOTE — Progress Notes (Signed)
    Cardizem titration limited by Blood pressure readings.  Patient asymptomatic, eating dinner.  Will try amiodarone IV, 150 mg bolus and drip.  He has only been in AFib for a few hours so chemical cardioversion should be ok from a stroke risk standpoint.    Jettie Booze, MD

## 2016-08-09 DIAGNOSIS — I10 Essential (primary) hypertension: Secondary | ICD-10-CM

## 2016-08-09 LAB — MISC LABCORP TEST (SEND OUT)
LABCORP TEST CODE: 100479
Labcorp test code: 100156

## 2016-08-09 LAB — BASIC METABOLIC PANEL
Anion gap: 6 (ref 5–15)
BUN: 22 mg/dL — AB (ref 6–20)
CHLORIDE: 106 mmol/L (ref 101–111)
CO2: 28 mmol/L (ref 22–32)
CREATININE: 0.8 mg/dL (ref 0.61–1.24)
Calcium: 8.2 mg/dL — ABNORMAL LOW (ref 8.9–10.3)
Glucose, Bld: 134 mg/dL — ABNORMAL HIGH (ref 65–99)
POTASSIUM: 3.8 mmol/L (ref 3.5–5.1)
SODIUM: 140 mmol/L (ref 135–145)

## 2016-08-09 LAB — BODY FLUID CULTURE: Culture: NO GROWTH

## 2016-08-09 LAB — CBC
HCT: 33 % — ABNORMAL LOW (ref 39.0–52.0)
HEMOGLOBIN: 10.9 g/dL — AB (ref 13.0–17.0)
MCH: 28.8 pg (ref 26.0–34.0)
MCHC: 33 g/dL (ref 30.0–36.0)
MCV: 87.1 fL (ref 78.0–100.0)
PLATELETS: 124 10*3/uL — AB (ref 150–400)
RBC: 3.79 MIL/uL — AB (ref 4.22–5.81)
RDW: 15.3 % (ref 11.5–15.5)
WBC: 6.2 10*3/uL (ref 4.0–10.5)

## 2016-08-09 MED ORDER — PANTOPRAZOLE SODIUM 40 MG PO TBEC
40.0000 mg | DELAYED_RELEASE_TABLET | Freq: Every day | ORAL | Status: DC
  Administered 2016-08-09 – 2016-08-12 (×4): 40 mg via ORAL
  Filled 2016-08-09 (×4): qty 1

## 2016-08-09 MED ORDER — AMIODARONE HCL 200 MG PO TABS
400.0000 mg | ORAL_TABLET | Freq: Two times a day (BID) | ORAL | Status: DC
  Administered 2016-08-09 – 2016-08-10 (×3): 400 mg via ORAL
  Filled 2016-08-09 (×3): qty 2

## 2016-08-09 NOTE — Progress Notes (Signed)
PROGRESS NOTE    Gregory [name]  [id number] DOB: 1930/05/21 DOA: 08/05/2016 PCP: [name], MD   Chief Complaint  Patient presents with  . Abdominal Pain    Brief Narrative:  82 y.o. male presented with Abdominal pain, nausea, vomiting. Started 1 day ago. Constant with waxing and waning nature. Getting worse. Nonradiating. Has not tried anything for relief. Last bowel movement 1 day ago. Last dose about course Wednesday morning at approximately 8 AM. Poor oral intake since that time. Denies any chest pain, palpitations, shortness of breath, fever, back pain, dysuria, frequency, neck stiffness, headache, LOC, melena, hematochezia.  Consult medical management  Assessment & Plan   Incarcerated Inguinal Hernia -Per primary team, general surgery -s/p laparoscopic repair of strangulated left inguinal hernia with phasix mesh and short segment small bowel resection -Continue pain control as needed -Tolerating diet  Pericardial effusion/tamponade -Cardiology consulted and appreciated -Echocardiogram EF65-70%, large pericardial effusion, incrases RV-LV interaction- consistent with tamponade physilogy -S/p pericardiocentesis, with drain placement  -Echocardiogram repeated 12/24, showing mild posterolateral effusion and drain removed -Cardiology would like to repeat Echo tomorrow 12/26  Hypotension -in the setting of SVT overnight -Responded well to IVF -Currently BP stable -Discontinued amlodipine, cozaar, imdur  HTN -As above -Continued metoprolol for rate control and lasix for chronic diastolic CHF  LE edema -Was on On Eliquis. R>L -Currently held due to pericardial effusion -LE doppler negative for DVT  CAD -S/p coronary artery catheterization 03/26/2016 with angioplasty.  -EKG without evidence of ACS -Continue ASA -Cardiology consulted and appreciated  Chronic diastolic congestive heart failure -Last Echo showing EF of 55% and grade 1 diastolic dysfunction.    -Continue lasix -monitor intake/output, daily weights -Repeat echo pending  PAF/SVT  -Eliquis held -Continue metoprolol, cardizem discontinued  -Had Afib with RVR overnight, placed on cardizem drip by cardiology and transitioned to [facility].  Currently in sinus rhythm. Transitioned to oral amiodarone per cardiology  -Possibly restart Eliquis on 12/27  Hyperlipidemia -continue statin  DVT Prophylaxis  SCDs  Code Status: full  Family Communication: none at bedside  Disposition Plan: per primary team  Consultants [facility]  Procedures  laparoscopic repair of strangulated left inguinal hernia with phasix mesh and short segment small bowel resection Pericardiocentesis   Antibiotics   Anti-infectives    Start     Dose/Rate Route Frequency Ordered Stop   08/06/16 0600  ceFAZolin (ANCEF) IVPB 2g/100 mL premix  Status:  Discontinued     2 g 200 mL/hr over 30 Minutes Intravenous On call to O.R. 08/05/16 1219 08/05/16 1815   08/06/16 0600  metroNIDAZOLE (FLAGYL) IVPB 500 mg     500 mg 100 mL/hr over 60 Minutes Intravenous On call to O.R. 08/05/16 1417 08/05/16 1456   08/05/16 1445  ceFAZolin (ANCEF) IVPB 2g/100 mL premix     2 g 200 mL/hr over 30 Minutes Intravenous On call to O.R. 08/05/16 1417 08/05/16 1444      Subjective:   Gregory [name] seen and examined today.  Complains of abdominal soreness. Does not like what he had for breakfast. Was able to tolerate liquids yesterday. Denies chest pain, shortness of breath, bowel movement or gas passage.  Objective:   Vitals:   08/09/16 0413 08/09/16 0433 08/09/16 0508 08/09/16 0511  BP: (!) 131/98  113/73   Pulse:      Resp: (!) 29 19 (!) 32 17  Temp:   98.1 F (36.7 C)   TempSrc:      SpO2: 96% 98% 98% 98%  Weight:   75.9 kg (167 lb 4.8 oz)   Height:        Intake/Output Summary (Last 24 hours) at 08/09/16 1037 Last data filed at 08/09/16 0918  Gross per 24 hour  Intake           576.04 ml  Output               450 ml  Net           126.04 ml   Filed Weights   08/05/16 0738 08/09/16 0508  Weight: 77.7 kg (171 lb 4.8 oz) 75.9 kg (167 lb 4.8 oz)    Exam  General: Well developed, well nourished, no apparent distress  HEENT: NCAT, mucous membranes moist.   Cardiovascular: S1 S2 auscultated,RRR, 2/6 SEM  Respiratory: Clear to auscultation bilaterally with equal chest rise  Abdomen: Soft, nontender, nondistended, + bowel sounds. Bandages in place.   Extremities: warm dry without cyanosis clubbing, edema.  Neuro: AAOx3, nonfocal  Psych: appropriate    Data Reviewed: I have personally reviewed following labs and imaging studies  CBC:  Recent Labs Lab 08/05/16 0726 08/06/16 0119 08/07/16 0334 08/08/16 0247 08/09/16 0340  WBC 8.8 10.3 7.6 6.8 6.2  HGB 11.9* 10.3* 10.2* 10.4* 10.9*  HCT 36.2* 31.8* 30.8* 31.8* 33.0*  MCV 87.4 88.8 88.3 88.6 87.1  PLT 128* 119* 102* 116* [id number]*   Basic Metabolic Panel:  Recent Labs Lab 08/05/16 0726 08/06/16 0119 08/06/16 1908 08/07/16 0334 08/08/16 0247 08/09/16 0340  NA 140 141  --  141 138 140  K 3.3* 3.6  --  3.9 3.8 3.8  CL 107 110  --  109 107 106  CO2 21* 23  --  24 23 28   GLUCOSE 159* 124* 107* 101* 113* 134*  BUN 12 11  --  21* 23* 22*  CREATININE 0.79 0.75  --  0.80 0.66 0.80  CALCIUM 9.7 8.7*  --  8.5* 8.3* 8.2*   GFR: Estimated Creatinine Clearance: 73.2 mL/min (by C-G formula based on SCr of 0.8 mg/dL). Liver Function Tests:  Recent Labs Lab 08/04/16 0825 08/05/16 0726 08/06/16 0119  AST 29 36 28  ALT 15 22 15*  ALKPHOS 81 87 59  BILITOT 3.0* 4.0* 4.8*  PROT 6.5 7.1 5.4*  ALBUMIN 3.8 4.0 2.9*    Recent Labs Lab 08/05/16 0726  LIPASE 27   No results for input(s): AMMONIA in the last 168 hours. Coagulation Profile:  Recent Labs Lab 08/05/16 1214  INR 1.37   Cardiac Enzymes: No results for input(s): CKTOTAL, CKMB, CKMBINDEX, TROPONINI in the last 168 hours. BNP (last 3 results) No results for  input(s): PROBNP in the last 8760 hours. HbA1C: No results for input(s): HGBA1C in the last 72 hours. CBG: No results for input(s): GLUCAP in the last 168 hours. Lipid Profile: No results for input(s): CHOL, HDL, LDLCALC, TRIG, CHOLHDL, LDLDIRECT in the last 72 hours. Thyroid Function Tests: No results for input(s): TSH, T4TOTAL, FREET4, T3FREE, THYROIDAB in the last 72 hours. Anemia Panel: No results for input(s): VITAMINB12, FOLATE, FERRITIN, TIBC, IRON, RETICCTPCT in the last 72 hours. Urine analysis:    Component Value Date/Time   COLORURINE YELLOW 08/05/2016 1107   APPEARANCEUR CLEAR 08/05/2016 1107   LABSPEC 1.044 (H) 08/05/2016 1107   PHURINE 6.0 08/05/2016 1107   GLUCOSEU 50 (A) 08/05/2016 1107   HGBUR NEGATIVE 08/05/2016 1107   BILIRUBINUR NEGATIVE 08/05/2016 1107   KETONESUR 20 (A) 08/05/2016 1107   PROTEINUR NEGATIVE 08/05/2016 1107  UROBILINOGEN 4.0 (H) 01/30/2014 1402   NITRITE NEGATIVE 08/05/2016 1107   LEUKOCYTESUR NEGATIVE 08/05/2016 1107   Sepsis Labs: @LABRCNTIP (procalcitonin:4,lacticidven:4)  ) Recent Results (from the past 240 hour(s))  Body fluid culture     Status: None   Collection Time: 08/06/16  4:56 PM  Result Value Ref Range Status   Specimen Description PERICARDIAL  Final   Special Requests NONE  Final   Gram Stain   Final    RARE WBC PRESENT, PREDOMINANTLY MONONUCLEAR NO ORGANISMS SEEN    Culture NO GROWTH 3 DAYS  Final   Report Status 08/09/2016 FINAL  Final      Radiology Studies: Dg Chest Port 1 View  Result Date: 08/08/2016 CLINICAL DATA:  Status post removal of pericardial drain. EXAM: PORTABLE CHEST 1 VIEW COMPARISON:  Single-view of the chest 08/05/2016. Scratch the single view of the chest 08/05/2016 and 03/26/2016. FINDINGS: Cardiopericardial silhouette appears enlarged but smaller than on the most recent exam. Left pleural effusion and basilar airspace disease are noted. The right lung appears clear. No pneumothorax. Aortic  atherosclerosis is noted. IMPRESSION: Left pleural effusion and basilar airspace disease, likely atelectasis. Enlarged cardiopericardial silhouette appears smaller than on the most recent exam. Atherosclerosis. Electronically Signed   By: Inge Rise M.D.   On: 08/08/2016 11:28     Scheduled Meds: . amiodarone  400 mg Oral BID  . aspirin EC  81 mg Oral Daily  . atorvastatin  40 mg Oral Daily  . feeding supplement  1 Container Oral TID  . metoprolol  50 mg Oral BID  . pantoprazole (PROTONIX) IV  40 mg Intravenous QHS   Continuous Infusions: . amiodarone 30 mg/hr (08/09/16 0124)     LOS: 4 days   Time Spent in minutes   30 minutes  Luverne Farone D.O. on 08/09/2016 at 10:37 AM  Between 7am to 7pm - Pager - [phone]  After 7pm go to [email or web address] - password [email or web address]  And look for the night coverage person covering for me after hours  [facility] Office  [phone]

## 2016-08-09 NOTE — Progress Notes (Signed)
Patient Name: Gregory [name] Date of Encounter: 08/09/2016  Primary Cardiologist: Dr. Clearnce Hasten Problem List     Active Problems:   Hypertension   Hyperlipidemia   PAF (paroxysmal atrial fibrillation) (HCC)   CAD (coronary artery disease)   Chronic diastolic congestive heart failure (HCC)   Incarcerated inguinal hernia   Incarcerated left inguinal hernia   Preop cardiovascular exam   Malnutrition of moderate degree   Hypotension   Cardiac tamponade     Subjective   Feels well.  Appetite is still poor.  Denies chest pain or shortness of breath.   Inpatient Medications    Scheduled Meds: . aspirin EC  81 mg Oral Daily  . atorvastatin  40 mg Oral Daily  . feeding supplement  1 Container Oral TID  . metoprolol  50 mg Oral BID  . pantoprazole (PROTONIX) IV  40 mg Intravenous QHS   Continuous Infusions: . amiodarone 30 mg/hr (08/09/16 0124)  . diltiazem (CARDIZEM) infusion Stopped (08/08/16 1943)   PRN Meds: acetaminophen **OR** acetaminophen, diphenhydrAMINE **OR** [DISCONTINUED] diphenhydrAMINE, fentaNYL (SUBLIMAZE) injection, hydrALAZINE, HYDROmorphone (DILAUDID) injection, ondansetron **OR** ondansetron (ZOFRAN) IV   Vital Signs    Vitals:   08/09/16 0413 08/09/16 0433 08/09/16 0508 08/09/16 0511  BP: (!) 131/98  113/73   Pulse:      Resp: (!) 29 19 (!) 32 17  Temp:   98.1 F (36.7 C)   TempSrc:      SpO2: 96% 98% 98% 98%  Weight:   75.9 kg (167 lb 4.8 oz)   Height:        Intake/Output Summary (Last 24 hours) at 08/09/16 0655 Last data filed at 08/09/16 0500  Gross per 24 hour  Intake           741.04 ml  Output              945 ml  Net          -203.96 ml   Filed Weights   08/05/16 0738 08/09/16 0508  Weight: 77.7 kg (171 lb 4.8 oz) 75.9 kg (167 lb 4.8 oz)    Physical Exam   GEN: Frail, elderly man in no acute distress.  HEENT: Grossly normal.  Neck: Supple, no JVD, carotid bruits, or masses. Cardiac: RRR, III/VI systolic murmur at  the apex,  No rubs, or gallops. No clubbing, cyanosis, edema.  Radials/DP/PT 2+ and equal bilaterally.  Respiratory:  Respirations regular and unlabored, clear to auscultation bilaterally. GI: Soft, nontender, nondistended, BS + x 4. MS: no deformity or atrophy. Skin: warm and dry, no rash. Neuro:  Strength and sensation are intact. Psych: AAOx3.  Normal affect.  Labs    CBC  Recent Labs  08/08/16 0247 08/09/16 0340  WBC 6.8 6.2  HGB 10.4* 10.9*  HCT 31.8* 33.0*  MCV 88.6 87.1  PLT 116* [id number]*   Basic Metabolic Panel  Recent Labs  08/08/16 0247 08/09/16 0340  NA 138 140  K 3.8 3.8  CL 107 106  CO2 23 28  GLUCOSE 113* 134*  BUN 23* 22*  CREATININE 0.66 0.80  CALCIUM 8.3* 8.2*   Liver Function Tests No results for input(s): AST, ALT, ALKPHOS, BILITOT, PROT, ALBUMIN in the last 72 hours. No results for input(s): LIPASE, AMYLASE in the last 72 hours. Cardiac Enzymes No results for input(s): CKTOTAL, CKMB, CKMBINDEX, TROPONINI in the last 72 hours. BNP Invalid input(s): POCBNP D-Dimer No results for input(s): DDIMER in the last 72 hours. Hemoglobin A1C No  results for input(s): HGBA1C in the last 72 hours. Fasting Lipid Panel No results for input(s): CHOL, HDL, LDLCALC, TRIG, CHOLHDL, LDLDIRECT in the last 72 hours. Thyroid Function Tests No results for input(s): TSH, T4TOTAL, T3FREE, THYROIDAB in the last 72 hours.  Invalid input(s): FREET3  Telemetry    Atrial fibrillation with RVR yesterday.  Today sinus rhythm with first degree AV block.  PVCs. Episodes of SVT- Personally Reviewed  ECG    08/09/16: Sinus rhythm rate 84 bpm.  RBBB.  First degree AV block.  Prior inferior infarct.  PAC  - Personally Reviewed  Radiology    Dg Chest Port 1 View  Result Date: 08/08/2016 CLINICAL DATA:  Status post removal of pericardial drain. EXAM: PORTABLE CHEST 1 VIEW COMPARISON:  Single-view of the chest 08/05/2016. Scratch the single view of the chest 08/05/2016 and  03/26/2016. FINDINGS: Cardiopericardial silhouette appears enlarged but smaller than on the most recent exam. Left pleural effusion and basilar airspace disease are noted. The right lung appears clear. No pneumothorax. Aortic atherosclerosis is noted. IMPRESSION: Left pleural effusion and basilar airspace disease, likely atelectasis. Enlarged cardiopericardial silhouette appears smaller than on the most recent exam. Atherosclerosis. Electronically Signed   By: Inge Rise M.D.   On: 08/08/2016 11:28    Cardiac Studies   Echo 08/08/16: Study Conclusions  - Left ventricle: The cavity size was normal. There was severe   concentric hypertrophy. Systolic function was normal. The   estimated ejection fraction was in the range of 55% to 60%. Wall   motion was normal; there were no regional wall motion   abnormalities. Features are consistent with a pseudonormal left   ventricular filling pattern, with concomitant abnormal relaxation   and increased filling pressure (grade 2 diastolic dysfunction).   Doppler parameters are consistent with elevated ventricular   end-diastolic filling pressure. - Left atrium: The atrium was mildly dilated. - Right ventricle: The cavity size was normal. Wall thickness was   moderately increased. Systolic function was normal. - Right atrium: The atrium was mildly dilated.  Impressions:  - When compared to the prior study from 08/06/2016 pericardial   effusion has significantly improved, now localized inferolateral   (maximum diameter 1.8 cm) with no signs of tamponade.  Patient Profile     Gregory [name] is an 67M with chronic diastolic heart failure, hypertension, hyperlipidemia, and paroxysmal atrial fibrillation here with pericardial effusion s/p pericardiocentesis.  Assessment & Plan    # Pericardial effusion: # Tamponade: Gregory [name] had a pericardial drain that was removed yesterday.  Will plan for repeat echo tomorrow. He does not appear to have  elevated JVD or respiratory distress.    # Paroxysmal atrial fibrillation: Gregory [name] developed atrial fibrillation with RVR yesterday.   He is now back in sinus rhythm.  We will stop his IV amiodarone and switch to oral.  If the echo shows that his effusion has resolved, consider starting Eliquis back 12/27.  # CAD: Not an active problem.  Continue aspirin, atorvastatin, and metoprolol.   # Hypertension: BP controlled.  Continue metoprolol.  Signed, Skeet Latch, MD  08/09/2016, 6:55 AM

## 2016-08-09 NOTE — Progress Notes (Signed)
3 Days Post-Op   Subjective: No major complaints. Denies nausea or vomiting but has not been eating much. He would like to try to eat today. Sore when he moves around but otherwise denies abdominal pain  Objective: Vital signs in last 24 hours: Temp:  [97.5 F (36.4 C)-98.1 F (36.7 C)] 98.1 F (36.7 C) (12/25 0508) Pulse Rate:  [92-119] 119 (12/25 0016) Resp:  [16-34] 17 (12/25 0511) BP: (78-150)/(51-105) 113/73 (12/25 0508) SpO2:  [93 %-100 %] 98 % (12/25 0511) Weight:  [75.9 kg (167 lb 4.8 oz)] 75.9 kg (167 lb 4.8 oz) (12/25 0508) Last BM Date: 08/05/16  Intake/Output from previous day: 12/24 0701 - 12/25 0700 In: 741 [P.O.:360; I.V.:381] Out: 525 [Urine:525] Intake/Output this shift: Total I/O In: 75 [P.O.:75] Out: -   General appearance: alert, cooperative and no distress GI: normal findings: soft, non-tender and Nondistended Incision/Wound: No erythema or purulent drainage. Small amount of old blood.  Lab Results:   Recent Labs  08/08/16 0247 08/09/16 0340  WBC 6.8 6.2  HGB 10.4* 10.9*  HCT 31.8* 33.0*  PLT 116* 124*   BMET  Recent Labs  08/08/16 0247 08/09/16 0340  NA 138 140  K 3.8 3.8  CL 107 106  CO2 23 28  GLUCOSE 113* 134*  BUN 23* 22*  CREATININE 0.66 0.80  CALCIUM 8.3* 8.2*     Studies/Results: Dg Chest Port 1 View  Result Date: 08/08/2016 CLINICAL DATA:  Status post removal of pericardial drain. EXAM: PORTABLE CHEST 1 VIEW COMPARISON:  Single-view of the chest 08/05/2016. Scratch the single view of the chest 08/05/2016 and 03/26/2016. FINDINGS: Cardiopericardial silhouette appears enlarged but smaller than on the most recent exam. Left pleural effusion and basilar airspace disease are noted. The right lung appears clear. No pneumothorax. Aortic atherosclerosis is noted. IMPRESSION: Left pleural effusion and basilar airspace disease, likely atelectasis. Enlarged cardiopericardial silhouette appears smaller than on the most recent exam.  Atherosclerosis. Electronically Signed   By: Inge Rise M.D.   On: 08/08/2016 11:28    Anti-infectives: Anti-infectives    Start     Dose/Rate Route Frequency Ordered Stop   08/06/16 0600  ceFAZolin (ANCEF) IVPB 2g/100 mL premix  Status:  Discontinued     2 g 200 mL/hr over 30 Minutes Intravenous On call to O.R. 08/05/16 1219 08/05/16 1815   08/06/16 0600  metroNIDAZOLE (FLAGYL) IVPB 500 mg     500 mg 100 mL/hr over 60 Minutes Intravenous On call to O.R. 08/05/16 1417 08/05/16 1456   08/05/16 1445  ceFAZolin (ANCEF) IVPB 2g/100 mL premix     2 g 200 mL/hr over 30 Minutes Intravenous On call to O.R. 08/05/16 1417 08/05/16 1444      Assessment/Plan: s/p Procedure(s): Laparoscopic repair of incarcerated inguinal hernia with small bowel resection. Appears to be progressing well. On regular diet. Needs to mobilize with physical therapy.    LOS: 4 days    Gregory [name] T 08/09/2016

## 2016-08-09 NOTE — Progress Notes (Signed)
Patients HR maintained around 80-90s with rhythm change. EKG done. Shows Sinus rhythm with 1st degree A-V block. Pt is resting comfortably. Continues to be on amiodarone gtt. Will continue to monitor closely.

## 2016-08-10 ENCOUNTER — Encounter (HOSPITAL_COMMUNITY): Payer: Self-pay | Admitting: Cardiovascular Disease

## 2016-08-10 ENCOUNTER — Inpatient Hospital Stay (HOSPITAL_COMMUNITY): Payer: Medicare Other

## 2016-08-10 DIAGNOSIS — I313 Pericardial effusion (noninflammatory): Secondary | ICD-10-CM

## 2016-08-10 DIAGNOSIS — I2510 Atherosclerotic heart disease of native coronary artery without angina pectoris: Secondary | ICD-10-CM

## 2016-08-10 LAB — BASIC METABOLIC PANEL
ANION GAP: 8 (ref 5–15)
BUN: 17 mg/dL (ref 6–20)
CALCIUM: 8.2 mg/dL — AB (ref 8.9–10.3)
CO2: 24 mmol/L (ref 22–32)
Chloride: 105 mmol/L (ref 101–111)
Creatinine, Ser: 0.8 mg/dL (ref 0.61–1.24)
Glucose, Bld: 124 mg/dL — ABNORMAL HIGH (ref 65–99)
Potassium: 3.3 mmol/L — ABNORMAL LOW (ref 3.5–5.1)
SODIUM: 137 mmol/L (ref 135–145)

## 2016-08-10 LAB — ECHOCARDIOGRAM LIMITED
Height: 71 in
Weight: 2684.8 oz

## 2016-08-10 LAB — CBC
HEMATOCRIT: 30 % — AB (ref 39.0–52.0)
Hemoglobin: 10.2 g/dL — ABNORMAL LOW (ref 13.0–17.0)
MCH: 28.7 pg (ref 26.0–34.0)
MCHC: 34 g/dL (ref 30.0–36.0)
MCV: 84.3 fL (ref 78.0–100.0)
Platelets: 189 10*3/uL (ref 150–400)
RBC: 3.56 MIL/uL — ABNORMAL LOW (ref 4.22–5.81)
RDW: 14.8 % (ref 11.5–15.5)
WBC: 6.9 10*3/uL (ref 4.0–10.5)

## 2016-08-10 MED ORDER — AMIODARONE HCL IN DEXTROSE 360-4.14 MG/200ML-% IV SOLN
30.0000 mg/h | INTRAVENOUS | Status: DC
  Administered 2016-08-10 – 2016-08-11 (×2): 30 mg/h via INTRAVENOUS
  Filled 2016-08-10: qty 200

## 2016-08-10 MED ORDER — FUROSEMIDE 10 MG/ML IJ SOLN
40.0000 mg | Freq: Once | INTRAMUSCULAR | Status: AC
  Administered 2016-08-10: 40 mg via INTRAVENOUS
  Filled 2016-08-10: qty 4

## 2016-08-10 MED ORDER — AMIODARONE LOAD VIA INFUSION
150.0000 mg | Freq: Once | INTRAVENOUS | Status: AC
  Administered 2016-08-10: 150 mg via INTRAVENOUS
  Filled 2016-08-10: qty 83.34

## 2016-08-10 MED ORDER — ENOXAPARIN SODIUM 40 MG/0.4ML ~~LOC~~ SOLN
40.0000 mg | SUBCUTANEOUS | Status: DC
  Administered 2016-08-10 – 2016-08-13 (×4): 40 mg via SUBCUTANEOUS
  Filled 2016-08-10 (×4): qty 0.4

## 2016-08-10 MED ORDER — AMIODARONE HCL IN DEXTROSE 360-4.14 MG/200ML-% IV SOLN
60.0000 mg/h | INTRAVENOUS | Status: AC
  Administered 2016-08-10: 60 mg/h via INTRAVENOUS
  Filled 2016-08-10 (×2): qty 200

## 2016-08-10 MED ORDER — ENSURE ENLIVE PO LIQD
237.0000 mL | Freq: Three times a day (TID) | ORAL | Status: DC
  Administered 2016-08-10 – 2016-08-17 (×10): 237 mL via ORAL

## 2016-08-10 MED ORDER — ADULT MULTIVITAMIN W/MINERALS CH
1.0000 | ORAL_TABLET | Freq: Every day | ORAL | Status: DC
  Administered 2016-08-10 – 2016-08-22 (×13): 1 via ORAL
  Filled 2016-08-10 (×13): qty 1

## 2016-08-10 MED ORDER — POTASSIUM CHLORIDE CRYS ER 20 MEQ PO TBCR
40.0000 meq | EXTENDED_RELEASE_TABLET | Freq: Once | ORAL | Status: AC
  Administered 2016-08-10: 40 meq via ORAL
  Filled 2016-08-10: qty 2

## 2016-08-10 MED FILL — Heparin Sodium (Porcine) 2 Unit/ML in Sodium Chloride 0.9%: INTRAMUSCULAR | Qty: 500 | Status: AC

## 2016-08-10 NOTE — Care Management Important Message (Signed)
Important Message  Patient Details  Name: Gregory [name] MRN: [id number] Date of Birth: 02/16/1930   Medicare Important Message Given:  Yes    [name] 08/10/2016, 2:07 PM

## 2016-08-10 NOTE — Care Management Note (Signed)
Case Management Note Marvetta Gibbons RN, BSN Unit 2W-Case Manager [phone] Covering 3W  Patient Details  Name: Gregory [name] MRN: [id number] Date of Birth: 26-Dec-1929  Subjective/Objective:  Pt admitted with Incarcerated inguinal hernia s/p exp/lap for repair, pericardial effusion                   Action/Plan: PTA pt lived at home- PT/OT evals pending 12/26, pt may need SNF- CSW aware of possible placement needs. CM will continue to follow  Expected Discharge Date:                  Expected Discharge Plan:  [place]  In-House Referral:  Clinical Social Work  Discharge planning Services  CM Consult  Post Acute Care Choice:    Choice offered to:     DME Arranged:    DME Agency:     HH Arranged:    [place] Agency:     Status of Service:  In process, will continue to follow  If discussed at Long Length of Stay Meetings, dates discussed:    Additional Comments:  Dawayne Patricia, RN 08/10/2016, 11:21 AM

## 2016-08-10 NOTE — Progress Notes (Signed)
Nutrition Follow-up  DOCUMENTATION CODES:   Non-severe (moderate) malnutrition in context of chronic illness  INTERVENTION:    Ensure Enlive PO TID, each supplement provides 350 kcal and 20 grams of protein  D/C Boost Breeze  MVI daily  NUTRITION DIAGNOSIS:   Malnutrition related to chronic illness, poor appetite as evidenced by energy intake < 75% for > or equal to 1 month, mild depletion of body fat, moderate depletions of muscle mass.  Ongoing  GOAL:   Patient will meet greater than or equal to 90% of their needs  Progressing  MONITOR:   PO intake, Supplement acceptance, Weight trends, Skin, I & O's  REASON FOR ASSESSMENT:   Consult Assessment of nutrition requirement/status  ASSESSMENT:   Pt with incarcerated hernia and is s/p Lap hernia repair 12/21. Pt with known hx CAD, CHF, HTN. Admitted on 12/21 with   Diet has been advanced to dysphagia 3 with thin liquids. Patient continues to have a poor appetite and has been eating poorly, consuming 0-75% of meals. He does not really like the Boost Breeze supplements, will try Ensure Enlive instead.  Diet Order:  DIET DYS 3 Room service appropriate? Yes; Fluid consistency: Thin  Skin:  Reviewed, no issues  Last BM:  12/25  Height:   Ht Readings from Last 1 Encounters:  08/05/16 5\' 11"  (1.803 m)    Weight:   Wt Readings from Last 1 Encounters:  08/10/16 167 lb 12.8 oz (76.1 kg)    Ideal Body Weight:  78.2 kg  BMI:  Body mass index is 23.4 kg/m.  Estimated Nutritional Needs:   Kcal:  1900-2100  Protein:  95-105 gm  Fluid:  2 L  EDUCATION NEEDS:   No education needs identified at this time  Molli Barrows, [place], [place], [place] Pager [phone] After Hours Pager [phone]

## 2016-08-10 NOTE — Progress Notes (Signed)
PROGRESS NOTE    Gregory [name]  [id number] DOB: 05-Mar-1930 DOA: 08/05/2016 PCP: [name], MD   Chief Complaint  Patient presents with  . Abdominal Pain    Brief Narrative:  82 y.o. male presented with Abdominal pain, nausea, vomiting. Started 1 day ago. Constant with waxing and waning nature. Getting worse. Nonradiating. Has not tried anything for relief. Last bowel movement 1 day ago. Last dose about course Wednesday morning at approximately 8 AM. Poor oral intake since that time. Denies any chest pain, palpitations, shortness of breath, fever, back pain, dysuria, frequency, neck stiffness, headache, LOC, melena, hematochezia.  Consult medical management  Assessment & Plan   Incarcerated Inguinal Hernia -Per primary team, general surgery -s/p laparoscopic repair of strangulated left inguinal hernia with phasix mesh and short segment small bowel resection -Continue pain control as needed -Tolerating diet, but has poor appetite -Nutrition consulted and appreciated  Pericardial effusion/tamponade -Cardiology consulted and appreciated -Echocardiogram EF65-70%, large pericardial effusion, incrases RV-LV interaction- consistent with tamponade physilogy -S/p pericardiocentesis, with drain placement  -Echocardiogram repeated 12/24, showing mild posterolateral effusion and drain removed -Repeat echo 12/26: pericardial effusion is slightly larger wih max distance of 2.2 cm -Spoke with Dr. Stanford Breed, plan would be for a repeat echo in 1-2 weeks, if it continues to increase in size, patient would need a pericardial window  Acute on chronic diastolic congestive heart failure -Last Echo showing EF of 55% and grade 1 diastolic dysfunction.  -Continue lasix, given IV 40mg  today -monitor intake/output, daily weights  PAF/SVT  -Eliquis held -Continue metoprolol, cardizem discontinued  -Had Afib with RVR overnight, placed on cardizem drip by cardiology and transitioned to  [facility].  Currently in sinus rhythm. Transitioned to oral amiodarone per cardiology- however, today, patient converted back to Afib RVR -Back on IV amiodarone -Would not restart Eliquis as patient's effusion may worsen.   Hypotension -in the setting of SVT overnight -Responded well to IVF -Currently BP stable -Discontinued amlodipine, cozaar, imdur  HTN -As above -Continued metoprolol for rate control and lasix for chronic diastolic CHF  LE edema -Was on On Eliquis. R>L -Currently held due to pericardial effusion -LE doppler negative for DVT  CAD -S/p coronary artery catheterization 03/26/2016 with angioplasty.  -EKG without evidence of ACS -Continue ASA -Cardiology consulted and appreciated  Hyperlipidemia -continue statin  Deconditioning -PT and OT consulted -PT recommended SNF  DVT Prophylaxis  SCDs  Code Status: full  Family Communication: none at bedside  Disposition Plan: per primary team. Currently, medicine not providing much.  Appreciate cardiology's input and management. PT and OT consulted.  Patient does live alone and will likely benefit from SNF.  Consultants [facility]  Procedures  laparoscopic repair of strangulated left inguinal hernia with phasix mesh and short segment small bowel resection Pericardiocentesis   Antibiotics   Anti-infectives    Start     Dose/Rate Route Frequency Ordered Stop   08/06/16 0600  ceFAZolin (ANCEF) IVPB 2g/100 mL premix  Status:  Discontinued     2 g 200 mL/hr over 30 Minutes Intravenous On call to O.R. 08/05/16 1219 08/05/16 1815   08/06/16 0600  metroNIDAZOLE (FLAGYL) IVPB 500 mg     500 mg 100 mL/hr over 60 Minutes Intravenous On call to O.R. 08/05/16 1417 08/05/16 1456   08/05/16 1445  ceFAZolin (ANCEF) IVPB 2g/100 mL premix     2 g 200 mL/hr over 30 Minutes Intravenous On call to O.R. 08/05/16 1417 08/05/16 1444      Subjective:  Jacks Mutti seen and examined today.  Complains of abdominal  soreness and has poor appetite and feels weak. Denies chest pain, shortness of breath. Was able to have a bowel movement.   Objective:   Vitals:   08/09/16 0511 08/09/16 1529 08/09/16 2100 08/10/16 0500  BP:  117/70 100/74 106/73  Pulse:  82 (!) 101 90  Resp: 17 16 (!) 28 (!) 28  Temp:  98.7 F (37.1 C) 98.8 F (37.1 C) 98.7 F (37.1 C)  TempSrc:  Oral    SpO2: 98% 94% 92% 96%  Weight:    76.1 kg (167 lb 12.8 oz)  Height:        Intake/Output Summary (Last 24 hours) at 08/10/16 1248 Last data filed at 08/10/16 0900  Gross per 24 hour  Intake              670 ml  Output              950 ml  Net             -280 ml   Filed Weights   08/05/16 0738 08/09/16 0508 08/10/16 0500  Weight: 77.7 kg (171 lb 4.8 oz) 75.9 kg (167 lb 4.8 oz) 76.1 kg (167 lb 12.8 oz)    Exam  General: Well developed, thin, no apparent distress  HEENT: NCAT, mucous membranes moist.   Cardiovascular: S1 S2 auscultated, IRR, 2/6 SEM  Respiratory: Diminished breath sounds  Abdomen: Soft, nontender, nondistended, + bowel sounds.  Extremities: warm dry without cyanosis clubbing, edema.  Neuro: AAOx3, nonfocal  Psych: appropriate    Data Reviewed: I have personally reviewed following labs and imaging studies  CBC:  Recent Labs Lab 08/06/16 0119 08/07/16 0334 08/08/16 0247 08/09/16 0340 08/10/16 0317  WBC 10.3 7.6 6.8 6.2 6.9  HGB 10.3* 10.2* 10.4* 10.9* 10.2*  HCT 31.8* 30.8* 31.8* 33.0* 30.0*  MCV 88.8 88.3 88.6 87.1 84.3  PLT 119* 102* 116* 124* [id number]   Basic Metabolic Panel:  Recent Labs Lab 08/06/16 0119 08/06/16 1908 08/07/16 0334 08/08/16 0247 08/09/16 0340 08/10/16 0317  NA 141  --  141 138 140 137  K 3.6  --  3.9 3.8 3.8 3.3*  CL 110  --  109 107 106 105  CO2 23  --  24 23 28 24   GLUCOSE 124* 107* 101* 113* 134* 124*  BUN 11  --  21* 23* 22* 17  CREATININE 0.75  --  0.80 0.66 0.80 0.80  CALCIUM 8.7*  --  8.5* 8.3* 8.2* 8.2*   GFR: Estimated Creatinine Clearance:  73.2 mL/min (by C-G formula based on SCr of 0.8 mg/dL). Liver Function Tests:  Recent Labs Lab 08/04/16 0825 08/05/16 0726 08/06/16 0119  AST 29 36 28  ALT 15 22 15*  ALKPHOS 81 87 59  BILITOT 3.0* 4.0* 4.8*  PROT 6.5 7.1 5.4*  ALBUMIN 3.8 4.0 2.9*    Recent Labs Lab 08/05/16 0726  LIPASE 27   No results for input(s): AMMONIA in the last 168 hours. Coagulation Profile:  Recent Labs Lab 08/05/16 1214  INR 1.37   Cardiac Enzymes: No results for input(s): CKTOTAL, CKMB, CKMBINDEX, TROPONINI in the last 168 hours. BNP (last 3 results) No results for input(s): PROBNP in the last 8760 hours. HbA1C: No results for input(s): HGBA1C in the last 72 hours. CBG: No results for input(s): GLUCAP in the last 168 hours. Lipid Profile: No results for input(s): CHOL, HDL, LDLCALC, TRIG, CHOLHDL, LDLDIRECT in the last 72  hours. Thyroid Function Tests: No results for input(s): TSH, T4TOTAL, FREET4, T3FREE, THYROIDAB in the last 72 hours. Anemia Panel: No results for input(s): VITAMINB12, FOLATE, FERRITIN, TIBC, IRON, RETICCTPCT in the last 72 hours. Urine analysis:    Component Value Date/Time   COLORURINE YELLOW 08/05/2016 1107   APPEARANCEUR CLEAR 08/05/2016 1107   LABSPEC 1.044 (H) 08/05/2016 1107   PHURINE 6.0 08/05/2016 1107   GLUCOSEU 50 (A) 08/05/2016 1107   HGBUR NEGATIVE 08/05/2016 1107   BILIRUBINUR NEGATIVE 08/05/2016 1107   KETONESUR 20 (A) 08/05/2016 1107   PROTEINUR NEGATIVE 08/05/2016 1107   UROBILINOGEN 4.0 (H) 01/30/2014 1402   NITRITE NEGATIVE 08/05/2016 1107   LEUKOCYTESUR NEGATIVE 08/05/2016 1107   Sepsis Labs: @LABRCNTIP (procalcitonin:4,lacticidven:4)  ) Recent Results (from the past 240 hour(s))  Body fluid culture     Status: None   Collection Time: 08/06/16  4:56 PM  Result Value Ref Range Status   Specimen Description PERICARDIAL  Final   Special Requests NONE  Final   Gram Stain   Final    RARE WBC PRESENT, PREDOMINANTLY MONONUCLEAR NO  ORGANISMS SEEN    Culture NO GROWTH 3 DAYS  Final   Report Status 08/09/2016 FINAL  Final      Radiology Studies: No results found.   Scheduled Meds: . amiodarone  150 mg Intravenous Once  . aspirin EC  81 mg Oral Daily  . atorvastatin  40 mg Oral Daily  . enoxaparin  40 mg Subcutaneous Q24H  . feeding supplement  1 Container Oral TID  . metoprolol  50 mg Oral BID  . pantoprazole  40 mg Oral QHS   Continuous Infusions: . amiodarone     Followed by  . amiodarone       LOS: 5 days   Time Spent in minutes   30 minutes  Kymber Kosar D.O. on 08/10/2016 at 12:48 PM  Between 7am to 7pm - Pager - [phone]  After 7pm go to [email or web address] - password [email or web address]  And look for the night coverage person covering for me after hours  [facility] Office  [phone]

## 2016-08-10 NOTE — Progress Notes (Signed)
Patient Name: Gregory [name] Date of Encounter: 08/10/2016  Primary Cardiologist: Dr. Clearnce Hasten Problem List     Active Problems:   Hypertension   Hyperlipidemia   PAF (paroxysmal atrial fibrillation) (HCC)   CAD (coronary artery disease)   Chronic diastolic congestive heart failure (HCC)   Incarcerated inguinal hernia   Incarcerated left inguinal hernia   Preop cardiovascular exam   Malnutrition of moderate degree   Hypotension   Cardiac tamponade     Subjective   Feels weak. No chest pain or sob.   Inpatient Medications    Scheduled Meds: . amiodarone  400 mg Oral BID  . aspirin EC  81 mg Oral Daily  . atorvastatin  40 mg Oral Daily  . feeding supplement  1 Container Oral TID  . metoprolol  50 mg Oral BID  . pantoprazole  40 mg Oral QHS   Continuous Infusions:  PRN Meds: acetaminophen **OR** acetaminophen, diphenhydrAMINE **OR** [DISCONTINUED] diphenhydrAMINE, fentaNYL (SUBLIMAZE) injection, hydrALAZINE, HYDROmorphone (DILAUDID) injection, ondansetron **OR** ondansetron (ZOFRAN) IV   Vital Signs    Vitals:   08/09/16 0511 08/09/16 1529 08/09/16 2100 08/10/16 0500  BP:  117/70 100/74 106/73  Pulse:  82 (!) 101 90  Resp: 17 16 (!) 28 (!) 28  Temp:  98.7 F (37.1 C) 98.8 F (37.1 C) 98.7 F (37.1 C)  TempSrc:  Oral    SpO2: 98% 94% 92% 96%  Weight:    167 lb 12.8 oz (76.1 kg)  Height:        Intake/Output Summary (Last 24 hours) at 08/10/16 1000 Last data filed at 08/10/16 0900  Gross per 24 hour  Intake              670 ml  Output              950 ml  Net             -280 ml   Filed Weights   08/05/16 0738 08/09/16 0508 08/10/16 0500  Weight: 171 lb 4.8 oz (77.7 kg) 167 lb 4.8 oz (75.9 kg) 167 lb 12.8 oz (76.1 kg)    Physical Exam   GEN: Frail elderly male in no acute distress.  HEENT: Grossly normal.  Neck: Supple, no JVD, carotid bruits, or masses. Cardiac: Ir It with tachycardia, III/VI systolic  murmurs, rubs, or gallops. No  clubbing, cyanosis, edema.  Radials/DP/PT 2+ and equal bilaterally.  Respiratory:  Respirations regular and unlabored, Diminished breath sound at base GI: Soft, nontender, nondistended, BS + x 4. MS: no deformity or atrophy. Skin: warm and dry, no rash. Neuro:  Strength and sensation are intact. Psych: AAOx3.  Normal affect.  Labs    CBC  Recent Labs  08/09/16 0340 08/10/16 0317  WBC 6.2 6.9  HGB 10.9* 10.2*  HCT 33.0* 30.0*  MCV 87.1 84.3  PLT 124* 99991111   Basic Metabolic Panel  Recent Labs  08/09/16 0340 08/10/16 0317  NA 140 137  K 3.8 3.3*  CL 106 105  CO2 28 24  GLUCOSE 134* 124*  BUN 22* 17  CREATININE 0.80 0.80  CALCIUM 8.2* 8.2*   Liver Function Tests No results for input(s): AST, ALT, ALKPHOS, BILITOT, PROT, ALBUMIN in the last 72 hours. No results for input(s): LIPASE, AMYLASE in the last 72 hours. Cardiac Enzymes No results for input(s): CKTOTAL, CKMB, CKMBINDEX, TROPONINI in the last 72 hours. BNP Invalid input(s): POCBNP D-Dimer No results for input(s): DDIMER in the last 72 hours.  Hemoglobin A1C No results for input(s): HGBA1C in the last 72 hours. Fasting Lipid Panel No results for input(s): CHOL, HDL, LDLCALC, TRIG, CHOLHDL, LDLDIRECT in the last 72 hours. Thyroid Function Tests No results for input(s): TSH, T4TOTAL, T3FREE, THYROIDAB in the last 72 hours.  Invalid input(s): FREET3  Telemetry    Went into sinus arrhythmia this morning around 7:30am. Initially SVT now in Afib rate in 130-140s - Personally Reviewed  ECG    N/A  Radiology    Dg Chest Port 1 View  Result Date: 08/08/2016 CLINICAL DATA:  Status post removal of pericardial drain. EXAM: PORTABLE CHEST 1 VIEW COMPARISON:  Single-view of the chest 08/05/2016. Scratch the single view of the chest 08/05/2016 and 03/26/2016. FINDINGS: Cardiopericardial silhouette appears enlarged but smaller than on the most recent exam. Left pleural effusion and basilar airspace disease are  noted. The right lung appears clear. No pneumothorax. Aortic atherosclerosis is noted. IMPRESSION: Left pleural effusion and basilar airspace disease, likely atelectasis. Enlarged cardiopericardial silhouette appears smaller than on the most recent exam. Atherosclerosis. Electronically Signed   By: Inge Rise M.D.   On: 08/08/2016 11:28    Cardiac Studies   Echo 08/10/16  Study Conclusions  - Left ventricle: The cavity size was normal. There was severe   concentric hypertrophy. Systolic function was normal. The   estimated ejection fraction was in the range of 55% to 60%. Wall   motion was normal; there were no regional wall motion   abnormalities. The study is not technically sufficient to allow   evaluation of LV diastolic function. - Aortic valve: Transvalvular velocity was within the normal range.   There was no stenosis. There was no regurgitation. - Mitral valve: Transvalvular velocity was within the normal range.   There was no evidence for stenosis. There was mild regurgitation. - Right ventricle: The cavity size was normal. Wall thickness was   normal. Systolic function was normal. - Tricuspid valve: There was no regurgitation. - Pericardium, extracardiac: There was no chamber collapse.   Features were not consistent with tamponade physiology. A   moderate residual pericardial effusion was identified, mostly   posterolateral. There was a left pleural effusion.  Impressions:  - Compared with echo performed 08/08/16, pericardial effusion is   slightly larger wih max distance of 2.2 cm.  Echo 08/08/16  Study Conclusions  - Left ventricle: The cavity size was normal. There was severe   concentric hypertrophy. Systolic function was normal. The   estimated ejection fraction was in the range of 55% to 60%. Wall   motion was normal; there were no regional wall motion   abnormalities. Features are consistent with a pseudonormal left   ventricular filling pattern,  with concomitant abnormal relaxation   and increased filling pressure (grade 2 diastolic dysfunction).   Doppler parameters are consistent with elevated ventricular   end-diastolic filling pressure. - Left atrium: The atrium was mildly dilated. - Right ventricle: The cavity size was normal. Wall thickness was   moderately increased. Systolic function was normal. - Right atrium: The atrium was mildly dilated.  Impressions:  - When compared to the prior study from 08/06/2016 pericardial   effusion has significantly improved, now localized inferolateral   (maximum diameter 1.8 cm) with no signs of tamponade.   Echo 08/06/16 Study Conclusions  - Left ventricle: The cavity size was normal. There was severe   concentric hypertrophy. Systolic function was vigorous. The   estimated ejection fraction was in the range of 65% to 70%.  There   was dynamic obstruction, with a peak velocity of 412 cm/sec and a   peak gradient of 68 mm Hg. Wall motion was normal; there were no   regional wall motion abnormalities. - Aortic valve: There was no regurgitation. - Mitral valve: Transvalvular velocity was within the normal range.   There was no evidence for stenosis. There was no regurgitation. - Left atrium: The atrium was moderately dilated. - Right ventricle: The cavity size was normal. Wall thickness was   normal. Systolic function was normal. - Tricuspid valve: There was no regurgitation. - Pulmonary arteries: Systolic pressure was within the normal   range. PA peak pressure: 26 mm Hg (S). - Pericardium, extracardiac: A large pericardial effusion was   identified circumferential to the heart. There was right atrial   chamber collapse. There was evidence for increased RV-LV   interaction demonstrated by respirophasic changes in transmitral   velocities. Features were consistent with tamponade physiology.  Impressions:  - Compared with the echo 03/2016, the pericardial effusion is now    much larger. Findings are consistent with tamponade. Patient Profile     Mr. Hasman is a 82 year old male with a past medical history of HTN, nonhemorrhagic CVA, GI bleed, STEMI 03/2016, CHF, PAFon Eliquis who presented to the [facility] ED with complaints of progressively worsening lower abdominal s/p s/p laparoscopic repair of strangulated left inguinal hernia with phasix mesh and short segment small bowel resection. Also has pericardial effusion s/p pericardiocentesis.  Assessment & Plan    1# Pericardial effusion/Tamponade: - Initially had tamponade features requiring pericardiocentesis. Drain removed 08/08/16. Echo this morning showed pericardial effusion is slightly larger wih max distance of 2.2 cm (mostly posterolateral). No tamponade physiology.  - He does not appear to have  respiratory distress.  Will review with MD.   2 # Paroxysmal atrial fibrillation:  - Converted to sinus on IVmiodarone and switch to oral.  This morning he again went back into sinus arrhythmia. Initially his rhythm was regular (likely SVT) currently in afib. Rate in 130 to 140s. Currently on Amiodarone 400mg  BID. Echo showed enlarged pericardial effusion. Eliquis on hold. May restart anticoagulation from abdominal surgery point of view indicated.  3 # CAD:  - No angina. Continue aspirin, atorvastatin, and metoprolol.   4 # Hypertension: - BP controlled.  Continue metoprolol.  Signed, Leanor Kail, PA  08/10/2016, 10:00 AM   As above, patient seen and examined. Patient has mild dyspnea. His atrial fibrillation has recurred with elevated rate. No chest pain or palpitations. Diminished breath sounds at bases bilaterally.  1 paroxysmal atrial fibrillation-patient has developed recurrent atrial fibrillation. We will resume IV amiodarone and continue metoprolol. I will not fully anticoagulate at this point given recent pericardial drain and effusion. I would be concerned about hemorrhagic transformation.  2  pericardial effusion-status post pericardiocentesis. Repeat echocardiogram has been personally reviewed. It appears to be moderate and slightly larger compared to December 24. If it increases in size he would require pericardial window.  3 acute diastolic congestive heart failure-patient has diminished breath sounds at his bases. I will give Lasix 40 mg IV today.  4 possible amyloidosis-echo is concerning for amyloid as was previous MRI. However given age no good options for therapy.  5 coronary artery disease-continue aspirin and statin.  Kirk Ruths, MD

## 2016-08-10 NOTE — Progress Notes (Signed)
4 Days Post-Op   Subjective: No major complaints. Not really eating at all, says he has no appetite. Denies nausea or vomiting or abdominal pain. He feels weak.  Objective: Vital signs in last 24 hours: Temp:  [98.7 F (37.1 C)-98.8 F (37.1 C)] 98.7 F (37.1 C) (12/26 0500) Pulse Rate:  [82-101] 90 (12/26 0500) Resp:  [16-28] 28 (12/26 0500) BP: (100-117)/(70-74) 106/73 (12/26 0500) SpO2:  [92 %-96 %] 96 % (12/26 0500) Weight:  [76.1 kg (167 lb 12.8 oz)] 76.1 kg (167 lb 12.8 oz) (12/26 0500) Last BM Date: 08/09/16  Intake/Output from previous day: 12/25 0701 - 12/26 0700 In: 505 [P.O.:505] Out: 950 [Urine:950] Intake/Output this shift: No intake/output data recorded.  General appearance: alert, cooperative and no distress Resp: clear to auscultation bilaterally GI: normal findings: soft, non-tender and Nondistended Incision/Wound: Clean and dry  Lab Results:   Recent Labs  08/09/16 0340 08/10/16 0317  WBC 6.2 6.9  HGB 10.9* 10.2*  HCT 33.0* 30.0*  PLT 124* 189   BMET  Recent Labs  08/09/16 0340 08/10/16 0317  NA 140 137  K 3.8 3.3*  CL 106 105  CO2 28 24  GLUCOSE 134* 124*  BUN 22* 17  CREATININE 0.80 0.80  CALCIUM 8.2* 8.2*     Studies/Results: Dg Chest Port 1 View  Result Date: 08/08/2016 CLINICAL DATA:  Status post removal of pericardial drain. EXAM: PORTABLE CHEST 1 VIEW COMPARISON:  Single-view of the chest 08/05/2016. Scratch the single view of the chest 08/05/2016 and 03/26/2016. FINDINGS: Cardiopericardial silhouette appears enlarged but smaller than on the most recent exam. Left pleural effusion and basilar airspace disease are noted. The right lung appears clear. No pneumothorax. Aortic atherosclerosis is noted. IMPRESSION: Left pleural effusion and basilar airspace disease, likely atelectasis. Enlarged cardiopericardial silhouette appears smaller than on the most recent exam. Atherosclerosis. Electronically Signed   By: Inge Rise M.D.    On: 08/08/2016 11:28    Anti-infectives: Anti-infectives    Start     Dose/Rate Route Frequency Ordered Stop   08/06/16 0600  ceFAZolin (ANCEF) IVPB 2g/100 mL premix  Status:  Discontinued     2 g 200 mL/hr over 30 Minutes Intravenous On call to O.R. 08/05/16 1219 08/05/16 1815   08/06/16 0600  metroNIDAZOLE (FLAGYL) IVPB 500 mg     500 mg 100 mL/hr over 60 Minutes Intravenous On call to O.R. 08/05/16 1417 08/05/16 1456   08/05/16 1445  ceFAZolin (ANCEF) IVPB 2g/100 mL premix     2 g 200 mL/hr over 30 Minutes Intravenous On call to O.R. 08/05/16 1417 08/05/16 1444      Assessment/Plan: s/p Procedure(s): Laparoscopic repair of incarcerated inguinal hernia with small bowel resection. Abdomen seems okay. Appetite is poor still. Monitor by mouth intake. OT PT evaluation. Discharge planning consulted, patient lived alone preoperatively Pericardiocentesis for pericardial effusion. A. fib was on anticoagulation. For repeat echo today. May restart anticoagulation from abdominal surgery point of view indicated.    LOS: 5 days    Muriel Wilber T 08/10/2016

## 2016-08-10 NOTE — Evaluation (Signed)
Physical Therapy Evaluation Patient Details Name: Gregory [name] MRN: [id number] DOB: 12-31-1929 Today's Date: 08/10/2016   History of Present Illness  PT is a 82 year old male with a history of HTN, nonhemorrhagic CVA, GI bleed, STEMI 03/2016, CHF, A. Fib on Eliquis (last dose yesterday morning) who presented to the [facility] ED with complaints of progressively worsening lower abdominal pain that started yesterday.  Pt s/p  Diagnostic laparoscopy, reduction of strangulated small bowel from left inguinal hernia, TAPP repair with phasix mesh, small bowel resection.   Also complication of tamponade symptoms, s/p pericardiocentesis.  Clinical Impression  Pt admitted with/for abdominal pain, s/p hernia repair and need for pericardiocentesis.  Pt is presently at a minimal assist level for mobility..  Pt currently limited functionally due to the problems listed. ( See problems list.)   Pt will benefit from PT to maximize function and safety in order to get ready for next venue listed below.     Follow Up Recommendations SNF    Equipment Recommendations  Rolling walker with 5" wheels    Recommendations for Other Services       Precautions / Restrictions Precautions Precautions: Fall Restrictions Weight Bearing Restrictions: No      Mobility  Bed Mobility Overal bed mobility: Needs Assistance Bed Mobility: Supine to Sit     Supine to sit: Min guard;HOB elevated     General bed mobility comments: struggled to EOB with extra time due in part to R>L shoulder dysfunction.  Transfers Overall transfer level: Needs assistance Equipment used: Rolling walker (2 wheeled) Transfers: Sit to/from Stand Sit to Stand: Min assist         General transfer comment: cues for hand placement.  lift and steady assist  Ambulation/Gait Ambulation/Gait assistance: Min assist Ambulation Distance (Feet): 40 Feet Assistive device: Rolling walker (2 wheeled) Gait Pattern/deviations: Step-through  pattern Gait velocity: slower Gait velocity interpretation: Below normal speed for age/gender General Gait Details: weak-kneed gait, more wobbly than ready to collapse, but mildly unstable overall.  Stairs            Wheelchair Mobility    Modified Rankin (Stroke Patients Only)       Balance Overall balance assessment: Needs assistance Sitting-balance support: No upper extremity supported Sitting balance-Leahy Scale: Good     Standing balance support: Bilateral upper extremity supported Standing balance-Leahy Scale: Poor Standing balance comment: reliant on the RW and some external support                             Pertinent Vitals/Pain Pain Assessment: Faces Faces Pain Scale: Hurts little more Pain Location: R shd > L shd, abdominal incision Pain Descriptors / Indicators: Sore Pain Intervention(s): Monitored during session;Limited activity within patient's tolerance    Home Living Family/patient expects to be discharged to:: Private residence Living Arrangements: Alone Available Help at Discharge: Family;Friend(s);Available PRN/intermittently Type of Home: House Home Access: Stairs to enter   Entrance Stairs-Number of Steps: 2 in front no rails, 2 in back with rails.  uses front. Home Layout: One level Home Equipment: Cane - single point      Prior Function Level of Independence: Independent with assistive device(s)               Hand Dominance        Extremity/Trunk Assessment   Upper Extremity Assessment Upper Extremity Assessment: Defer to OT evaluation (R shd limited to ~50* flexion, left functional)  Lower Extremity Assessment Lower Extremity Assessment: Overall WFL for tasks assessed (proximal weaknesses, R weaker than L LE overall)       Communication   Communication: No difficulties  Cognition Arousal/Alertness: Awake/alert Behavior During Therapy: WFL for tasks assessed/performed Overall Cognitive Status: Within  Functional Limits for tasks assessed                      General Comments      Exercises     Assessment/Plan    PT Assessment Patient needs continued PT services  PT Problem List Decreased strength;Decreased activity tolerance;Decreased balance;Decreased mobility;Decreased knowledge of use of DME;Pain          PT Treatment Interventions Gait training;Functional mobility training;Therapeutic activities;Balance training;Patient/family education;DME instruction    PT Goals (Current goals can be found in the Care Plan section)  Acute Rehab PT Goals Patient Stated Goal: get back home PT Goal Formulation: With patient Time For Goal Achievement: 08/24/16 Potential to Achieve Goals: Fair    Frequency Min 3X/week   Barriers to discharge Decreased caregiver support      Co-evaluation               End of Session   Activity Tolerance: Patient tolerated treatment well Patient left: in chair;with call bell/phone within reach;with chair alarm set Nurse Communication: Mobility status         Time: [id number] PT Time Calculation (min) (ACUTE ONLY): 28 min   Charges:   PT Evaluation $PT Eval Moderate Complexity: 1 Procedure PT Treatments $Gait Training: 8-22 mins   PT G CodesTessie Fass Maurilio Puryear 08/10/2016, 11:45 AM 08/10/2016  Donnella Sham, PT [phone]  (pager)

## 2016-08-10 NOTE — Plan of Care (Signed)
Problem: Education: Goal: Knowledge of [facility] Education information/materials will improve Outcome: Progressing POC reviewed wth pt.

## 2016-08-11 LAB — CBC
HEMATOCRIT: 29.4 % — AB (ref 39.0–52.0)
Hemoglobin: 9.8 g/dL — ABNORMAL LOW (ref 13.0–17.0)
MCH: 28 pg (ref 26.0–34.0)
MCHC: 33.3 g/dL (ref 30.0–36.0)
MCV: 84 fL (ref 78.0–100.0)
PLATELETS: 200 10*3/uL (ref 150–400)
RBC: 3.5 MIL/uL — ABNORMAL LOW (ref 4.22–5.81)
RDW: 15 % (ref 11.5–15.5)
WBC: 8.8 10*3/uL (ref 4.0–10.5)

## 2016-08-11 LAB — BASIC METABOLIC PANEL
Anion gap: 5 (ref 5–15)
BUN: 16 mg/dL (ref 6–20)
CALCIUM: 8.4 mg/dL — AB (ref 8.9–10.3)
CO2: 31 mmol/L (ref 22–32)
CREATININE: 0.78 mg/dL (ref 0.61–1.24)
Chloride: 105 mmol/L (ref 101–111)
GFR calc Af Amer: >60 mL/min (ref >60)
GLUCOSE: 118 mg/dL — AB (ref 65–99)
Potassium: 3.5 mmol/L (ref 3.5–5.1)
Sodium: 141 mmol/L (ref 135–145)

## 2016-08-11 LAB — MISC LABCORP TEST (SEND OUT): Labcorp test code: 19588

## 2016-08-11 MED ORDER — AMIODARONE HCL 200 MG PO TABS
400.0000 mg | ORAL_TABLET | Freq: Two times a day (BID) | ORAL | Status: DC
  Administered 2016-08-11 – 2016-08-12 (×3): 400 mg via ORAL
  Filled 2016-08-11 (×3): qty 2

## 2016-08-11 MED ORDER — FUROSEMIDE 10 MG/ML IJ SOLN
40.0000 mg | Freq: Once | INTRAMUSCULAR | Status: AC
  Administered 2016-08-11: 40 mg via INTRAVENOUS
  Filled 2016-08-11: qty 4

## 2016-08-11 NOTE — Progress Notes (Signed)
The clients heart rate started running in the 120's to 140's this afternoon. Called Bhagat with Cardiology and he ordered to give his ordered Amiodarone and Lopressor early and if his rate has not come down by 2 hours to page him back. I will monitor the client closely.   Saddie Benders RN

## 2016-08-11 NOTE — Progress Notes (Addendum)
PT Cancellation Note  Patient Details Name: Gregory [name] MRN: [id number] DOB: September 29, 1929   Cancelled Treatment:    Reason Eval/Treat Not Completed: Pain limiting ability to participate. Pt reports he had 10/10 pain this morning and RN has just been able to settle it down. He does not want to move around and requests re-attempting tomorrow.   Lorriane Shire 08/11/2016, 2:14 PM

## 2016-08-11 NOTE — Progress Notes (Signed)
Patient Name: Gregory [name] Date of Encounter: 08/11/2016  Primary Cardiologist: Dr. Clearnce Hasten Problem List     Active Problems:   Hypertension   Hyperlipidemia   PAF (paroxysmal atrial fibrillation) (HCC)   CAD (coronary artery disease)   Chronic diastolic congestive heart failure (HCC)   Incarcerated inguinal hernia   Incarcerated left inguinal hernia   Preop cardiovascular exam   Malnutrition of moderate degree   Hypotension   Cardiac tamponade     Subjective   No chest pain or sob.   Inpatient Medications    Scheduled Meds: . aspirin EC  81 mg Oral Daily  . atorvastatin  40 mg Oral Daily  . enoxaparin  40 mg Subcutaneous Q24H  . feeding supplement (ENSURE ENLIVE)  237 mL Oral TID BM  . metoprolol  50 mg Oral BID  . multivitamin with minerals  1 tablet Oral Daily  . pantoprazole  40 mg Oral QHS   Continuous Infusions: . amiodarone 30 mg/hr (08/11/16 0212)   PRN Meds: acetaminophen **OR** acetaminophen, diphenhydrAMINE **OR** [DISCONTINUED] diphenhydrAMINE, fentaNYL (SUBLIMAZE) injection, hydrALAZINE, HYDROmorphone (DILAUDID) injection, ondansetron **OR** ondansetron (ZOFRAN) IV   Vital Signs    Vitals:   08/10/16 1300 08/10/16 2106 08/11/16 0414 08/11/16 0823  BP: 112/70 106/65 111/69 118/73  Pulse: 94 94 74 84  Resp: (!) 21 (!) 24 (!) 24   Temp: 98.2 F (36.8 C) 98.3 F (36.8 C) 98.3 F (36.8 C)   TempSrc: Oral Oral Oral   SpO2: 97% 96% 95%   Weight:   164 lb 12.8 oz (74.8 kg)   Height:        Intake/Output Summary (Last 24 hours) at 08/11/16 0955 Last data filed at 08/11/16 0230  Gross per 24 hour  Intake           882.31 ml  Output             1200 ml  Net          -317.69 ml   Filed Weights   08/09/16 0508 08/10/16 0500 08/11/16 0414  Weight: 167 lb 4.8 oz (75.9 kg) 167 lb 12.8 oz (76.1 kg) 164 lb 12.8 oz (74.8 kg)    Physical Exam   GEN: Frail elderly male in no acute distress.  HEENT: Grossly normal.  Neck:  Supple Cardiac: RRR Respiratory:  Diminished breath sound at base GI: S/P abd surgery. MS: no deformity or atrophy. Skin: warm and dry, no rash. Neuro:  Strength and sensation are intact. Psych: AAOx3.  Normal affect.  Labs    CBC  Recent Labs  08/10/16 0317 08/11/16 0611  WBC 6.9 8.8  HGB 10.2* 9.8*  HCT 30.0* 29.4*  MCV 84.3 84.0  PLT 189 [id number]   Basic Metabolic Panel  Recent Labs  08/10/16 0317 08/11/16 0611  NA 137 141  K 3.3* 3.5  CL 105 105  CO2 24 31  GLUCOSE 124* 118*  BUN 17 16  CREATININE 0.80 0.78  CALCIUM 8.2* 8.4*    Telemetry    Sinus - Personally Reviewed    Cardiac Studies   Echo 08/10/16  Study Conclusions  - Left ventricle: The cavity size was normal. There was severe   concentric hypertrophy. Systolic function was normal. The   estimated ejection fraction was in the range of 55% to 60%. Wall   motion was normal; there were no regional wall motion   abnormalities. The study is not technically sufficient to allow   evaluation of LV  diastolic function. - Aortic valve: Transvalvular velocity was within the normal range.   There was no stenosis. There was no regurgitation. - Mitral valve: Transvalvular velocity was within the normal range.   There was no evidence for stenosis. There was mild regurgitation. - Right ventricle: The cavity size was normal. Wall thickness was   normal. Systolic function was normal. - Tricuspid valve: There was no regurgitation. - Pericardium, extracardiac: There was no chamber collapse.   Features were not consistent with tamponade physiology. A   moderate residual pericardial effusion was identified, mostly   posterolateral. There was a left pleural effusion.  Impressions:  - Compared with echo performed 08/08/16, pericardial effusion is   slightly larger wih max distance of 2.2 cm.  Echo 08/08/16  Study Conclusions  - Left ventricle: The cavity size was normal. There was severe   concentric  hypertrophy. Systolic function was normal. The   estimated ejection fraction was in the range of 55% to 60%. Wall   motion was normal; there were no regional wall motion   abnormalities. Features are consistent with a pseudonormal left   ventricular filling pattern, with concomitant abnormal relaxation   and increased filling pressure (grade 2 diastolic dysfunction).   Doppler parameters are consistent with elevated ventricular   end-diastolic filling pressure. - Left atrium: The atrium was mildly dilated. - Right ventricle: The cavity size was normal. Wall thickness was   moderately increased. Systolic function was normal. - Right atrium: The atrium was mildly dilated.  Impressions:  - When compared to the prior study from 08/06/2016 pericardial   effusion has significantly improved, now localized inferolateral   (maximum diameter 1.8 cm) with no signs of tamponade.   Echo 08/06/16 Study Conclusions  - Left ventricle: The cavity size was normal. There was severe   concentric hypertrophy. Systolic function was vigorous. The   estimated ejection fraction was in the range of 65% to 70%. There   was dynamic obstruction, with a peak velocity of 412 cm/sec and a   peak gradient of 68 mm Hg. Wall motion was normal; there were no   regional wall motion abnormalities. - Aortic valve: There was no regurgitation. - Mitral valve: Transvalvular velocity was within the normal range.   There was no evidence for stenosis. There was no regurgitation. - Left atrium: The atrium was moderately dilated. - Right ventricle: The cavity size was normal. Wall thickness was   normal. Systolic function was normal. - Tricuspid valve: There was no regurgitation. - Pulmonary arteries: Systolic pressure was within the normal   range. PA peak pressure: 26 mm Hg (S). - Pericardium, extracardiac: A large pericardial effusion was   identified circumferential to the heart. There was right atrial   chamber  collapse. There was evidence for increased RV-LV   interaction demonstrated by respirophasic changes in transmitral   velocities. Features were consistent with tamponade physiology.  Impressions:  - Compared with the echo 03/2016, the pericardial effusion is now   much larger. Findings are consistent with tamponade. Patient Profile     Mr. Beitz is a 81 year old male with a past medical history of HTN, nonhemorrhagic CVA, GI bleed, STEMI 03/2016, CHF, PAFon Eliquis who presented to the [facility] ED with complaints of progressively worsening lower abdominal pain; s/p s/p laparoscopic repair of strangulated left inguinal hernia with phasix mesh and short segment small bowel resection. Also has pericardial effusion s/p pericardiocentesis. Also with PAF requiring IV amiodarone  Assessment & Plan  1# Pericardial effusion/Tamponade: - Initially had tamponade features requiring pericardiocentesis. Drain removed 08/08/16. FU echo showed pericardial effusion is slightly larger wih max distance of 2.2 cm (mostly posterolateral). No tamponade physiology. Will repeat echo one week.   2 # Paroxysmal atrial fibrillation:  - Back in sinus; change amiodarone to 400 BID for 2 weeks and then 200 mg daily thereafter. Would not restart apixaban for now given recent pericardial drain and effusion (risk of hemorrhagic transformation).   3 # CAD:  - No angina. Continue aspirin, atorvastatin, and metoprolol.   4 # Hypertension: - BP controlled.  Continue metoprolol.  3 # acute diastolic congestive heart failure -patient has diminished breath sounds at his bases. I will give additional Lasix 40 mg IV today.  4 # possible amyloidosis -echo is concerning for amyloid as was previous MRI. However given age no good options for therapy.  Signed, Kirk Ruths, MD  08/11/2016, 9:55 AM

## 2016-08-11 NOTE — Progress Notes (Signed)
Cards fellow notified of HR 110-131 after PO Amiodarone and Lopressor given by day shift. Ordered to obtain EKG to confirm Afib.

## 2016-08-11 NOTE — Progress Notes (Addendum)
PROGRESS NOTE    Gregory [name]  [id number] DOB: 03/15/1930 DOA: 08/05/2016 PCP: [name], MD   Chief Complaint  Patient presents with  . Abdominal Pain    Brief Narrative:  82 y.o. male presented with Abdominal pain, nausea, vomiting. Started 1 day ago. Constant with waxing and waning nature. Getting worse. Nonradiating. Has not tried anything for relief. Last bowel movement 1 day ago. Last dose about course Wednesday morning at approximately 8 AM. Poor oral intake since that time. Denies any chest pain, palpitations, shortness of breath, fever, back pain, dysuria, frequency, neck stiffness, headache, LOC, melena, hematochezia.  Consult for medical management  Assessment & Plan   Incarcerated Inguinal Hernia -Per primary team, general surgery -s/p laparoscopic repair of strangulated left inguinal hernia with phasix mesh and short segment small bowel resection -Continue pain control as needed -Tolerating diet, but has poor appetite -Nutrition consulted and appreciated  Pericardial effusion/tamponade -Cardiology consulted and appreciated -Echocardiogram EF65-70%, large pericardial effusion, incrases RV-LV interaction- consistent with tamponade physilogy -S/p pericardiocentesis, with drain placement  -Echocardiogram repeated 12/24, showing mild posterolateral effusion and drain removed -Repeat echo 12/26: pericardial effusion is slightly larger wih max distance of 2.2 cm - Dr. Stanford Breed:  repeat echo in 1 week, if it continues to increase in size, patient would need a pericardial window  Acute on chronic diastolic congestive heart failure -Last Echo showing EF of 55% and grade 1 diastolic dysfunction.  -Continue lasix per cardiology -monitor intake/output, daily weights  PAF/SVT  -Eliquis held -Continue metoprolol, cardizem discontinued  -Had Afib with RVR overnight, placed on cardizem drip by cardiology and transitioned to [facility].   -rate controlled -Would  not restart Eliquis as patient's effusion may worsen.   Hypotension -Currently BP stable -Discontinued amlodipine, cozaar, imdur  LE edema -Was on On Eliquis. R>L -Currently held due to pericardial effusion -LE doppler negative for DVT  CAD -S/p coronary artery catheterization 03/26/2016 with angioplasty.  -EKG without evidence of ACS -Continue ASA -Cardiology consulted and appreciated  Hyperlipidemia -continue statin  Deconditioning -PT and OT consulted -PT recommended SNF  DVT Prophylaxis  SCDs  Code Status: full  Family Communication: none at bedside  Disposition Plan: SNF- social work consulted. Currently, medicine not providing much.  Appreciate cardiology's input and management-- will be available PRN  Consultants [facility] Card[facility]  Procedures  laparoscopic repair of strangulated left inguinal hernia with phasix mesh and short segment small bowel resection Pericardiocentesis   Antibiotics   Anti-infectives    Start     Dose/Rate Route Frequency Ordered Stop   08/06/16 0600  ceFAZolin (ANCEF) IVPB 2g/100 mL premix  Status:  Discontinued     2 g 200 mL/hr over 30 Minutes Intravenous On call to O.R. 08/05/16 1219 08/05/16 1815   08/06/16 0600  metroNIDAZOLE (FLAGYL) IVPB 500 mg     500 mg 100 mL/hr over 60 Minutes Intravenous On call to O.R. 08/05/16 1417 08/05/16 1456   08/05/16 1445  ceFAZolin (ANCEF) IVPB 2g/100 mL premix     2 g 200 mL/hr over 30 Minutes Intravenous On call to O.R. 08/05/16 1417 08/05/16 1444      Subjective:   No overnight events-- seen by PT- SNF recommended  Objective:   Vitals:   08/10/16 1300 08/10/16 2106 08/11/16 0414 08/11/16 0823  BP: 112/70 106/65 111/69 118/73  Pulse: 94 94 74 84  Resp: (!) 21 (!) 24 (!) 24   Temp: 98.2 F (36.8 C) 98.3 F (36.8 C) 98.3 F (36.8 C)  TempSrc: Oral Oral Oral   SpO2: 97% 96% 95%   Weight:   74.8 kg (164 lb 12.8 oz)   Height:        Intake/Output Summary (Last 24 hours) at  08/11/16 1014 Last data filed at 08/11/16 0230  Gross per 24 hour  Intake           882.31 ml  Output             1200 ml  Net          -317.69 ml   Filed Weights   08/09/16 0508 08/10/16 0500 08/11/16 0414  Weight: 75.9 kg (167 lb 4.8 oz) 76.1 kg (167 lb 12.8 oz) 74.8 kg (164 lb 12.8 oz)    Exam  General: Well developed, thin, no apparent distress  Cardiovascular: S1 S2 auscultated, IRR, 2/6 SEM  Respiratory: Diminished breath sounds  Abdomen: Soft, nontender, nondistended, + bowel sounds.  Extremities: warm dry without cyanosis clubbing, edema.  Neuro: AAOx3, nonfocal  Psych: appropriate    Data Reviewed: I have personally reviewed following labs and imaging studies  CBC:  Recent Labs Lab 08/07/16 0334 08/08/16 0247 08/09/16 0340 08/10/16 0317 08/11/16 0611  WBC 7.6 6.8 6.2 6.9 8.8  HGB 10.2* 10.4* 10.9* 10.2* 9.8*  HCT 30.8* 31.8* 33.0* 30.0* 29.4*  MCV 88.3 88.6 87.1 84.3 84.0  PLT 102* 116* 124* 189 [id number]   Basic Metabolic Panel:  Recent Labs Lab 08/07/16 0334 08/08/16 0247 08/09/16 0340 08/10/16 0317 08/11/16 0611  NA 141 138 140 137 141  K 3.9 3.8 3.8 3.3* 3.5  CL 109 107 106 105 105  CO2 24 23 28 24 31   GLUCOSE 101* 113* 134* 124* 118*  BUN 21* 23* 22* 17 16  CREATININE 0.80 0.66 0.80 0.80 0.78  CALCIUM 8.5* 8.3* 8.2* 8.2* 8.4*   GFR: Estimated Creatinine Clearance: 72.7 mL/min (by C-G formula based on SCr of 0.78 mg/dL). Liver Function Tests:  Recent Labs Lab 08/05/16 0726 08/06/16 0119  AST 36 28  ALT 22 15*  ALKPHOS 87 59  BILITOT 4.0* 4.8*  PROT 7.1 5.4*  ALBUMIN 4.0 2.9*    Recent Labs Lab 08/05/16 0726  LIPASE 27   No results for input(s): AMMONIA in the last 168 hours. Coagulation Profile:  Recent Labs Lab 08/05/16 1214  INR 1.37   Cardiac Enzymes: No results for input(s): CKTOTAL, CKMB, CKMBINDEX, TROPONINI in the last 168 hours. BNP (last 3 results) No results for input(s): PROBNP in the last 8760  hours. HbA1C: No results for input(s): HGBA1C in the last 72 hours. CBG: No results for input(s): GLUCAP in the last 168 hours. Lipid Profile: No results for input(s): CHOL, HDL, LDLCALC, TRIG, CHOLHDL, LDLDIRECT in the last 72 hours. Thyroid Function Tests: No results for input(s): TSH, T4TOTAL, FREET4, T3FREE, THYROIDAB in the last 72 hours. Anemia Panel: No results for input(s): VITAMINB12, FOLATE, FERRITIN, TIBC, IRON, RETICCTPCT in the last 72 hours. Urine analysis:    Component Value Date/Time   COLORURINE YELLOW 08/05/2016 1107   APPEARANCEUR CLEAR 08/05/2016 1107   LABSPEC 1.044 (H) 08/05/2016 1107   PHURINE 6.0 08/05/2016 1107   GLUCOSEU 50 (A) 08/05/2016 1107   HGBUR NEGATIVE 08/05/2016 1107   BILIRUBINUR NEGATIVE 08/05/2016 1107   KETONESUR 20 (A) 08/05/2016 1107   PROTEINUR NEGATIVE 08/05/2016 1107   UROBILINOGEN 4.0 (H) 01/30/2014 1402   NITRITE NEGATIVE 08/05/2016 1107   LEUKOCYTESUR NEGATIVE 08/05/2016 1107      Recent Results (from the past 240  hour(s))  Body fluid culture     Status: None   Collection Time: 08/06/16  4:56 PM  Result Value Ref Range Status   Specimen Description PERICARDIAL  Final   Special Requests NONE  Final   Gram Stain   Final    RARE WBC PRESENT, PREDOMINANTLY MONONUCLEAR NO ORGANISMS SEEN    Culture NO GROWTH 3 DAYS  Final   Report Status 08/09/2016 FINAL  Final      Radiology Studies: No results found.   Scheduled Meds: . amiodarone  400 mg Oral BID  . aspirin EC  81 mg Oral Daily  . atorvastatin  40 mg Oral Daily  . enoxaparin  40 mg Subcutaneous Q24H  . feeding supplement (ENSURE ENLIVE)  237 mL Oral TID BM  . furosemide  40 mg Intravenous Once  . metoprolol  50 mg Oral BID  . multivitamin with minerals  1 tablet Oral Daily  . pantoprazole  40 mg Oral QHS   Continuous Infusions:    LOS: 6 days   Time Spent in minutes   25 minutes  Carter Kaman U Zyrus Hetland D.O. on 08/11/2016 at 10:14 AM    After 7pm go to  [email or web address] - password [email or web address]  And look for the night coverage person covering for me after hours  [facility] Office  [phone]

## 2016-08-11 NOTE — Progress Notes (Addendum)
OT Cancellation Note  Patient Details Name: Gregory [name] MRN: [id number] DOB: 1929/11/09   Cancelled Treatment:    Reason Eval/Treat Not Completed: Pain limiting ability to participate;Patient declined, no reason specified. Pt requesting therapy try back tomorrow. Will follow up for OT eval as time allows.  Binnie Kand M.S., OTR/L Pager: [phone]  08/11/2016, 2:28 PM

## 2016-08-11 NOTE — Treatment Plan (Signed)
Notified by bedside RN that pt remains in Afib with ventricular rate 110-130bpm.  Vital signs are otherwise unchanged and he is asymptomatic.  Per report, when day team was notified that pt had returned to Afib he was given 400mg  of PO Amiodarone and 50mg  of Metoprolol PO.  ECG at this time confirms that he remains in Afib.  Given that he is asymptomatic and otherwise stable, and current SBP=116mmHg, I will not redose metoprolol at this time.  Will reevaluate later this evening if needed.  Clayborne Dana MD

## 2016-08-11 NOTE — Progress Notes (Signed)
CSW met patient at bedside to offer support and discuss patients needs at discharge. CSW spoke to patient about the possibility of going to a SNF after discharge. Patient stated he did not want to make a decision on SNF unil he spoke to his son and nephew about it. CSW remains available for support and discharge needs.  Rhea Pink, MSW,  [place]

## 2016-08-11 NOTE — Progress Notes (Signed)
[facility] Progress Note  5 Days Post-Op  Subjective: Pt complaining of mild abdominal pain that started last night in the upper abdomin. Pt is having hiccups and belching a lot this morning. He is still having flatus and small BM's. No nausea or vomiting. He does not feel like eating. He thinks he would do okay with soup.   Objective: Vital signs in last 24 hours: Temp:  [98.2 F (36.8 C)-98.3 F (36.8 C)] 98.3 F (36.8 C) (12/27 0414) Pulse Rate:  [74-94] 74 (12/27 0414) Resp:  [21-24] 24 (12/27 0414) BP: (106-112)/(65-70) 111/69 (12/27 0414) SpO2:  [95 %-97 %] 95 % (12/27 0414) Weight:  [164 lb 12.8 oz (74.8 kg)] 164 lb 12.8 oz (74.8 kg) (12/27 0414) Last BM Date: 08/09/16  Intake/Output from previous day: 12/26 0701 - 12/27 0700 In: 1122.3 [P.O.:580; I.V.:542.3] Out: 1200 [Urine:1200] Intake/Output this shift: No intake/output data recorded.  PE: Gen:  Alert, NAD, pleasant, cooperative, lying in bed Card:  RRR, systolic murmur noted Pulm:  CTA, no W/R/R, effort normal Abd: Soft, ND, +BS, mild TTP of the epigastric region and just superior to midline incision, incisions C/D/I Skin: no rashes noted, warm and dry  Lab Results:   Recent Labs  08/10/16 0317 08/11/16 0611  WBC 6.9 8.8  HGB 10.2* 9.8*  HCT 30.0* 29.4*  PLT 189 200   BMET  Recent Labs  08/10/16 0317 08/11/16 0611  NA 137 141  K 3.3* 3.5  CL 105 105  CO2 24 31  GLUCOSE 124* 118*  BUN 17 16  CREATININE 0.80 0.78  CALCIUM 8.2* 8.4*   PT/INR No results for input(s): LABPROT, INR in the last 72 hours. CMP     Component Value Date/Time   NA 141 08/11/2016 0611   K 3.5 08/11/2016 0611   CL 105 08/11/2016 0611   CO2 31 08/11/2016 0611   GLUCOSE 118 (H) 08/11/2016 0611   BUN 16 08/11/2016 0611   CREATININE 0.78 08/11/2016 0611   CREATININE 0.67 (L) 1Oct 19, 202017 0825   CALCIUM 8.4 (L) 08/11/2016 0611   PROT 5.4 (L) 08/06/2016 0119   ALBUMIN 2.9 (L) 08/06/2016 0119   AST 28  08/06/2016 0119   ALT 15 (L) 08/06/2016 0119   ALKPHOS 59 08/06/2016 0119   BILITOT 4.8 (H) 08/06/2016 0119   GFRNONAA >60 08/11/2016 0611   GFRAA >60 08/11/2016 0611   Lipase     Component Value Date/Time   LIPASE 27 08/05/2016 0726       Studies/Results: No results found.  Anti-infectives: Anti-infectives    Start     Dose/Rate Route Frequency Ordered Stop   08/06/16 0600  ceFAZolin (ANCEF) IVPB 2g/100 mL premix  Status:  Discontinued     2 g 200 mL/hr over 30 Minutes Intravenous On call to O.R. 08/05/16 1219 08/05/16 1815   08/06/16 0600  metroNIDAZOLE (FLAGYL) IVPB 500 mg     500 mg 100 mL/hr over 60 Minutes Intravenous On call to O.R. 08/05/16 1417 08/05/16 1456   08/05/16 1445  ceFAZolin (ANCEF) IVPB 2g/100 mL premix     2 g 200 mL/hr over 30 Minutes Intravenous On call to O.R. 08/05/16 1417 08/05/16 1444       Assessment/Plan  s/p Procedure(s): Laparoscopic repair of incarcerated inguinal hernia with small bowel resection, 12/21, Dr. Kae Heller - Abdomen benign, very mild abdominal tenderness on exam. Appetite is poor still. Monitor by mouth intake. PT recommends SNF. Discharge planning consulted  Per Cardiology: appreciate the input 1 paroxysmal  atrial fibrillation-patient has developed recurrent atrial fibrillation. IV amiodarone & metoprolol. I will not fully anticoagulate at this point given recent pericardial drain and effusion. I would be concerned about hemorrhagic transformation. 2 pericardial effusion - s/p pericardiocentesis. slightly larger compared to 12/24. If it increases in size he would require pericardial window. 3 acute diastolic CHF - Lasix 40 mg IV yesterday.  FEN: reg diet, protonix VTE: lovenox  Discussed with pt to order his food to get items he likes. Nurse agreed to help pt order. This may help improve his PO intake.    LOS: 6 days    Kalman Drape , [facility] Surgery 08/11/2016, 8:24 AM Pager:  [phone] Consults: [phone] Mon-Fri 7:00 am-4:30 pm Sat-Sun 7:00 am-11:30 am

## 2016-08-11 NOTE — Consult Note (Signed)
   [facility] CM Inpatient Consult   08/11/2016  Gregory [name] 04-23-1930 [id number]   Patient was admitted for procedure for incarcerated inquinal hernia repair..  Chart review reveals the patient's current discharge is for skilled nursing in a facility.  No [facility] follow up assessed. Spoke with inpatient RNCM on 08/10/16 and no changes noted.  For questions, please contact:  Natividad Brood, RN BSN [facility] Liaison  [phone] business mobile phone Toll free office [phone]

## 2016-08-11 NOTE — Care Management Note (Addendum)
Case Management Note  Patient Details  Name: Gregory [name] MRN: [id number] Date of Birth: Aug 15, 1930  Subjective/Objective:   Pt admitted with Incarcerated inguinal hernia s/p exp/lap for repair, pericardial effusion. IV Amio to be transitioned to po. CSW following for SNF placement.                           Action/Plan: CM will continue to monitor for additional needs.   Expected Discharge Date:                  Expected Discharge Plan:  [place]  In-House Referral:  Clinical Social Work  Discharge planning Services  CM Consult  Post Acute Care Choice:  NA Choice offered to:  NA  DME Arranged:  N/A DME Agency:  NA  HH Arranged:  NA HH Agency:  NA  Status of Service:  Completed, signed off  If discussed at [employer] of Stay Meetings, dates discussed:  08-12-16, 08-19-16  Additional Comments: 08-20-16 Plan will be for d/c to [facility] on Sat. Monitoring new po medications for heart rate overnight. No further needs from CM. CSW assisting with disposition needs to facility.  Bethena Roys, RN 08/11/2016, 4:55 PM

## 2016-08-12 DIAGNOSIS — I959 Hypotension, unspecified: Secondary | ICD-10-CM

## 2016-08-12 LAB — BASIC METABOLIC PANEL
Anion gap: 10 (ref 5–15)
BUN: 15 mg/dL (ref 6–20)
CHLORIDE: 99 mmol/L — AB (ref 101–111)
CO2: 28 mmol/L (ref 22–32)
CREATININE: 0.8 mg/dL (ref 0.61–1.24)
Calcium: 8.5 mg/dL — ABNORMAL LOW (ref 8.9–10.3)
Glucose, Bld: 114 mg/dL — ABNORMAL HIGH (ref 65–99)
POTASSIUM: 3.5 mmol/L (ref 3.5–5.1)
SODIUM: 137 mmol/L (ref 135–145)

## 2016-08-12 MED ORDER — AMIODARONE HCL 200 MG PO TABS
400.0000 mg | ORAL_TABLET | Freq: Three times a day (TID) | ORAL | Status: DC
  Administered 2016-08-12 – 2016-08-20 (×23): 400 mg via ORAL
  Filled 2016-08-12 (×23): qty 2

## 2016-08-12 NOTE — Progress Notes (Signed)
[facility] Progress Note  6 Days Post-Op  Subjective: Pt is feeling much better today. Less pain than yesterday. Having flatus. No nausea or vomiting. Pt is looking forward to his breakfast. He is willing to work with PT today. Pt is asking when he can leave the hospital.  Objective: Vital signs in last 24 hours: Temp:  [98 F (36.7 C)-98.2 F (36.8 C)] 98.2 F (36.8 C) (12/28 0300) Pulse Rate:  [83-136] 127 (12/28 0300) Resp:  [21-24] 24 (12/28 0300) BP: (108-138)/(71-79) 109/73 (12/28 0300) SpO2:  [96 %-98 %] 97 % (12/28 0300) Weight:  [157 lb 3.2 oz (71.3 kg)] 157 lb 3.2 oz (71.3 kg) (12/28 0300) Last BM Date: 08/09/16  Intake/Output from previous day: 12/27 0701 - 12/28 0700 In: 621.7 [P.O.:480; I.V.:141.7] Out: 400 [Urine:400] Intake/Output this shift: No intake/output data recorded.  PE: Gen:  Alert, NAD, pleasant, cooperative, lying in bed Card:  RRR, systolic murmur noted Pulm:  CTA, no W/R/R, effort normal Abd: Soft, ND, +BS, mild TTP of the left inguinal region and around incisions. Incisions with minimal surrounding erythema, staples in place, no drainage, no signs of infection. Skin: no rashes noted, warm and dry   Lab Results:   Recent Labs  08/10/16 0317 08/11/16 0611  WBC 6.9 8.8  HGB 10.2* 9.8*  HCT 30.0* 29.4*  PLT 189 200   BMET  Recent Labs  08/11/16 0611 08/12/16 0313  NA 141 137  K 3.5 3.5  CL 105 99*  CO2 31 28  GLUCOSE 118* 114*  BUN 16 15  CREATININE 0.78 0.80  CALCIUM 8.4* 8.5*   PT/INR No results for input(s): LABPROT, INR in the last 72 hours. CMP     Component Value Date/Time   NA 137 08/12/2016 0313   K 3.5 08/12/2016 0313   CL 99 (L) 08/12/2016 0313   CO2 28 08/12/2016 0313   GLUCOSE 114 (H) 08/12/2016 0313   BUN 15 08/12/2016 0313   CREATININE 0.80 08/12/2016 0313   CREATININE 0.67 (L) 102/27/2017 0825   CALCIUM 8.5 (L) 08/12/2016 0313   PROT 5.4 (L) 08/06/2016 0119   ALBUMIN 2.9 (L) 08/06/2016 0119    AST 28 08/06/2016 0119   ALT 15 (L) 08/06/2016 0119   ALKPHOS 59 08/06/2016 0119   BILITOT 4.8 (H) 08/06/2016 0119   GFRNONAA >60 08/12/2016 0313   GFRAA >60 08/12/2016 0313   Lipase     Component Value Date/Time   LIPASE 27 08/05/2016 0726       Studies/Results: No results found.  Anti-infectives: Anti-infectives    Start     Dose/Rate Route Frequency Ordered Stop   08/06/16 0600  ceFAZolin (ANCEF) IVPB 2g/100 mL premix  Status:  Discontinued     2 g 200 mL/hr over 30 Minutes Intravenous On call to O.R. 08/05/16 1219 08/05/16 1815   08/06/16 0600  metroNIDAZOLE (FLAGYL) IVPB 500 mg     500 mg 100 mL/hr over 60 Minutes Intravenous On call to O.R. 08/05/16 1417 08/05/16 1456   08/05/16 1445  ceFAZolin (ANCEF) IVPB 2g/100 mL premix     2 g 200 mL/hr over 30 Minutes Intravenous On call to O.R. 08/05/16 1417 08/05/16 1444       Assessment/Plan  s/p Procedure(s): Laparoscopic repair of incarcerated inguinal hernia with small bowel resection, 12/21, Dr. Kae Heller - Abdomen benign, very mild abdominal tenderness on exam. Appetite is poor still. Monitor by mouth intake. PT recommends SNF. Discharge planning consulted.   Per Cardiology: appreciate the input  1 paroxysmal atrial fibrillation-patient has developed recurrent atrial fibrillation. IV amiodarone & metoprolol. Would not restart apixaban for now given recent pericardial drain and effusion (risk of hemorrhagic transformation).  2 pericardial effusion - s/p pericardiocentesis. slightly larger compared to 12/24. If it increases in size he would require pericardial window. Will repeat echo one week 3 acute diastolic CHF - Lasix 40 mg IV yesterday.  FEN: reg diet, protonix VTE: lovenox  Pt is clear for discharge from a surgical standpoint. Will await recommendations from internal medicine and cardiology on disposition. Appreciate the recommendations.   LOS: 7 days    Kalman Drape , [facility]  Surgery 08/12/2016, 8:48 AM Pager: [phone] Consults: [phone] Mon-Fri 7:00 am-4:30 pm Sat-Sun 7:00 am-11:30 am

## 2016-08-12 NOTE — Progress Notes (Signed)
OT Cancellation Note  Patient Details Name: Gregory [name] MRN: [id number] DOB: [birth year]   Cancelled Treatment:    Reason Eval/Treat Not Completed: Medical issues which prohibited therapy (HR 130s-140s at rest). Will hold on OT at this time and follow up as pt more medically appropriate.   Binnie Kand M.S., OTR/L Pager: [phone]  08/12/2016, 12:24 PM

## 2016-08-12 NOTE — Clinical Social Work Note (Signed)
Clinical Social Work Assessment  Patient Details  Name: Gregory [name] MRN: [id number] Date of Birth: Jan 03, 1930  Date of referral:  08/12/16               Reason for consult:  Facility Placement                Permission sought to share information with:  Facility Art therapist granted to share information::  Yes, Verbal Permission Granted  Name::        Agency::     Relationship::     Contact Information:     Housing/Transportation Living arrangements for the past 2 months:  [place] of Information:  Patient Patient Interpreter Needed:  None Criminal Activity/Legal Involvement Pertinent to Current Situation/Hospitalization:  No - Comment as needed Significant Relationships:  Adult Children, Other Family Members Lives with:  Self Do you feel safe going back to the place where you live?  No Need for family participation in patient care:  No (Coment)  Care giving concerns:  Pt lives alone and current with physical impairment.   Social Worker assessment / plan:  CSW spoke with patient concerning SNF placement at time of DC.   Employment status:  Retired Nurse, adult PT Recommendations:  [place] / Referral to community resources:  [place]  Patient/Family's Response to care:  Pt agreeable to SNF search but hopeful to return home if at all possible- states he has neighbors who can help him at home and feels like he could manage at home just fine on his own.  Patient/Family's Understanding of and Emotional Response to Diagnosis, Current Treatment, and Prognosis:  Patient has no questions or concerns regarding his prognosis/treatment plan at this time- hopeful he will be safe to return home at time of DC.  Emotional Assessment Appearance:  Appears stated age Attitude/Demeanor/Rapport:    Affect (typically observed):  Appropriate Orientation:  Oriented to Self,  Oriented to Place, Oriented to  Time, Oriented to Situation Alcohol / Substance use:  Not Applicable Psych involvement (Current and /or in the community):  No (Comment)  Discharge Needs  Concerns to be addressed:  Care Coordination Readmission within the last 30 days:  No Current discharge risk:  Physical Impairment Barriers to Discharge:  Continued Medical Work up   Jorge Ny, LCSW 08/12/2016, 1:07 PM

## 2016-08-12 NOTE — Progress Notes (Signed)
Patient Name: Gregory [name] Date of Encounter: 08/12/2016  Primary Cardiologist: Dr. Clearnce Hasten Problem List     Active Problems:   Hypertension   Hyperlipidemia   PAF (paroxysmal atrial fibrillation) (HCC)   CAD (coronary artery disease)   Chronic diastolic congestive heart failure (HCC)   Incarcerated inguinal hernia   Incarcerated left inguinal hernia   Preop cardiovascular exam   Malnutrition of moderate degree   Hypotension   Cardiac tamponade     Subjective   Feels well this am, denies chest pain. Having abdominal pain.   Inpatient Medications    Scheduled Meds: . amiodarone  400 mg Oral BID  . aspirin EC  81 mg Oral Daily  . atorvastatin  40 mg Oral Daily  . enoxaparin  40 mg Subcutaneous Q24H  . feeding supplement (ENSURE ENLIVE)  237 mL Oral TID BM  . metoprolol  50 mg Oral BID  . multivitamin with minerals  1 tablet Oral Daily  . pantoprazole  40 mg Oral QHS   Continuous Infusions:  PRN Meds: acetaminophen **OR** acetaminophen, diphenhydrAMINE **OR** [DISCONTINUED] diphenhydrAMINE, fentaNYL (SUBLIMAZE) injection, hydrALAZINE, HYDROmorphone (DILAUDID) injection, ondansetron **OR** ondansetron (ZOFRAN) IV   Vital Signs    Vitals:   08/11/16 1707 08/11/16 1819 08/11/16 2129 08/12/16 0300  BP: 119/79 108/79 117/71 109/73  Pulse:  (!) 136 (!) 125 (!) 127  Resp:   (!) 24 (!) 24  Temp:   98 F (36.7 C) 98.2 F (36.8 C)  TempSrc:   Oral Oral  SpO2:   98% 97%  Weight:    157 lb 3.2 oz (71.3 kg)  Height:        Intake/Output Summary (Last 24 hours) at 08/12/16 0900 Last data filed at 08/12/16 0026  Gross per 24 hour  Intake           621.67 ml  Output              400 ml  Net           221.67 ml   Filed Weights   08/10/16 0500 08/11/16 0414 08/12/16 0300  Weight: 167 lb 12.8 oz (76.1 kg) 164 lb 12.8 oz (74.8 kg) 157 lb 3.2 oz (71.3 kg)    Physical Exam    GEN: Frail elderly male in no acute distress.  HEENT: Grossly normal.  Neck:  Supple, no JVD, carotid bruits, or masses. Cardiac: RRR, no murmurs, rubs, or gallops. No clubbing, cyanosis, edema.  Radials/DP/PT 2+ and equal bilaterally.  Respiratory:  Respirations regular and unlabored, clear to auscultation bilaterally. GI: Soft, nontender, nondistended, BS + x 4. S/p abdominal surgery.  MS: no deformity or atrophy. Skin: warm and dry, no rash. Neuro:  Strength and sensation are intact. Psych: AAOx3.  Normal affect.  Labs    CBC  Recent Labs  08/10/16 0317 08/11/16 0611  WBC 6.9 8.8  HGB 10.2* 9.8*  HCT 30.0* 29.4*  MCV 84.3 84.0  PLT 189 [id number]   Basic Metabolic Panel  Recent Labs  08/11/16 0611 08/12/16 0313  NA 141 137  K 3.5 3.5  CL 105 99*  CO2 31 28  GLUCOSE 118* 114*  BUN 16 15  CREATININE 0.78 0.80  CALCIUM 8.4* 8.5*     Telemetry   Afib RVR with RBBB- Personally Reviewed  ECG    Afib RVR, RBBB - Personally Reviewed  Radiology    No results found.  Cardiac Studies   Echo 08/10/16  Study Conclusions  -  Left ventricle: The cavity size was normal. There was severe concentric hypertrophy. Systolic function was normal. The estimated ejection fraction was in the range of 55% to 60%. Wall motion was normal; there were no regional wall motion abnormalities. The study is not technically sufficient to allow evaluation of LV diastolic function. - Aortic valve: Transvalvular velocity was within the normal range. There was no stenosis. There was no regurgitation. - Mitral valve: Transvalvular velocity was within the normal range. There was no evidence for stenosis. There was mild regurgitation. - Right ventricle: The cavity size was normal. Wall thickness was normal. Systolic function was normal. - Tricuspid valve: There was no regurgitation. - Pericardium, extracardiac: There was no chamber collapse. Features were not consistent with tamponade physiology. A moderate residual pericardial effusion was  identified, mostly posterolateral. There was a left pleural effusion.  Impressions:  - Compared with echo performed 08/08/16, pericardial effusion is slightly larger wih max distance of 2.2 cm.  Echo 08/08/16  Study Conclusions  - Left ventricle: The cavity size was normal. There was severe concentric hypertrophy. Systolic function was normal. The estimated ejection fraction was in the range of 55% to 60%. Wall motion was normal; there were no regional wall motion abnormalities. Features are consistent with a pseudonormal left ventricular filling pattern, with concomitant abnormal relaxation and increased filling pressure (grade 2 diastolic dysfunction). Doppler parameters are consistent with elevated ventricular end-diastolic filling pressure. - Left atrium: The atrium was mildly dilated. - Right ventricle: The cavity size was normal. Wall thickness was moderately increased. Systolic function was normal. - Right atrium: The atrium was mildly dilated.  Impressions:  - When compared to the prior study from 08/06/2016 pericardial effusion has significantly improved, now localized inferolateral (maximum diameter 1.8 cm) with no signs of tamponade.   Echo 08/06/16 Study Conclusions  - Left ventricle: The cavity size was normal. There was severe concentric hypertrophy. Systolic function was vigorous. The estimated ejection fraction was in the range of 65% to 70%. There was dynamic obstruction, with a peak velocity of 412 cm/sec and a peak gradient of 68 mm Hg. Wall motion was normal; there were no regional wall motion abnormalities. - Aortic valve: There was no regurgitation. - Mitral valve: Transvalvular velocity was within the normal range. There was no evidence for stenosis. There was no regurgitation. - Left atrium: The atrium was moderately dilated. - Right ventricle: The cavity size was normal. Wall thickness  was normal. Systolic function was normal. - Tricuspid valve: There was no regurgitation. - Pulmonary arteries: Systolic pressure was within the normal range. PA peak pressure: 26 mm Hg (S). - Pericardium, extracardiac: A large pericardial effusion was identified circumferential to the heart. There was right atrial chamber collapse. There was evidence for increased RV-LV interaction demonstrated by respirophasic changes in transmitral velocities. Features were consistent with tamponade physiology.  Impressions:  - Compared with the echo 03/2016, the pericardial effusion is now much larger. Findings are consistent with tamponade.  Patient Profile     Mr. Laroche is a 82 year old male with a past medical history of HTN, nonhemorrhagic CVA, GI bleed, STEMI 03/2016, CHF, PAF on Eliquis who presented to the [facility] ED on 08/05/16 with complaints of progressively worsening lower abdominal pain; s/p laparoscopic repair of strangulated left inguinal hernia with phasix mesh and short segment small bowel resection. Also has pericardial effusion s/p pericardiocentesis. Also with PAF requiring IV amiodarone  Assessment & Plan    1. Pericardial effusion/Tamponade: Initially had tamponade features requiring pericardiocentesis.  Drain removed 08/08/16. FU echo showed pericardial effusion is slightly larger wih max distance of 2.2 cm (mostly posterolateral). No tamponade physiology. Will repeat echo one week.   2. Paroxysmal atrial fibrillation:  Would not restart apixaban for now given recent pericardial drain and effusion (risk of hemorrhagic transformation).   Had Afib overnight, rate controlled and he is asymptomatic. Continue Amiodarone.   3. CAD: s/p STEMI in Aug. 2017, medical therapy was initiated. No angina. Continue aspirin, atorvastatin, and metoprolol.   4. Hypertension: - BP controlled. Continue metoprolol.  5. Acute on chronic diastolic congestive heart failure: Appears  euvolemic.   6.  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 However given age no good options for therapy.   Signed, Arbutus Leas, NP  08/12/2016, 9:00 AM   Patient seen and examined. Agree with assessment and plan. No chest pain.  AF with RVR rate 128 - 140;  He has had several coarses of  iv amiodarone to oral to iv to oral; iv last dc'd yesterday.  Will increase oral  amiodarone short term to 400 mg q 8 hrs for 2 days then revert to 400 mg bid.  BP ranging from 95 to 125 depending on HR. If BP remains stable can then further increase metoprolol. Last echo was on 08/10/16 did not show recurrent tamponade but there was a moderate sized residual effusion mostly posterolateral in location.   Troy Sine, MD, [facility] 08/12/2016 12:24 PM

## 2016-08-12 NOTE — Progress Notes (Signed)
Physical Therapy Treatment Patient Details Name: Gregory [name] MRN: [id number] DOB: 18-Dec-1929 Today's Date: 08/12/2016    History of Present Illness PT is a 82 year old male with a history of HTN, nonhemorrhagic CVA, GI bleed, STEMI 03/2016, CHF, A. Fib on Eliquis (last dose yesterday morning) who presented to the [facility] ED with complaints of progressively worsening lower abdominal pain that started yesterday.  Pt s/p  Diagnostic laparoscopy, reduction of strangulated small bowel from left inguinal hernia, TAPP repair with phasix mesh, small bowel resection.   Also complication of tamponade symptoms, s/p pericardiocentesis.    PT Comments    Pt agreeable to OOB, however activity limited as pt's HR increased from 120s to 150s with simply transferring to recliner.  Will continue to follow and progress activity as able, however would like acceptable HR parameters.  Follow Up Recommendations  SNF     Equipment Recommendations  Rolling walker with 5" wheels    Recommendations for Other Services       Precautions / Restrictions Precautions Precautions: Fall Precaution Comments: Abdominal incision and watch HR Restrictions Weight Bearing Restrictions: No    Mobility  Bed Mobility Overal bed mobility: Needs Assistance Bed Mobility: Supine to Sit     Supine to sit: Min assist;HOB elevated     General bed mobility comments: pt needs increased time and effort, but is able to bring himself to sitting, but needs A for scooting hips closer to EOB.    Transfers Overall transfer level: Needs assistance Equipment used: Rolling walker (2 wheeled) Transfers: Sit to/from [facility] Sit to Stand: Min assist Stand pivot transfers: Min assist       General transfer comment: cues for UE use and management of RW during pivot.  pt's HR up to 150s with simply pivoting to recliner.    Ambulation/Gait                 Stairs            Wheelchair Mobility     Modified Rankin (Stroke Patients Only)       Balance Overall balance assessment: Needs assistance Sitting-balance support: No upper extremity supported;Feet supported Sitting balance-Leahy Scale: Good     Standing balance support: Bilateral upper extremity supported;During functional activity Standing balance-Leahy Scale: Poor                      Cognition Arousal/Alertness: Awake/alert Behavior During Therapy: WFL for tasks assessed/performed Overall Cognitive Status: Within Functional Limits for tasks assessed                      Exercises      General Comments        Pertinent Vitals/Pain Pain Assessment: 0-10 Pain Score: 8  Pain Location: R shd > L shd, abdominal incision Pain Descriptors / Indicators: Sore Pain Intervention(s): Monitored during session;Repositioned;RN gave pain meds during session    Home Living                      Prior Function            PT Goals (current goals can now be found in the care plan section) Acute Rehab PT Goals Patient Stated Goal: get back home PT Goal Formulation: With patient Time For Goal Achievement: 08/24/16 Potential to Achieve Goals: Fair Progress towards PT goals: Progressing toward goals    Frequency    Min 2X/week  PT Plan Frequency needs to be updated    Co-evaluation             End of Session Equipment Utilized During Treatment: Gait belt Activity Tolerance: Treatment limited secondary to medical complications (Comment) (HR up to 150s) Patient left: in chair;with call bell/phone within reach;with chair alarm set     Time: [id number] PT Time Calculation (min) (ACUTE ONLY): 32 min  Charges:  $Therapeutic Activity: 23-37 mins                    G CodesCatarina Hartshorn, [place]  [phone] 08/12/2016, 10:04 AM

## 2016-08-12 NOTE — Progress Notes (Signed)
Provided patient with SNF bed offers- he still is unsure about going home vs SNF does not want CSW to contact son he states he will speak to him tonight  Jorge Ny, [place] Social Worker [phone]

## 2016-08-12 NOTE — NC FL2 (Signed)
[place] LEVEL OF CARE SCREENING TOOL     IDENTIFICATION  Patient Name: Gregory [name] Birthdate: [birth year] Sex: male Admission Date (Current Location): 08/05/2016  [facility] and [place] Number:  Herbalist and Address:  [facility], [place] [address], [place], [place] [address]      Provider Number: [id number]  Attending Physician Name and Address:  Md Edison Pace, MD  Relative Name and Phone Number:       Current Level of Care: Hospital Recommended Level of Care: [place] Prior Approval Number:    Date Approved/Denied:   PASRR Number: [id number]  Discharge Plan: SNF    Current Diagnoses: Patient Active Problem List   Diagnosis Date Noted  . Malnutrition of moderate degree 08/06/2016  . Hypotension   . Cardiac tamponade   . Chronic diastolic congestive heart failure ([place]) 08/05/2016  . Incarcerated inguinal hernia 08/05/2016  . Incarcerated left inguinal hernia 08/05/2016  . Preop cardiovascular exam   . Hypercholesteremia   . Hypertensive heart disease   . CAD (coronary artery disease)   . LVH (left ventricular hypertrophy)   . PAF (paroxysmal atrial fibrillation) ([place])   . NSTEMI (non-ST elevated myocardial infarction) ([place]) 03/26/2016  . Acute coronary syndrome ([place])   . Chest pain   . GI bleed 10/03/2014  . H/O: CVA (cerebrovascular accident)   . CVA (cerebral infarction) 01/30/2014  . Right arm weakness 01/30/2014  . Hypertension 12/05/2010  . Hyperlipidemia 12/05/2010    Orientation RESPIRATION BLADDER Height & Weight     Self, Time, Situation, Place  Normal Continent Weight: 157 lb 3.2 oz (71.3 kg) Height:  5\' 11"  (180.3 cm)  BEHAVIORAL SYMPTOMS/MOOD NEUROLOGICAL BOWEL NUTRITION STATUS      Continent Diet (see DC summary)  AMBULATORY STATUS COMMUNICATION OF NEEDS Skin   Limited Assist Verbally                         Personal Care Assistance Level of Assistance  Bathing, Dressing  Bathing Assistance: Limited assistance   Dressing Assistance: Limited assistance     Functional Limitations Info             SPECIAL CARE FACTORS FREQUENCY  PT (By licensed PT), OT (By licensed OT)     PT Frequency: 5/wk OT Frequency: 5/wk            Contractures      Additional Factors Info  Code Status, Allergies Code Status Info: FULL Allergies Info: Sulfa Antibiotics, Ramipril, Ramipril, Sulfamethoxazole Psychotropic Info: NA         Current Medications (08/12/2016):  This is the current hospital active medication list Current Facility-Administered Medications  Medication Dose Route Frequency Provider Last Rate Last Dose  . acetaminophen (TYLENOL) tablet 650 mg  650 mg Oral Q6H PRN Kalman Drape, PA   650 mg at 08/08/16 1315   Or  . acetaminophen (TYLENOL) suppository 650 mg  650 mg Rectal Q6H PRN Kalman Drape, PA      . amiodarone (PACERONE) tablet 400 mg  400 mg Oral BID Lelon Perla, MD   400 mg at 08/12/16 0907  . aspirin EC tablet 81 mg  81 mg Oral Daily Waldemar Dickens, MD   81 mg at 08/12/16 [id number]  . atorvastatin (LIPITOR) tablet 40 mg  40 mg Oral Daily Waldemar Dickens, MD   40 mg at 08/12/16 [id number]  . diphenhydrAMINE (BENADRYL) capsule 25 mg  25 mg Oral Q6H PRN Kalman Drape, PA      . enoxaparin (LOVENOX) injection 40 mg  40 mg Subcutaneous Q24H Lelon Perla, MD   40 mg at 08/12/16 0908  . feeding supplement (ENSURE ENLIVE) (ENSURE ENLIVE) liquid 237 mL  237 mL Oral TID BM Maryann Mikhail, DO   237 mL at 08/12/16 0909  . fentaNYL (SUBLIMAZE) injection 50 mcg  50 mcg Intravenous Q1H PRN Waldemar Dickens, MD   50 mcg at 08/05/16 2123  . hydrALAZINE (APRESOLINE) injection 5-10 mg  5-10 mg Intravenous Q4H PRN Waldemar Dickens, MD      . HYDROmorphone (DILAUDID) injection 0.5 mg  0.5 mg Intravenous Q2H PRN Otilio Miu, RPH   0.5 mg at 08/12/16 0908  . metoprolol tartrate (LOPRESSOR) tablet 50 mg  50 mg Oral BID Waldemar Dickens, MD   50 mg at  08/12/16 [id number]  . multivitamin with minerals tablet 1 tablet  1 tablet Oral Daily Maryann Mikhail, DO   1 tablet at 08/12/16 0906  . ondansetron (ZOFRAN-ODT) disintegrating tablet 4 mg  4 mg Oral Q6H PRN Kalman Drape, PA       Or  . ondansetron (ZOFRAN) injection 4 mg  4 mg Intravenous Q6H PRN Kalman Drape, PA   4 mg at 08/11/16 0339  . pantoprazole (PROTONIX) EC tablet 40 mg  40 mg Oral QHS Excell Seltzer, MD   40 mg at 08/11/16 2234     Discharge Medications: Please see discharge summary for a list of discharge medications.  Relevant Imaging Results:  Relevant Lab Results:   Additional Information SS#: [id number]  Jorge Ny, LCSW

## 2016-08-13 ENCOUNTER — Inpatient Hospital Stay (HOSPITAL_COMMUNITY): Payer: Medicare Other

## 2016-08-13 DIAGNOSIS — M109 Gout, unspecified: Secondary | ICD-10-CM

## 2016-08-13 DIAGNOSIS — I313 Pericardial effusion (noninflammatory): Secondary | ICD-10-CM

## 2016-08-13 DIAGNOSIS — I3139 Other pericardial effusion (noninflammatory): Secondary | ICD-10-CM | POA: Diagnosis present

## 2016-08-13 DIAGNOSIS — E876 Hypokalemia: Secondary | ICD-10-CM

## 2016-08-13 LAB — CBC
HCT: 28 % — ABNORMAL LOW (ref 39.0–52.0)
Hemoglobin: 9.6 g/dL — ABNORMAL LOW (ref 13.0–17.0)
MCH: 28.7 pg (ref 26.0–34.0)
MCHC: 34.3 g/dL (ref 30.0–36.0)
MCV: 83.6 fL (ref 78.0–100.0)
PLATELETS: 277 10*3/uL (ref 150–400)
RBC: 3.35 MIL/uL — ABNORMAL LOW (ref 4.22–5.81)
RDW: 15 % (ref 11.5–15.5)
WBC: 14.4 10*3/uL — ABNORMAL HIGH (ref 4.0–10.5)

## 2016-08-13 LAB — ECHOCARDIOGRAM LIMITED
Height: 71 in
Weight: 2553.6 oz

## 2016-08-13 LAB — TYPE AND SCREEN
ABO/RH(D): O POS
Antibody Screen: NEGATIVE

## 2016-08-13 LAB — URIC ACID: URIC ACID, SERUM: 3 mg/dL — AB (ref 4.4–7.6)

## 2016-08-13 LAB — OCCULT BLOOD X 1 CARD TO LAB, STOOL: FECAL OCCULT BLD: POSITIVE — AB

## 2016-08-13 MED ORDER — PANTOPRAZOLE SODIUM 40 MG PO TBEC
40.0000 mg | DELAYED_RELEASE_TABLET | Freq: Two times a day (BID) | ORAL | Status: DC
  Administered 2016-08-13 – 2016-08-22 (×18): 40 mg via ORAL
  Filled 2016-08-13 (×18): qty 1

## 2016-08-13 MED ORDER — COLCHICINE 0.6 MG PO TABS
0.6000 mg | ORAL_TABLET | Freq: Every day | ORAL | Status: DC
  Administered 2016-08-14 – 2016-08-22 (×9): 0.6 mg via ORAL
  Filled 2016-08-13 (×10): qty 1

## 2016-08-13 MED ORDER — COLCHICINE 0.6 MG PO TABS: 1.2000 mg | ORAL_TABLET | Freq: Once | ORAL | Status: DC

## 2016-08-13 MED ORDER — COLCHICINE 0.6 MG PO TABS: 0.6000 mg | ORAL_TABLET | Freq: Every day | ORAL | Status: DC

## 2016-08-13 MED ORDER — POTASSIUM CHLORIDE CRYS ER 20 MEQ PO TBCR
40.0000 meq | EXTENDED_RELEASE_TABLET | Freq: Once | ORAL | Status: AC
  Administered 2016-08-13: 40 meq via ORAL
  Filled 2016-08-13: qty 2

## 2016-08-13 MED ORDER — COLCHICINE 0.6 MG PO TABS
1.2000 mg | ORAL_TABLET | Freq: Once | ORAL | Status: AC
  Administered 2016-08-13: 1.2 mg via ORAL
  Filled 2016-08-13: qty 2

## 2016-08-13 MED ORDER — METOPROLOL TARTRATE 5 MG/5ML IV SOLN
5.0000 mg | Freq: Four times a day (QID) | INTRAVENOUS | Status: DC | PRN
  Administered 2016-08-13: 5 mg via INTRAVENOUS
  Filled 2016-08-13: qty 5

## 2016-08-13 NOTE — Progress Notes (Signed)
Patient Name: Gregory [name] Date of Encounter: 08/13/2016  Primary Cardiologist: Dr. Clearnce Hasten Problem List     Active Problems:   Hypertension   Hyperlipidemia   PAF (paroxysmal atrial fibrillation) (HCC)   CAD (coronary artery disease)   Chronic diastolic congestive heart failure (H[facility])   Incarcerated inguinal hernia   Incarcerated left inguinal hernia   Preop cardiovascular exam   Malnutrition of moderate degree   Hypotension   Cardiac tamponade     Subjective   Feels better today, his abdominal pain is better. He denies chest pain and SOB.   Inpatient Medications    Scheduled Meds: . amiodarone  400 mg Oral Q8H  . aspirin EC  81 mg Oral Daily  . atorvastatin  40 mg Oral Daily  . colchicine  1.2 mg Oral Once   Followed by  . colchicine  0.6 mg Oral Daily  . enoxaparin  40 mg Subcutaneous Q24H  . feeding supplement (ENSURE ENLIVE)  237 mL Oral TID BM  . metoprolol  50 mg Oral BID  . multivitamin with minerals  1 tablet Oral Daily  . pantoprazole  40 mg Oral QHS   Continuous Infusions:  PRN Meds: acetaminophen **OR** acetaminophen, diphenhydrAMINE **OR** [DISCONTINUED] diphenhydrAMINE, fentaNYL (SUBLIMAZE) injection, hydrALAZINE, HYDROmorphone (DILAUDID) injection, ondansetron **OR** ondansetron (ZOFRAN) IV   Vital Signs    Vitals:   08/12/16 0906 08/12/16 1404 08/12/16 2042 08/13/16 0453  BP: 97/61 127/85 107/76 95/68  Pulse: (!) 129 (!) 126 (!) 120 (!) 130  Resp:  (!) 22 17 (!) 29  Temp:  98.3 F (36.8 C) 99 F (37.2 C) 98.6 F (37 C)  TempSrc:  Oral Oral Oral  SpO2:  99% 97% 97%  Weight:    159 lb 9.6 oz (72.4 kg)  Height:        Intake/Output Summary (Last 24 hours) at 08/13/16 1109 Last data filed at 08/13/16 0900  Gross per 24 hour  Intake              240 ml  Output              500 ml  Net             -260 ml   Filed Weights   08/11/16 0414 08/12/16 0300 08/13/16 0453  Weight: 164 lb 12.8 oz (74.8 kg) 157 lb 3.2 oz (71.3 kg)  159 lb 9.6 oz (72.4 kg)    Physical Exam   GEN: Elderly male in no acute distress.  HEENT: Grossly normal.  Neck: Supple, no JVD, carotid bruits, or masses. Cardiac: RRR, no murmurs, rubs, or gallops. No clubbing, cyanosis, edema.  Radials/DP/PT 2+ and equal bilaterally.  Respiratory:  Respirations regular and unlabored, clear to auscultation bilaterally. GI: Soft, nontender, nondistended, BS + x 4. MS: no deformity or atrophy. Skin: warm and dry, no rash. Neuro:  Strength and sensation are intact. Psych: AAOx3.  Normal affect.  Labs    CBC  Recent Labs  08/11/16 0611  WBC 8.8  HGB 9.8*  HCT 29.4*  MCV 84.0  PLT [id number]   Basic Metabolic Panel  Recent Labs  08/11/16 0611 08/12/16 0313  NA 141 137  K 3.5 3.5  CL 105 99*  CO2 31 28  GLUCOSE 118* 114*  BUN 16 15  CREATININE 0.78 0.80  CALCIUM 8.4* 8.5*    Telemetry    Afib, rates higher this am sustaining in the 130's - Personally Reviewed    Radiology  Dg Foot Complete Left  Result Date: 08/13/2016 CLINICAL DATA:  Pain all over left foot.  No known injury. EXAM: LEFT FOOT - COMPLETE 3+ VIEW COMPARISON:  None. FINDINGS: Hallux valgus deformity. Small plantar calcaneal spur. No acute bony abnormality. Specifically, no fracture, subluxation, or dislocation. Soft tissues are intact. IMPRESSION: No acute bony abnormality.  Hallux valgus. Electronically Signed   By: Rolm Baptise M.D.   On: 08/13/2016 09:54    Cardiac Studies   Echo 08/10/16  Study Conclusions  - Left ventricle: The cavity size was normal. There was severe concentric hypertrophy. Systolic function was normal. The estimated ejection fraction was in the range of 55% to 60%. Wall motion was normal; there were no regional wall motion abnormalities. The study is not technically sufficient to allow evaluation of LV diastolic function. - Aortic valve: Transvalvular velocity was within the normal range. There was no stenosis. There  was no regurgitation. - Mitral valve: Transvalvular velocity was within the normal range. There was no evidence for stenosis. There was mild regurgitation. - Right ventricle: The cavity size was normal. Wall thickness was normal. Systolic function was normal. - Tricuspid valve: There was no regurgitation. - Pericardium, extracardiac: There was no chamber collapse. Features were not consistent with tamponade physiology. A moderate residual pericardial effusion was identified, mostly posterolateral. There was a left pleural effusion.  Impressions:  - Compared with echo performed 08/08/16, pericardial effusion is slightly larger wih max distance of 2.2 cm.  Echo 08/08/16  Study Conclusions  - Left ventricle: The cavity size was normal. There was severe concentric hypertrophy. Systolic function was normal. The estimated ejection fraction was in the range of 55% to 60%. Wall motion was normal; there were no regional wall motion abnormalities. Features are consistent with a pseudonormal left ventricular filling pattern, with concomitant abnormal relaxation and increased filling pressure (grade 2 diastolic dysfunction). Doppler parameters are consistent with elevated ventricular end-diastolic filling pressure. - Left atrium: The atrium was mildly dilated. - Right ventricle: The cavity size was normal. Wall thickness was moderately increased. Systolic function was normal. - Right atrium: The atrium was mildly dilated.  Impressions:  - When compared to the prior study from 08/06/2016 pericardial effusion has significantly improved, now localized inferolateral (maximum diameter 1.8 cm) with no signs of tamponade.   Echo 08/06/16 Study Conclusions  - Left ventricle: The cavity size was normal. There was severe concentric hypertrophy. Systolic function was vigorous. The estimated ejection fraction was in the range of 65% to 70%.  There was dynamic obstruction, with a peak velocity of 412 cm/sec and a peak gradient of 68 mm Hg. Wall motion was normal; there were no regional wall motion abnormalities. - Aortic valve: There was no regurgitation. - Mitral valve: Transvalvular velocity was within the normal range. There was no evidence for stenosis. There was no regurgitation. - Left atrium: The atrium was moderately dilated. - Right ventricle: The cavity size was normal. Wall thickness was normal. Systolic function was normal. - Tricuspid valve: There was no regurgitation. - Pulmonary arteries: Systolic pressure was within the normal range. PA peak pressure: 26 mm Hg (S). - Pericardium, extracardiac: A large pericardial effusion was identified circumferential to the heart. There was right atrial chamber collapse. There was evidence for increased RV-LV interaction demonstrated by respirophasic changes in transmitral velocities. Features were consistent with tamponade physiology.  Impressions:  - Compared with the echo 03/2016, the pericardial effusion is now much larger. Findings are consistent with tamponade.   Patient Profile  Mr. Achee is a 82 year old male with a past medical history of HTN, nonhemorrhagic CVA, GI bleed, STEMI 03/2016, CHF, PAF on Eliquis who presented to the [facility] ED on 08/05/16 with complaints of progressively worsening lower abdominal pain; s/p laparoscopic repair of strangulated left inguinal hernia with phasix mesh and short segment small bowel resection. Also has pericardial effusion s/p pericardiocentesis. Also with PAF requiring IV amiodarone, now transitioned to po Amio    Assessment & Plan   1. Pericardial effusion/Tamponade: Initially had tamponade features requiring pericardiocentesis. Drain removed 08/08/16.   More tachycardiac this am, will get limited Echo to assess pericardial effusion.   2. Paroxysmal atrial fibrillation:  Would not restart  apixaban for now given recent pericardial drain and effusion (risk of hemorrhagic transformation).    3. CAD: s/p STEMI in Aug. 2017, medical therapy was initiated. No angina. Continue aspirin, atorvastatin, and metoprolol.   4. Hypertension: - BP controlled. Continue metoprolol.  5.Acute on chronic diastolic congestive heart failure: Appears euvolemic.   6.  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 However given age no good options for therapy.     Signed, Arbutus Leas, NP  08/13/2016, 11:09 AM    Patient seen and examined. Agree with assessment and plan. Feels slightly better today.  Amiodarone dose was increased to 400 mg q 8 hrs;  Rate now is better at 112 - 122 range c/w 130 - 140 yesterday and earlier. Continues to be in AF.  F/U limited echo to be done to re-asses effusion. K replete to 4.0 and f/u Mg.    Gregory Sine, MD, [facility] 08/13/2016 2:04 PM

## 2016-08-13 NOTE — Progress Notes (Signed)
[facility] Progress Note  7 Days Post-Op  Subjective: Pt is not having abdominal pain. He is having flatus but no BM. No nausea or vomiting. Pt is complaining of sharp left foot pain with ambulation but not at rest. Pain started yesterday. Has not had this type of pain before.    Objective: Vital signs in last 24 hours: Temp:  [98.3 F (36.8 C)-99 F (37.2 C)] 98.6 F (37 C) (12/29 0453) Pulse Rate:  [120-130] 130 (12/29 0453) Resp:  [17-29] 29 (12/29 0453) BP: (95-127)/(61-85) 95/68 (12/29 0453) SpO2:  [96 %-99 %] 97 % (12/29 0453) Weight:  [159 lb 9.6 oz (72.4 kg)] 159 lb 9.6 oz (72.4 kg) (12/29 0453) Last BM Date: 08/11/16  Intake/Output from previous day: 12/28 0701 - 12/29 0700 In: 240 [P.O.:240] Out: 500 [Urine:500] Intake/Output this shift: No intake/output data recorded.  PE: Gen: Alert, NAD, pleasant, cooperative, sitting up in the chair Card: RRR, systolic murmur noted Pulm: CTA anteriorly, no W/R/R, effort normal Abd: Soft, ND, +BS, mild TTP around incisions. Incisions with minimal surrounding erythema, staples in place, no drainage, no signs of infection. MSK: feet without erythema or edema, good ROM of ankle and toes bilaterally, left foot TTP on top of foot and ball of foot, TTP to left great MTP joint.  Skin: no rashes noted, warm and dry   Lab Results:   Recent Labs  08/11/16 0611  WBC 8.8  HGB 9.8*  HCT 29.4*  PLT 200   BMET  Recent Labs  08/11/16 0611 08/12/16 0313  NA 141 137  K 3.5 3.5  CL 105 99*  CO2 31 28  GLUCOSE 118* 114*  BUN 16 15  CREATININE 0.78 0.80  CALCIUM 8.4* 8.5*   PT/INR No results for input(s): LABPROT, INR in the last 72 hours. CMP     Component Value Date/Time   NA 137 08/12/2016 0313   K 3.5 08/12/2016 0313   CL 99 (L) 08/12/2016 0313   CO2 28 08/12/2016 0313   GLUCOSE 114 (H) 08/12/2016 0313   BUN 15 08/12/2016 0313   CREATININE 0.80 08/12/2016 0313   CREATININE 0.67 (L) 12020-02-216 0825    CALCIUM 8.5 (L) 08/12/2016 0313   PROT 5.4 (L) 08/06/2016 0119   ALBUMIN 2.9 (L) 08/06/2016 0119   AST 28 08/06/2016 0119   ALT 15 (L) 08/06/2016 0119   ALKPHOS 59 08/06/2016 0119   BILITOT 4.8 (H) 08/06/2016 0119   GFRNONAA >60 08/12/2016 0313   GFRAA >60 08/12/2016 0313   Lipase     Component Value Date/Time   LIPASE 27 08/05/2016 0726       Studies/Results: No results found.  Anti-infectives: Anti-infectives    Start     Dose/Rate Route Frequency Ordered Stop   08/06/16 0600  ceFAZolin (ANCEF) IVPB 2g/100 mL premix  Status:  Discontinued     2 g 200 mL/hr over 30 Minutes Intravenous On call to O.R. 08/05/16 1219 08/05/16 1815   08/06/16 0600  metroNIDAZOLE (FLAGYL) IVPB 500 mg     500 mg 100 mL/hr over 60 Minutes Intravenous On call to O.R. 08/05/16 1417 08/05/16 1456   08/05/16 1445  ceFAZolin (ANCEF) IVPB 2g/100 mL premix     2 g 200 mL/hr over 30 Minutes Intravenous On call to O.R. 08/05/16 1417 08/05/16 1444       Assessment/Plan  s/p Procedure(s): Laparoscopic repair of incarcerated inguinal hernia with small bowel resection, 12/21, Dr. Kae Heller - Abdomen benign, very mild abdominal tenderness on  exam.Appetite is poor still. Monitor by mouth intake. PT recommends SNF. Discharge planning consulted.   Left foot pain: could be from working with PT yesterday. xrays pending  Per Cardiology: appreciate the input 1 paroxysmal atrial fibrillation-patient has developed recurrent atrial fibrillation. IV amiodarone &metoprolol. Would not restart apixaban for now given recent pericardial drain and effusion (risk of hemorrhagic transformation).  2 pericardial effusion - s/p pericardiocentesis. slightly larger compared to 12/24. If it increases in size he would require pericardial window. Will repeat echo one week 3 acute diastolic CHF - Lasix.  FEN: reg diet, protonix VTE: lovenox  Pt is clear for discharge from a surgical standpoint. Will await recommendations  from internal medicine and cardiology on disposition. Appreciate the recommendations. PT is recommending SNF. Staples can be removed 12-14 days postop. Pending left foot xray.   LOS: 8 days    Kalman Drape , [facility] 08/13/2016, 8:15 AM Pager: [phone] Consults: [phone] Mon-Fri 7:00 am-4:30 pm Sat-Sun 7:00 am-11:30 am

## 2016-08-13 NOTE — Care Management Important Message (Signed)
Important Message  Patient Details  Name: Gregory [name] MRN: [id number] Date of Birth: 04-23-1930   Medicare Important Message Given:  Yes    Nathen May 08/13/2016, 1:56 PM

## 2016-08-13 NOTE — Progress Notes (Signed)
Called by RN with dark bloody BM-- will get CBC Stat with type and screen.  Surgery to be notified by RN as recent GI surgery-- ? Bleeding from this?  Defer imaging to general surgery

## 2016-08-13 NOTE — Evaluation (Signed)
Occupational Therapy Evaluation Patient Details Name: Gregory [name] MRN: [id number] DOB: 1929/09/22 Today's Date: 08/13/2016    History of Present Illness PT is a 82 year old male with a history of HTN, nonhemorrhagic CVA, GI bleed, STEMI 03/2016, CHF, A. Fib on Eliquis (last dose yesterday morning) who presented to the [facility] ED with complaints of progressively worsening lower abdominal pain that started yesterday.  Pt s/p  Diagnostic laparoscopy, reduction of strangulated small bowel from left inguinal hernia, TAPP repair with phasix mesh, small bowel resection.   Also complication of tamponade symptoms, s/p pericardiocentesis.   Clinical Impression   Pt reports he was independent with ADL PTA. Currently pt required mod assist +2 for stand pivot transfer from chair to bed with elevation in HR to 147 bpm; pt c/o L foot/ankle pain with weight bearing. Pt requires varying levels of assist for ADL due to R shoulder pain/limited ROM (pt reports from rotator cuff tear) and LUE decreased ROM/sensation (pt reports from prior CVA). Recommending SNF for follow up to maximize independence and safety with ADL and functional mobility prior to return home alone. Pt would benefit from continued skilled OT to address established goals.    Follow Up Recommendations  SNF;Supervision/Assistance - 24 hour    Equipment Recommendations  Other (comment) (TBD)    Recommendations for Other Services       Precautions / Restrictions Precautions Precautions: Fall Precaution Comments: Abdominal incision and watch HR Restrictions Weight Bearing Restrictions: No      Mobility Bed Mobility Overal bed mobility: Needs Assistance Bed Mobility: Sit to Supine       Sit to supine: Mod assist   General bed mobility comments: Assist for LEs back to bed and repositioning in bed. HOB flat without use of bed rails.  Transfers Overall transfer level: Needs assistance Equipment used: 2 person hand held  assist Transfers: Sit to/from [facility] Sit to Stand: Mod assist;+2 physical assistance Stand pivot transfers: Mod assist;+2 physical assistance       General transfer comment: Pt c/o significant L foot pain with weight bearing, reports he feels like he cant move LLE.    Balance Overall balance assessment: Needs assistance Sitting-balance support: Feet supported;Bilateral upper extremity supported Sitting balance-Leahy Scale: Fair   Postural control: Posterior lean (in standing) Standing balance support: Bilateral upper extremity supported Standing balance-Leahy Scale: Poor Standing balance comment: bil UE support for standing balance. Pt with posterior lean throughout transfer.                            ADL Overall ADL's : Needs assistance/impaired Eating/Feeding: Set up;Sitting   Grooming: Minimal assistance;Sitting   Upper Body Bathing: Moderate assistance;Sitting   Lower Body Bathing: Maximal assistance;Sit to/from stand   Upper Body Dressing : Minimal assistance;Sitting   Lower Body Dressing: Maximal assistance;Sit to/from stand Lower Body Dressing Details (indicate cue type and reason): Pt unable to adjust socks in sitting. Toilet Transfer: Moderate assistance;+2 for physical assistance;Stand-pivot Toilet Transfer Details (indicate cue type and reason): Simulated by stand pivot from chair to bed.         Functional mobility during ADLs: Moderate assistance;+2 for physical assistance (for stand pivot only) General ADL Comments: Pt c/o pain in L foot/ankle with weight bearing; xray pending. HR up to 147 with stand pivot transfer (RN present and assisting).      Vision Additional Comments: Appears WFL.   Perception     Praxis  Pertinent Vitals/Pain Pain Assessment: Faces Faces Pain Scale: Hurts even more Pain Location: L foot Pain Descriptors / Indicators: Sharp;Grimacing Pain Intervention(s): Monitored during  session;Limited activity within patient's tolerance;Repositioned     Hand Dominance Right   Extremity/Trunk Assessment Upper Extremity Assessment Upper Extremity Assessment: LUE deficits/detail;RUE deficits/detail RUE Deficits / Details: Limited shoulder ROM; pt reports due to rotator cuff tear LUE Deficits / Details: Poor grip strength and decreased AROM. Pt reports numbness from prior CVA. LUE Sensation: decreased light touch LUE Coordination: decreased fine motor;decreased gross motor   Lower Extremity Assessment Lower Extremity Assessment: Defer to PT evaluation   Cervical / Trunk Assessment Cervical / Trunk Assessment: Kyphotic   Communication Communication Communication: No difficulties   Cognition Arousal/Alertness: Awake/alert Behavior During Therapy: WFL for tasks assessed/performed Overall Cognitive Status: Within Functional Limits for tasks assessed                     General Comments       Exercises       Shoulder Instructions      Home Living Family/patient expects to be discharged to:: Private residence Living Arrangements: Alone Available Help at Discharge: Family;Friend(s);Available PRN/intermittently Type of Home: House Home Access: Stairs to enter Entrance Stairs-Number of Steps: 2 in front no rails, 2 in back with rails.  uses front.   Home Layout: One level     Bathroom Shower/Tub: Teacher, early years/pre: Standard     Home Equipment: Cane - single point          Prior Functioning/Environment Level of Independence: Independent with assistive device(s)                 OT Problem List: Decreased strength;Impaired balance (sitting and/or standing);Decreased knowledge of use of DME or AE;Cardiopulmonary status limiting activity;Impaired UE functional use;Pain   OT Treatment/Interventions: Self-care/ADL training;Neuromuscular education;Energy conservation;Therapeutic activities;Patient/family education;Balance  training    OT Goals(Current goals can be found in the care plan section) Acute Rehab OT Goals Patient Stated Goal: get back home OT Goal Formulation: With patient Time For Goal Achievement: 08/27/16 Potential to Achieve Goals: Good ADL Goals Pt Will Perform Grooming: with min guard assist;standing Pt Will Perform Lower Body Bathing: with min guard assist;with adaptive equipment;sit to/from stand Pt Will Perform Lower Body Dressing: with min guard assist;with adaptive equipment;sit to/from stand Pt Will Transfer to Toilet: with min guard assist;ambulating;bedside commode Pt Will Perform Toileting - Clothing Manipulation and hygiene: with min guard assist;sit to/from stand  OT Frequency: Min 2X/week   Barriers to D/C: Decreased caregiver support  pt lives alone       Co-evaluation              End of Session Nurse Communication: Mobility status  Activity Tolerance: Patient limited by pain;Treatment limited secondary to medical complications (Comment) Patient left: in bed;with call bell/phone within reach;with bed alarm set;with nursing/sitter in room   Time: [id number] OT Time Calculation (min): 18 min Charges:  OT General Charges $OT Visit: 1 Procedure OT Evaluation $OT Eval Moderate Complexity: 1 Procedure G-Codes:     Binnie Kand M.S., OTR/L Pager: [phone]  08/13/2016, 9:45 AM

## 2016-08-13 NOTE — Discharge Instructions (Signed)

## 2016-08-13 NOTE — Progress Notes (Addendum)
PROGRESS NOTE    Gregory [name]  [id number] DOB: [birth year] DOA: 08/05/2016 PCP: [name], MD   Chief Complaint  Patient presents with  . Abdominal Pain    Brief Narrative:  82 y.o. male presented with Abdominal pain, nausea, vomiting. Started 1 day ago. Constant with waxing and waning nature. Getting worse. Nonradiating. Has not tried anything for relief. Last bowel movement 1 day ago. Last dose about course Wednesday morning at approximately 8 AM. Poor oral intake since that time. Denies any chest pain, palpitations, shortness of breath, fever, back pain, dysuria, frequency, neck stiffness, headache, LOC, melena, hematochezia.  Surgical issues are stable so [facility] will take over medical care today.   Assessment & Plan   Incarcerated Inguinal Hernia -Per general surgery -s/p laparoscopic repair of strangulated left inguinal hernia with phasix mesh and short segment small bowel resection -Continue pain control as needed -Tolerating diet-- passing gas, no BM yet -Nutrition consulted and appreciated  Gout -will try dose colchicine as Cr stable -x ray negative for fracture -was trying to avoid steroids due to recent surgery -uric acid pending  Pericardial effusion/tamponade -Cardiology consulted and appreciated -Echocardiogram EF65-70%, large pericardial effusion, incrases RV-LV interaction- consistent with tamponade physiology -S/p pericardiocentesis, with drain placement  -Echocardiogram repeated 12/24, showing mild posterolateral effusion and drain removed -Repeat echo 12/26: pericardial effusion is slightly larger wih max distance of 2.2 cm - Dr. Stanford Breed:  repeat echo in 1 week, if it continues to increase in size, patient would need a pericardial window  Acute on chronic diastolic congestive heart failure -Last Echo showing EF of 55% and grade 1 diastolic dysfunction.  -Continue lasix per cardiology -monitor intake/output, daily weights  PAF/SVT  -Eliquis  held -Continue metoprolol, cardizem discontinued  -Had Afib with RVR overnight, placed on cardizem drip by cardiology and transitioned to amiondarone.   -rate NOT well controlled-- defer to cardiology -Would not restart Eliquis as patient's effusion may worsen.   Hypotension -Currently BP stable -Discontinued amlodipine, cozaar, imdur  LE edema -Was on On Eliquis. R>L -Currently held due to pericardial effusion -LE doppler negative for DVT  CAD -S/p coronary artery catheterization 03/26/2016 with angioplasty.  -EKG without evidence of ACS -Continue ASA -Cardiology consulted and appreciated  Hyperlipidemia -continue statin  Deconditioning -PT and OT consulted -PT recommended SNF    DVT Prophylaxis  SCDs  Code Status: full  Family Communication: none at bedside  Disposition Plan: SNF- social work consulted and has bed offers, not medically stable   Consultants [facility]  Procedures  laparoscopic repair of strangulated left inguinal hernia with phasix mesh and short segment small bowel resection Pericardiocentesis   Antibiotics   Anti-infectives    Start     Dose/Rate Route Frequency Ordered Stop   08/06/16 0600  ceFAZolin (ANCEF) IVPB 2g/100 mL premix  Status:  Discontinued     2 g 200 mL/hr over 30 Minutes Intravenous On call to O.R. 08/05/16 1219 08/05/16 1815   08/06/16 0600  metroNIDAZOLE (FLAGYL) IVPB 500 mg     500 mg 100 mL/hr over 60 Minutes Intravenous On call to O.R. 08/05/16 1417 08/05/16 1456   08/05/16 1445  ceFAZolin (ANCEF) IVPB 2g/100 mL premix     2 g 200 mL/hr over 30 Minutes Intravenous On call to O.R. 08/05/16 1417 08/05/16 1444      Subjective:   C/o foot pain with ambulation and palpation  Objective:   Vitals:   08/12/16 0906 08/12/16 1404 08/12/16 2042 08/13/16 0453  BP: 97/61  127/85 107/76 95/68  Pulse: (!) 129 (!) 126 (!) 120 (!) 130  Resp:  (!) 22 17 (!) 29  Temp:  98.3 F (36.8 C) 99 F (37.2 C) 98.6 F (37 C)   TempSrc:  Oral Oral Oral  SpO2:  99% 97% 97%  Weight:    72.4 kg (159 lb 9.6 oz)  Height:        Intake/Output Summary (Last 24 hours) at 08/13/16 0946 Last data filed at 08/13/16 0900  Gross per 24 hour  Intake              480 ml  Output              500 ml  Net              -20 ml   Filed Weights   08/11/16 0414 08/12/16 0300 08/13/16 0453  Weight: 74.8 kg (164 lb 12.8 oz) 71.3 kg (157 lb 3.2 oz) 72.4 kg (159 lb 9.6 oz)    Exam  General:  thin, ill appearing  Cardiovascular: S1 S2 auscultated, IRR, 2/6 SEM  Respiratory: Diminished breath sounds  Abdomen: Soft, nontender, nondistended, + bowel sounds.  Extremities: pain in left foot with palpation  Neuro: AAOx3, nonfocal  Psych: appropriate    Data Reviewed: I have personally reviewed following labs and imaging studies  CBC:  Recent Labs Lab 08/07/16 0334 08/08/16 0247 08/09/16 0340 08/10/16 0317 08/11/16 0611  WBC 7.6 6.8 6.2 6.9 8.8  HGB 10.2* 10.4* 10.9* 10.2* 9.8*  HCT 30.8* 31.8* 33.0* 30.0* 29.4*  MCV 88.3 88.6 87.1 84.3 84.0  PLT 102* 116* 124* 189 [id number]   Basic Metabolic Panel:  Recent Labs Lab 08/08/16 0247 08/09/16 0340 08/10/16 0317 08/11/16 0611 08/12/16 0313  NA 138 140 137 141 137  K 3.8 3.8 3.3* 3.5 3.5  CL 107 106 105 105 99*  CO2 23 28 24 31 28   GLUCOSE 113* 134* 124* 118* 114*  BUN 23* 22* 17 16 15   CREATININE 0.66 0.80 0.80 0.78 0.80  CALCIUM 8.3* 8.2* 8.2* 8.4* 8.5*   GFR: Estimated Creatinine Clearance: 70.4 mL/min (by C-G formula based on SCr of 0.8 mg/dL). Liver Function Tests: No results for input(s): AST, ALT, ALKPHOS, BILITOT, PROT, ALBUMIN in the last 168 hours. No results for input(s): LIPASE, AMYLASE in the last 168 hours. No results for input(s): AMMONIA in the last 168 hours. Coagulation Profile: No results for input(s): INR, PROTIME in the last 168 hours. Cardiac Enzymes: No results for input(s): CKTOTAL, CKMB, CKMBINDEX, TROPONINI in the last 168  hours. BNP (last 3 results) No results for input(s): PROBNP in the last 8760 hours. HbA1C: No results for input(s): HGBA1C in the last 72 hours. CBG: No results for input(s): GLUCAP in the last 168 hours. Lipid Profile: No results for input(s): CHOL, HDL, LDLCALC, TRIG, CHOLHDL, LDLDIRECT in the last 72 hours. Thyroid Function Tests: No results for input(s): TSH, T4TOTAL, FREET4, T3FREE, THYROIDAB in the last 72 hours. Anemia Panel: No results for input(s): VITAMINB12, FOLATE, FERRITIN, TIBC, IRON, RETICCTPCT in the last 72 hours. Urine analysis:    Component Value Date/Time   COLORURINE YELLOW 08/05/2016 1107   APPEARANCEUR CLEAR 08/05/2016 1107   LABSPEC 1.044 (H) 08/05/2016 1107   PHURINE 6.0 08/05/2016 1107   GLUCOSEU 50 (A) 08/05/2016 1107   HGBUR NEGATIVE 08/05/2016 1107   BILIRUBINUR NEGATIVE 08/05/2016 1107   KETONESUR 20 (A) 08/05/2016 1107   PROTEINUR NEGATIVE 08/05/2016 1107   UROBILINOGEN 4.0 (H) 01/30/2014  [place] 08/05/2016 [place] 08/05/2016 1107      Recent Results (from the past 240 hour(s))  Body fluid culture     Status: None   Collection Time: 08/06/16  4:56 PM  Result Value Ref Range Status   Specimen Description PERICARDIAL  Final   Special Requests NONE  Final   Gram Stain   Final    RARE WBC PRESENT, PREDOMINANTLY MONONUCLEAR NO ORGANISMS SEEN    Culture NO GROWTH 3 DAYS  Final   Report Status 08/09/2016 FINAL  Final      Radiology Studies: No results found.   Scheduled Meds: . amiodarone  400 mg Oral Q8H  . aspirin EC  81 mg Oral Daily  . atorvastatin  40 mg Oral Daily  . enoxaparin  40 mg Subcutaneous Q24H  . feeding supplement (ENSURE ENLIVE)  237 mL Oral TID BM  . metoprolol  50 mg Oral BID  . multivitamin with minerals  1 tablet Oral Daily  . pantoprazole  40 mg Oral QHS   Continuous Infusions:    LOS: 8 days   Time Spent in minutes   25 minutes  Eri Mcevers U Jakayden Cancio D.O. on 08/13/2016 at  9:46 AM    After 7pm go to [email or web address] - password [email or web address]  And look for the night coverage person covering for me after hours  [facility] Office  [phone]

## 2016-08-13 NOTE — Progress Notes (Signed)
Pt had a large loose black bowel movement with bright red blood . Vital signs stable, HR still tachy but has been anywhere from the 90s to 110s since I gave him IV lopressor (much improved from the 140s to 150s prior to administration). Pt is not in any distress. I paged Dr. Eliseo Squires to let her be made aware, orders received for cbc and type and cross. I paged the PA for surgery and she wanted me to tell Dr. Excell Seltzer. Dr. Excell Seltzer paged and informed, orders to hold lovenox recieved and H/H q8hr. Dr. Excell Seltzer stated if pt began to deteriorate, he would need to be transferred to ICU. Will closely monitor and pt stable at this time.

## 2016-08-14 DIAGNOSIS — K921 Melena: Secondary | ICD-10-CM | POA: Diagnosis not present

## 2016-08-14 DIAGNOSIS — I4891 Unspecified atrial fibrillation: Secondary | ICD-10-CM

## 2016-08-14 DIAGNOSIS — D62 Acute posthemorrhagic anemia: Secondary | ICD-10-CM | POA: Diagnosis not present

## 2016-08-14 DIAGNOSIS — E859 Amyloidosis, unspecified: Secondary | ICD-10-CM

## 2016-08-14 DIAGNOSIS — E7800 Pure hypercholesterolemia, unspecified: Secondary | ICD-10-CM

## 2016-08-14 LAB — BASIC METABOLIC PANEL
ANION GAP: 8 (ref 5–15)
BUN: 17 mg/dL (ref 6–20)
CALCIUM: 8.2 mg/dL — AB (ref 8.9–10.3)
CO2: 26 mmol/L (ref 22–32)
Chloride: 103 mmol/L (ref 101–111)
Creatinine, Ser: 0.88 mg/dL (ref 0.61–1.24)
Glucose, Bld: 112 mg/dL — ABNORMAL HIGH (ref 65–99)
Potassium: 4.5 mmol/L (ref 3.5–5.1)
SODIUM: 137 mmol/L (ref 135–145)

## 2016-08-14 LAB — HEMOGLOBIN AND HEMATOCRIT, BLOOD
HEMATOCRIT: 27.9 % — AB (ref 39.0–52.0)
Hemoglobin: 9.4 g/dL — ABNORMAL LOW (ref 13.0–17.0)

## 2016-08-14 LAB — CBC
HEMATOCRIT: 27 % — AB (ref 39.0–52.0)
Hemoglobin: 9.4 g/dL — ABNORMAL LOW (ref 13.0–17.0)
MCH: 28.7 pg (ref 26.0–34.0)
MCHC: 34.8 g/dL (ref 30.0–36.0)
MCV: 82.6 fL (ref 78.0–100.0)
PLATELETS: 300 10*3/uL (ref 150–400)
RBC: 3.27 MIL/uL — ABNORMAL LOW (ref 4.22–5.81)
RDW: 14.9 % (ref 11.5–15.5)
WBC: 15.4 10*3/uL — AB (ref 4.0–10.5)

## 2016-08-14 LAB — MAGNESIUM: MAGNESIUM: 2.1 mg/dL (ref 1.7–2.4)

## 2016-08-14 NOTE — Progress Notes (Signed)
8 Days Post-Op  Subjective: Patient had bloody, melanotic bm late yesterday.  No bm since. Remains hemodynamically stable  Objective: Vital signs in last 24 hours: Temp:  [98.2 F (36.8 C)-98.4 F (36.9 C)] 98.2 F (36.8 C) (12/29 2206) Pulse Rate:  [109-112] 109 (12/29 2206) Resp:  [20-31] 31 (12/30 0205) BP: (99-110)/(72-75) 99/75 (12/30 0205) SpO2:  [96 %-98 %] 96 % (12/30 0205) Last BM Date: 08/14/16  Intake/Output from previous day: 12/29 0701 - 12/30 0700 In: 720 [P.O.:720] Out: 800 [Urine:800] Intake/Output this shift: Total I/O In: 222 [P.O.:222] Out: -   Exam: Awake and alert Lungs clear Abdomen soft, non-tender, non-distended  Lab Results:   Recent Labs  08/13/16 1654 08/13/16 2351 08/14/16 0535  WBC 14.4*  --  15.4*  HGB 9.6* 9.4* 9.4*  HCT 28.0* 27.9* 27.0*  PLT 277  --  300   BMET  Recent Labs  08/12/16 0313 08/14/16 0535  NA 137 137  K 3.5 4.5  CL 99* 103  CO2 28 26  GLUCOSE 114* 112*  BUN 15 17  CREATININE 0.80 0.88  CALCIUM 8.5* 8.2*   PT/INR No results for input(s): LABPROT, INR in the last 72 hours. ABG No results for input(s): PHART, HCO3 in the last 72 hours.  Invalid input(s): PCO2, PO2  Studies/Results: Dg Foot Complete Left  Result Date: 08/13/2016 CLINICAL DATA:  Pain all over left foot.  No known injury. EXAM: LEFT FOOT - COMPLETE 3+ VIEW COMPARISON:  None. FINDINGS: Hallux valgus deformity. Small plantar calcaneal spur. No acute bony abnormality. Specifically, no fracture, subluxation, or dislocation. Soft tissues are intact. IMPRESSION: No acute bony abnormality.  Hallux valgus. Electronically Signed   By: Rolm Baptise M.D.   On: 08/13/2016 09:54    Anti-infectives: Anti-infectives    Start     Dose/Rate Route Frequency Ordered Stop   08/06/16 0600  ceFAZolin (ANCEF) IVPB 2g/100 mL premix  Status:  Discontinued     2 g 200 mL/hr over 30 Minutes Intravenous On call to O.R. 08/05/16 1219 08/05/16 1815   08/06/16  0600  metroNIDAZOLE (FLAGYL) IVPB 500 mg     500 mg 100 mL/hr over 60 Minutes Intravenous On call to O.R. 08/05/16 1417 08/05/16 1456   08/05/16 1445  ceFAZolin (ANCEF) IVPB 2g/100 mL premix     2 g 200 mL/hr over 30 Minutes Intravenous On call to O.R. 08/05/16 1417 08/05/16 1444      Assessment/Plan: s/p Procedure(s): Pericardiocentesis (N/A)  Hgb/Hct stable.  Suspect it was old blood from the bowel anastomosis although can not rule out ulcer.  Hopefully should subside.  Can resume lovenox tomorrow. If recurrs, may need an upper endo to r/o another source.  LOS: 9 days    Shyanne Mcclary A 08/14/2016

## 2016-08-14 NOTE — Progress Notes (Signed)
Patient stated he was having some mild nausea, patient given 4mg  Zofran IV.

## 2016-08-14 NOTE — Progress Notes (Signed)
PROGRESS NOTE    Gregory [name]  [id number] DOB: Oct 20, 1929 DOA: 08/05/2016 PCP: [name], MD   Brief Narrative:  Patient is a 82 year old gentleman history of hypertension, nonhemorrhagic CVA, history of GI bleed, coronary artery disease status post STEMI August 2017, CHF, atrial fibrillation on anticoagulation who presented to the ED with complaints of progressively worsening low abdominal pain nausea vomiting. Patient noted to have an incarcerated left inguinal hernia and underwent emergent diagnostic laparoscopy with reduction of strangulated small bowel from left inguinal hernia,TAPP repair with phasix mesh, small bowel resection per Dr. Windle Guard 08/05/2016. Patient was assessed by cardiology preoperatively for clearance a repeat 2-D echo was done postoperatively to assess for pericardial effusion and patient noted to have a large per cardiology effusion with evidence of tamponade physiology including hypotension in the setting as SVT. Patient subsequently underwent pericardiocentesis per Dr. Burt Knack 08/06/2016. Patient also noted to be in A. fib with RVR and currently on amiodarone and beta blocker. Anticoagulation on hold secondary to concern for hemorrhagic compression would recent pericardiocentesis. Cardiology following. Patient also noted to have melanotic stools 08/13/2016 as well as 08/14/2016. H&H stable. General surgery following. [facility] were asked to take over patient's care 08/13/2016.   Assessment & Plan:   Principal Problem:   Incarcerated left inguinal hernia Active Problems:   Pericardial effusion   Melanotic stools   Postoperative anemia due to acute blood loss   Hypertension   Hyperlipidemia   PAF (paroxysmal atrial fibrillation) (HCC)   CAD (coronary artery disease)   Chronic diastolic congestive heart failure (HCC)   Incarcerated inguinal hernia   Preop cardiovascular exam   Malnutrition of moderate degree   Hypotension   Cardiac tamponade  Hypokalemia  #1 incarcerated left inguinal hernia Status post diagnostic laparoscopy, reduction of strangulated small bowel from left inguinal hernia,TAPP repair with phasix mesh, small bowel resection per Dr. Windle Guard 08/05/2016. Patient with some melanotic stools may be secondary from anastomosis site. H&H stable. Per general surgery.  #2 melanotic stools/postop acute blood loss anemia Patient noted to have melanotic stools yesterday and today. H&H currently stable at 9.4. May be secondary to blood loss and anastomotic site. Patient currently on PPI twice a day. Follow for now. Per general surgery.  #3 pericardial effusion/tamponade Patient initially had tamponade features requiring pericardiocentesis per Dr. Burt Knack 08/06/2016. Drain removed 08/08/2016. 2-D echo from 08/13/2016 with moderate-sized pleural effusion without tamponade. Per cardiology.  #4 rapid atrial fibrillation Patient in A. fib tachycardic heart rate in the 130s. Currently on amiodarone 400 mg 3 times daily and metoprolol 50 mg twice a day for rate control. Given recent pericardial drain and effusion cardiology holding off on resuming apixaban due to risk of hemorrhagic compression. Per cardiology.  #5 coronary artery disease status post STEMI August 2017 Currently asymptomatic. Continue current regimen of aspirin, Lipitor, Lopressor, amiodarone. Per cardiology.  #6 hypertension Stable. Continue metoprolol.  #7 chronic diastolic heart failure Compensated. Continue aspirin, Lipitor, Lopressor.  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 Per cardiology.  #9 gout X-ray negative for fracture. Continue colchicine.  #10 hyperlipidemia Continue statin.  #11 deconditioning PT/OT. Likely need skilled nursing facility.    DVT prophylaxis: SCDs Code Status: Full Family Communication: Updated patient. No family at bedside. Disposition Plan: Likely skilled nursing facility when okay with cardiology and  general surgery.   Consultants:   Cardiology: Dr. Stanford Breed 08/05/2016  [facility] Dr. Barbaraann Faster 08/05/2016  Admitted to general surgical service on admission due to incarcerated left inguinal hernia  Procedures:   CT abdomen and pelvis 08/05/2016  X-ray of the left foot 08/13/2016  Chest x-ray 08/05/2016, 08/08/2016  2-D echo 08/13/2016, 08/10/2016, 08/08/2016, 08/06/2016  Cardiac catheterization/pericardiocentesis 08/06/2016--- per Dr. Burt Knack  Bilateral lower extremity Dopplers 08/05/2016  Diagnostic laparoscopy, reduction of strangulated small bowel from left inguinal hernia,TAPP repair with phasix mesh, small bowel resection per Dr. Windle Guard 08/05/2016  Antimicrobials:   IV Ancef perioperatively 08/05/2016 x 1     Subjective: Per nursing patient noted to have black melanotic stools yesterday and this morning. Patient states had some shortness of breath early on this morning which has since improved. Patient denies any chest pain. Patient did have some complaints of lower abdominal pain.  Objective: Vitals:   08/13/16 0453 08/13/16 1300 08/13/16 2206 08/14/16 0205  BP: 95/68  110/72 99/75  Pulse: (!) 130 (!) 112 (!) 109   Resp: (!) 29 (!) 31 20 (!) 31  Temp: 98.6 F (37 C) 98.4 F (36.9 C) 98.2 F (36.8 C)   TempSrc: Oral Oral Oral   SpO2: 97% 98% 97% 96%  Weight: 72.4 kg (159 lb 9.6 oz)     Height:        Intake/Output Summary (Last 24 hours) at 08/14/16 1120 Last data filed at 08/14/16 0931  Gross per 24 hour  Intake              702 ml  Output              800 ml  Net              -98 ml   Filed Weights   08/11/16 0414 08/12/16 0300 08/13/16 0453  Weight: 74.8 kg (164 lb 12.8 oz) 71.3 kg (157 lb 3.2 oz) 72.4 kg (159 lb 9.6 oz)    Examination:  General exam: Appears calm and comfortable  Respiratory system: Clear to auscultation. Respiratory effort normal. Cardiovascular system: Tachycardic. Irregularly irregular. No JVD, murmurs, rubs,  gallops or clicks. No pedal edema. Gastrointestinal system: Abdomen is nondistended, soft and nontender. No organomegaly or masses felt. Normal bowel sounds heard. Incision site clean dry intact. Staples intact. Central nervous system: Alert and oriented. No focal neurological deficits. Extremities: Symmetric 5 x 5 power. Skin: No rashes, lesions or ulcers Psychiatry: Judgement and insight appear normal. Mood & affect appropriate.     Data Reviewed: I have personally reviewed following labs and imaging studies  CBC:  Recent Labs Lab 08/09/16 0340 08/10/16 0317 08/11/16 0611 08/13/16 1654 08/13/16 2351 08/14/16 0535  WBC 6.2 6.9 8.8 14.4*  --  15.4*  HGB 10.9* 10.2* 9.8* 9.6* 9.4* 9.4*  HCT 33.0* 30.0* 29.4* 28.0* 27.9* 27.0*  MCV 87.1 84.3 84.0 83.6  --  82.6  PLT 124* 189 200 277  --  [id number]   Basic Metabolic Panel:  Recent Labs Lab 08/09/16 0340 08/10/16 0317 08/11/16 0611 08/12/16 0313 08/14/16 0535  NA 140 137 141 137 137  K 3.8 3.3* 3.5 3.5 4.5  CL 106 105 105 99* 103  CO2 28 24 31 28 26   GLUCOSE 134* 124* 118* 114* 112*  BUN 22* 17 16 15 17   CREATININE 0.80 0.80 0.78 0.80 0.88  CALCIUM 8.2* 8.2* 8.4* 8.5* 8.2*  MG  --   --   --   --  2.1   GFR: Estimated Creatinine Clearance: 64 mL/min (by C-G formula based on SCr of 0.88 mg/dL). Liver Function Tests: No results for input(s): AST, ALT, ALKPHOS, BILITOT, PROT, ALBUMIN in the last  168 hours. No results for input(s): LIPASE, AMYLASE in the last 168 hours. No results for input(s): AMMONIA in the last 168 hours. Coagulation Profile: No results for input(s): INR, PROTIME in the last 168 hours. Cardiac Enzymes: No results for input(s): CKTOTAL, CKMB, CKMBINDEX, TROPONINI in the last 168 hours. BNP (last 3 results) No results for input(s): PROBNP in the last 8760 hours. HbA1C: No results for input(s): HGBA1C in the last 72 hours. CBG: No results for input(s): GLUCAP in the last 168 hours. Lipid Profile: No  results for input(s): CHOL, HDL, LDLCALC, TRIG, CHOLHDL, LDLDIRECT in the last 72 hours. Thyroid Function Tests: No results for input(s): TSH, T4TOTAL, FREET4, T3FREE, THYROIDAB in the last 72 hours. Anemia Panel: No results for input(s): VITAMINB12, FOLATE, FERRITIN, TIBC, IRON, RETICCTPCT in the last 72 hours. Sepsis Labs: No results for input(s): PROCALCITON, LATICACIDVEN in the last 168 hours.  Recent Results (from the past 240 hour(s))  Body fluid culture     Status: None   Collection Time: 08/06/16  4:56 PM  Result Value Ref Range Status   Specimen Description PERICARDIAL  Final   Special Requests NONE  Final   Gram Stain   Final    RARE WBC PRESENT, PREDOMINANTLY MONONUCLEAR NO ORGANISMS SEEN    Culture NO GROWTH 3 DAYS  Final   Report Status 08/09/2016 FINAL  Final         Radiology Studies: Dg Foot Complete Left  Result Date: 08/13/2016 CLINICAL DATA:  Pain all over left foot.  No known injury. EXAM: LEFT FOOT - COMPLETE 3+ VIEW COMPARISON:  None. FINDINGS: Hallux valgus deformity. Small plantar calcaneal spur. No acute bony abnormality. Specifically, no fracture, subluxation, or dislocation. Soft tissues are intact. IMPRESSION: No acute bony abnormality.  Hallux valgus. Electronically Signed   By: Rolm Baptise M.D.   On: 08/13/2016 09:54        Scheduled Meds: . amiodarone  400 mg Oral Q8H  . aspirin EC  81 mg Oral Daily  . atorvastatin  40 mg Oral Daily  . colchicine  0.6 mg Oral Daily  . feeding supplement (ENSURE ENLIVE)  237 mL Oral TID BM  . metoprolol  50 mg Oral BID  . multivitamin with minerals  1 tablet Oral Daily  . pantoprazole  40 mg Oral BID   Continuous Infusions:   LOS: 9 days    Time spent: 14 minutes    Bearett Porcaro, MD [facility] Pager [phone]  If 7PM-7AM, please contact night-coverage [email or web address] Password [email or web address] 08/14/2016, 11:20 AM

## 2016-08-14 NOTE — Progress Notes (Signed)
Patient Name: Gregory [name] Date of Encounter: 08/14/2016  Primary Cardiologist: [facility] Problem List     Active Problems:   Hypertension   Hyperlipidemia   PAF (paroxysmal atrial fibrillation) (HCC)   CAD (coronary artery disease)   Chronic diastolic congestive heart failure ([place])   Incarcerated inguinal hernia   Incarcerated left inguinal hernia   Preop cardiovascular exam   Malnutrition of moderate degree   Hypotension   Cardiac tamponade   Pericardial effusion   Hypokalemia     Subjective   Complains of mild abdominal pain and exertional dyspnea.  Inpatient Medications    Scheduled Meds: . amiodarone  400 mg Oral Q8H  . aspirin EC  81 mg Oral Daily  . atorvastatin  40 mg Oral Daily  . colchicine  0.6 mg Oral Daily  . feeding supplement (ENSURE ENLIVE)  237 mL Oral TID BM  . metoprolol  50 mg Oral BID  . multivitamin with minerals  1 tablet Oral Daily  . pantoprazole  40 mg Oral BID   Continuous Infusions:  PRN Meds: acetaminophen **OR** acetaminophen, diphenhydrAMINE **OR** [DISCONTINUED] diphenhydrAMINE, fentaNYL (SUBLIMAZE) injection, hydrALAZINE, HYDROmorphone (DILAUDID) injection, metoprolol, ondansetron **OR** ondansetron (ZOFRAN) IV   Vital Signs    Vitals:   08/13/16 0453 08/13/16 1300 08/13/16 2206 08/14/16 0205  BP: 95/68  110/72 99/75  Pulse: (!) 130 (!) 112 (!) 109   Resp: (!) 29 (!) 31 20 (!) 31  Temp: 98.6 F (37 C) 98.4 F (36.9 C) 98.2 F (36.8 C)   TempSrc: Oral Oral Oral   SpO2: 97% 98% 97% 96%  Weight: 159 lb 9.6 oz (72.4 kg)     Height:        Intake/Output Summary (Last 24 hours) at 08/14/16 0858 Last data filed at 08/13/16 2209  Gross per 24 hour  Intake              720 ml  Output              800 ml  Net              -80 ml   Filed Weights   08/11/16 0414 08/12/16 0300 08/13/16 0453  Weight: 164 lb 12.8 oz (74.8 kg) 157 lb 3.2 oz (71.3 kg) 159 lb 9.6 oz (72.4 kg)    Physical Exam    GEN: Well  nourished, well developed, in no acute distress.  HEENT: Grossly normal.  Neck: Supple, no JVD. Cardiac: Tachycardic, irregular, no murmurs, rubs, or gallops. No clubbing, cyanosis, edema.   Respiratory:  Respirations regular and unlabored, clear to auscultation bilaterally. GI: Soft MS: no deformity or atrophy. Skin: warm and dry, no rash. Neuro:  Non focal Psych: AAOx3.  Normal affect.  Labs    CBC  Recent Labs  08/13/16 1654 08/13/16 2351 08/14/16 0535  WBC 14.4*  --  15.4*  HGB 9.6* 9.4* 9.4*  HCT 28.0* 27.9* 27.0*  MCV 83.6  --  82.6  PLT 277  --  [id number]   Basic Metabolic Panel  Recent Labs  08/12/16 0313 08/14/16 0535  NA 137 137  K 3.5 4.5  CL 99* 103  CO2 28 26  GLUCOSE 114* 112*  BUN 15 17  CREATININE 0.80 0.88  CALCIUM 8.5* 8.2*  MG  --  2.1   Liver Function Tests No results for input(s): AST, ALT, ALKPHOS, BILITOT, PROT, ALBUMIN in the last 72 hours. No results for input(s): LIPASE, AMYLASE in the last 72 hours.  Cardiac Enzymes No results for input(s): CKTOTAL, CKMB, CKMBINDEX, TROPONINI in the last 72 hours. BNP Invalid input(s): POCBNP D-Dimer No results for input(s): DDIMER in the last 72 hours. Hemoglobin A1C No results for input(s): HGBA1C in the last 72 hours. Fasting Lipid Panel No results for input(s): CHOL, HDL, LDLCALC, TRIG, CHOLHDL, LDLDIRECT in the last 72 hours. Thyroid Function Tests No results for input(s): TSH, T4TOTAL, T3FREE, THYROIDAB in the last 72 hours.  Invalid input(s): FREET3  Telemetry    Rapid atrial fibrillation - Personally Reviewed  ECG     - Personally Reviewed  Radiology    Dg Foot Complete Left  Result Date: 08/13/2016 CLINICAL DATA:  Pain all over left foot.  No known injury. EXAM: LEFT FOOT - COMPLETE 3+ VIEW COMPARISON:  None. FINDINGS: Hallux valgus deformity. Small plantar calcaneal spur. No acute bony abnormality. Specifically, no fracture, subluxation, or dislocation. Soft tissues are intact.  IMPRESSION: No acute bony abnormality.  Hallux valgus. Electronically Signed   By: Rolm Baptise M.D.   On: 08/13/2016 09:54    Cardiac Studies   Echo reviewed, moderate and stable pericardial effusion, no tamponade (08/13/16)  Patient Profile     Gregory [name] is a 82 year old male with a past medical history of HTN, nonhemorrhagic CVA, GI bleed, STEMI 03/2016, CHF, PAF on Eliquis who presented to the [facility] ED on 12/21/17with complaints of progressively worsening lower abdominal pain; s/p laparoscopic repair of strangulated left inguinal hernia with phasix mesh and short segment small bowel resection. Also has pericardial effusion s/p pericardiocentesis. Also with PAF requiring IV amiodarone, now transitioned to po Amio   Assessment & Plan    1. Pericardial effusion/Tamponade:Initially had tamponade features requiring pericardiocentesis. Drain removed 08/08/16.  Echo 08/13/16 showed stable moderate size pericardial effusion without tamponade.  2. Rapid atrial fibrillation: Would not restart apixaban for now given recent pericardial drain and effusion (risk of hemorrhagic transformation). Currently on amiodarone 400 mg tid and metoprolol 50 mg bid. BP low normal. Will continue for now as he may convert to NSR with progressive amio loading.  3.CAD: s/p STEMI in Aug. 2017, medical therapy was initiated. No angina. Continue aspirin, atorvastatin, and metoprolol.   4. Hypertension: - BP controlled. Continue metoprolol.  5.Acute on chronic diastolic congestive heart failure: Appears euvolemic.   6. 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 However given age no good options for therapy.     Signed, Kate Sable, MD  08/14/2016, 8:58 AM

## 2016-08-15 DIAGNOSIS — Z79899 Other long term (current) drug therapy: Secondary | ICD-10-CM

## 2016-08-15 DIAGNOSIS — I252 Old myocardial infarction: Secondary | ICD-10-CM

## 2016-08-15 DIAGNOSIS — K4030 Unilateral inguinal hernia, with obstruction, without gangrene, not specified as recurrent: Principal | ICD-10-CM

## 2016-08-15 DIAGNOSIS — I25118 Atherosclerotic heart disease of native coronary artery with other forms of angina pectoris: Secondary | ICD-10-CM

## 2016-08-15 LAB — BASIC METABOLIC PANEL
Anion gap: 10 (ref 5–15)
BUN: 17 mg/dL (ref 6–20)
CHLORIDE: 101 mmol/L (ref 101–111)
CO2: 27 mmol/L (ref 22–32)
Calcium: 8.3 mg/dL — ABNORMAL LOW (ref 8.9–10.3)
Creatinine, Ser: 0.98 mg/dL (ref 0.61–1.24)
GFR calc non Af Amer: >60 mL/min (ref >60)
Glucose, Bld: 91 mg/dL (ref 65–99)
POTASSIUM: 3.4 mmol/L — AB (ref 3.5–5.1)
SODIUM: 138 mmol/L (ref 135–145)

## 2016-08-15 LAB — CBC
HEMATOCRIT: 29.2 % — AB (ref 39.0–52.0)
HEMOGLOBIN: 9.6 g/dL — AB (ref 13.0–17.0)
MCH: 28.4 pg (ref 26.0–34.0)
MCHC: 32.9 g/dL (ref 30.0–36.0)
MCV: 86.4 fL (ref 78.0–100.0)
Platelets: 340 10*3/uL (ref 150–400)
RBC: 3.38 MIL/uL — AB (ref 4.22–5.81)
RDW: 15.6 % — ABNORMAL HIGH (ref 11.5–15.5)
WBC: 13.9 10*3/uL — ABNORMAL HIGH (ref 4.0–10.5)

## 2016-08-15 LAB — MAGNESIUM: MAGNESIUM: 2.2 mg/dL (ref 1.7–2.4)

## 2016-08-15 MED ORDER — MIRTAZAPINE 15 MG PO TBDP
7.5000 mg | ORAL_TABLET | Freq: Every day | ORAL | Status: DC
  Administered 2016-08-15 – 2016-08-21 (×7): 7.5 mg via ORAL
  Filled 2016-08-15 (×7): qty 0.5

## 2016-08-15 MED ORDER — METOPROLOL TARTRATE 50 MG PO TABS
75.0000 mg | ORAL_TABLET | Freq: Two times a day (BID) | ORAL | Status: DC
  Administered 2016-08-15 – 2016-08-17 (×4): 75 mg via ORAL
  Filled 2016-08-15 (×4): qty 1

## 2016-08-15 MED ORDER — METOPROLOL TARTRATE 25 MG PO TABS
25.0000 mg | ORAL_TABLET | Freq: Once | ORAL | Status: AC
  Administered 2016-08-15: 25 mg via ORAL
  Filled 2016-08-15: qty 1

## 2016-08-15 MED ORDER — OXYCODONE HCL 5 MG PO TABS: 5.0000 mg | ORAL_TABLET | Freq: Four times a day (QID) | ORAL | Status: DC | PRN

## 2016-08-15 MED ORDER — POTASSIUM CHLORIDE 20 MEQ PO PACK
40.0000 meq | PACK | ORAL | Status: AC
  Administered 2016-08-15 (×2): 40 meq via ORAL
  Filled 2016-08-15 (×2): qty 2

## 2016-08-15 NOTE — Progress Notes (Signed)
PROGRESS NOTE    Gregory [name]  [id number] DOB: Apr 05, 1930 DOA: 08/05/2016 PCP: [name], MD   Brief Narrative:  Patient is a 82 year old gentleman history of hypertension, nonhemorrhagic CVA, history of GI bleed, coronary artery disease status post STEMI August 2017, CHF, atrial fibrillation on anticoagulation who presented to the ED with complaints of progressively worsening low abdominal pain nausea vomiting. Patient noted to have an incarcerated left inguinal hernia and underwent emergent diagnostic laparoscopy with reduction of strangulated small bowel from left inguinal hernia,TAPP repair with phasix mesh, small bowel resection per Dr. Windle Guard 08/05/2016. Patient was assessed by cardiology preoperatively for clearance a repeat 2-D echo was done postoperatively to assess for pericardial effusion and patient noted to have a large per cardiology effusion with evidence of tamponade physiology including hypotension in the setting as SVT. Patient subsequently underwent pericardiocentesis per Dr. Burt Knack 08/06/2016. Patient also noted to be in A. fib with RVR and currently on amiodarone and beta blocker. Anticoagulation on hold secondary to concern for hemorrhagic compression would recent pericardiocentesis. Cardiology following. Patient also noted to have melanotic stools 08/13/2016 as well as 08/14/2016. H&H stable. General surgery following. [facility] were asked to take over patient's care 08/13/2016.   Assessment & Plan:   Principal Problem:   Incarcerated left inguinal hernia Active Problems:   Pericardial effusion   Melanotic stools   Postoperative anemia due to acute blood loss   Hypertension   Hyperlipidemia   PAF (paroxysmal atrial fibrillation) (HCC)   CAD (coronary artery disease)   Chronic diastolic congestive heart failure (HCC)   Incarcerated inguinal hernia   Preop cardiovascular exam   Malnutrition of moderate degree   Hypotension   Cardiac tamponade  Hypokalemia  #1 incarcerated left inguinal hernia Status post diagnostic laparoscopy, reduction of strangulated small bowel from left inguinal hernia,TAPP repair with phasix mesh, small bowel resection per Dr. Windle Guard 08/05/2016. Patient with some melanotic stools may be secondary from anastomosis site. H&H stable. Per general surgery.  #2 melanotic stools/postop acute blood loss anemia Patient noted to have melanotic stools yesterday. H&H currently stable at 9.6. May be secondary to blood loss and anastomotic site. Patient currently on PPI twice a day. Follow for now. Per general surgery.  #3 pericardial effusion/tamponade Patient initially had tamponade features requiring pericardiocentesis per Dr. Burt Knack 08/06/2016. Drain removed 08/08/2016. 2-D echo from 08/13/2016 with moderate-sized pleural effusion without tamponade. Per cardiology.  #4 rapid atrial fibrillation Patient in A. fib tachycardic heart rate in the 120s. Currently on amiodarone 400 mg 3 times daily and metoprolol 50 mg twice a day for rate control. Given recent pericardial drain and effusion cardiology holding off on resuming apixaban due to risk of hemorrhagic compression. Patient's metoprolol has been increased to 75 mg twice daily for better rate control her cardiology. Per cardiology.  #5 coronary artery disease status post STEMI August 2017 Currently asymptomatic. Continue current regimen of aspirin, Lipitor, Lopressor, amiodarone. Per cardiology.  #6 hypertension Stable. Continue metoprolol.  #7 chronic diastolic heart failure Compensated. Continue aspirin, Lipitor, Lopressor.  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 Per cardiology.  #9 gout X-ray negative for fracture. Continue colchicine.  #10 hyperlipidemia Continue statin.  #11 deconditioning PT/OT. Likely need skilled nursing facility.  #12 Hypokalemia Replete.    DVT prophylaxis: SCDs Code Status: Full Family Communication:  Updated patient. No family at bedside. Disposition Plan: Likely skilled nursing facility when okay with cardiology and general surgery.   Consultants:   Cardiology: Dr. Stanford Breed 08/05/2016  [facility] Dr. Barbaraann Faster 08/05/2016  Admitted to general surgical service on admission due to incarcerated left inguinal hernia  Procedures:   CT abdomen and pelvis 08/05/2016  X-ray of the left foot 08/13/2016  Chest x-ray 08/05/2016, 08/08/2016  2-D echo 08/13/2016, 08/10/2016, 08/08/2016, 08/06/2016  Cardiac catheterization/pericardiocentesis 08/06/2016--- per Dr. Burt Knack  Bilateral lower extremity Dopplers 08/05/2016  Diagnostic laparoscopy, reduction of strangulated small bowel from left inguinal hernia,TAPP repair with phasix mesh, small bowel resection per Dr. Windle Guard 08/05/2016  Antimicrobials:   IV Ancef perioperatively 08/05/2016 x 1     Subjective: Patient sitting up in chair on the telephone. Patient complaining of some shortness of breath. No chest pain. No abdominal pain. Patient noted to have black melanotic stools last night. Patient states decreased appetite. Patient feels everything he eats runs through him.   Objective: Vitals:   08/14/16 0205 08/14/16 1513 08/14/16 2148 08/15/16 0547  BP: 99/75  109/74 117/81  Pulse:   (!) 125   Resp: (!) 31  (!) 29 (!) 34  Temp:  98.2 F (36.8 C) 98.3 F (36.8 C) 98.3 F (36.8 C)  TempSrc:  Oral Oral Oral  SpO2: 96%  97% 97%  Weight:    70.2 kg (154 lb 11.2 oz)  Height:        Intake/Output Summary (Last 24 hours) at 08/15/16 1029 Last data filed at 08/15/16 0841  Gross per 24 hour  Intake              222 ml  Output              900 ml  Net             -678 ml   Filed Weights   08/12/16 0300 08/13/16 0453 08/15/16 0547  Weight: 71.3 kg (157 lb 3.2 oz) 72.4 kg (159 lb 9.6 oz) 70.2 kg (154 lb 11.2 oz)    Examination:  General exam: Appears calm and comfortable  Respiratory system: Clear to auscultation.  Respiratory effort normal. Cardiovascular system: Tachycardic. Irregularly irregular. No JVD, murmurs, rubs, gallops or clicks. No pedal edema. Gastrointestinal system: Abdomen is nondistended, soft and nontender. No organomegaly or masses felt. Normal bowel sounds heard. Incision site clean dry intact. Staples intact. Central nervous system: Alert and oriented. No focal neurological deficits. Extremities: Symmetric 5 x 5 power. Skin: No rashes, lesions or ulcers Psychiatry: Judgement and insight appear normal. Mood & affect appropriate.     Data Reviewed: I have personally reviewed following labs and imaging studies  CBC:  Recent Labs Lab 08/10/16 0317 08/11/16 0611 08/13/16 1654 08/13/16 2351 08/14/16 0535 08/15/16 0612  WBC 6.9 8.8 14.4*  --  15.4* 13.9*  HGB 10.2* 9.8* 9.6* 9.4* 9.4* 9.6*  HCT 30.0* 29.4* 28.0* 27.9* 27.0* 29.2*  MCV 84.3 84.0 83.6  --  82.6 86.4  PLT 189 200 277  --  300 123XX123   Basic Metabolic Panel:  Recent Labs Lab 08/10/16 0317 08/11/16 0611 08/12/16 0313 08/14/16 0535 08/15/16 0612  NA 137 141 137 137 138  K 3.3* 3.5 3.5 4.5 3.4*  CL 105 105 99* 103 101  CO2 24 31 28 26 27   GLUCOSE 124* 118* 114* 112* 91  BUN 17 16 15 17 17   CREATININE 0.80 0.78 0.80 0.88 0.98  CALCIUM 8.2* 8.4* 8.5* 8.2* 8.3*  MG  --   --   --  2.1 2.2   GFR: Estimated Creatinine Clearance: 55.7 mL/min (by C-G formula based on SCr of 0.98 mg/dL). Liver  Function Tests: No results for input(s): AST, ALT, ALKPHOS, BILITOT, PROT, ALBUMIN in the last 168 hours. No results for input(s): LIPASE, AMYLASE in the last 168 hours. No results for input(s): AMMONIA in the last 168 hours. Coagulation Profile: No results for input(s): INR, PROTIME in the last 168 hours. Cardiac Enzymes: No results for input(s): CKTOTAL, CKMB, CKMBINDEX, TROPONINI in the last 168 hours. BNP (last 3 results) No results for input(s): PROBNP in the last 8760 hours. HbA1C: No results for input(s):  HGBA1C in the last 72 hours. CBG: No results for input(s): GLUCAP in the last 168 hours. Lipid Profile: No results for input(s): CHOL, HDL, LDLCALC, TRIG, CHOLHDL, LDLDIRECT in the last 72 hours. Thyroid Function Tests: No results for input(s): TSH, T4TOTAL, FREET4, T3FREE, THYROIDAB in the last 72 hours. Anemia Panel: No results for input(s): VITAMINB12, FOLATE, FERRITIN, TIBC, IRON, RETICCTPCT in the last 72 hours. Sepsis Labs: No results for input(s): PROCALCITON, LATICACIDVEN in the last 168 hours.  Recent Results (from the past 240 hour(s))  Body fluid culture     Status: None   Collection Time: 08/06/16  4:56 PM  Result Value Ref Range Status   Specimen Description PERICARDIAL  Final   Special Requests NONE  Final   Gram Stain   Final    RARE WBC PRESENT, PREDOMINANTLY MONONUCLEAR NO ORGANISMS SEEN    Culture NO GROWTH 3 DAYS  Final   Report Status 08/09/2016 FINAL  Final         Radiology Studies: No results found.      Scheduled Meds: . amiodarone  400 mg Oral Q8H  . aspirin EC  81 mg Oral Daily  . atorvastatin  40 mg Oral Daily  . colchicine  0.6 mg Oral Daily  . feeding supplement (ENSURE ENLIVE)  237 mL Oral TID BM  . metoprolol tartrate  25 mg Oral Once  . metoprolol  75 mg Oral BID  . mirtazapine  7.5 mg Oral QHS  . multivitamin with minerals  1 tablet Oral Daily  . pantoprazole  40 mg Oral BID  . potassium chloride  40 mEq Oral Q4H   Continuous Infusions:   LOS: 10 days    Time spent: 32 minutes    Minnah Llamas, MD [facility] Pager [phone]  If 7PM-7AM, please contact night-coverage [email or web address] Password [email or web address] 08/15/2016, 10:29 AM

## 2016-08-15 NOTE — Progress Notes (Signed)
PROGRESS NOTE    Gregory [name]  [id number] DOB: 07-05-1930 DOA: 08/05/2016 PCP: [name], MD   Brief Narrative:  Patient is a 82 year old gentleman history of hypertension, nonhemorrhagic CVA, history of GI bleed, coronary artery disease status post STEMI August 2017, CHF, atrial fibrillation on anticoagulation who presented to the ED with complaints of progressively worsening low abdominal pain nausea vomiting. Patient noted to have an incarcerated left inguinal hernia and underwent emergent diagnostic laparoscopy with reduction of strangulated small bowel from left inguinal hernia,TAPP repair with phasix mesh, small bowel resection per Dr. Windle Guard 08/05/2016. Patient was assessed by cardiology preoperatively for clearance a repeat 2-D echo was done postoperatively to assess for pericardial effusion and patient noted to have a large per cardiology effusion with evidence of tamponade physiology including hypotension in the setting as SVT. Patient subsequently underwent pericardiocentesis per Dr. Burt Knack 08/06/2016. Patient also noted to be in A. fib with RVR and currently on amiodarone and beta blocker. Anticoagulation on hold secondary to concern for hemorrhagic compression would recent pericardiocentesis. Cardiology following. Patient also noted to have melanotic stools 08/13/2016 as well as 08/14/2016. H&H stable. General surgery following. [facility] were asked to take over patient's care 08/13/2016.   Assessment & Plan:   Principal Problem:   Incarcerated left inguinal hernia Active Problems:   Pericardial effusion   Melanotic stools   Postoperative anemia due to acute blood loss   Hypertension   Hyperlipidemia   PAF (paroxysmal atrial fibrillation) (HCC)   CAD (coronary artery disease)   Chronic diastolic congestive heart failure (HCC)   Incarcerated inguinal hernia   Preop cardiovascular exam   Malnutrition of moderate degree   Hypotension   Cardiac tamponade  Hypokalemia  #1 incarcerated left inguinal hernia Status post diagnostic laparoscopy, reduction of strangulated small bowel from left inguinal hernia,TAPP repair with phasix mesh, small bowel resection per Dr. Windle Guard 08/05/2016. Patient with some melanotic stools may be secondary from anastomosis site. H&H stable. Per general surgery.  #2 melanotic stools/postop acute blood loss anemia Patient noted to have melanotic stools yesterday. H&H currently stable at 9.6. May be secondary to blood loss and anastomotic site. Patient currently on PPI twice a day. Follow for now. Per general surgery.  #3 pericardial effusion/tamponade Patient initially had tamponade features requiring pericardiocentesis per Dr. Burt Knack 08/06/2016. Drain removed 08/08/2016. 2-D echo from 08/13/2016 with moderate-sized pleural effusion without tamponade. Per cardiology.  #4 rapid atrial fibrillation Patient in A. fib tachycardic heart rate in the 120s. Currently on amiodarone 400 mg 3 times daily and metoprolol 50 mg twice a day for rate control. Given recent pericardial drain and effusion cardiology holding off on resuming apixaban due to risk of hemorrhagic compression. Patient's metoprolol has been increased to 75 mg twice daily for better rate control her cardiology. Per cardiology.  #5 coronary artery disease status post STEMI August 2017 Currently asymptomatic. Continue current regimen of aspirin, Lipitor, Lopressor, amiodarone. Per cardiology.  #6 hypertension Stable. Continue metoprolol.  #7 chronic diastolic heart failure Compensated. Continue aspirin, Lipitor, Lopressor.  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 Per cardiology.  #9 gout X-ray negative for fracture. Continue colchicine.  #10 hyperlipidemia Continue statin.  #11 deconditioning PT/OT. Likely need skilled nursing facility.    DVT prophylaxis: SCDs Code Status: Full Family Communication: Updated patient. No family at  bedside. Disposition Plan: Likely skilled nursing facility when okay with cardiology and general surgery.   Consultants:   Cardiology: Dr. Stanford Breed 08/05/2016  [facility] Dr. Barbaraann Faster 08/05/2016  Admitted to general surgical service on admission due to incarcerated left inguinal hernia  Procedures:   CT abdomen and pelvis 08/05/2016  X-ray of the left foot 08/13/2016  Chest x-ray 08/05/2016, 08/08/2016  2-D echo 08/13/2016, 08/10/2016, 08/08/2016, 08/06/2016  Cardiac catheterization/pericardiocentesis 08/06/2016--- per Dr. Burt Knack  Bilateral lower extremity Dopplers 08/05/2016  Diagnostic laparoscopy, reduction of strangulated small bowel from left inguinal hernia,TAPP repair with phasix mesh, small bowel resection per Dr. Windle Guard 08/05/2016  Antimicrobials:   IV Ancef perioperatively 08/05/2016 x 1     Subjective: Patient sitting up in chair on the telephone. Patient complaining of some shortness of breath. No chest pain. No abdominal pain. Patient noted to have black melanotic stools last night. Patient states decreased appetite. Patient feels everything he eats runs through him.   Objective: Vitals:   08/14/16 0205 08/14/16 1513 08/14/16 2148 08/15/16 0547  BP: 99/75  109/74 117/81  Pulse:   (!) 125   Resp: (!) 31  (!) 29 (!) 34  Temp:  98.2 F (36.8 C) 98.3 F (36.8 C) 98.3 F (36.8 C)  TempSrc:  Oral Oral Oral  SpO2: 96%  97% 97%  Weight:    70.2 kg (154 lb 11.2 oz)  Height:        Intake/Output Summary (Last 24 hours) at 08/15/16 1006 Last data filed at 08/15/16 0841  Gross per 24 hour  Intake              222 ml  Output              900 ml  Net             -678 ml   Filed Weights   08/12/16 0300 08/13/16 0453 08/15/16 0547  Weight: 71.3 kg (157 lb 3.2 oz) 72.4 kg (159 lb 9.6 oz) 70.2 kg (154 lb 11.2 oz)    Examination:  General exam: Appears calm and comfortable  Respiratory system: Clear to auscultation. Respiratory effort  normal. Cardiovascular system: Tachycardic. Irregularly irregular. No JVD, murmurs, rubs, gallops or clicks. No pedal edema. Gastrointestinal system: Abdomen is nondistended, soft and nontender. No organomegaly or masses felt. Normal bowel sounds heard. Incision site clean dry intact. Staples intact. Central nervous system: Alert and oriented. No focal neurological deficits. Extremities: Symmetric 5 x 5 power. Skin: No rashes, lesions or ulcers Psychiatry: Judgement and insight appear normal. Mood & affect appropriate.     Data Reviewed: I have personally reviewed following labs and imaging studies  CBC:  Recent Labs Lab 08/10/16 0317 08/11/16 0611 08/13/16 1654 08/13/16 2351 08/14/16 0535 08/15/16 0612  WBC 6.9 8.8 14.4*  --  15.4* 13.9*  HGB 10.2* 9.8* 9.6* 9.4* 9.4* 9.6*  HCT 30.0* 29.4* 28.0* 27.9* 27.0* 29.2*  MCV 84.3 84.0 83.6  --  82.6 86.4  PLT 189 200 277  --  300 [id number]   Basic Metabolic Panel:  Recent Labs Lab 08/10/16 0317 08/11/16 0611 08/12/16 0313 08/14/16 0535 08/15/16 0612  NA 137 141 137 137 138  K 3.3* 3.5 3.5 4.5 3.4*  CL 105 105 99* 103 101  CO2 24 31 28 26 27   GLUCOSE 124* 118* 114* 112* 91  BUN 17 16 15 17 17   CREATININE 0.80 0.78 0.80 0.88 0.98  CALCIUM 8.2* 8.4* 8.5* 8.2* 8.3*  MG  --   --   --  2.1 2.2   GFR: Estimated Creatinine Clearance: 55.7 mL/min (by C-G formula based on SCr of 0.98 mg/dL). Liver Function Tests: No results for  input(s): AST, ALT, ALKPHOS, BILITOT, PROT, ALBUMIN in the last 168 hours. No results for input(s): LIPASE, AMYLASE in the last 168 hours. No results for input(s): AMMONIA in the last 168 hours. Coagulation Profile: No results for input(s): INR, PROTIME in the last 168 hours. Cardiac Enzymes: No results for input(s): CKTOTAL, CKMB, CKMBINDEX, TROPONINI in the last 168 hours. BNP (last 3 results) No results for input(s): PROBNP in the last 8760 hours. HbA1C: No results for input(s): HGBA1C in the last 72  hours. CBG: No results for input(s): GLUCAP in the last 168 hours. Lipid Profile: No results for input(s): CHOL, HDL, LDLCALC, TRIG, CHOLHDL, LDLDIRECT in the last 72 hours. Thyroid Function Tests: No results for input(s): TSH, T4TOTAL, FREET4, T3FREE, THYROIDAB in the last 72 hours. Anemia Panel: No results for input(s): VITAMINB12, FOLATE, FERRITIN, TIBC, IRON, RETICCTPCT in the last 72 hours. Sepsis Labs: No results for input(s): PROCALCITON, LATICACIDVEN in the last 168 hours.  Recent Results (from the past 240 hour(s))  Body fluid culture     Status: None   Collection Time: 08/06/16  4:56 PM  Result Value Ref Range Status   Specimen Description PERICARDIAL  Final   Special Requests NONE  Final   Gram Stain   Final    RARE WBC PRESENT, PREDOMINANTLY MONONUCLEAR NO ORGANISMS SEEN    Culture NO GROWTH 3 DAYS  Final   Report Status 08/09/2016 FINAL  Final         Radiology Studies: No results found.      Scheduled Meds: . amiodarone  400 mg Oral Q8H  . aspirin EC  81 mg Oral Daily  . atorvastatin  40 mg Oral Daily  . colchicine  0.6 mg Oral Daily  . feeding supplement (ENSURE ENLIVE)  237 mL Oral TID BM  . metoprolol tartrate  25 mg Oral Once  . metoprolol  75 mg Oral BID  . multivitamin with minerals  1 tablet Oral Daily  . pantoprazole  40 mg Oral BID  . potassium chloride  40 mEq Oral Q4H   Continuous Infusions:   LOS: 10 days    Time spent: 83 minutes    Kimberli Winne, MD [facility] Pager [phone]  If 7PM-7AM, please contact night-coverage [email or web address] Password [email or web address] 08/15/2016, 10:06 AM

## 2016-08-15 NOTE — Progress Notes (Signed)
Patient Name: Gregory [name] Date of Encounter: 08/15/2016  Primary Cardiologist: [facility] Problem List     Principal Problem:   Incarcerated left inguinal hernia Active Problems:   Hypertension   Hyperlipidemia   PAF (paroxysmal atrial fibrillation) (HCC)   CAD (coronary artery disease)   Chronic diastolic congestive heart failure (HCC)   Incarcerated inguinal hernia   Preop cardiovascular exam   Malnutrition of moderate degree   Hypotension   Cardiac tamponade   Pericardial effusion   Hypokalemia   Melanotic stools   Postoperative anemia due to acute blood loss     Subjective   SOB with exertion.  Inpatient Medications    Scheduled Meds: . amiodarone  400 mg Oral Q8H  . aspirin EC  81 mg Oral Daily  . atorvastatin  40 mg Oral Daily  . colchicine  0.6 mg Oral Daily  . feeding supplement (ENSURE ENLIVE)  237 mL Oral TID BM  . metoprolol tartrate  25 mg Oral Once  . metoprolol  75 mg Oral BID  . multivitamin with minerals  1 tablet Oral Daily  . pantoprazole  40 mg Oral BID  . potassium chloride  40 mEq Oral Q4H   Continuous Infusions:  PRN Meds: acetaminophen **OR** acetaminophen, diphenhydrAMINE **OR** [DISCONTINUED] diphenhydrAMINE, fentaNYL (SUBLIMAZE) injection, hydrALAZINE, HYDROmorphone (DILAUDID) injection, metoprolol, ondansetron **OR** ondansetron (ZOFRAN) IV, oxyCODONE   Vital Signs    Vitals:   08/14/16 0205 08/14/16 1513 08/14/16 2148 08/15/16 0547  BP: 99/75  109/74 117/81  Pulse:   (!) 125   Resp: (!) 31  (!) 29 (!) 34  Temp:  98.2 F (36.8 C) 98.3 F (36.8 C) 98.3 F (36.8 C)  TempSrc:  Oral Oral Oral  SpO2: 96%  97% 97%  Weight:    154 lb 11.2 oz (70.2 kg)  Height:        Intake/Output Summary (Last 24 hours) at 08/15/16 0942 Last data filed at 08/15/16 0841  Gross per 24 hour  Intake              222 ml  Output              900 ml  Net             -678 ml   Filed Weights   08/12/16 0300 08/13/16 0453 08/15/16 0547    Weight: 157 lb 3.2 oz (71.3 kg) 159 lb 9.6 oz (72.4 kg) 154 lb 11.2 oz (70.2 kg)    Physical Exam   GEN: Well nourished, well developed, in no acute distress.  HEENT: Grossly normal.  Neck: Supple, no JVD. Cardiac: Tachycardic, irregular, no murmurs, rubs, or gallops. No clubbing, cyanosis, edema.   Respiratory:  Respirations regular and unlabored, clear to auscultation bilaterally. GI: Soft MS: no deformity or atrophy. Skin: warm and dry, no rash. Neuro:  Non focal Psych: AAO.  Normal affect.  Labs    CBC  Recent Labs  08/14/16 0535 08/15/16 0612  WBC 15.4* 13.9*  HGB 9.4* 9.6*  HCT 27.0* 29.2*  MCV 82.6 86.4  PLT 300 [id number]   Basic Metabolic Panel  Recent Labs  08/14/16 0535 08/15/16 0612  NA 137 138  K 4.5 3.4*  CL 103 101  CO2 26 27  GLUCOSE 112* 91  BUN 17 17  CREATININE 0.88 0.98  CALCIUM 8.2* 8.3*  MG 2.1 2.2   Liver Function Tests No results for input(s): AST, ALT, ALKPHOS, BILITOT, PROT, ALBUMIN in the last 72 hours.  No results for input(s): LIPASE, AMYLASE in the last 72 hours. Cardiac Enzymes No results for input(s): CKTOTAL, CKMB, CKMBINDEX, TROPONINI in the last 72 hours. BNP Invalid input(s): POCBNP D-Dimer No results for input(s): DDIMER in the last 72 hours. Hemoglobin A1C No results for input(s): HGBA1C in the last 72 hours. Fasting Lipid Panel No results for input(s): CHOL, HDL, LDLCALC, TRIG, CHOLHDL, LDLDIRECT in the last 72 hours. Thyroid Function Tests No results for input(s): TSH, T4TOTAL, T3FREE, THYROIDAB in the last 72 hours.  Invalid input(s): FREET3  Telemetry      Rapid atrial fibrillation - Personally Reviewed  ECG     - Personally Reviewed  Radiology    Dg Foot Complete Left  Result Date: 08/13/2016 CLINICAL DATA:  Pain all over left foot.  No known injury. EXAM: LEFT FOOT - COMPLETE 3+ VIEW COMPARISON:  None. FINDINGS: Hallux valgus deformity. Small plantar calcaneal spur. No acute bony abnormality.  Specifically, no fracture, subluxation, or dislocation. Soft tissues are intact. IMPRESSION: No acute bony abnormality.  Hallux valgus. Electronically Signed   By: Rolm Baptise M.D.   On: 08/13/2016 09:54    Cardiac Studies      Echo reviewed, moderate and stable pericardial effusion, no tamponade (08/13/16)   Patient Profile     [name] is a 82 year old male with a past medical history of HTN, nonhemorrhagic CVA, GI bleed, STEMI 03/2016, CHF, PAF on Eliquis who presented to the [facility] ED on 12/21/17with complaints of progressively worsening lower abdominal pain; s/p laparoscopic repair of strangulated left inguinal hernia with phasix mesh and short segment small bowel resection. Also has pericardial effusion s/p pericardiocentesis. Also with PAF (rapid a fib presently) requiring IV amiodarone, now transitioned to po Amio   Assessment & Plan    1. Pericardial effusion/Tamponade:Initially had tamponade features requiring pericardiocentesis. Drain removed 08/08/16.  Echo 08/13/16 showed stable moderate size pericardial effusion without tamponade.  2. Rapid atrial fibrillation: Would not restart apixaban for now given recent pericardial drain and effusion (risk of hemorrhagic transformation). Currently on amiodarone 400 mg tid and metoprolol 50 mg bid.  Will increase metoprolol to 75 mg bid. He may convert to NSR with progressive amiodarone loading.  3.CAD: s/p STEMI in Aug. 2017, medical therapy was initiated. No angina. Continue aspirin, atorvastatin, and metoprolol.   4. Hypertension: - BP controlled. Will increase metoprolol to 75 mg bid.  5.Acute on chronic diastolic congestive heart failure: Appears euvolemic.   6. Possible amyloidosis: Echo is concerning for amyloid as was previous MRI. However given age no good options for therapy.   Signed, Kate Sable, MD  08/15/2016, 9:42 AM

## 2016-08-16 LAB — CBC
HCT: 28.1 % — ABNORMAL LOW (ref 39.0–52.0)
HEMOGLOBIN: 9.5 g/dL — AB (ref 13.0–17.0)
MCH: 28.5 pg (ref 26.0–34.0)
MCHC: 33.8 g/dL (ref 30.0–36.0)
MCV: 84.4 fL (ref 78.0–100.0)
Platelets: 374 10*3/uL (ref 150–400)
RBC: 3.33 MIL/uL — AB (ref 4.22–5.81)
RDW: 15.3 % (ref 11.5–15.5)
WBC: 12.8 10*3/uL — ABNORMAL HIGH (ref 4.0–10.5)

## 2016-08-16 LAB — URINALYSIS, ROUTINE W REFLEX MICROSCOPIC
Bilirubin Urine: NEGATIVE
GLUCOSE, UA: NEGATIVE mg/dL
Hgb urine dipstick: NEGATIVE
Ketones, ur: NEGATIVE mg/dL
Leukocytes, UA: NEGATIVE
Nitrite: NEGATIVE
Protein, ur: NEGATIVE mg/dL
SPECIFIC GRAVITY, URINE: 1.019 (ref 1.005–1.030)
pH: 6 (ref 5.0–8.0)

## 2016-08-16 LAB — BASIC METABOLIC PANEL
Anion gap: 10 (ref 5–15)
BUN: 19 mg/dL (ref 6–20)
CHLORIDE: 104 mmol/L (ref 101–111)
CO2: 26 mmol/L (ref 22–32)
Calcium: 8.4 mg/dL — ABNORMAL LOW (ref 8.9–10.3)
Creatinine, Ser: 1.08 mg/dL (ref 0.61–1.24)
GFR calc Af Amer: >60 mL/min (ref >60)
GFR calc non Af Amer: >60 mL/min (ref >60)
GLUCOSE: 98 mg/dL (ref 65–99)
POTASSIUM: 3.7 mmol/L (ref 3.5–5.1)
Sodium: 140 mmol/L (ref 135–145)

## 2016-08-16 MED ORDER — ENOXAPARIN SODIUM 30 MG/0.3ML ~~LOC~~ SOLN
30.0000 mg | SUBCUTANEOUS | Status: DC
  Administered 2016-08-16 – 2016-08-17 (×2): 30 mg via SUBCUTANEOUS
  Filled 2016-08-16 (×2): qty 0.3

## 2016-08-16 NOTE — Progress Notes (Signed)
PROGRESS NOTE    Gregory [name]  [id number] DOB: 07/07/1930 DOA: 08/05/2016 PCP: [name], MD   Brief Narrative:  Patient is a 83 year old gentleman history of hypertension, nonhemorrhagic CVA, history of GI bleed, coronary artery disease status post STEMI August 2017, CHF, atrial fibrillation on anticoagulation who presented to the ED with complaints of progressively worsening low abdominal pain nausea vomiting. Patient noted to have an incarcerated left inguinal hernia and underwent emergent diagnostic laparoscopy with reduction of strangulated small bowel from left inguinal hernia,TAPP repair with phasix mesh, small bowel resection per Dr. Windle Guard 08/05/2016. Patient was assessed by cardiology preoperatively for clearance a repeat 2-D echo was done postoperatively to assess for pericardial effusion and patient noted to have a large per cardiology effusion with evidence of tamponade physiology including hypotension in the setting as SVT. Patient subsequently underwent pericardiocentesis per Dr. Burt Knack 08/06/2016. Patient also noted to be in A. fib with RVR and currently on amiodarone and beta blocker. Anticoagulation on hold secondary to concern for hemorrhagic compression would recent pericardiocentesis. Cardiology following. Patient also noted to have melanotic stools 08/13/2016 as well as 08/14/2016. H&H stable. General surgery following. [facility] were asked to take over patient's care 08/13/2016.   Assessment & Plan:   Principal Problem:   Incarcerated left inguinal hernia Active Problems:   Pericardial effusion   Melanotic stools   Postoperative anemia due to acute blood loss   Hypertension   Hyperlipidemia   PAF (paroxysmal atrial fibrillation) (HCC)   CAD (coronary artery disease)   Chronic diastolic congestive heart failure (HCC)   Incarcerated inguinal hernia   Preop cardiovascular exam   Malnutrition of moderate degree   Hypotension   Cardiac tamponade  Hypokalemia  #1 incarcerated left inguinal hernia Status post diagnostic laparoscopy, reduction of strangulated small bowel from left inguinal hernia,TAPP repair with phasix mesh, small bowel resection per Dr. Windle Guard 08/05/2016. Patient with some melanotic stools may be secondary from anastomosis site. H&H stable. Per general surgery.  #2 melanotic stools/postop acute blood loss anemia Patient noted to have melanotic stools yesterday which have improved to brown stools. H&H currently stable at 9.5. May be secondary to blood loss at the anastomotic site. Patient currently on PPI twice a day. Follow for now. Per general surgery.  #3 pericardial effusion/tamponade Patient initially had tamponade features requiring pericardiocentesis per Dr. Burt Knack 08/06/2016. Drain removed 08/08/2016. 2-D echo from 08/13/2016 with moderate-sized pleural effusion without tamponade. Per cardiology.  #4 rapid atrial fibrillation Patient in A. fib tachycardic heart rate in the 120s. Currently on amiodarone 400 mg 3 times daily and metoprolol 75 mg twice a day for rate control. Given recent pericardial drain and effusion cardiology holding off on resuming apixaban due to risk of hemorrhagic compression. Patient to be started on prophylactic dose Lovenox today is okay with general surgery and cardiology. Per cardiology.  #5 coronary artery disease status post STEMI August 2017 Currently asymptomatic. Continue current regimen of aspirin, Lipitor, Lopressor, amiodarone. Per cardiology.  #6 hypertension Stable. Continue metoprolol.  #7 chronic diastolic heart failure Compensated. Continue aspirin, Lipitor, Lopressor.  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 Per cardiology.  #9 gout X-ray negative for fracture. Continue colchicine.  #10 hyperlipidemia Continue statin.  #11 deconditioning PT/OT. Likely need skilled nursing facility.  #12 Hypokalemia Replete.    DVT prophylaxis:  Lovenox Code Status: Full Family Communication: Updated patient. No family at bedside. Disposition Plan: Likely skilled nursing facility when okay with cardiology and general surgery.   Consultants:  Cardiology: Dr. Stanford Breed 08/05/2016  [facility] Dr. Barbaraann Faster 08/05/2016  Admitted to general surgical service on admission due to incarcerated left inguinal hernia  Procedures:   CT abdomen and pelvis 08/05/2016  X-ray of the left foot 08/13/2016  Chest x-ray 08/05/2016, 08/08/2016  2-D echo 08/13/2016, 08/10/2016, 08/08/2016, 08/06/2016  Cardiac catheterization/pericardiocentesis 08/06/2016--- per Dr. Burt Knack  Bilateral lower extremity Dopplers 08/05/2016  Diagnostic laparoscopy, reduction of strangulated small bowel from left inguinal hernia,TAPP repair with phasix mesh, small bowel resection per Dr. Windle Guard 08/05/2016  Antimicrobials:   IV Ancef perioperatively 08/05/2016 x 1     Subjective: Patient Lane embedded. Patient still states some shortness of breath. No chest pain. No abdominal pain. Per Epic patient now with brown stools. Patient also complains of decreased appetite.   Objective: Vitals:   08/15/16 1735 08/15/16 2031 08/16/16 0548 08/16/16 0552  BP: 100/78 98/74  97/70  Pulse: (!) 108 (!) 118  (!) 103  Resp: (!) 25 (!) 26  (!) 25  Temp: 97.3 F (36.3 C) 97.6 F (36.4 C)  98.5 F (36.9 C)  TempSrc: Axillary Oral  Oral  SpO2: 96% 97%  98%  Weight:   69.3 kg (152 lb 12.8 oz)   Height:        Intake/Output Summary (Last 24 hours) at 08/16/16 0941 Last data filed at 08/16/16 0549  Gross per 24 hour  Intake              582 ml  Output              551 ml  Net               31 ml   Filed Weights   08/13/16 0453 08/15/16 0547 08/16/16 0548  Weight: 72.4 kg (159 lb 9.6 oz) 70.2 kg (154 lb 11.2 oz) 69.3 kg (152 lb 12.8 oz)    Examination:  General exam: Appears calm and comfortable  Respiratory system: Clear to auscultation. Respiratory  effort normal. Cardiovascular system: Tachycardic. Irregularly irregular. No JVD, murmurs, rubs, gallops or clicks. No pedal edema. Gastrointestinal system: Abdomen is nondistended, soft and nontender. No organomegaly or masses felt. Normal bowel sounds heard. Incision site clean dry intact. Staples intact. Central nervous system: Alert and oriented. No focal neurological deficits. Extremities: Symmetric 5 x 5 power. Skin: No rashes, lesions or ulcers Psychiatry: Judgement and insight appear normal. Mood & affect appropriate.     Data Reviewed: I have personally reviewed following labs and imaging studies  CBC:  Recent Labs Lab 08/11/16 0611 08/13/16 1654 08/13/16 2351 08/14/16 0535 08/15/16 0612 08/16/16 0243  WBC 8.8 14.4*  --  15.4* 13.9* 12.8*  HGB 9.8* 9.6* 9.4* 9.4* 9.6* 9.5*  HCT 29.4* 28.0* 27.9* 27.0* 29.2* 28.1*  MCV 84.0 83.6  --  82.6 86.4 84.4  PLT 200 277  --  300 340 [id number]   Basic Metabolic Panel:  Recent Labs Lab 08/11/16 0611 08/12/16 0313 08/14/16 0535 08/15/16 0612 08/16/16 0243  NA 141 137 137 138 140  K 3.5 3.5 4.5 3.4* 3.7  CL 105 99* 103 101 104  CO2 31 28 26 27 26   GLUCOSE 118* 114* 112* 91 98  BUN 16 15 17 17 19   CREATININE 0.78 0.80 0.88 0.98 1.08  CALCIUM 8.4* 8.5* 8.2* 8.3* 8.4*  MG  --   --  2.1 2.2  --    GFR: Estimated Creatinine Clearance: 49.9 mL/min (by C-G formula based on SCr of 1.08 mg/dL). Liver Function Tests: No  results for input(s): AST, ALT, ALKPHOS, BILITOT, PROT, ALBUMIN in the last 168 hours. No results for input(s): LIPASE, AMYLASE in the last 168 hours. No results for input(s): AMMONIA in the last 168 hours. Coagulation Profile: No results for input(s): INR, PROTIME in the last 168 hours. Cardiac Enzymes: No results for input(s): CKTOTAL, CKMB, CKMBINDEX, TROPONINI in the last 168 hours. BNP (last 3 results) No results for input(s): PROBNP in the last 8760 hours. HbA1C: No results for input(s): HGBA1C in the last  72 hours. CBG: No results for input(s): GLUCAP in the last 168 hours. Lipid Profile: No results for input(s): CHOL, HDL, LDLCALC, TRIG, CHOLHDL, LDLDIRECT in the last 72 hours. Thyroid Function Tests: No results for input(s): TSH, T4TOTAL, FREET4, T3FREE, THYROIDAB in the last 72 hours. Anemia Panel: No results for input(s): VITAMINB12, FOLATE, FERRITIN, TIBC, IRON, RETICCTPCT in the last 72 hours. Sepsis Labs: No results for input(s): PROCALCITON, LATICACIDVEN in the last 168 hours.  Recent Results (from the past 240 hour(s))  Body fluid culture     Status: None   Collection Time: 08/06/16  4:56 PM  Result Value Ref Range Status   Specimen Description PERICARDIAL  Final   Special Requests NONE  Final   Gram Stain   Final    RARE WBC PRESENT, PREDOMINANTLY MONONUCLEAR NO ORGANISMS SEEN    Culture NO GROWTH 3 DAYS  Final   Report Status 08/09/2016 FINAL  Final         Radiology Studies: No results found.      Scheduled Meds: . amiodarone  400 mg Oral Q8H  . aspirin EC  81 mg Oral Daily  . atorvastatin  40 mg Oral Daily  . colchicine  0.6 mg Oral Daily  . feeding supplement (ENSURE ENLIVE)  237 mL Oral TID BM  . metoprolol  75 mg Oral BID  . mirtazapine  7.5 mg Oral QHS  . multivitamin with minerals  1 tablet Oral Daily  . pantoprazole  40 mg Oral BID   Continuous Infusions:   LOS: 11 days    Time spent: 93 minutes    Metzli Pollick, MD [facility] Pager [phone]  If 7PM-7AM, please contact night-coverage [email or web address] Password [email or web address] 08/16/2016, 9:41 AM

## 2016-08-16 NOTE — Progress Notes (Signed)
10 Days Post-Op  Subjective: No complaints No further bloody BM's  Objective: Vital signs in last 24 hours: Temp:  [97.3 F (36.3 C)-98.5 F (36.9 C)] 98.5 F (36.9 C) (01/01 0552) Pulse Rate:  [103-118] 103 (01/01 0552) Resp:  [25-26] 25 (01/01 0552) BP: (97-100)/(70-78) 97/70 (01/01 0552) SpO2:  [96 %-98 %] 98 % (01/01 0552) Weight:  [69.3 kg (152 lb 12.8 oz)] 69.3 kg (152 lb 12.8 oz) (01/01 0548) Last BM Date: 08/15/16  Intake/Output from previous day: 12/31 0701 - 01/01 0700 In: 804 [P.O.:804] Out: 551 [Urine:550; Stool:1] Intake/Output this shift: No intake/output data recorded.  Exam: Abdomen soft, incision clean  Lab Results:   Recent Labs  08/15/16 0612 08/16/16 0243  WBC 13.9* 12.8*  HGB 9.6* 9.5*  HCT 29.2* 28.1*  PLT 340 374   BMET  Recent Labs  08/15/16 0612 08/16/16 0243  NA 138 140  K 3.4* 3.7  CL 101 104  CO2 27 26  GLUCOSE 91 98  BUN 17 19  CREATININE 0.98 1.08  CALCIUM 8.3* 8.4*   PT/INR No results for input(s): LABPROT, INR in the last 72 hours. ABG No results for input(s): PHART, HCO3 in the last 72 hours.  Invalid input(s): PCO2, PO2  Studies/Results: No results found.  Anti-infectives: Anti-infectives    Start     Dose/Rate Route Frequency Ordered Stop   08/06/16 0600  ceFAZolin (ANCEF) IVPB 2g/100 mL premix  Status:  Discontinued     2 g 200 mL/hr over 30 Minutes Intravenous On call to O.R. 08/05/16 1219 08/05/16 1815   08/06/16 0600  metroNIDAZOLE (FLAGYL) IVPB 500 mg     500 mg 100 mL/hr over 60 Minutes Intravenous On call to O.R. 08/05/16 1417 08/05/16 1456   08/05/16 1445  ceFAZolin (ANCEF) IVPB 2g/100 mL premix     2 g 200 mL/hr over 30 Minutes Intravenous On call to O.R. 08/05/16 1417 08/05/16 1444      Assessment/Plan: s/p Procedure(s): Pericardiocentesis (N/A)  S/p repair incarcerated hernia with small bowel resection.  Ok to resume lovenox from surgery standpoint  LOS: 11 days    Gregory [name]  A 08/16/2016

## 2016-08-16 NOTE — Progress Notes (Addendum)
Physical Therapy Treatment Patient Details Name: Gregory [name] MRN: [id number] DOB: [birth year] Today's Date: 08/16/2016    History of Present Illness PT is a 83 year old male with a history of HTN, nonhemorrhagic CVA, GI bleed, STEMI 03/2016, CHF, A. Fib on Eliquis with abdominal pain.  Pt s/p  Diagnostic laparoscopy, reduction of strangulated small bowel from left inguinal hernia, TAPP repair with phasix mesh, small bowel resection.   Also complication of tamponade symptoms, s/p pericardiocentesis.    PT Comments    Pt very pleasant and willing to mobilize. HR 105-120 at rest with rate 125-138 with activity, no SOB, CP or fatigue with activity. Pt educated for transfers, gait and HEP and encouraged to continue mobility with nursing assist.    Follow Up Recommendations  SNF     Equipment Recommendations  Rolling walker with 5" wheels    Recommendations for Other Services       Precautions / Restrictions Precautions Precautions: Fall Precaution Comments: Abdominal incision and HR <150    Mobility  Bed Mobility Overal bed mobility: Needs Assistance Bed Mobility: Supine to Sit     Supine to sit: Mod assist     General bed mobility comments: assist to bring legs off of bed and elevate trunk with cues for sequence. HOB 15 degrees  Transfers Overall transfer level: Needs assistance   Transfers: Sit to/from Stand Sit to Stand: Min assist         General transfer comment: cues for sequence, hand placement and anterior translation with bed elevated grossly 3 inches. Pt not reaching back for chair to sit despite cues  Ambulation/Gait Ambulation/Gait assistance: Min assist Ambulation Distance (Feet): 90 Feet Assistive device: Rolling walker (2 wheeled) Gait Pattern/deviations: Step-through pattern;Decreased stride length;Trunk flexed   Gait velocity interpretation: Below normal speed for age/gender General Gait Details: cues for posture and position in RW   Stairs             Wheelchair Mobility    Modified Rankin (Stroke Patients Only)       Balance Overall balance assessment: Needs assistance   Sitting balance-Leahy Scale: Good       Standing balance-Leahy Scale: Fair                      Cognition Arousal/Alertness: Awake/alert Behavior During Therapy: WFL for tasks assessed/performed Overall Cognitive Status: Within Functional Limits for tasks assessed                      Exercises General Exercises - Lower Extremity Long Arc Quad: AROM;Both;Seated;15 reps Hip ABduction/ADduction: AROM;Both;Seated;15 reps Hip Flexion/Marching: AROM;Both;Seated;15 reps Toe Raises: AROM;Both;Seated;15 reps    General Comments        Pertinent Vitals/Pain Pain Assessment: No/denies pain    Home Living                      Prior Function            PT Goals (current goals can now be found in the care plan section) Acute Rehab PT Goals Time For Goal Achievement: 08/30/16 Potential to Achieve Goals: Good Progress towards PT goals: Goals met and updated - see care plan    Frequency    Min 3X/week      PT Plan Current plan remains appropriate;Frequency needs to be updated    Co-evaluation             End of Session Equipment Utilized During  Treatment: Gait belt Activity Tolerance: Patient tolerated treatment well Patient left: in chair;with call bell/phone within reach;with chair alarm set     Time: 0920-0947 PT Time Calculation (min) (ACUTE ONLY): 27 min  Charges:  $Gait Training: 8-22 mins $Therapeutic Exercise: 8-22 mins                    G Codes:      Alinna Siple B Braylen Denunzio 2016/09/03, 10:15 AM Elwyn Reach, [place]

## 2016-08-16 NOTE — Progress Notes (Signed)
Patient Name: Gregory [name] Date of Encounter: 08/16/2016  Primary Cardiologist: [facility] Problem List     Principal Problem:   Incarcerated left inguinal hernia Active Problems:   Hypertension   Hyperlipidemia   PAF (paroxysmal atrial fibrillation) (HCC)   CAD (coronary artery disease)   Chronic diastolic congestive heart failure (HCC)   Incarcerated inguinal hernia   Preop cardiovascular exam   Malnutrition of moderate degree   Hypotension   Cardiac tamponade   Pericardial effusion   Hypokalemia   Melanotic stools   Postoperative anemia due to acute blood loss     Subjective   Tried to eat a little this morning.   Inpatient Medications    Scheduled Meds: . amiodarone  400 mg Oral Q8H  . aspirin EC  81 mg Oral Daily  . atorvastatin  40 mg Oral Daily  . colchicine  0.6 mg Oral Daily  . feeding supplement (ENSURE ENLIVE)  237 mL Oral TID BM  . metoprolol  75 mg Oral BID  . mirtazapine  7.5 mg Oral QHS  . multivitamin with minerals  1 tablet Oral Daily  . pantoprazole  40 mg Oral BID   Continuous Infusions:  PRN Meds: acetaminophen **OR** acetaminophen, diphenhydrAMINE **OR** [DISCONTINUED] diphenhydrAMINE, fentaNYL (SUBLIMAZE) injection, hydrALAZINE, HYDROmorphone (DILAUDID) injection, metoprolol, ondansetron **OR** ondansetron (ZOFRAN) IV, oxyCODONE   Vital Signs    Vitals:   08/15/16 1735 08/15/16 2031 08/16/16 0548 08/16/16 0552  BP: 100/78 98/74  97/70  Pulse: (!) 108 (!) 118  (!) 103  Resp: (!) 25 (!) 26  (!) 25  Temp: 97.3 F (36.3 C) 97.6 F (36.4 C)  98.5 F (36.9 C)  TempSrc: Axillary Oral  Oral  SpO2: 96% 97%  98%  Weight:   152 lb 12.8 oz (69.3 kg)   Height:        Intake/Output Summary (Last 24 hours) at 08/16/16 0912 Last data filed at 08/16/16 0549  Gross per 24 hour  Intake              582 ml  Output              551 ml  Net               31 ml   Filed Weights   08/13/16 0453 08/15/16 0547 08/16/16 0548  Weight: 159  lb 9.6 oz (72.4 kg) 154 lb 11.2 oz (70.2 kg) 152 lb 12.8 oz (69.3 kg)    Physical Exam   GEN:No acute distress.  HEENT:Grossly normal.  Neck:Supple, no JVD. Cardiac:Tachycardic, irregular, no murmurs, rubs, or gallops. No clubbing, cyanosis, edema.  Respiratory:Respirations regular and unlabored, clear to auscultation bilaterally. [id number] MS:no deformity or atrophy. Skin:warm and dry, no rash. Neuro:Non focal Psych:AAO. Normal affect.  Labs    CBC  Recent Labs  08/15/16 0612 08/16/16 0243  WBC 13.9* 12.8*  HGB 9.6* 9.5*  HCT 29.2* 28.1*  MCV 86.4 84.4  PLT 340 [id number]   Basic Metabolic Panel  Recent Labs  08/14/16 0535 08/15/16 0612 08/16/16 0243  NA 137 138 140  K 4.5 3.4* 3.7  CL 103 101 104  CO2 26 27 26   GLUCOSE 112* 91 98  BUN 17 17 19   CREATININE 0.88 0.98 1.08  CALCIUM 8.2* 8.3* 8.4*  MG 2.1 2.2  --    Liver Function Tests No results for input(s): AST, ALT, ALKPHOS, BILITOT, PROT, ALBUMIN in the last 72 hours. No results for input(s): LIPASE, AMYLASE in the  last 72 hours. Cardiac Enzymes No results for input(s): CKTOTAL, CKMB, CKMBINDEX, TROPONINI in the last 72 hours. BNP Invalid input(s): POCBNP D-Dimer No results for input(s): DDIMER in the last 72 hours. Hemoglobin A1C No results for input(s): HGBA1C in the last 72 hours. Fasting Lipid Panel No results for input(s): CHOL, HDL, LDLCALC, TRIG, CHOLHDL, LDLDIRECT in the last 72 hours. Thyroid Function Tests No results for input(s): TSH, T4TOTAL, T3FREE, THYROIDAB in the last 72 hours.  Invalid input(s): FREET3  Telemetry    A fib (HR loww 100's) with isolated PVC's - Personally Reviewed  ECG    - Personally Reviewed  Radiology    No results found.  Cardiac Studies   Echo reviewed, moderate and stable pericardial effusion, no tamponade (08/13/16)  Patient Profile     Gregory [name] is a 83 year old male with a past medical history of HTN, nonhemorrhagic CVA, GI bleed,  STEMI 03/2016, CHF, PAF on Eliquis who presented to the [facility] ED on 12/21/17with complaints of progressively worsening lower abdominal pain; s/p laparoscopic repair of strangulated left inguinal hernia with phasix mesh and short segment small bowel resection. Also has pericardial effusion s/p pericardiocentesis. Also with PAF (rapid a fib presently) requiring IV amiodarone, now transitioned to po amiodarone.  Assessment & Plan    1. Pericardial effusion/Tamponade:Initially had tamponade features requiring pericardiocentesis. Drain removed 08/08/16.  Echo 08/13/16 showed stable moderate sizepericardial effusion without tamponade.  2. Rapid atrial fibrillation: Would not restart apixaban for now given recent pericardial drain and effusion (risk of hemorrhagic transformation). Lovenox at prophylactic dose being started by IM. Currently on amiodarone 400 mg tid and metoprolol 75 mg bid (increased 08/15/16).  He may convert to NSR with progressive amiodarone loading. If not, he may require TEE/DCCV this week.  3.CAD: s/p STEMI in Aug. 2017, medical therapy was initiated. No angina. Continue aspirin, atorvastatin, and metoprolol.   4. Hypertension: - BP controlled. No changes.  5.Acute on chronic diastolic congestive heart failure: Appears euvolemic.   6. Possible amyloidosis: Echo is concerning for amyloid as was previous MRI. However given age no good options for therapy.   Signed, Kate Sable, MD  08/16/2016, 9:12 AM

## 2016-08-17 LAB — CBC WITH DIFFERENTIAL/PLATELET
BASOS PCT: 0 %
Basophils Absolute: 0 10*3/uL (ref 0.0–0.1)
Eosinophils Absolute: 0.1 10*3/uL (ref 0.0–0.7)
Eosinophils Relative: 1 %
HEMATOCRIT: 34.7 % — AB (ref 39.0–52.0)
Hemoglobin: 11.1 g/dL — ABNORMAL LOW (ref 13.0–17.0)
LYMPHS PCT: 16 %
Lymphs Abs: 1.7 10*3/uL (ref 0.7–4.0)
MCH: 28.2 pg (ref 26.0–34.0)
MCHC: 32 g/dL (ref 30.0–36.0)
MCV: 88.1 fL (ref 78.0–100.0)
MONO ABS: 0.5 10*3/uL (ref 0.1–1.0)
Monocytes Relative: 4 %
NEUTROS ABS: 8.2 10*3/uL — AB (ref 1.7–7.7)
Neutrophils Relative %: 79 %
PLATELETS: 374 10*3/uL (ref 150–400)
RBC: 3.94 MIL/uL — AB (ref 4.22–5.81)
RDW: 15.6 % — AB (ref 11.5–15.5)
WBC: 10.4 10*3/uL (ref 4.0–10.5)

## 2016-08-17 LAB — BASIC METABOLIC PANEL
ANION GAP: 10 (ref 5–15)
BUN: 16 mg/dL (ref 6–20)
CALCIUM: 8.3 mg/dL — AB (ref 8.9–10.3)
CO2: 24 mmol/L (ref 22–32)
Chloride: 106 mmol/L (ref 101–111)
Creatinine, Ser: 1.08 mg/dL (ref 0.61–1.24)
GLUCOSE: 71 mg/dL (ref 65–99)
POTASSIUM: 4 mmol/L (ref 3.5–5.1)
Sodium: 140 mmol/L (ref 135–145)

## 2016-08-17 LAB — URINE CULTURE

## 2016-08-17 MED ORDER — ENSURE ENLIVE PO LIQD
237.0000 mL | Freq: Two times a day (BID) | ORAL | Status: DC
  Administered 2016-08-19 – 2016-08-22 (×3): 237 mL via ORAL

## 2016-08-17 MED ORDER — METOPROLOL TARTRATE 100 MG PO TABS
100.0000 mg | ORAL_TABLET | Freq: Two times a day (BID) | ORAL | Status: DC
  Administered 2016-08-17 – 2016-08-22 (×8): 100 mg via ORAL
  Filled 2016-08-17 (×9): qty 1

## 2016-08-17 MED ORDER — APIXABAN 5 MG PO TABS
5.0000 mg | ORAL_TABLET | Freq: Two times a day (BID) | ORAL | Status: DC
  Administered 2016-08-17 – 2016-08-22 (×11): 5 mg via ORAL
  Filled 2016-08-17 (×11): qty 1

## 2016-08-17 NOTE — Progress Notes (Signed)
Occupational Therapy Treatment Patient Details Name: Gregory [name] MRN: [id number] DOB: 01-01-1930 Today's Date: 08/17/2016    History of present illness Pt is a 83 year old male with a history of HTN, nonhemorrhagic CVA, GI bleed, STEMI 03/2016, CHF, A. Fib on Eliquis with abdominal pain.  Pt s/p  Diagnostic laparoscopy, reduction of strangulated small bowel from left inguinal hernia, TAPP repair with phasix mesh, small bowel resection.   Also complication of tamponade symptoms, s/p pericardiocentesis.   OT comments  Focus of session on basic mobility, grooming and LB bathing and dressing. Pt tolerating activity well with HR to 138 with ambulation. Pt limited by B shoulder limitations and pain. Requires B UE support in standing. Will continue to follow.  Follow Up Recommendations  SNF;Supervision/Assistance - 24 hour    Equipment Recommendations       Recommendations for Other Services      Precautions / Restrictions Precautions Precautions: Fall Precaution Comments: Abdominal incision and HR <150 Restrictions Weight Bearing Restrictions: No       Mobility Bed Mobility Overal bed mobility: Needs Assistance Bed Mobility: Supine to Sit     Supine to sit: Supervision     General bed mobility comments: increased time, verbal cues  Transfers Overall transfer level: Needs assistance Equipment used: Rolling walker (2 wheeled) Transfers: Sit to/from Stand Sit to Stand: Min assist         General transfer comment: Pt wanting therapist to stabilize walker while he pulled up, educated pt in why this was an unsafe method.    Balance     Sitting balance-Leahy Scale: Good       Standing balance-Leahy Scale: Poor                     ADL Overall ADL's : Needs assistance/impaired     Grooming: Wash/dry hands;Wash/dry face;Oral care;Sitting;Minimal assistance       Lower Body Bathing: Sitting/lateral leans;Moderate assistance Lower Body Bathing Details  (indicate cue type and reason): washed feet, pt washed lower legs, applied lotion Upper Body Dressing : Minimal assistance;Sitting   Lower Body Dressing: Moderate assistance;Sitting/lateral leans Lower Body Dressing Details (indicate cue type and reason): doffed socks, mod assist to donn, hurts shoulders, but able to get to feet Toilet Transfer: Minimal assistance;Ambulation;BSC;RW   Toileting- Clothing Manipulation and Hygiene: Minimal assistance;Sit to/from stand       Functional mobility during ADLs: Minimal assistance;Rolling walker General ADL Comments: HR 125-138 with ambulation      Vision                     Perception     Praxis      Cognition   Behavior During Therapy: WFL for tasks assessed/performed Overall Cognitive Status: Within Functional Limits for tasks assessed                       Extremity/Trunk Assessment               Exercises     Shoulder Instructions       General Comments      Pertinent Vitals/ Pain       Pain Assessment: No/denies pain  Home Living                                          Prior Functioning/Environment  Frequency  Min 2X/week        Progress Toward Goals  OT Goals(current goals can now be found in the care plan section)  Progress towards OT goals: Progressing toward goals  Acute Rehab OT Goals Patient Stated Goal: get back home Time For Goal Achievement: 08/27/16 Potential to Achieve Goals: Good  Plan Discharge plan remains appropriate    Co-evaluation                 End of Session Equipment Utilized During Treatment: Gait belt;Rolling walker   Activity Tolerance Patient tolerated treatment well   Patient Left in chair;with call bell/phone within reach;with chair alarm set   Nurse Communication Mobility status        Time: [id number] OT Time Calculation (min): 37 min  Charges: OT General Charges $OT Visit: 1 Procedure OT  Treatments $Self Care/Home Management : 23-37 mins  Malka So 08/17/2016, 10:36 AM  [phone]

## 2016-08-17 NOTE — Progress Notes (Addendum)
Patient Name: Gregory [name] Date of Encounter: 08/17/2016  Primary Cardiologist: Dr. Clearnce [name] Problem List     Principal Problem:   Incarcerated left inguinal hernia Active Problems:   Hypertension   Hyperlipidemia   PAF (paroxysmal atrial fibrillation) (HCC)   CAD (coronary artery disease)   Chronic diastolic congestive heart failure (HCC)   Incarcerated inguinal hernia   Preop cardiovascular exam   Malnutrition of moderate degree   Hypotension   Cardiac tamponade   Pericardial effusion   Hypokalemia   Melanotic stools   Postoperative anemia due to acute blood loss     Subjective   Feels well, denies chest pain and palpitations.   Inpatient Medications    Scheduled Meds: . amiodarone  400 mg Oral Q8H  . aspirin EC  81 mg Oral Daily  . atorvastatin  40 mg Oral Daily  . colchicine  0.6 mg Oral Daily  . enoxaparin (LOVENOX) injection  30 mg Subcutaneous Q24H  . feeding supplement (ENSURE ENLIVE)  237 mL Oral TID BM  . metoprolol  75 mg Oral BID  . mirtazapine  7.5 mg Oral QHS  . multivitamin with minerals  1 tablet Oral Daily  . pantoprazole  40 mg Oral BID   Continuous Infusions:  PRN Meds: acetaminophen **OR** acetaminophen, diphenhydrAMINE **OR** [DISCONTINUED] diphenhydrAMINE, fentaNYL (SUBLIMAZE) injection, hydrALAZINE, HYDROmorphone (DILAUDID) injection, metoprolol, ondansetron **OR** ondansetron (ZOFRAN) IV, oxyCODONE   Vital Signs    Vitals:   08/16/16 1401 08/16/16 1800 08/16/16 2031 08/17/16 0302  BP: 93/71 98/70 106/71 103/77  Pulse: (!) 102  89   Resp: 17     Temp: 98.8 F (37.1 C) 98.5 F (36.9 C)  97.7 F (36.5 C)  TempSrc: Oral Oral  Oral  SpO2: 99% 98%  98%  Weight:    153 lb 14.4 oz (69.8 kg)  Height:        Intake/Output Summary (Last 24 hours) at 08/17/16 1132 Last data filed at 08/17/16 0835  Gross per 24 hour  Intake              780 ml  Output              300 ml  Net              480 ml   Filed Weights   08/15/16 0547 08/16/16 0548 08/17/16 0302  Weight: 154 lb 11.2 oz (70.2 kg) 152 lb 12.8 oz (69.3 kg) 153 lb 14.4 oz (69.8 kg)    Physical Exam   GEN: Well nourished, well developed, in no acute distress.  HEENT: Grossly normal.  Neck: Supple, no JVD, carotid bruits, or masses. Cardiac: RRR, no murmurs, rubs, or gallops. No clubbing, cyanosis, edema.  Radials/DP/PT 2+ and equal bilaterally.  Respiratory:  Respirations regular and unlabored, clear to auscultation bilaterally. GI: Soft, nontender, nondistended, BS + x 4. MS: no deformity or atrophy. Skin: warm and dry, no rash. Neuro:  Strength and sensation are intact. Psych: AAOx3.  Normal affect.  Labs    CBC  Recent Labs  08/16/16 0243 08/17/16 0708  WBC 12.8* 10.4  NEUTROABS  --  8.2*  HGB 9.5* 11.1*  HCT 28.1* 34.7*  MCV 84.4 88.1  PLT 374 [id number]   Basic Metabolic Panel  Recent Labs  08/15/16 0612 08/16/16 0243 08/17/16 0708  NA 138 140 140  K 3.4* 3.7 4.0  CL 101 104 106  CO2 27 26 24   GLUCOSE 91 98 71  BUN 17  19 16  CREATININE 0.98 1.08 1.08  CALCIUM 8.3* 8.4* 8.3*  MG 2.2  --   --      Telemetry    Afib - Personally Reviewed    Radiology    No results found.  Cardiac Studies  Transthoracic Echocardiography 08/13/16 Study Conclusions  - Left ventricle: The cavity size was normal. There was severe   concentric hypertrophy. LV mid-cavity obliteration with LV   mid-cavity gradient to 22 mmHg peak. Systolic function was   normal. The estimated ejection fraction was in the range of 55%   to 60%. Indeterminant diastolic function (not assessed). Wall   motion was normal; there were no regional wall motion   abnormalities. - Aortic valve: There was no stenosis. - Mitral valve: Mildly calcified annulus. There was trivial   regurgitation. - Left atrium: The atrium was mildly to moderately dilated. - Right ventricle: The cavity size was normal. Wall thickness was   mildly to moderately increased.  Systolic function was mildly to   moderately reduced. - Right atrium: The atrium was mildly dilated. - Pulmonary arteries: No complete TR doppler jet so unable to   estimate PA systolic pressure. - Systemic veins: IVC not visualized. - Pericardium, extracardiac: Moderate circumferential pericardial   effusion, primarily located posteriorly. 2.0 cm thickness,   similar to prior. There does not appear to be tamponade present.  Impressions:  - Normal LV size with severe concentric LV hypertrophy, EF 55-60%.   Normal RV size with mild to moderate RV hypertrophy and mild to   moderate RV systolic dysfunction. Moderate pericardial effusion   without tamponade, primarily posterior, similar to prior.   Findings suggestive of cardiac amyloidosis.   Patient Profile     [name] is a 83 year old male with a past medical history of HTN, nonhemorrhagic CVA, GI bleed, STEMI 03/2016, CHF, PAF on Eliquis who presented to the [facility] ED on 12/21/17with complaints of progressively worsening lower abdominal pain; s/p laparoscopic repair of strangulated left inguinal hernia with phasix mesh and short segment small bowel resection.   Found to have a pericardial effusion with tamponade features post abdominal surgery, s/p pericardicentesis. Pericardial drain pulled on 08/08/16. Repeat Echo shows no tamponade. 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   Also developed Afib RVR post surgery, started on Amio and beta blocker.     Assessment & Plan    1. Pericardial Effusion: with residual posterior effusion,  2. Atrial fibrillation with RVR: Continues to have elevated HR's in the 100-120's. He is currently on Amiodarone 400mg  po TID and metoprolol 75mg  BID.   Will need TEE/DCCV. MD to advise on timing. Also need MD to comment on reinitiating NOAC therapy. Eliquis was initially held due to risk of hemorraghic compression in the setting of cardiac tamponade.    No room to uptitrate metoprolol.    3.CAD: s/p STEMI in Aug. 2017, medical therapy was initiated. No angina. Continue aspirin, atorvastatin, and metoprolol.  4. Possible amyloidosis: Echo is concerning for amyloid as was previous MRI. However given age no good options for therapy.   5.Acute on chronic diastolic congestive heart failure: Stable,  Appears euvolemic   Signed, Arbutus Leas, NP  08/17/2016, 11:32 AM   Patient seen and examined. Agree with assessment and plan. HR remains in the 100 - 115 range; pt is getting loaded with amiodarone at 400 mg tid.  Will increase metoprolol to 100 mg bid as long as BP > 100. Repeat echo show moderate residual posterior  effusion without tamponade; stable on last 2 echoes.  Cr 1.08.  No pulsus paradox. Will resume eliquis at 5 mg bid, and even with CAD will dc ASA to reduce potential bleed risk with effusion; dc sq lovenox. F/u cbc, bmet, tsh, and lfts in am   Troy Sine, MD, [facility] 08/17/2016 12:24 PM

## 2016-08-17 NOTE — Care Management Important Message (Signed)
Important Message  Patient Details  Name: Gregory [name] MRN: [id number] Date of Birth: 12-May-1930   Medicare Important Message Given:  Yes    Nathen May 08/17/2016, 2:43 PM

## 2016-08-17 NOTE — Clinical Social Work Note (Signed)
CSW met with patient at bedside. The patient has still not made a decision on which facility he plans to go to despite having bed offers for multiple days. CSW discussed case with charge RN who shares that the patient is confused and a poor historian. CSW has left multiple messages with listed family contacts requesting call back so that discussion can be had around where the patient is going to go at discharge.  Liz Beach MSW, [place], [place], [phone]

## 2016-08-17 NOTE — Progress Notes (Signed)
PROGRESS NOTE    Gregory [name]  [id number] DOB: Jan 24, 1930 DOA: 08/05/2016 PCP: [name], MD    Brief Narrative:  Patient is a 83 year old gentleman history of hypertension, nonhemorrhagic CVA, history of GI bleed, coronary artery disease status post STEMI August 2017, CHF, atrial fibrillation on anticoagulation who presented to the ED with complaints of progressively worsening low abdominal pain nausea vomiting. Patient noted to have an incarcerated left inguinal hernia and underwent emergent diagnostic laparoscopy with reduction of strangulated small bowel from left inguinal hernia,TAPP repair with phasix mesh, small bowel resection per Dr. Windle Guard 08/05/2016. Patient was assessed by cardiology preoperatively for clearance a repeat 2-D echo was done postoperatively to assess for pericardial effusion and patient noted to have a large per cardiology effusion with evidence of tamponade physiology including hypotension in the setting as SVT. Patient subsequently underwent pericardiocentesis per Dr. Burt Knack 08/06/2016. Patient also noted to be in A. fib with RVR and currently on amiodarone and beta blocker. Anticoagulation on hold secondary to concern for hemorrhagic compression would recent pericardiocentesis. Cardiology following. Patient also noted to have melanotic stools 08/13/2016 as well as 08/14/2016. H&H stable. General surgery following. [facility] were asked to take over patient's care 08/13/2016.   Assessment & Plan:   Principal Problem:   Incarcerated left inguinal hernia Active Problems:   Hypertension   Hyperlipidemia   PAF (paroxysmal atrial fibrillation) (HCC)   CAD (coronary artery disease)   Chronic diastolic congestive heart failure (HCC)   Incarcerated inguinal hernia   Preop cardiovascular exam   Malnutrition of moderate degree   Hypotension   Cardiac tamponade   Pericardial effusion   Hypokalemia   Melanotic stools   Postoperative anemia due to  acute blood loss   Incarcerated left inguinal hernia Status post diagnostic laparoscopy, reduction of strangulated small bowel from left inguinal hernia,TAPP repair with phasix mesh, small bowel resection per Dr. Windle Guard 08/05/2016 Previous melanotic stools may be secondary from anastomosis site H/H improved today Gen Surg following  Melanotic stools/postop acute blood loss anemia Patient noted to have melanotic stools two days ago H&H improved today to 11.1/34.7 Continue PPI twice a day Follow for now  Pericardial effusion/tamponade Patient initially had tamponade features requiring pericardiocentesis per Dr. Burt Knack 08/06/2016 Drain removed 08/08/2016 2-D echo from 08/13/2016 with moderate-sized pleural effusion without tamponade  Rapid atrial fibrillation Patient in A. fib tachycardic heart rate in the 120s Amiodarone 400 mg 3 times daily and metoprolol 75 mg twice a day for rate control With recent pericardial drain and effusion cardiology holding off on resuming apixaban due to risk of hemorrhagic compression. Patient to be started on prophylactic dose Lovenox yesterday Per last cardiology note patient may need TEE/ DCCV later this week  Coronary artery disease status post STEMI August 2017 Currently asymptomatic Continue current regimen of aspirin, Lipitor, Lopressor, amiodarone  Hypertension Stable Continue metoprolol.  Chronic diastolic heart failure Compensated Continue aspirin, Lipitor, Lopressor.  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  Hyperlipidemia Continue statin.  Deconditioning PT/OT consulted Patient will need skilled nursing facility Will write for ambulate with assistance BID as patient states he would like to get up more Social work consulted to help with placement  Hypokalemia Repleted K WNL this  am    DVT prophylaxis: Lovenox Code Status: Full Family Communication: Updated patient. No family at bedside. Disposition Plan: Will need SNF at time of discharge,   Consultants:  Cardiology  [facility]  Admitted to [facility] (General Surgery) for incarcerated left inguinal hernia  Procedures:   CT abdomen and pelvis 08/05/2016  X-ray of the left foot 08/13/2016  Chest x-ray 08/05/2016, 08/08/2016  2-D echo 08/13/2016, 08/10/2016, 08/08/2016, 08/06/2016  Cardiac catheterization/pericardiocentesis 08/06/2016--- per Dr. Burt Knack  Bilateral lower extremity Dopplers 08/05/2016  Diagnostic laparoscopy, reduction of strangulated small bowel from left inguinal hernia,TAPP repair with phasix mesh, small bowel resection per Dr. Windle Guard 08/05/2016  Antimicrobials:   1 dose of Ancef preoperatively on 08/05/16    Subjective: Patient voices his appetite is not improved.  Asking if he can get up more than once a day- says he feels like all he is doing is laying in bed.  Says he would like to get to walk more but his feet hurt.  Has lots of questions about what is going on with his heart.  Knows he is likely not discharging today.  Objective: Vitals:   08/16/16 1401 08/16/16 1800 08/16/16 2031 08/17/16 0302  BP: 93/71 98/70 106/71 103/77  Pulse: (!) 102  89   Resp: 17     Temp: 98.8 F (37.1 C) 98.5 F (36.9 C)  97.7 F (36.5 C)  TempSrc: Oral Oral  Oral  SpO2: 99% 98%  98%  Weight:    69.8 kg (153 lb 14.4 oz)  Height:        Intake/Output Summary (Last 24 hours) at 08/17/16 0847 Last data filed at 08/17/16 0835  Gross per 24 hour  Intake              900 ml  Output              700 ml  Net              200 ml   Filed Weights   08/15/16 0547 08/16/16 0548 08/17/16 0302  Weight: 70.2 kg (154 lb 11.2 oz) 69.3 kg (152 lb 12.8 oz) 69.8 kg (153 lb 14.4 oz)    Examination:  General exam: Appears calm and comfortable  Respiratory system: Clear to auscultation. Respiratory  effort normal. Cardiovascular system: S1 & S2 heard, irregular rhythmn,  No JVD, murmurs, rubs, gallops or clicks. No pedal edema. Gastrointestinal system: Abdomen is nondistended, soft and nontender. Post op stables in place but are nonerythematous, non tender with no appreciable discharge; No organomegaly or masses felt. Normal bowel sounds heard. Central nervous system: Alert and oriented. No focal neurological deficits. Extremities: able to move arms and legs bilaterally. Skin: No rashes, lesions or ulcers; dry skin Psychiatry: Judgement appears normal. Mood & affect appropriate.     Data Reviewed: I have personally reviewed following labs and imaging studies  CBC:  Recent Labs Lab 08/13/16 1654 08/13/16 2351 08/14/16 0535 08/15/16 0612 08/16/16 0243 08/17/16 0708  WBC 14.4*  --  15.4* 13.9* 12.8* 10.4  NEUTROABS  --   --   --   --   --  8.2*  HGB 9.6* 9.4* 9.4* 9.6* 9.5* 11.1*  HCT 28.0* 27.9* 27.0* 29.2* 28.1* 34.7*  MCV 83.6  --  82.6 86.4 84.4 88.1  PLT 277  --  300 340 374 [id number]   Basic Metabolic Panel:  Recent Labs Lab 08/11/16 0611 08/12/16 0313 08/14/16 0535 08/15/16 0612 08/16/16 0243  NA 141 137 137 138 140  K 3.5 3.5 4.5 3.4* 3.7  CL 105 99* 103 101 104  CO2 31 28 26 27 26   GLUCOSE 118* 114* 112* 91 98  BUN 16 15  17 17 19   CREATININE 0.78 0.80 0.88 0.98 1.08  CALCIUM 8.4* 8.5* 8.2* 8.3* 8.4*  MG  --   --  2.1 2.2  --    GFR: Estimated Creatinine Clearance: 50.3 mL/min (by C-G formula based on SCr of 1.08 mg/dL). Liver Function Tests: No results for input(s): AST, ALT, ALKPHOS, BILITOT, PROT, ALBUMIN in the last 168 hours. No results for input(s): LIPASE, AMYLASE in the last 168 hours. No results for input(s): AMMONIA in the last 168 hours. Coagulation Profile: No results for input(s): INR, PROTIME in the last 168 hours. Cardiac Enzymes: No results for input(s): CKTOTAL, CKMB, CKMBINDEX, TROPONINI in the last 168 hours. BNP (last 3 results) No  results for input(s): PROBNP in the last 8760 hours. HbA1C: No results for input(s): HGBA1C in the last 72 hours. CBG: No results for input(s): GLUCAP in the last 168 hours. Lipid Profile: No results for input(s): CHOL, HDL, LDLCALC, TRIG, CHOLHDL, LDLDIRECT in the last 72 hours. Thyroid Function Tests: No results for input(s): TSH, T4TOTAL, FREET4, T3FREE, THYROIDAB in the last 72 hours. Anemia Panel: No results for input(s): VITAMINB12, FOLATE, FERRITIN, TIBC, IRON, RETICCTPCT in the last 72 hours. Sepsis Labs: No results for input(s): PROCALCITON, LATICACIDVEN in the last 168 hours.  No results found for this or any previous visit (from the past 240 hour(s)).       Radiology Studies: No results found.      Scheduled Meds: . amiodarone  400 mg Oral Q8H  . aspirin EC  81 mg Oral Daily  . atorvastatin  40 mg Oral Daily  . colchicine  0.6 mg Oral Daily  . enoxaparin (LOVENOX) injection  30 mg Subcutaneous Q24H  . feeding supplement (ENSURE ENLIVE)  237 mL Oral TID BM  . metoprolol  75 mg Oral BID  . mirtazapine  7.5 mg Oral QHS  . multivitamin with minerals  1 tablet Oral Daily  . pantoprazole  40 mg Oral BID   Continuous Infusions:   LOS: 12 days    Time spent: 35 minutes    Loretha Stapler, MD [facility] Pager [phone]  If 7PM-7AM, please contact night-coverage [email or web address] Password [facility] 08/17/2016, 8:47 AM

## 2016-08-17 NOTE — Clinical Social Work Note (Signed)
Another message left for son Edd Arbour regarding bed offers and need for decision on facility choice.   Liz Beach MSW, [place], [place], [id number]

## 2016-08-17 NOTE — Progress Notes (Signed)
Nutrition Follow-up  DOCUMENTATION CODES:   Non-severe (moderate) malnutrition in context of chronic illness  INTERVENTION:    Continue Ensure Enlive PO BID, each supplement provides 350 kcal and 20 grams of protein  Magic Cup (290 kcal and 9 grams of protein) or Mighty Shake II (480-500 kcals and 20-23 grams of protein) with meals  Continue MVI daily  NUTRITION DIAGNOSIS:   Malnutrition related to chronic illness, poor appetite as evidenced by energy intake < 75% for > or equal to 1 month, mild depletion of body fat, moderate depletions of muscle mass.  Ongoing  GOAL:   Patient will meet greater than or equal to 90% of their needs  Progressing  MONITOR:   PO intake, Supplement acceptance, Weight trends, Skin, I & O's  ASSESSMENT:   83 year old male with known hx CAD, CHF, HTN. Admitted on 12/21 with incarcerated hernia and is s/p Lap hernia repair 12/21.  Patient is receiving dysphagia 3 diet with thin liquids. He continues to have a poor appetite and has been consuming 20-50% of meals. He does not really like Ensure Enlive, but says he can drink it if he has to. Discussed different supplement options with patient. Will send magic cup or mighty shake II with meals.  Diet Order:  DIET DYS 3 Room service appropriate? Yes; Fluid consistency: Thin  Skin:  Reviewed, no issues  Last BM:  1/2  Height:   Ht Readings from Last 1 Encounters:  08/05/16 5\' 11"  (1.803 m)    Weight:   Wt Readings from Last 1 Encounters:  08/17/16 153 lb 14.4 oz (69.8 kg)    Ideal Body Weight:  78.2 kg  BMI:  Body mass index is 21.46 kg/m.  Estimated Nutritional Needs:   Kcal:  1900-2100  Protein:  95-105 gm  Fluid:  2 L  EDUCATION NEEDS:   No education needs identified at this time  Molli Barrows, [place], [place], [place] Pager [phone] After Hours Pager [phone]

## 2016-08-18 DIAGNOSIS — Z7901 Long term (current) use of anticoagulants: Secondary | ICD-10-CM

## 2016-08-18 DIAGNOSIS — I481 Persistent atrial fibrillation: Secondary | ICD-10-CM

## 2016-08-18 DIAGNOSIS — R262 Difficulty in walking, not elsewhere classified: Secondary | ICD-10-CM

## 2016-08-18 DIAGNOSIS — I4819 Other persistent atrial fibrillation: Secondary | ICD-10-CM

## 2016-08-18 LAB — BASIC METABOLIC PANEL
ANION GAP: 11 (ref 5–15)
BUN: 15 mg/dL (ref 6–20)
CO2: 23 mmol/L (ref 22–32)
Calcium: 8.4 mg/dL — ABNORMAL LOW (ref 8.9–10.3)
Chloride: 106 mmol/L (ref 101–111)
Creatinine, Ser: 1.15 mg/dL (ref 0.61–1.24)
GFR, EST NON AFRICAN AMERICAN: 57 mL/min — AB (ref >60)
GLUCOSE: 98 mg/dL (ref 65–99)
POTASSIUM: 3.5 mmol/L (ref 3.5–5.1)
SODIUM: 140 mmol/L (ref 135–145)

## 2016-08-18 LAB — CBC
HCT: 30.6 % — ABNORMAL LOW (ref 39.0–52.0)
Hemoglobin: 10 g/dL — ABNORMAL LOW (ref 13.0–17.0)
MCH: 28.7 pg (ref 26.0–34.0)
MCHC: 32.7 g/dL (ref 30.0–36.0)
MCV: 87.7 fL (ref 78.0–100.0)
Platelets: 334 10*3/uL (ref 150–400)
RBC: 3.49 MIL/uL — AB (ref 4.22–5.81)
RDW: 15.9 % — ABNORMAL HIGH (ref 11.5–15.5)
WBC: 9.3 10*3/uL (ref 4.0–10.5)

## 2016-08-18 NOTE — Progress Notes (Signed)
Physical Therapy Treatment Patient Details Name: Gregory [name] MRN: [id number] DOB: Jun 26, 1930 Today's Date: 08/18/2016    History of Present Illness Pt is a 83 year old male with a history of HTN, nonhemorrhagic CVA, GI bleed, STEMI 03/2016, CHF, A. Fib on Eliquis with abdominal pain.  Pt s/p  Diagnostic laparoscopy, reduction of strangulated small bowel from left inguinal hernia, TAPP repair with phasix mesh, small bowel resection.   Also complication of tamponade symptoms, s/p pericardiocentesis.    PT Comments    Patient's mobility limited today secondary to hypotension. Pt asymptomatic however RN notified as pt outside of parameters per MD.  Supine BP 72/60 LUE Supine BP 96/68 RUE Sitting BP post exercise 89/72 Tolerated exercises sitting EOB. Pt fatigues with exercise requiring rest breaks between sets. VSS throughout. Will follow to progress OOB mobility and gait training as appropriate and safe.    Follow Up Recommendations  SNF     Equipment Recommendations  Rolling walker with 5" wheels    Recommendations for Other Services       Precautions / Restrictions Precautions Precautions: Fall Precaution Comments: Abdominal incision and HR <150; keep SBP >90 Restrictions Weight Bearing Restrictions: No    Mobility  Bed Mobility Overal bed mobility: Needs Assistance Bed Mobility: Supine to Sit;Sit to Supine     Supine to sit: Min assist;HOB elevated Sit to supine: Min guard   General bed mobility comments: increased time, verbal cues. Assist to get to EOB as pt does not have UE ROM to grab rail.  Transfers Overall transfer level:  (Deferred secondary to soft BP)                  Ambulation/Gait                 Stairs            Wheelchair Mobility    Modified Rankin (Stroke Patients Only)       Balance Overall balance assessment: Needs assistance Sitting-balance support: Feet supported;No upper extremity supported Sitting  balance-Leahy Scale: Fair Sitting balance - Comments: posterior lean when performing LE exercises but no overt LOB. Postural control: Posterior lean     Standing balance comment: Deferred secondary to soft BP.                    Cognition Arousal/Alertness: Awake/alert Behavior During Therapy: WFL for tasks assessed/performed Overall Cognitive Status: Within Functional Limits for tasks assessed                      Exercises General Exercises - Lower Extremity Ankle Circles/Pumps: Both;20 reps;Seated Short Arc Quad: Both;10 reps;Supine Long Arc Quad: Both;20 reps;Seated Hip ABduction/ADduction: Both;20 reps;Seated Hip Flexion/Marching: Both;20 reps;Seated Toe Raises: Both;20 reps;Seated Heel Raises: Both;20 reps;Seated    General Comments General comments (skin integrity, edema, etc.): Soft BP. Rn notified and came to room.      Pertinent Vitals/Pain Pain Assessment: No/denies pain    Home Living                      Prior Function            PT Goals (current goals can now be found in the care plan section) Progress towards PT goals: Not progressing toward goals - comment (secondary to soft BP)    Frequency    Min 3X/week      PT Plan Current plan remains appropriate    Co-evaluation  End of Session Equipment Utilized During Treatment: Gait belt Activity Tolerance: Treatment limited secondary to medical complications (Comment) (soft BP) Patient left: in bed;with call bell/phone within reach;with bed alarm set     Time: [id number] PT Time Calculation (min) (ACUTE ONLY): 25 min  Charges:  $Therapeutic Exercise: 23-37 mins                    G Codes:      Gregory [name] 08/18/2016, 3:44 PM Gregory [name], [place], DPT [phone]

## 2016-08-18 NOTE — Progress Notes (Signed)
Patient ID: Gregory [name], male   DOB: 11/22/1929, 83 y.o.   MRN: [id number]  [facility] Surgery Progress Note  12 Days Post-Op  Subjective: Denies any current abdominal pain, nausea, or vomiting. Tolerating diet. Had BM yesterday (nonbloody). Worked with PT yesterday.  Objective: Vital signs in last 24 hours: Temp:  [97.7 F (36.5 C)-97.9 F (36.6 C)] 97.9 F (36.6 C) (01/03 0353) Pulse Rate:  [87-118] 87 (01/03 0353) Resp:  [20-27] 25 (01/03 0353) BP: (102-120)/(74-84) 102/77 (01/03 0353) SpO2:  [98 %-100 %] 98 % (01/03 0353) Weight:  [149 lb 6.4 oz (67.8 kg)] 149 lb 6.4 oz (67.8 kg) (01/03 0353) Last BM Date: 08/17/16  Intake/Output from previous day: 01/02 0701 - 01/03 0700 In: 780 [P.O.:780] Out: -  Intake/Output this shift: No intake/output data recorded.  PE: Gen:  Alert, NAD, pleasant Card:  Regular, tachy Pulm:  CTAB, no W/R/R, effort normal Abd: Soft, NT/ND, +BS, incisions C/D/I with steris in place  Lab Results:   Recent Labs  08/17/16 0708 08/18/16 0559  WBC 10.4 9.3  HGB 11.1* 10.0*  HCT 34.7* 30.6*  PLT 374 334   BMET  Recent Labs  08/17/16 0708 08/18/16 0559  NA 140 140  K 4.0 3.5  CL 106 106  CO2 24 23  GLUCOSE 71 98  BUN 16 15  CREATININE 1.08 1.15  CALCIUM 8.3* 8.4*   PT/INR No results for input(s): LABPROT, INR in the last 72 hours. CMP     Component Value Date/Time   NA 140 08/18/2016 0559   K 3.5 08/18/2016 0559   CL 106 08/18/2016 0559   CO2 23 08/18/2016 0559   GLUCOSE 98 08/18/2016 0559   BUN 15 08/18/2016 0559   CREATININE 1.15 08/18/2016 0559   CREATININE 0.67 (L) 12020-05-216 0825   CALCIUM 8.4 (L) 08/18/2016 0559   PROT 5.4 (L) 08/06/2016 0119   ALBUMIN 2.9 (L) 08/06/2016 0119   AST 28 08/06/2016 0119   ALT 15 (L) 08/06/2016 0119   ALKPHOS 59 08/06/2016 0119   BILITOT 4.8 (H) 08/06/2016 0119   GFRNONAA 57 (L) 08/18/2016 0559   GFRAA >60 08/18/2016 0559   Lipase     Component Value Date/Time   LIPASE  27 08/05/2016 0726       Studies/Results: No results found.  Anti-infectives: Anti-infectives    Start     Dose/Rate Route Frequency Ordered Stop   08/06/16 0600  ceFAZolin (ANCEF) IVPB 2g/100 mL premix  Status:  Discontinued     2 g 200 mL/hr over 30 Minutes Intravenous On call to O.R. 08/05/16 1219 08/05/16 1815   08/06/16 0600  metroNIDAZOLE (FLAGYL) IVPB 500 mg     500 mg 100 mL/hr over 60 Minutes Intravenous On call to O.R. 08/05/16 1417 08/05/16 1456   08/05/16 1445  ceFAZolin (ANCEF) IVPB 2g/100 mL premix     2 g 200 mL/hr over 30 Minutes Intravenous On call to O.R. 08/05/16 1417 08/05/16 1444       Assessment/Plan Incarcerated left inguinal hernia s/p Procedure(s): Laparoscopic repair of incarcerated inguinal hernia with small bowel resection 12/21, Dr. Kae Heller  - POD 13  Bloody stools - Hg 10.1, BM yesterday non-bloody. Continue protonix Pericardial effusion/tamponade - s/p pericardiocentesis 12/22 Atrial fibrillation wit hRVR - Amiodarone 400mg  po TID and metoprolol 100mg  BID per cardiology.  CAD s/p STEMI 03/2016 - on atorvastatin and metoprolol per cardiology dCHF HLD  VTE - eliquis FEN - dysphagia 3 diet  Plan - tolerating diet and having  bowel function. From surgical standpoint patient is ready for discharge. He will follow-up with Dr. Kae Heller in 1-2 weeks.   LOS: 13 days    Jerrye Beavers , [facility] Surgery 08/18/2016, 9:02 AM Pager: [phone] Consults: [phone] Mon-Fri 7:00 am-4:30 pm Sat-Sun 7:00 am-11:30 am

## 2016-08-18 NOTE — Clinical Social Work Note (Signed)
Per pt's son at this time [name] is the facility of choice. Pt's son inquiring about when pt will d/c so pt's son can take care of any needs at that time. CSW spoke with [facility] and pt will have test done today, no determined d/c at this point--pt's son informed.  [address], [place]

## 2016-08-18 NOTE — Progress Notes (Signed)
[facility]                                                                              Patient Demographics  Gregory [name], is a 83 y.o. male, DOB - 1929/09/27, HC:[id number]  Admit date - 08/05/2016   Admitting Physician Md Edison Pace, MD  Outpatient Primary MD for the patient is [name], MD  Outpatient specialists:   LOS - 13  days    Chief Complaint  Patient presents with  . Abdominal Pain       Brief summary   Patient is a 83 year old gentleman history of hypertension, nonhemorrhagic CVA, history of GI bleed, coronary artery disease status post STEMI August 2017, CHF, atrial fibrillation on anticoagulation who presented to the ED with complaints of progressively worsening low abdominal pain nausea vomiting. Patient noted to have an incarcerated left inguinal hernia and underwent emergent diagnostic laparoscopy with reduction of strangulated small bowel from left inguinal hernia,TAPP repair with phasix mesh, small bowel resection per Dr. Windle Guard 08/05/2016. Patient was assessed by cardiology preoperatively for clearance a repeat 2-D echo was done postoperatively to assess for pericardial effusion and patient noted to have a large per cardiology effusion with evidence of tamponade physiology including hypotension in the setting as SVT. Patient subsequently underwent pericardiocentesis per Dr. Burt Knack 08/06/2016. Patient also noted to be in A. fib with RVR and currently on amiodarone and beta blocker. Anticoagulation on hold secondary to concern for hemorrhagic compression would recent pericardiocentesis. Cardiology following. Patient also noted to have melanotic stools 08/13/2016 as well as 08/14/2016. H&H stable. General surgery following. [facility] were asked to take over patient's care 08/13/2016.    Assessment & Plan    Incarcerated left inguinal hernia - Status post Laparoscopic repair of incarcerated inguinal hernia with small bowel resection per  Dr. Kae Heller 08/05/2016 - Previous melanotic stools may be secondary from anastomosis site - H/H improved stable - Management per Gen Surg   Melanotic stools/postop acute blood loss anemia - H&H currently stable, BM 1/2 nonbloody  - Continue PPI   Pericardial effusion/tamponade -Patient initially had tamponade features requiring pericardiocentesis per Dr. Burt Knack 08/06/2016, Drain removed on  08/08/2016 - 2-D echo from 08/13/2016 with moderate-sized pleural effusion without tamponade  Atrial fibrillation with RVR - Continues to have elevated heart rate in 120s despite amiodarone high dose 400 mg TID and metoprolol 100mg  BID - Patient started on Eliquis on 1/2 by cardiology.  - Cardiology recommending possible TEE guided cardioversion later this week  Coronary artery disease status post STEMI August 2017 -Currently asymptomatic -Continue Lipitor, Lopressor, amiodarone, eliquis  Hypertension -Continue metoprolol.  Chronic diastolic heart failure -Compensated -Continue aspirin, Lipitor, Lopressor.  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  Hyperlipidemia -Continue statin.  Deconditioning - PTOT evaluation recommended skilled nursing facility  Code Status: full  DVT Prophylaxis:  eliquis  Family Communication: Discussed in detail with the patient, all imaging results, lab results explained to the patient  Disposition Plan:  snf   Time Spent in minutes  25 minutes  Procedures:  CT abdomen and pelvis 08/05/2016  X-ray of the left foot 08/13/2016  Chest x-ray 08/05/2016, 08/08/2016  2-D echo 08/13/2016, 08/10/2016, 08/08/2016, 08/06/2016  Cardiac catheterization/pericardiocentesis 08/06/2016--- per Dr. Burt Knack  Bilateral lower extremity Dopplers 08/05/2016  Diagnostic laparoscopy, reduction of strangulated small bowel from left  inguinal hernia,TAPP repair with phasix mesh, small bowel resection per Dr. Windle Guard 08/05/2016  Consultants:    Cardiology  [place] Admitted to [facility] (General Surgery) for incarcerated left inguinal hernia  Antimicrobials:   1 dose of Ancef on 12/21   Medications  Scheduled Meds: . amiodarone  400 mg Oral Q8H  . apixaban  5 mg Oral BID  . atorvastatin  40 mg Oral Daily  . colchicine  0.6 mg Oral Daily  . feeding supplement (ENSURE ENLIVE)  237 mL Oral BID BM  . metoprolol  100 mg Oral BID  . mirtazapine  7.5 mg Oral QHS  . multivitamin with minerals  1 tablet Oral Daily  . pantoprazole  40 mg Oral BID   Continuous Infusions: PRN Meds:.acetaminophen **OR** acetaminophen, diphenhydrAMINE **OR** [DISCONTINUED] diphenhydrAMINE, fentaNYL (SUBLIMAZE) injection, hydrALAZINE, HYDROmorphone (DILAUDID) injection, metoprolol, ondansetron **OR** ondansetron (ZOFRAN) IV, oxyCODONE   Antibiotics   Anti-infectives    Start     Dose/Rate Route Frequency Ordered Stop   08/06/16 0600  ceFAZolin (ANCEF) IVPB 2g/100 mL premix  Status:  Discontinued     2 g 200 mL/hr over 30 Minutes Intravenous On call to O.R. 08/05/16 1219 08/05/16 1815   08/06/16 0600  metroNIDAZOLE (FLAGYL) IVPB 500 mg     500 mg 100 mL/hr over 60 Minutes Intravenous On call to O.R. 08/05/16 1417 08/05/16 1456   08/05/16 1445  ceFAZolin (ANCEF) IVPB 2g/100 mL premix     2 g 200 mL/hr over 30 Minutes Intravenous On call to O.R. 08/05/16 1417 08/05/16 1444        Subjective:   Gregory [name] was seen and examined today.  No significant complaints except heart rate not well controlled at the time of examination, in 100-120's on tele.  Patient denies dizziness, chest pain, shortness of breath, abdominal pain, N/V/D/C, new weakness, numbess, tingling. No acute events overnight.    Objective:   Vitals:   08/17/16 1700 08/17/16 2015 08/18/16 0353 08/18/16 1510  BP:  102/84 102/77 96/68  Pulse:  (!) 118 87 84  Resp:  (!) 27  (!) 25 (!) 22  Temp: 97.7 F (36.5 C) 97.8 F (36.6 C) 97.9 F (36.6 C) 97.9 F (36.6 C)  TempSrc:  Axillary Oral Oral  SpO2:  98% 98% 97%  Weight:   67.8 kg (149 lb 6.4 oz)   Height:        Intake/Output Summary (Last 24 hours) at 08/18/16 1600 Last data filed at 08/17/16 2023  Gross per 24 hour  Intake              240 ml  Output                0 ml  Net              240 ml     Wt Readings from Last 3 Encounters:  08/18/16 67.8 kg (149 lb 6.4 oz)  07/20/16 78.6 kg (173 lb 3.2 oz)  04/14/16 78.3 kg (172 lb 9.6 oz)     Exam  General: Alert and oriented x 3, NAD  HEENT:  PERRLA, EOMI, Anicteric Sclera, mucous membranes moist.   Neck: Supple, no JVD, no masses  Cardiovascular: S1 S2  auscultated, tachycardia   Respiratory: Clear to auscultation bilaterally, no wheezing, rales or rhonchi  Gastrointestinal: Soft, nontender, nondistended, + bowel sounds  Ext: no cyanosis clubbing or edema  Neuro: AAOx3, Cr N's II- XII. Strength 5/5 upper and lower extremities bilaterally  Skin: No rashes  Psych: Normal affect and demeanor, alert and oriented x3    Data Reviewed:  I have personally reviewed following labs and imaging studies  Micro Results Recent Results (from the past 240 hour(s))  Culture, Urine     Status: Abnormal   Collection Time: 08/16/16  1:58 PM  Result Value Ref Range Status   Specimen Description URINE, RANDOM  Final   Special Requests NONE  Final   Culture MULTIPLE SPECIES PRESENT, SUGGEST RECOLLECTION (A)  Final   Report Status 08/17/2016 FINAL  Final    Radiology Reports Ct Abdomen Pelvis W Contrast  Result Date: 08/05/2016 CLINICAL DATA:  Upper abdominal pain and nausea EXAM: CT ABDOMEN AND PELVIS WITH CONTRAST TECHNIQUE: Multidetector CT imaging of the abdomen and pelvis was performed using the standard protocol following bolus administration of intravenous contrast. CONTRAST:  159mL ISOVUE-300 IOPAMIDOL (ISOVUE-300) INJECTION 61%  COMPARISON:  03/30/2016. FINDINGS: Lower chest: Lung bases demonstrate some mild dependent atelectatic changes. A large pericardial effusion is identified which is increased in the interval from the prior MRI of 03/30/2016. It measures approximately 3.7 cm along the posterior wall of the left ventricle. Hepatobiliary: Mild early heterogeneity to the liver is noted related to the timing of the contrast bolus. Multiple gallstones are seen without complicating factors. Pancreas: Unremarkable. No pancreatic ductal dilatation or surrounding inflammatory changes. Spleen: Normal in size without focal abnormality. Adrenals/Urinary Tract: Adrenal glands are unremarkable. Kidneys are normal, without renal calculi, focal lesion, or hydronephrosis. Bladder is decompressed. Stomach/Bowel: Scattered diverticular changes noted without evidence of diverticulitis. The appendix is not well visualized although no inflammatory changes to suggest appendicitis are seen. There is evidence of small bowel dilatation secondary to a left inguinal hernia. A loop of small bowel extends into the hernia and is within normal limits distal to this although obstructed proximally. Vascular/Lymphatic: Aortic atherosclerosis. No enlarged abdominal or pelvic lymph nodes. Reproductive: Prostate appears to have been surgically removed. Other: No abdominal wall hernia or abnormality. No abdominopelvic ascites. Musculoskeletal: No acute or significant osseous findings. IMPRESSION: Left inguinal hernia containing a loop of small bowel with evidence of incarceration and proximal obstruction. Enlarging pericardial effusion as described above. Cholelithiasis without complicating factors. Diverticulosis without diverticulitis. Electronically Signed   By: Inez Catalina M.D.   On: 08/05/2016 09:29   Dg Chest Port 1 View  Result Date: 08/08/2016 CLINICAL DATA:  Status post removal of pericardial drain. EXAM: PORTABLE CHEST 1 VIEW COMPARISON:  Single-view of the  chest 08/05/2016. Scratch the single view of the chest 08/05/2016 and 03/26/2016. FINDINGS: Cardiopericardial silhouette appears enlarged but smaller than on the most recent exam. Left pleural effusion and basilar airspace disease are noted. The right lung appears clear. No pneumothorax. Aortic atherosclerosis is noted. IMPRESSION: Left pleural effusion and basilar airspace disease, likely atelectasis. Enlarged cardiopericardial silhouette appears smaller than on the most recent exam. Atherosclerosis. Electronically Signed   By: Inge Rise M.D.   On: 08/08/2016 11:28   Dg Chest Port 1 View  Result Date: 08/05/2016 CLINICAL DATA:  Peripheral effusion, shortness of breath EXAM: PORTABLE CHEST 1 VIEW COMPARISON:  CT chest 03/27/2016 FINDINGS: There is no focal parenchymal opacity. There is no pleural effusion or pneumothorax. The cardiac silhouette is enlarged likely  reflecting a pericardial effusion given the prior exams. The osseous structures are unremarkable. IMPRESSION: 1. Enlarged cardiac silhouette likely reflecting a pericardial effusion. Electronically Signed   By: Kathreen Devoid   On: 08/05/2016 13:43   Dg Foot Complete Left  Result Date: 08/13/2016 CLINICAL DATA:  Pain all over left foot.  No known injury. EXAM: LEFT FOOT - COMPLETE 3+ VIEW COMPARISON:  None. FINDINGS: Hallux valgus deformity. Small plantar calcaneal spur. No acute bony abnormality. Specifically, no fracture, subluxation, or dislocation. Soft tissues are intact. IMPRESSION: No acute bony abnormality.  Hallux valgus. Electronically Signed   By: Rolm Baptise M.D.   On: 08/13/2016 09:54    Lab Data:  CBC:  Recent Labs Lab 08/14/16 0535 08/15/16 0612 08/16/16 0243 08/17/16 0708 08/18/16 0559  WBC 15.4* 13.9* 12.8* 10.4 9.3  NEUTROABS  --   --   --  8.2*  --   HGB 9.4* 9.6* 9.5* 11.1* 10.0*  HCT 27.0* 29.2* 28.1* 34.7* 30.6*  MCV 82.6 86.4 84.4 88.1 87.7  PLT 300 340 374 374 [id number]   Basic Metabolic  Panel:  Recent Labs Lab 08/14/16 0535 08/15/16 0612 08/16/16 0243 08/17/16 0708 08/18/16 0559  NA 137 138 140 140 140  K 4.5 3.4* 3.7 4.0 3.5  CL 103 101 104 106 106  CO2 26 27 26 24 23   GLUCOSE 112* 91 98 71 98  BUN 17 17 19 16 15   CREATININE 0.88 0.98 1.08 1.08 1.15  CALCIUM 8.2* 8.3* 8.4* 8.3* 8.4*  MG 2.1 2.2  --   --   --    GFR: Estimated Creatinine Clearance: 45.9 mL/min (by C-G formula based on SCr of 1.15 mg/dL). Liver Function Tests: No results for input(s): AST, ALT, ALKPHOS, BILITOT, PROT, ALBUMIN in the last 168 hours. No results for input(s): LIPASE, AMYLASE in the last 168 hours. No results for input(s): AMMONIA in the last 168 hours. Coagulation Profile: No results for input(s): INR, PROTIME in the last 168 hours. Cardiac Enzymes: No results for input(s): CKTOTAL, CKMB, CKMBINDEX, TROPONINI in the last 168 hours. BNP (last 3 results) No results for input(s): PROBNP in the last 8760 hours. HbA1C: No results for input(s): HGBA1C in the last 72 hours. CBG: No results for input(s): GLUCAP in the last 168 hours. Lipid Profile: No results for input(s): CHOL, HDL, LDLCALC, TRIG, CHOLHDL, LDLDIRECT in the last 72 hours. Thyroid Function Tests: No results for input(s): TSH, T4TOTAL, FREET4, T3FREE, THYROIDAB in the last 72 hours. Anemia Panel: No results for input(s): VITAMINB12, FOLATE, FERRITIN, TIBC, IRON, RETICCTPCT in the last 72 hours. Urine analysis:    Component Value Date/Time   COLORURINE AMBER (A) 08/16/2016 1358   APPEARANCEUR HAZY (A) 08/16/2016 1358   LABSPEC 1.019 08/16/2016 1358   PHURINE 6.0 08/16/2016 1358   GLUCOSEU NEGATIVE 08/16/2016 1358   HGBUR NEGATIVE 08/16/2016 1358   BILIRUBINUR NEGATIVE 08/16/2016 1358   KETONESUR NEGATIVE 08/16/2016 1358   PROTEINUR NEGATIVE 08/16/2016 1358   UROBILINOGEN 4.0 (H) 01/30/2014 1402   NITRITE NEGATIVE 08/16/2016 1358   LEUKOCYTESUR NEGATIVE 08/16/2016 [place] M.D. [facility] 08/18/2016, 4:00 PM  Pager: [phone] Between 7am to 7pm - call Pager - [phone]  After 7pm go to [email or web address] - password [email or web address]  Call night coverage person covering after 7pm

## 2016-08-18 NOTE — Progress Notes (Signed)
Pt B/P mopre soft today on the 100mg  of Metoprolol. Will notify PA on call. B/P 89/65

## 2016-08-18 NOTE — Progress Notes (Signed)
Patient Name: Gregory [name] Date of Encounter: 08/18/2016  Primary Cardiologist: Dr. Clearnce Hasten Problem List     Principal Problem:   Incarcerated left inguinal hernia Active Problems:   Hypertension   Hyperlipidemia   PAF (paroxysmal atrial fibrillation) (HCC)   CAD (coronary artery disease)   Chronic diastolic congestive heart failure (HCC)   Incarcerated inguinal hernia   Preop cardiovascular exam   Malnutrition of moderate degree   Hypotension   Cardiac tamponade   Pericardial effusion   Hypokalemia   Melanotic stools   Postoperative anemia due to acute blood loss     Subjective   Feels ok, no chest pain or SOB.   Inpatient Medications    Scheduled Meds: . amiodarone  400 mg Oral Q8H  . apixaban  5 mg Oral BID  . atorvastatin  40 mg Oral Daily  . colchicine  0.6 mg Oral Daily  . feeding supplement (ENSURE ENLIVE)  237 mL Oral BID BM  . metoprolol  100 mg Oral BID  . mirtazapine  7.5 mg Oral QHS  . multivitamin with minerals  1 tablet Oral Daily  . pantoprazole  40 mg Oral BID   Continuous Infusions:  PRN Meds: acetaminophen **OR** acetaminophen, diphenhydrAMINE **OR** [DISCONTINUED] diphenhydrAMINE, fentaNYL (SUBLIMAZE) injection, hydrALAZINE, HYDROmorphone (DILAUDID) injection, metoprolol, ondansetron **OR** ondansetron (ZOFRAN) IV, oxyCODONE   Vital Signs    Vitals:   08/17/16 1435 08/17/16 1700 08/17/16 2015 08/18/16 0353  BP: 120/74  102/84 102/77  Pulse: (!) 117  (!) 118 87  Resp: 20  (!) 27 (!) 25  Temp: 97.8 F (36.6 C) 97.7 F (36.5 C) 97.8 F (36.6 C) 97.9 F (36.6 C)  TempSrc: Oral  Axillary Oral  SpO2: 100%  98% 98%  Weight:    149 lb 6.4 oz (67.8 kg)  Height:        Intake/Output Summary (Last 24 hours) at 08/18/16 1306 Last data filed at 08/17/16 2023  Gross per 24 hour  Intake              240 ml  Output                0 ml  Net              240 ml   Filed Weights   08/16/16 0548 08/17/16 0302 08/18/16 0353    Weight: 152 lb 12.8 oz (69.3 kg) 153 lb 14.4 oz (69.8 kg) 149 lb 6.4 oz (67.8 kg)    Physical Exam    GEN: Well nourished, well developed, in no acute distress.  HEENT: Grossly normal.  Neck: Supple, no JVD, carotid bruits, or masses. Cardiac: RRR, no murmurs, rubs, or gallops. No clubbing, cyanosis, edema.  Radials/DP/PT 2+ and equal bilaterally.  Respiratory:  Respirations regular and unlabored, clear to auscultation bilaterally. GI: Soft, nontender, nondistended, BS + x 4. MS: no deformity or atrophy. Skin: warm and dry, no rash. Neuro:  Strength and sensation are intact. Psych: AAOx3.  Normal affect.  Labs    CBC  Recent Labs  08/17/16 0708 08/18/16 0559  WBC 10.4 9.3  NEUTROABS 8.2*  --   HGB 11.1* 10.0*  HCT 34.7* 30.6*  MCV 88.1 87.7  PLT 374 [id number]   Basic Metabolic Panel  Recent Labs  08/17/16 0708 08/18/16 0559  NA 140 140  K 4.0 3.5  CL 106 106  CO2 24 23  GLUCOSE 71 98  BUN 16 15  CREATININE 1.08 1.15  CALCIUM  8.3* 8.4*     Telemetry    Afib - Personally Reviewed  ECG    Afib with RBBB - Personally Reviewed  Radiology    No results found.  Cardiac Studies  Transthoracic Echocardiography 08/13/16 Study Conclusions  - Left ventricle: The cavity size was normal. There was severe concentric hypertrophy. LV mid-cavity obliteration with LV mid-cavity gradient to 22 mmHg peak. Systolic function was normal. The estimated ejection fraction was in the range of 55% to 60%. Indeterminant diastolic function (not assessed). Wall motion was normal; there were no regional wall motion abnormalities. - Aortic valve: There was no stenosis. - Mitral valve: Mildly calcified annulus. There was trivial regurgitation. - Left atrium: The atrium was mildly to moderately dilated. - Right ventricle: The cavity size was normal. Wall thickness was mildly to moderately increased. Systolic function was mildly to moderately reduced. - Right  atrium: The atrium was mildly dilated. - Pulmonary arteries: No complete TR doppler jet so unable to estimate PA systolic pressure. - Systemic veins: IVC not visualized. - Pericardium, extracardiac: Moderate circumferential pericardial effusion, primarily located posteriorly. 2.0 cm thickness, similar to prior. There does not appear to be tamponade present.  Impressions:  - Normal LV size with severe concentric LV hypertrophy, EF 55-60%. Normal RV size with mild to moderate RV hypertrophy and mild to moderate RV systolic dysfunction. Moderate pericardial effusion without tamponade, primarily posterior, similar to prior. Findings suggestive of cardiac amyloidosis.   Patient Profile     [name] is a 83 year old male with a past medical history of HTN, nonhemorrhagic CVA, GI bleed, STEMI 03/2016, CHF, PAF on Eliquis who presented to the [facility] ED on 12/21/17with complaints of progressively worsening lower abdominal pain; s/p laparoscopic repair of strangulated left inguinal hernia with phasix mesh and short segment small bowel resection.   Found to have a pericardial effusion with tamponade features post abdominal surgery, s/p pericardicentesis. Pericardial drain pulled on 08/08/16. Repeat Echo shows no tamponade. 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   Also developed Afib RVR post surgery, started on Amio and beta blocker.     Patient Profile     [name] is a 83 year old male with a past medical history of HTN, nonhemorrhagic CVA, GI bleed, STEMI 03/2016, CHF, PAF on Eliquis who presented to the [facility] ED on 12/21/17with complaints of progressively worsening lower abdominal pain; s/p laparoscopic repair of strangulated left inguinal hernia with phasix mesh and short segment small bowel resection.   Found to have a pericardial effusion with tamponade features post abdominal surgery, s/p pericardicentesis. Pericardial drain pulled on 08/08/16. Repeat Echo  shows no tamponade. 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   Also developed Afib RVR post surgery, started on Amio and beta blocker.    Assessment & Plan    1. Pericardial Effusion: with residual posterior effusion.  2. Atrial fibrillation with RVR: Continues to have elevated HR's in the 100-120's. He is currently on Amiodarone 400mg  po TID and metoprolol 100mg  BID.   Started Eliquis yesterday.   Can do TEE/DCCV on Friday, will have had 6 doses of Eliquis by then. Patient had Afib when he was in the hospital in  August 2017, and he was started on Eliquis during that admission, however Eliquis was stopped    3.CAD: s/p STEMI in Aug. 2017, medical therapy was initiated. No angina. Continue aspirin, atorvastatin, and metoprolol.  4. Possible amyloidosis: Echo is concerning for amyloid as was previous MRI. However given age no good options  for therapy.   5.Acute on chronic diastolic congestive heart failure: Stable,  Appears euvolemic  Signed, Arbutus Leas, NP  08/18/2016, 1:06 PM \  Patient seen and examined. Agree with assessment and plan. AF rate today is finaoly better on amiodarone 400 mg tid and increased lopressor at 100 mg bid. Has received 3 doses of eliquis which was resumed yesterday. He had been anticoagualtion since August but was held since 12/21 and re-started 08/17/2016.  To date he has received ~8.4 grams of amiodarone.  Rate today is now in the 80s's; will tentatively set up for TEE guided  cardioversion on Friday vs home and return in 3 - 4 week.    Troy Sine, MD, [facility] 08/18/2016 2:04 PM

## 2016-08-19 DIAGNOSIS — Z452 Encounter for adjustment and management of vascular access device: Secondary | ICD-10-CM

## 2016-08-19 MED ORDER — POTASSIUM CHLORIDE CRYS ER 20 MEQ PO TBCR
40.0000 meq | EXTENDED_RELEASE_TABLET | Freq: Once | ORAL | Status: AC
Start: 2016-08-19 — End: 2016-08-19
  Administered 2016-08-19: 40 meq via ORAL
  Filled 2016-08-19: qty 2

## 2016-08-19 MED ORDER — DIGOXIN 250 MCG PO TABS
0.2500 mg | ORAL_TABLET | Freq: Once | ORAL | Status: AC
  Administered 2016-08-19: 0.25 mg via ORAL
  Filled 2016-08-19: qty 1

## 2016-08-19 NOTE — Progress Notes (Signed)
Patient Name: Gregory [name] Date of Encounter: 08/19/2016  Primary Cardiologist: Dr. Clearnce Hasten Problem List     Principal Problem:   Incarcerated left inguinal hernia Active Problems:   Hypertension   Hyperlipidemia   PAF (paroxysmal atrial fibrillation) (HCC)   CAD (coronary artery disease)   Chronic diastolic congestive heart failure (HCC)   Incarcerated inguinal hernia   Preop cardiovascular exam   Malnutrition of moderate degree   Hypotension   Cardiac tamponade   Pericardial effusion   Hypokalemia   Melanotic stools   Postoperative anemia due to acute blood loss   Persistent atrial fibrillation (HCC)   Anticoagulated   Difficulty in walking, not elsewhere classified     Subjective   Feels ok today, more tired. Says he has no appetite.   Inpatient Medications    Scheduled Meds: . amiodarone  400 mg Oral Q8H  . apixaban  5 mg Oral BID  . atorvastatin  40 mg Oral Daily  . colchicine  0.6 mg Oral Daily  . feeding supplement (ENSURE ENLIVE)  237 mL Oral BID BM  . metoprolol  100 mg Oral BID  . mirtazapine  7.5 mg Oral QHS  . multivitamin with minerals  1 tablet Oral Daily  . pantoprazole  40 mg Oral BID   Continuous Infusions:  PRN Meds: acetaminophen **OR** acetaminophen, diphenhydrAMINE **OR** [DISCONTINUED] diphenhydrAMINE, fentaNYL (SUBLIMAZE) injection, hydrALAZINE, HYDROmorphone (DILAUDID) injection, metoprolol, ondansetron **OR** ondansetron (ZOFRAN) IV, oxyCODONE   Vital Signs    Vitals:   08/18/16 0353 08/18/16 1510 08/18/16 2118 08/19/16 0523  BP: 102/77 96/68 (!) 86/69 117/74  Pulse: 87 84 (!) 108 (!) 108  Resp: (!) 25 (!) 22 20 (!) 23  Temp: 97.9 F (36.6 C) 97.9 F (36.6 C) 97.9 F (36.6 C) 98.4 F (36.9 C)  TempSrc: Oral Oral Oral Oral  SpO2: 98% 97% 97% 100%  Weight: 149 lb 6.4 oz (67.8 kg)   151 lb 11.2 oz (68.8 kg)  Height:        Intake/Output Summary (Last 24 hours) at 08/19/16 0943 Last data filed at 08/19/16 0908  Gross per 24 hour  Intake              440 ml  Output              350 ml  Net               90 ml   Filed Weights   08/17/16 0302 08/18/16 0353 08/19/16 0523  Weight: 153 lb 14.4 oz (69.8 kg) 149 lb 6.4 oz (67.8 kg) 151 lb 11.2 oz (68.8 kg)    Physical Exam   GEN: Ill appearing male in no acute distress.  HEENT: Grossly normal.  Neck: Supple, no JVD, carotid bruits, or masses. Cardiac: irregularly irregular rhythm, no murmurs, rubs, or gallops. No clubbing, cyanosis, edema.  Radials/DP/PT 2+ and equal bilaterally.  Respiratory:  Respirations regular and unlabored, some diminished rhonchi in bilateral lower lobes.  GI: Soft, nontender, nondistended, BS + x 4. MS: no deformity or atrophy. Skin: warm and dry, no rash. Neuro:  Strength and sensation are intact. Psych: AAOx3.  Normal affect.  Labs    CBC  Recent Labs  08/17/16 0708 08/18/16 0559  WBC 10.4 9.3  NEUTROABS 8.2*  --   HGB 11.1* 10.0*  HCT 34.7* 30.6*  MCV 88.1 87.7  PLT 374 [id number]   Basic Metabolic Panel  Recent Labs  08/17/16 0708 08/18/16 0559  NA  140 140  K 4.0 3.5  CL 106 106  CO2 24 23  GLUCOSE 71 98  BUN 16 15  CREATININE 1.08 1.15  CALCIUM 8.3* 8.4*     Telemetry    Afib- Personally Reviewed  ECG    Afib with RBBB- Personally Reviewed  Radiology    No results found.  Cardiac Studies  Transthoracic Echocardiography 08/13/16 Study Conclusions  - Left ventricle: The cavity size was normal. There was severe concentric hypertrophy. LV mid-cavity obliteration with LV mid-cavity gradient to 22 mmHg peak. Systolic function was normal. The estimated ejection fraction was in the range of 55% to 60%. Indeterminant diastolic function (not assessed). Wall motion was normal; there were no regional wall motion abnormalities. - Aortic valve: There was no stenosis. - Mitral valve: Mildly calcified annulus. There was trivial regurgitation. - Left atrium: The atrium was mildly  to moderately dilated. - Right ventricle: The cavity size was normal. Wall thickness was mildly to moderately increased. Systolic function was mildly to moderately reduced. - Right atrium: The atrium was mildly dilated. - Pulmonary arteries: No complete TR doppler jet so unable to estimate PA systolic pressure. - Systemic veins: IVC not visualized. - Pericardium, extracardiac: Moderate circumferential pericardial effusion, primarily located posteriorly. 2.0 cm thickness, similar to prior. There does not appear to be tamponade present.  Impressions:  - Normal LV size with severe concentric LV hypertrophy, EF 55-60%. Normal RV size with mild to moderate RV hypertrophy and mild to moderate RV systolic dysfunction. Moderate pericardial effusion without tamponade, primarily posterior, similar to prior. Findings suggestive of cardiac amyloidosis.    Patient Profile    Gregory [name] is a 83 year old male with a past medical history of HTN, nonhemorrhagic CVA, GI bleed, STEMI 03/2016, CHF, PAF on Eliquis who presented to the [facility] ED on 12/21/17with complaints of progressively worsening lower abdominal pain; s/p laparoscopic repair of strangulated left inguinal hernia with phasix mesh and short segment small bowel resection.   Found to have a pericardial effusion with tamponade features post abdominal surgery, s/p pericardicentesis. Pericardial drain pulled on 08/08/16. Repeat Echo shows no tamponade. 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   Also developed Afib RVR post surgery, started on Amio and beta blocker.    Assessment & Plan    1. Pericardial Effusion: with residual posterior effusion.  2. Atrial fibrillation with RVR: Continues to have elevated HR's in the 100-120's. He is currently on Amiodarone 400mg  po TID and metoprolol 100mg  BID. Soft BP's today.   Started Eliquis yesterday.   TEE/DCCV on Friday at 13:00, will have had 6 doses of Eliquis by  then. Patient had Afib when he was in the hospital in  August 2017, and he was started on Eliquis during that admission, however Eliquis was stopped from 08/05/16-08/16/16. Restarted on 08/17/16.   3.CAD: s/p STEMI in Aug. 2017, medical therapy was initiated. No angina. Continue aspirin, atorvastatin, and metoprolol.  4. Possible amyloidosis: Echo is concerning for amyloid as was previous MRI. However given age no good options for therapy.   5.Acute on chronic diastolic congestive heart failure: Stable, Appears euvolemic   Signed, Arbutus Leas, NP  08/19/2016, 9:43 AM    Patient seen and examined. Agree with assessment and plan. Pt remains in AF, rate now in 90 - 100 range; beginning to have rate control. He was restarted on eliquis 2 days ago. He had been maintaining sinus rhythm prior to admission on anticoagulation but developed AF with RVR post op and  was found to be in cardiac tamponade necessitaing being off anticogulation. He has been in AF since 12/22, and has been loaded with amiodarone, is on metoprolol with increasing doses and eliquis was resumed on 1/2.  Pt is tentativle on schedule tomorrow for TEE guided cardioversion. I discussed alternative approach now that rate is better controlled to allow addition time for potential pharmacologic cardioversion and longer duration of anticoagulation of 3-4 weeks prior to attempt. Will give  digoxin at 0.25 mg po today; and re-assess strategy with him in am tomorrow.   Troy Sine, MD, [facility] 08/19/2016 12:07 PM

## 2016-08-19 NOTE — Progress Notes (Signed)
Occupational Therapy Treatment Patient Details Name: Gregory [name] MRN: [id number] DOB: [birth year] Today's Date: 08/19/2016    History of present illness Pt is a 83 year old male with a history of HTN, nonhemorrhagic CVA, GI bleed, STEMI 03/2016, CHF, A. Fib on Eliquis with abdominal pain.  Pt s/p  Diagnostic laparoscopy, reduction of strangulated small bowel from left inguinal hernia, TAPP repair with phasix mesh, small bowel resection.   Also complication of tamponade symptoms, s/p pericardiocentesis.   OT comments  Pt tolerated standing at sink with min assist to complete grooming activities then requiring seated rest break prior to finishing grooming. Pt min assist overall for functional mobility due to balance deficits. VSS throughout. D/c plan remains appropriate. Will continue to follow acutely.   Follow Up Recommendations  SNF;Supervision/Assistance - 24 hour    Equipment Recommendations  Other (comment) (TBD)    Recommendations for Other Services      Precautions / Restrictions Precautions Precautions: Fall Precaution Comments: Abdominal incision and HR <150; keep SBP >90 Restrictions Weight Bearing Restrictions: No       Mobility Bed Mobility Overal bed mobility: Needs Assistance Bed Mobility: Supine to Sit     Supine to sit: Supervision;HOB elevated     General bed mobility comments: Supervision for safety with HOB slightly elevated. Increased time required.  Transfers Overall transfer level: Needs assistance Equipment used: Rolling walker (2 wheeled) Transfers: Sit to/from Stand Sit to Stand: Min assist         General transfer comment: Min assist for boost up from EOB and for steadying assist in standing. Cues for hand placement but pt assisting on pulling up with bil UEs on RW.    Balance Overall balance assessment: Needs assistance Sitting-balance support: Feet supported;No upper extremity supported Sitting balance-Leahy Scale: Fair     Standing  balance support: No upper extremity supported;During functional activity Standing balance-Leahy Scale: Poor Standing balance comment: min assist for standing balance                   ADL Overall ADL's : Needs assistance/impaired Eating/Feeding: Set up;Sitting   Grooming: Wash/dry face;Wash/dry hands;Oral care;Sitting;Standing;Minimal assistance Grooming Details (indicate cue type and reason): Min assist for standing balance. Pt able to stand at sink ~5 minutes then fatiguing and requesting to sit down to finish grooming task.             Lower Body Dressing: Moderate assistance Lower Body Dressing Details (indicate cue type and reason): to pull socks up in sitting due to bil shoulder pain and limited ROM Toilet Transfer: Minimal assistance;Ambulation;RW;BSC Toilet Transfer Details (indicate cue type and reason): Simulated by sit to stand from EOB with functional mobility in room         Functional mobility during ADLs: Minimal assistance;Rolling walker General ADL Comments: VSS throghout. Pt quick to fatigue during grooming task standing at the sink.      Vision                     Perception     Praxis      Cognition   Behavior During Therapy: [facility] for tasks assessed/performed Overall Cognitive Status: Within Functional Limits for tasks assessed                       Extremity/Trunk Assessment               Exercises     Shoulder Instructions  General Comments      Pertinent Vitals/ Pain       Pain Assessment: No/denies pain  Home Living                                          Prior Functioning/Environment              Frequency  Min 2X/week        Progress Toward Goals  OT Goals(current goals can now be found in the care plan section)  Progress towards OT goals: Progressing toward goals  Acute Rehab OT Goals Patient Stated Goal: get back home OT Goal Formulation: With patient  Plan  Discharge plan remains appropriate    Co-evaluation                 End of Session Equipment Utilized During Treatment: Gait belt;Rolling walker   Activity Tolerance Patient tolerated treatment well   Patient Left in chair;with call bell/phone within reach;with chair alarm set   Nurse Communication Mobility status        Time: [id number] OT Time Calculation (min): 28 min  Charges: OT General Charges $OT Visit: 1 Procedure OT Treatments $Self Care/Home Management : 23-37 mins  Binnie Kand M.S., OTR/L Pager: [phone]  08/19/2016, 4:28 PM

## 2016-08-19 NOTE — Progress Notes (Signed)
Patient ID: Gregory [name], male   DOB: 02/20/1930, 83 y.o.   MRN: [id number]  [facility] Surgery Progress Note  13 Days Post-Op  Subjective: Feeling well this morning. Tolerating breakfast. Denies abdominal pain, nausea, or vomiting. Had BM yesterday. Awaiting cardioversion.  Objective: Vital signs in last 24 hours: Temp:  [97.9 F (36.6 C)-98.4 F (36.9 C)] 98.4 F (36.9 C) (01/04 0523) Pulse Rate:  [84-108] 108 (01/04 0523) Resp:  [20-23] 23 (01/04 0523) BP: (86-117)/(68-74) 117/74 (01/04 0523) SpO2:  [97 %-100 %] 100 % (01/04 0523) Weight:  [151 lb 11.2 oz (68.8 kg)] 151 lb 11.2 oz (68.8 kg) (01/04 0523) Last BM Date: 08/18/16  Intake/Output from previous day: 01/03 0701 - 01/04 0700 In: 240 [P.O.:240] Out: 350 [Urine:350] Intake/Output this shift: Total I/O In: 200 [P.O.:200] Out: -   PE: Gen:  Alert, NAD, pleasant Card:  Regular, tachy Pulm:  CTAB, no W/R/R, effort normal Abd: Soft, NT/ND, +BS, incisions C/D/I with steris in place  Lab Results:   Recent Labs  08/17/16 0708 08/18/16 0559  WBC 10.4 9.3  HGB 11.1* 10.0*  HCT 34.7* 30.6*  PLT 374 334   BMET  Recent Labs  08/17/16 0708 08/18/16 0559  NA 140 140  K 4.0 3.5  CL 106 106  CO2 24 23  GLUCOSE 71 98  BUN 16 15  CREATININE 1.08 1.15  CALCIUM 8.3* 8.4*   PT/INR No results for input(s): LABPROT, INR in the last 72 hours. CMP     Component Value Date/Time   NA 140 08/18/2016 0559   K 3.5 08/18/2016 0559   CL 106 08/18/2016 0559   CO2 23 08/18/2016 0559   GLUCOSE 98 08/18/2016 0559   BUN 15 08/18/2016 0559   CREATININE 1.15 08/18/2016 0559   CREATININE 0.67 (L) 1Dec 13, 202017 0825   CALCIUM 8.4 (L) 08/18/2016 0559   PROT 5.4 (L) 08/06/2016 0119   ALBUMIN 2.9 (L) 08/06/2016 0119   AST 28 08/06/2016 0119   ALT 15 (L) 08/06/2016 0119   ALKPHOS 59 08/06/2016 0119   BILITOT 4.8 (H) 08/06/2016 0119   GFRNONAA 57 (L) 08/18/2016 0559   GFRAA >60 08/18/2016 0559   Lipase      Component Value Date/Time   LIPASE 27 08/05/2016 0726       Studies/Results: No results found.  Anti-infectives: Anti-infectives    Start     Dose/Rate Route Frequency Ordered Stop   08/06/16 0600  ceFAZolin (ANCEF) IVPB 2g/100 mL premix  Status:  Discontinued     2 g 200 mL/hr over 30 Minutes Intravenous On call to O.R. 08/05/16 1219 08/05/16 1815   08/06/16 0600  metroNIDAZOLE (FLAGYL) IVPB 500 mg     500 mg 100 mL/hr over 60 Minutes Intravenous On call to O.R. 08/05/16 1417 08/05/16 1456   08/05/16 1445  ceFAZolin (ANCEF) IVPB 2g/100 mL premix     2 g 200 mL/hr over 30 Minutes Intravenous On call to O.R. 08/05/16 1417 08/05/16 1444       Assessment/Plan Incarcerated left inguinal hernia s/p Procedure(s): Laparoscopic repair of incarcerated inguinal hernia with small bowel resection 12/21, Dr. Kae Heller  - POD 14 - staples removed yesterday  Bloody stools - Hg 10.1 (1/3), BM yesterday non-bloody. Continue protonix Pericardial effusion/tamponade - s/p pericardiocentesis 12/22 Atrial fibrillation wit hRVR - Amiodarone 400mg  po TID and metoprolol 100mg  BID per cardiology.  CAD s/p STEMI 03/2016 - on atorvastatin and metoprolol per cardiology dCHF HLD  VTE - eliquis FEN - dysphagia 3  diet  Plan - tolerating diet and having bowel function. Staples have been removed. From surgical standpoint patient is ready for discharge. He will follow-up with Dr. Kae Heller in 1-2 weeks. General surgery will sign off, please call with concerns.   LOS: 14 days    Jerrye Beavers , [facility] Surgery 08/19/2016, 9:36 AM Pager: [phone] Consults: [phone] Mon-Fri 7:00 am-4:30 pm Sat-Sun 7:00 am-11:30 am

## 2016-08-19 NOTE — Progress Notes (Signed)
[facility] has been requested to perform a transesophageal echocardiogram on 08/20/16.  After careful review of history and examination, the risks and benefits of transesophageal echocardiogram have been explained including risks of esophageal damage, perforation (1:10,000 risk), bleeding, pharyngeal hematoma as well as other potential complications associated with conscious sedation including aspiration, arrhythmia, respiratory failure and death. Alternatives to treatment were discussed, questions were answered. Patient is willing to proceed.  TEE - Dr. Acie Fredrickson @ 13:00. NPO after midnight. Meds with sips.   Arbutus Leas, NP 08/19/2016 9:48 AM

## 2016-08-19 NOTE — Progress Notes (Signed)
[facility]                                                                              Patient Demographics  Gregory [name], is a 83 y.o. male, DOB - 1930/04/09, [id number]  Admit date - 08/05/2016   Admitting Physician Md Edison Pace, MD  Outpatient Primary MD for the patient is [name], MD  Outpatient specialists:   LOS - 14  days    Chief Complaint  Patient presents with  . Abdominal Pain       Brief summary   Patient is a 83 year old gentleman history of hypertension, nonhemorrhagic CVA, history of GI bleed, coronary artery disease status post STEMI August 2017, CHF, atrial fibrillation on anticoagulation who presented to the ED with complaints of progressively worsening low abdominal pain nausea vomiting. Patient noted to have an incarcerated left inguinal hernia and underwent emergent diagnostic laparoscopy with reduction of strangulated small bowel from left inguinal hernia,TAPP repair with phasix mesh, small bowel resection per Dr. Windle Guard 08/05/2016. Patient was assessed by cardiology preoperatively for clearance a repeat 2-D echo was done postoperatively to assess for pericardial effusion and patient noted to have a large per cardiology effusion with evidence of tamponade physiology including hypotension in the setting as SVT. Patient subsequently underwent pericardiocentesis per Dr. Burt Knack 08/06/2016. Patient also noted to be in A. fib with RVR and currently on amiodarone and beta blocker. Anticoagulation on hold secondary to concern for hemorrhagic compression would recent pericardiocentesis. Cardiology following. Patient also noted to have melanotic stools 08/13/2016 as well as 08/14/2016. H&H stable. General surgery following. [facility] were asked to take over patient's care 08/13/2016.    Assessment & Plan    Incarcerated left inguinal hernia - Status post Laparoscopic repair of incarcerated inguinal hernia with small bowel resection per  Dr. Kae Heller 08/05/2016 - Previous melanotic stools may be secondary from anastomosis site - H/H improved stable - Management per Gen Surg   Melanotic stools/postop acute blood loss anemia - H&H currently stable, BM 1/2 nonbloody  - Continue PPI   Pericardial effusion/tamponade -Patient initially had tamponade features requiring pericardiocentesis per Dr. Burt Knack 08/06/2016, Drain removed on  08/08/2016 - 2-D echo from 08/13/2016 with moderate-sized pleural effusion without tamponade  Atrial fibrillation with RVR - Continues to have elevated heart rate in 120s despite amiodarone high dose 400 mg TID and metoprolol 100mg  BID - Patient started on Eliquis on 1/2 by cardiology.  - Cardiology recommending possible TEE guided cardioversion in am, NPO after midnight   Coronary artery disease status post STEMI August 2017 -Currently asymptomatic -Continue Lipitor, Lopressor, amiodarone, eliquis  Hypertension -Continue metoprolol.  Chronic diastolic heart failure -Compensated -Continue aspirin, Lipitor, Lopressor.  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  Hyperlipidemia -Continue statin.  Deconditioning - PTOT evaluation recommended skilled nursing facility  Code Status: full  DVT Prophylaxis:  eliquis  Family Communication: Discussed in detail with the patient, all imaging results, lab results explained to the patient  Disposition Plan:  snf   Time Spent in minutes  25 minutes  Procedures:   CT abdomen and pelvis 08/05/2016  X-ray of the left foot 08/13/2016  Chest x-ray 08/05/2016, 08/08/2016  2-D echo 08/13/2016, 08/10/2016, 08/08/2016, 08/06/2016  Cardiac catheterization/pericardiocentesis 08/06/2016--- per Dr. Burt Knack  Bilateral lower extremity Dopplers 08/05/2016  Diagnostic laparoscopy, reduction of strangulated small bowel from  left inguinal hernia,TAPP repair with phasix mesh, small bowel resection per Dr. Windle Guard 08/05/2016  Consultants:    Cardiology  [facility] Admitted to [facility] (General Surgery) for incarcerated left inguinal hernia  Antimicrobials:   1 dose of Ancef on 12/21   Medications  Scheduled Meds: . amiodarone  400 mg Oral Q8H  . apixaban  5 mg Oral BID  . atorvastatin  40 mg Oral Daily  . colchicine  0.6 mg Oral Daily  . digoxin  0.25 mg Oral Once  . feeding supplement (ENSURE ENLIVE)  237 mL Oral BID BM  . metoprolol  100 mg Oral BID  . mirtazapine  7.5 mg Oral QHS  . multivitamin with minerals  1 tablet Oral Daily  . pantoprazole  40 mg Oral BID  . potassium chloride  40 mEq Oral Once   Continuous Infusions: PRN Meds:.acetaminophen **OR** acetaminophen, diphenhydrAMINE **OR** [DISCONTINUED] diphenhydrAMINE, fentaNYL (SUBLIMAZE) injection, hydrALAZINE, HYDROmorphone (DILAUDID) injection, metoprolol, ondansetron **OR** ondansetron (ZOFRAN) IV, oxyCODONE   Antibiotics   Anti-infectives    Start     Dose/Rate Route Frequency Ordered Stop   08/06/16 0600  ceFAZolin (ANCEF) IVPB 2g/100 mL premix  Status:  Discontinued     2 g 200 mL/hr over 30 Minutes Intravenous On call to O.R. 08/05/16 1219 08/05/16 1815   08/06/16 0600  metroNIDAZOLE (FLAGYL) IVPB 500 mg     500 mg 100 mL/hr over 60 Minutes Intravenous On call to O.R. 08/05/16 1417 08/05/16 1456   08/05/16 1445  ceFAZolin (ANCEF) IVPB 2g/100 mL premix     2 g 200 mL/hr over 30 Minutes Intravenous On call to O.R. 08/05/16 1417 08/05/16 1444        Subjective:   Marlyn Corporal was seen and examined today.  Not eating too well, heart rate still not well controlled.  Patient denies dizziness, chest pain, shortness of breath, abdominal pain, N/V/D/C, new weakness, numbess, tingling. No acute events overnight.    Objective:   Vitals:   08/18/16 0353 08/18/16 1510 08/18/16 2118 08/19/16 0523  BP: 102/77 96/68 (!) 86/69 117/74  Pulse:  87 84 (!) 108 (!) 108  Resp: (!) 25 (!) 22 20 (!) 23  Temp: 97.9 F (36.6 C) 97.9 F (36.6 C) 97.9 F (36.6 C) 98.4 F (36.9 C)  TempSrc: Oral Oral Oral Oral  SpO2: 98% 97% 97% 100%  Weight: 67.8 kg (149 lb 6.4 oz)   68.8 kg (151 lb 11.2 oz)  Height:        Intake/Output Summary (Last 24 hours) at 08/19/16 1231 Last data filed at 08/19/16 0908  Gross per 24 hour  Intake              440 ml  Output              350 ml  Net               90 ml     Wt Readings from Last 3 Encounters:  08/19/16 68.8 kg (151 lb 11.2 oz)  07/20/16 78.6 kg (173 lb 3.2 oz)  04/14/16 78.3 kg (172 lb 9.6 oz)     Exam  General: Alert and oriented x 3, NAD  HEENT:  Neck:   Cardiovascular: S1 S2 auscultated, tachycardia   Respiratory: Clear to auscultation bilaterally, no wheezing, rales or rhonchi  Gastrointestinal: Soft, nontender, nondistended, + bowel sounds  Ext: no cyanosis clubbing or edema  Neuro:No new deficits  Skin: No rashes  Psych: Normal affect and demeanor, alert and oriented x3    Data Reviewed:  I have personally reviewed following labs and imaging studies  Micro Results Recent Results (from the past 240 hour(s))  Culture, Urine     Status: Abnormal   Collection Time: 08/16/16  1:58 PM  Result Value Ref Range Status   Specimen Description URINE, RANDOM  Final   Special Requests NONE  Final   Culture MULTIPLE SPECIES PRESENT, SUGGEST RECOLLECTION (A)  Final   Report Status 08/17/2016 FINAL  Final    Radiology Reports Ct Abdomen Pelvis W Contrast  Result Date: 08/05/2016 CLINICAL DATA:  Upper abdominal pain and nausea EXAM: CT ABDOMEN AND PELVIS WITH CONTRAST TECHNIQUE: Multidetector CT imaging of the abdomen and pelvis was performed using the standard protocol following bolus administration of intravenous contrast. CONTRAST:  196mL ISOVUE-300 IOPAMIDOL (ISOVUE-300) INJECTION 61% COMPARISON:  03/30/2016. FINDINGS: Lower chest: Lung bases demonstrate some mild  dependent atelectatic changes. A large pericardial effusion is identified which is increased in the interval from the prior MRI of 03/30/2016. It measures approximately 3.7 cm along the posterior wall of the left ventricle. Hepatobiliary: Mild early heterogeneity to the liver is noted related to the timing of the contrast bolus. Multiple gallstones are seen without complicating factors. Pancreas: Unremarkable. No pancreatic ductal dilatation or surrounding inflammatory changes. Spleen: Normal in size without focal abnormality. Adrenals/Urinary Tract: Adrenal glands are unremarkable. Kidneys are normal, without renal calculi, focal lesion, or hydronephrosis. Bladder is decompressed. Stomach/Bowel: Scattered diverticular changes noted without evidence of diverticulitis. The appendix is not well visualized although no inflammatory changes to suggest appendicitis are seen. There is evidence of small bowel dilatation secondary to a left inguinal hernia. A loop of small bowel extends into the hernia and is within normal limits distal to this although obstructed proximally. Vascular/Lymphatic: Aortic atherosclerosis. No enlarged abdominal or pelvic lymph nodes. Reproductive: Prostate appears to have been surgically removed. Other: No abdominal wall hernia or abnormality. No abdominopelvic ascites. Musculoskeletal: No acute or significant osseous findings. IMPRESSION: Left inguinal hernia containing a loop of small bowel with evidence of incarceration and proximal obstruction. Enlarging pericardial effusion as described above. Cholelithiasis without complicating factors. Diverticulosis without diverticulitis. Electronically Signed   By: Inez Catalina M.D.   On: 08/05/2016 09:29   Dg Chest Port 1 View  Result Date: 08/08/2016 CLINICAL DATA:  Status post removal of pericardial drain. EXAM: PORTABLE CHEST 1 VIEW COMPARISON:  Single-view of the chest 08/05/2016. Scratch the single view of the chest 08/05/2016 and  03/26/2016. FINDINGS: Cardiopericardial silhouette appears enlarged but smaller than on the most recent exam. Left pleural effusion and basilar airspace disease are noted. The right lung appears clear. No pneumothorax. Aortic atherosclerosis is noted. IMPRESSION: Left pleural effusion and basilar airspace disease, likely atelectasis. Enlarged cardiopericardial silhouette appears smaller than on the most recent exam. Atherosclerosis. Electronically Signed   By: Inge Rise M.D.   On: 08/08/2016 11:28   Dg Chest Port 1 View  Result Date: 08/05/2016 CLINICAL DATA:  Peripheral effusion, shortness of breath EXAM: PORTABLE CHEST 1 VIEW COMPARISON:  CT chest 03/27/2016 FINDINGS: There is no focal parenchymal opacity. There is no pleural effusion or pneumothorax. The cardiac silhouette is enlarged likely reflecting a pericardial effusion  given the prior exams. The osseous structures are unremarkable. IMPRESSION: 1. Enlarged cardiac silhouette likely reflecting a pericardial effusion. Electronically Signed   By: Kathreen Devoid   On: 08/05/2016 13:43   Dg Foot Complete Left  Result Date: 08/13/2016 CLINICAL DATA:  Pain all over left foot.  No known injury. EXAM: LEFT FOOT - COMPLETE 3+ VIEW COMPARISON:  None. FINDINGS: Hallux valgus deformity. Small plantar calcaneal spur. No acute bony abnormality. Specifically, no fracture, subluxation, or dislocation. Soft tissues are intact. IMPRESSION: No acute bony abnormality.  Hallux valgus. Electronically Signed   By: Rolm Baptise M.D.   On: 08/13/2016 09:54    Lab Data:  CBC:  Recent Labs Lab 08/14/16 0535 08/15/16 0612 08/16/16 0243 08/17/16 0708 08/18/16 0559  WBC 15.4* 13.9* 12.8* 10.4 9.3  NEUTROABS  --   --   --  8.2*  --   HGB 9.4* 9.6* 9.5* 11.1* 10.0*  HCT 27.0* 29.2* 28.1* 34.7* 30.6*  MCV 82.6 86.4 84.4 88.1 87.7  PLT 300 340 374 374 [id number]   Basic Metabolic Panel:  Recent Labs Lab 08/14/16 0535 08/15/16 0612 08/16/16 0243  08/17/16 0708 08/18/16 0559  NA 137 138 140 140 140  K 4.5 3.4* 3.7 4.0 3.5  CL 103 101 104 106 106  CO2 26 27 26 24 23   GLUCOSE 112* 91 98 71 98  BUN 17 17 19 16 15   CREATININE 0.88 0.98 1.08 1.08 1.15  CALCIUM 8.2* 8.3* 8.4* 8.3* 8.4*  MG 2.1 2.2  --   --   --    GFR: Estimated Creatinine Clearance: 46.5 mL/min (by C-G formula based on SCr of 1.15 mg/dL). Liver Function Tests: No results for input(s): AST, ALT, ALKPHOS, BILITOT, PROT, ALBUMIN in the last 168 hours. No results for input(s): LIPASE, AMYLASE in the last 168 hours. No results for input(s): AMMONIA in the last 168 hours. Coagulation Profile: No results for input(s): INR, PROTIME in the last 168 hours. Cardiac Enzymes: No results for input(s): CKTOTAL, CKMB, CKMBINDEX, TROPONINI in the last 168 hours. BNP (last 3 results) No results for input(s): PROBNP in the last 8760 hours. HbA1C: No results for input(s): HGBA1C in the last 72 hours. CBG: No results for input(s): GLUCAP in the last 168 hours. Lipid Profile: No results for input(s): CHOL, HDL, LDLCALC, TRIG, CHOLHDL, LDLDIRECT in the last 72 hours. Thyroid Function Tests: No results for input(s): TSH, T4TOTAL, FREET4, T3FREE, THYROIDAB in the last 72 hours. Anemia Panel: No results for input(s): VITAMINB12, FOLATE, FERRITIN, TIBC, IRON, RETICCTPCT in the last 72 hours. Urine analysis:    Component Value Date/Time   COLORURINE AMBER (A) 08/16/2016 1358   APPEARANCEUR HAZY (A) 08/16/2016 1358   LABSPEC 1.019 08/16/2016 1358   PHURINE 6.0 08/16/2016 1358   GLUCOSEU NEGATIVE 08/16/2016 1358   HGBUR NEGATIVE 08/16/2016 1358   BILIRUBINUR NEGATIVE 08/16/2016 1358   KETONESUR NEGATIVE 08/16/2016 1358   PROTEINUR NEGATIVE 08/16/2016 1358   UROBILINOGEN 4.0 (H) 01/30/2014 1402   NITRITE NEGATIVE 08/16/2016 1358   LEUKOCYTESUR NEGATIVE 08/16/2016 [place] M.D. [facility] 08/19/2016, 12:31 PM  Pager: [phone] Between 7am to 7pm - call  Pager - [phone]  After 7pm go to [email or web address] - password [email or web address]  Call night coverage person covering after 7pm

## 2016-08-19 NOTE — Clinical Social Work Placement (Signed)
   CLINICAL SOCIAL WORK PLACEMENT  NOTE  Date:  08/19/2016  Patient Details  Name: Gregory [name] MRN: [id number] Date of Birth: December 06, 1929  Clinical Social Work is seeking post-discharge placement for this patient at the [place] level of care (*CSW will initial, date and re-position this form in  chart as items are completed):  Yes   Patient/family provided with [facility] Work Department's list of facilities offering this level of care within the geographic area requested by the patient (or if unable, by the patient's family).  Yes   Patient/family informed of their freedom to choose among providers that offer the needed level of care, that participate in Medicare, Medicaid or managed care program needed by the patient, have an available bed and are willing to accept the patient.  Yes   Patient/family informed of [facility]'s ownership interest in [facility] and [facility], as well as of the fact that they are under no obligation to receive care at these facilities.  PASRR submitted to EDS on 08/12/16     PASRR number received on 08/12/16     Existing PASRR number confirmed on       FL2 transmitted to all facilities in geographic area requested by pt/family on 08/12/16     FL2 transmitted to all facilities within larger geographic area on       Patient informed that his/her managed care company has contracts with or will negotiate with certain facilities, including the following:        Yes   Patient/family informed of bed offers received.  Patient chooses bed at [facility] recommends and patient chooses bed at      Patient to be transferred to [facility] on 08/21/16.  Patient to be transferred to facility by [facility]     Patient family notified on   of transfer.  Name of family member notified:        PHYSICIAN Please prepare prescriptions, Please prepare priority discharge summary,  including medications     Additional Comment:    _______________________________________________ Alla German, LCSW 08/19/2016, 2:30 PM

## 2016-08-20 ENCOUNTER — Other Ambulatory Visit (HOSPITAL_COMMUNITY): Payer: Medicare Other

## 2016-08-20 ENCOUNTER — Encounter (HOSPITAL_COMMUNITY): Admission: EM | Disposition: A | Discharge status: Skilled Nursing Facility | Payer: Self-pay | Source: Home / Self Care

## 2016-08-20 DIAGNOSIS — D62 Acute posthemorrhagic anemia: Secondary | ICD-10-CM

## 2016-08-20 DIAGNOSIS — E440 Moderate protein-calorie malnutrition: Secondary | ICD-10-CM

## 2016-08-20 LAB — BASIC METABOLIC PANEL
ANION GAP: 9 (ref 5–15)
BUN: 13 mg/dL (ref 6–20)
CHLORIDE: 106 mmol/L (ref 101–111)
CO2: 25 mmol/L (ref 22–32)
Calcium: 8.2 mg/dL — ABNORMAL LOW (ref 8.9–10.3)
Creatinine, Ser: 1.17 mg/dL (ref 0.61–1.24)
GFR calc Af Amer: >60 mL/min (ref >60)
GFR, EST NON AFRICAN AMERICAN: 55 mL/min — AB (ref >60)
GLUCOSE: 82 mg/dL (ref 65–99)
POTASSIUM: 4.2 mmol/L (ref 3.5–5.1)
Sodium: 140 mmol/L (ref 135–145)

## 2016-08-20 SURGERY — CARDIOVERSION
Anesthesia: Moderate Sedation

## 2016-08-20 MED ORDER — SODIUM CHLORIDE 0.9 % IV SOLN: INTRAVENOUS | Status: DC

## 2016-08-20 MED ORDER — AMIODARONE HCL 200 MG PO TABS
400.0000 mg | ORAL_TABLET | Freq: Every day | ORAL | Status: DC
  Administered 2016-08-21 – 2016-08-22 (×2): 400 mg via ORAL
  Filled 2016-08-20 (×2): qty 2

## 2016-08-20 NOTE — Progress Notes (Signed)
Physical Therapy Treatment Patient Details Name: Gregory [name] MRN: [id number] DOB: [birth year] Today's Date: 08/20/2016    History of Present Illness Pt is a 83 year old male with a history of HTN, nonhemorrhagic CVA, GI bleed, STEMI 03/2016, CHF, A. Fib on Eliquis with abdominal pain.  Pt s/p  Diagnostic laparoscopy, reduction of strangulated small bowel from left inguinal hernia, TAPP repair with phasix mesh, small bowel resection.   Also complication of tamponade symptoms, s/p pericardiocentesis.    PT Comments    Patient progressing slowly towards PT goals. Continues to report weakness and not having an appetite. Convinced pt to sit chair and try to eat some lunch. Requires Min A to stand from EOB and for SPT to chair. Ambulation deferred secondary to soft BP and pt wanting to eat. Bp 90/67 EOB. Plans to d/c to SNF tomorrow. Will follow.   Follow Up Recommendations  SNF     Equipment Recommendations  Rolling walker with 5" wheels    Recommendations for Other Services       Precautions / Restrictions Precautions Precautions: Fall Precaution Comments: Abdominal incision and HR <150; keep SBP >90 Restrictions Weight Bearing Restrictions: No    Mobility  Bed Mobility Overal bed mobility: Needs Assistance Bed Mobility: Supine to Sit     Supine to sit: Min assist;HOB elevated     General bed mobility comments: Assist to elevate trunk to get to EOB with some difficulty today. Use of rail.   Transfers Overall transfer level: Needs assistance Equipment used: Rolling walker (2 wheeled) Transfers: Sit to/from Stand Sit to Stand: Min assist;From elevated surface Stand pivot transfers: Min assist       General transfer comment: Assist to boost to standing with cues for hand placement/technique. SPT to chair with Min A for balance.   Ambulation/Gait Ambulation/Gait assistance:  (Deferred secondary to soft BP and pt wanting to eat lunch)               Stairs             Wheelchair Mobility    Modified Rankin (Stroke Patients Only)       Balance Overall balance assessment: Needs assistance Sitting-balance support: Feet supported;No upper extremity supported Sitting balance-Leahy Scale: Fair     Standing balance support: During functional activity;Bilateral upper extremity supported Standing balance-Leahy Scale: Poor Standing balance comment: min assist for standing balance                    Cognition Arousal/Alertness: Awake/alert Behavior During Therapy: WFL for tasks assessed/performed Overall Cognitive Status: Within Functional Limits for tasks assessed                      Exercises General Exercises - Lower Extremity Ankle Circles/Pumps: Both;10 reps;Supine Long Arc Quad: Both;10 reps;Seated Hip ABduction/ADduction: Both;10 reps;Seated    General Comments        Pertinent Vitals/Pain Pain Assessment: No/denies pain    Home Living                      Prior Function            PT Goals (current goals can now be found in the care plan section) Progress towards PT goals: Progressing toward goals    Frequency    Min 3X/week      PT Plan Current plan remains appropriate    Co-evaluation  End of Session Equipment Utilized During Treatment: Gait belt Activity Tolerance: Patient tolerated treatment well (soft BP) Patient left: in chair;with call bell/phone within reach;with chair alarm set     Time: 1350-1408 PT Time Calculation (min) (ACUTE ONLY): 18 min  Charges:  $Therapeutic Activity: 8-22 mins                    G Codes:      Adrena Nakamura A Lilu Mcglown 08/20/2016, 2:33 PM Wray Kearns, [place], DPT [phone]

## 2016-08-20 NOTE — Consult Note (Signed)
   [facility] CM Inpatient Consult   08/20/2016  Gregory [name] [birth year] [id number]  Patient on the [place] with [facility] with Dr. Jeanann Lewandowsky listed as his primary care provider.  Chart review reveals the Pt is a 83 year old male with a history of HTN, nonhemorrhagic CVA, GI bleed, STEMI 03/2016, HF, A. Fib on Eliquis with abdominal pain.  Pt s/p  Diagnostic laparoscopy, reduction of strangulated small bowel from left inguinal hernia, TAPP repair with phasix mesh, small bowel resection.   Also complication of tamponade symptoms, s/p pericardiocentesis.  The patient is being recommended for a skilled nursing facility for rehab as he needs 24 hour supervision at this time. No current community [facility] Care Management needs noted, as the patient will likely discharge to SNF.  Came by to see the patient and currently receiving nursing care.  Met with the patient at the bedside.  Patient states he was driving and managing prior to his surgery but now is very weak and has no appetite.  He states he is planning to go to rehab to regain his strength. He states he was driving prior to admission. He states he has friend and family support.  For questions, please contact:  Natividad Brood, RN BSN [facility] Liaison  [phone] business mobile phone Toll free office [phone]

## 2016-08-20 NOTE — Progress Notes (Addendum)
I notified MD Rai that the client has not voided since this morning. He was npo for breakfast. Intake has been 120 ml with his daily medications and 120 ml for lunch. Just had the client drink 240 ml of water and is working on a strawberry ensure (240 ml). A bladder scan showed 269 ml. Order to paged MD when client voids and encourage fluids. I will continue to monitor the client closely.   Saddie Benders RN  Notified MD Rai with a void of 250 ml.

## 2016-08-20 NOTE — Care Management Important Message (Signed)
Important Message  Patient Details  Name: Gregory [name] MRN: [id number] Date of Birth: Aug 24, 1929   Medicare Important Message Given:  Yes    Burlin Mcnair Abena 08/20/2016, 1:45 PM

## 2016-08-20 NOTE — Progress Notes (Signed)
[facility]                                                                              Patient Demographics  Gregory [name], is a 83 y.o. male, DOB - July 20, 1930, HC:[id number]  Admit date - 08/05/2016   Admitting Physician Md Edison Pace, MD  Outpatient Primary MD for the patient is [name], MD  Outpatient specialists:   LOS - 15  days    Chief Complaint  Patient presents with  . Abdominal Pain       Brief summary   Patient is a 83 year old gentleman history of hypertension, nonhemorrhagic CVA, history of GI bleed, coronary artery disease status post STEMI August 2017, CHF, atrial fibrillation on anticoagulation who presented to the ED with complaints of progressively worsening low abdominal pain nausea vomiting. Patient noted to have an incarcerated left inguinal hernia and underwent emergent diagnostic laparoscopy with reduction of strangulated small bowel from left inguinal hernia,TAPP repair with phasix mesh, small bowel resection per Dr. Windle Guard 08/05/2016. Patient was assessed by cardiology preoperatively for clearance a repeat 2-D echo was done postoperatively to assess for pericardial effusion and patient noted to have a large per cardiology effusion with evidence of tamponade physiology including hypotension in the setting as SVT. Patient subsequently underwent pericardiocentesis per Dr. Burt Knack 08/06/2016. Patient also noted to be in A. fib with RVR and currently on amiodarone and beta blocker. Anticoagulation on hold secondary to concern for hemorrhagic compression would recent pericardiocentesis. Cardiology following. Patient also noted to have melanotic stools 08/13/2016 as well as 08/14/2016. H&H stable. General surgery following. [facility] were asked to take over patient's care 08/13/2016.    Assessment & Plan    Incarcerated left inguinal hernia - Status post Laparoscopic repair of incarcerated inguinal hernia with small bowel resection per  Dr. Kae Heller 08/05/2016 - Previous melanotic stools may be secondary from anastomosis site - H/H improved stable - Management per Gen Surg   Melanotic stools/postop acute blood loss anemia - H&H currently stable, BM 1/2 nonbloody  - Continue PPI   Pericardial effusion/tamponade -Patient initially had tamponade features requiring pericardiocentesis per Dr. Burt Knack 08/06/2016, Drain removed on  08/08/2016 - 2-D echo from 08/13/2016 with moderate-sized pleural effusion without tamponade  Atrial fibrillation with RVR - Continues to have elevated heart rate in 120s despite amiodarone high dose 400 mg TID and metoprolol 100mg  BID - Patient started on Eliquis on 1/2 by cardiology.  - Rate controlled today, cardioversion canceled.   Coronary artery disease status post STEMI August 2017 -Currently asymptomatic -Continue Lipitor, Lopressor, amiodarone, eliquis  Hypertension -Continue metoprolol.  Chronic diastolic heart failure -Compensated -Continue aspirin, Lipitor, Lopressor.  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  Hyperlipidemia -Continue statin.  Deconditioning - PTOT evaluation recommended skilled nursing facility  Code Status: full  DVT Prophylaxis:  eliquis  Family Communication: Discussed in detail with the patient, all imaging results, lab results explained to the patient  Disposition Plan:  snf hopefully tomorrow if rate remains controlled  Time Spent in minutes  25 minutes  Procedures:  CT abdomen and pelvis 08/05/2016  X-ray of the left foot 08/13/2016  Chest x-ray 08/05/2016, 08/08/2016  2-D echo 08/13/2016, 08/10/2016, 08/08/2016, 08/06/2016  Cardiac catheterization/pericardiocentesis 08/06/2016--- per Dr. Burt Knack  Bilateral lower extremity Dopplers 08/05/2016  Diagnostic laparoscopy, reduction of strangulated small  bowel from left inguinal hernia,TAPP repair with phasix mesh, small bowel resection per Dr. Windle Guard 08/05/2016  Consultants:    Cardiology  [place] Admitted to [facility] (General Surgery) for incarcerated left inguinal hernia  Antimicrobials:   1 dose of Ancef on 12/21   Medications  Scheduled Meds: . [START ON 08/21/2016] amiodarone  400 mg Oral Daily  . apixaban  5 mg Oral BID  . atorvastatin  40 mg Oral Daily  . colchicine  0.6 mg Oral Daily  . feeding supplement (ENSURE ENLIVE)  237 mL Oral BID BM  . metoprolol  100 mg Oral BID  . mirtazapine  7.5 mg Oral QHS  . multivitamin with minerals  1 tablet Oral Daily  . pantoprazole  40 mg Oral BID   Continuous Infusions: . sodium chloride     PRN Meds:.acetaminophen **OR** acetaminophen, diphenhydrAMINE **OR** [DISCONTINUED] diphenhydrAMINE, fentaNYL (SUBLIMAZE) injection, hydrALAZINE, HYDROmorphone (DILAUDID) injection, metoprolol, ondansetron **OR** ondansetron (ZOFRAN) IV, oxyCODONE   Antibiotics   Anti-infectives    Start     Dose/Rate Route Frequency Ordered Stop   08/06/16 0600  ceFAZolin (ANCEF) IVPB 2g/100 mL premix  Status:  Discontinued     2 g 200 mL/hr over 30 Minutes Intravenous On call to O.R. 08/05/16 1219 08/05/16 1815   08/06/16 0600  metroNIDAZOLE (FLAGYL) IVPB 500 mg     500 mg 100 mL/hr over 60 Minutes Intravenous On call to O.R. 08/05/16 1417 08/05/16 1456   08/05/16 1445  ceFAZolin (ANCEF) IVPB 2g/100 mL premix     2 g 200 mL/hr over 30 Minutes Intravenous On call to O.R. 08/05/16 1417 08/05/16 1444        Subjective:   Gregory [name] was seen and examined today.  Heart rate well controlled this morning. Patient denies dizziness, chest pain, shortness of breath, abdominal pain, N/V/D/C, new weakness, numbess, tingling. No acute events overnight.    Objective:   Vitals:   08/19/16 2015 08/19/16 2334 08/20/16 0405 08/20/16 1012  BP: 114/75 106/76 104/85 111/75  Pulse:  86 80 85  Resp: (!) 31  19   Temp:  97.9 F (36.6 C)  98 F (36.7 C)   TempSrc: Oral  Oral   SpO2: 100%  100%   Weight:   68.8 kg (151 lb 9.6 oz)   Height:        Intake/Output Summary (Last 24 hours) at 08/20/16 1234 Last data filed at 08/20/16 0700  Gross per 24 hour  Intake              367 ml  Output              600 ml  Net             -233 ml     Wt Readings from Last 3 Encounters:  08/20/16 68.8 kg (151 lb 9.6 oz)  07/20/16 78.6 kg (173 lb 3.2 oz)  04/14/16 78.3 kg (172 lb 9.6 oz)     Exam  General: Alert and oriented x 3, NAD  HEENT:    Neck:   Cardiovascular: S1 S2 auscultated, no mrg  Respiratory: CTAB  Gastrointestinal: Soft, nontender, nondistended, + bowel sounds  Ext: no cyanosis clubbing or edema  Neuro:No new deficits  Skin: No rashes  Psych: Normal affect and demeanor, alert and oriented x3    Data Reviewed:  I have personally reviewed following labs and imaging studies  Micro Results Recent Results (from the past 240 hour(s))  Culture, Urine     Status: Abnormal   Collection Time: 08/16/16  1:58 PM  Result Value Ref Range Status   Specimen Description URINE, RANDOM  Final   Special Requests NONE  Final   Culture MULTIPLE SPECIES PRESENT, SUGGEST RECOLLECTION (A)  Final   Report Status 08/17/2016 FINAL  Final    Radiology Reports Ct Abdomen Pelvis W Contrast  Result Date: 08/05/2016 CLINICAL DATA:  Upper abdominal pain and nausea EXAM: CT ABDOMEN AND PELVIS WITH CONTRAST TECHNIQUE: Multidetector CT imaging of the abdomen and pelvis was performed using the standard protocol following bolus administration of intravenous contrast. CONTRAST:  165mL ISOVUE-300 IOPAMIDOL (ISOVUE-300) INJECTION 61% COMPARISON:  03/30/2016. FINDINGS: Lower chest: Lung bases demonstrate some mild dependent atelectatic changes. A large pericardial effusion is identified which is increased in the interval from the prior MRI of 03/30/2016. It measures approximately 3.7 cm along the posterior wall of  the left ventricle. Hepatobiliary: Mild early heterogeneity to the liver is noted related to the timing of the contrast bolus. Multiple gallstones are seen without complicating factors. Pancreas: Unremarkable. No pancreatic ductal dilatation or surrounding inflammatory changes. Spleen: Normal in size without focal abnormality. Adrenals/Urinary Tract: Adrenal glands are unremarkable. Kidneys are normal, without renal calculi, focal lesion, or hydronephrosis. Bladder is decompressed. Stomach/Bowel: Scattered diverticular changes noted without evidence of diverticulitis. The appendix is not well visualized although no inflammatory changes to suggest appendicitis are seen. There is evidence of small bowel dilatation secondary to a left inguinal hernia. A loop of small bowel extends into the hernia and is within normal limits distal to this although obstructed proximally. Vascular/Lymphatic: Aortic atherosclerosis. No enlarged abdominal or pelvic lymph nodes. Reproductive: Prostate appears to have been surgically removed. Other: No abdominal wall hernia or abnormality. No abdominopelvic ascites. Musculoskeletal: No acute or significant osseous findings. IMPRESSION: Left inguinal hernia containing a loop of small bowel with evidence of incarceration and proximal obstruction. Enlarging pericardial effusion as described above. Cholelithiasis without complicating factors. Diverticulosis without diverticulitis. Electronically Signed   By: Inez Catalina M.D.   On: 08/05/2016 09:29   Dg Chest Port 1 View  Result Date: 08/08/2016 CLINICAL DATA:  Status post removal of pericardial drain. EXAM: PORTABLE CHEST 1 VIEW COMPARISON:  Single-view of the chest 08/05/2016. Scratch the single view of the chest 08/05/2016 and 03/26/2016. FINDINGS: Cardiopericardial silhouette appears enlarged but smaller than on the most recent exam. Left pleural effusion and basilar airspace disease are noted. The right lung appears clear. No  pneumothorax. Aortic atherosclerosis is noted. IMPRESSION: Left pleural effusion and basilar airspace disease, likely atelectasis. Enlarged cardiopericardial silhouette appears smaller than on the most recent exam. Atherosclerosis. Electronically Signed   By: Inge Rise M.D.   On: 08/08/2016 11:28   Dg Chest Port 1 View  Result Date: 08/05/2016 CLINICAL DATA:  Peripheral effusion, shortness of breath EXAM: PORTABLE CHEST 1 VIEW COMPARISON:  CT chest 03/27/2016 FINDINGS: There is no focal parenchymal opacity. There is no pleural effusion or pneumothorax. The cardiac silhouette is enlarged likely reflecting a pericardial effusion given the prior exams. The osseous structures are unremarkable. IMPRESSION: 1. Enlarged cardiac silhouette likely reflecting a pericardial effusion. Electronically Signed   By: Kathreen Devoid   On: 08/05/2016 13:43   Dg Foot Complete Left  Result  Date: 08/13/2016 CLINICAL DATA:  Pain all over left foot.  No known injury. EXAM: LEFT FOOT - COMPLETE 3+ VIEW COMPARISON:  None. FINDINGS: Hallux valgus deformity. Small plantar calcaneal spur. No acute bony abnormality. Specifically, no fracture, subluxation, or dislocation. Soft tissues are intact. IMPRESSION: No acute bony abnormality.  Hallux valgus. Electronically Signed   By: Rolm Baptise M.D.   On: 08/13/2016 09:54    Lab Data:  CBC:  Recent Labs Lab 08/14/16 0535 08/15/16 0612 08/16/16 0243 08/17/16 0708 08/18/16 0559  WBC 15.4* 13.9* 12.8* 10.4 9.3  NEUTROABS  --   --   --  8.2*  --   HGB 9.4* 9.6* 9.5* 11.1* 10.0*  HCT 27.0* 29.2* 28.1* 34.7* 30.6*  MCV 82.6 86.4 84.4 88.1 87.7  PLT 300 340 374 374 [id number]   Basic Metabolic Panel:  Recent Labs Lab 08/14/16 0535 08/15/16 0612 08/16/16 0243 08/17/16 0708 08/18/16 0559 08/20/16 0515  NA 137 138 140 140 140 140  K 4.5 3.4* 3.7 4.0 3.5 4.2  CL 103 101 104 106 106 106  CO2 26 27 26 24 23 25   GLUCOSE 112* 91 98 71 98 82  BUN 17 17 19 16 15 13     CREATININE 0.88 0.98 1.08 1.08 1.15 1.17  CALCIUM 8.2* 8.3* 8.4* 8.3* 8.4* 8.2*  MG 2.1 2.2  --   --   --   --    GFR: Estimated Creatinine Clearance: 45.7 mL/min (by C-G formula based on SCr of 1.17 mg/dL). Liver Function Tests: No results for input(s): AST, ALT, ALKPHOS, BILITOT, PROT, ALBUMIN in the last 168 hours. No results for input(s): LIPASE, AMYLASE in the last 168 hours. No results for input(s): AMMONIA in the last 168 hours. Coagulation Profile: No results for input(s): INR, PROTIME in the last 168 hours. Cardiac Enzymes: No results for input(s): CKTOTAL, CKMB, CKMBINDEX, TROPONINI in the last 168 hours. BNP (last 3 results) No results for input(s): PROBNP in the last 8760 hours. HbA1C: No results for input(s): HGBA1C in the last 72 hours. CBG: No results for input(s): GLUCAP in the last 168 hours. Lipid Profile: No results for input(s): CHOL, HDL, LDLCALC, TRIG, CHOLHDL, LDLDIRECT in the last 72 hours. Thyroid Function Tests: No results for input(s): TSH, T4TOTAL, FREET4, T3FREE, THYROIDAB in the last 72 hours. Anemia Panel: No results for input(s): VITAMINB12, FOLATE, FERRITIN, TIBC, IRON, RETICCTPCT in the last 72 hours. Urine analysis:    Component Value Date/Time   COLORURINE AMBER (A) 08/16/2016 1358   APPEARANCEUR HAZY (A) 08/16/2016 1358   LABSPEC 1.019 08/16/2016 1358   PHURINE 6.0 08/16/2016 1358   GLUCOSEU NEGATIVE 08/16/2016 1358   HGBUR NEGATIVE 08/16/2016 1358   BILIRUBINUR NEGATIVE 08/16/2016 1358   KETONESUR NEGATIVE 08/16/2016 1358   PROTEINUR NEGATIVE 08/16/2016 1358   UROBILINOGEN 4.0 (H) 01/30/2014 1402   NITRITE NEGATIVE 08/16/2016 1358   LEUKOCYTESUR NEGATIVE 08/16/2016 Mertzon M.D. [facility] 08/20/2016, 12:34 PM  Pager: [phone] Between 7am to 7pm - call Pager - [phone]  After 7pm go to [email or web address] - password [email or web address]  Call night coverage person covering after 7pm

## 2016-08-20 NOTE — Progress Notes (Signed)
Patient Name: Gregory [name] Date of Encounter: 08/20/2016  Primary Cardiologist: [facility] Problem List     Principal Problem:   Incarcerated left inguinal hernia Active Problems:   Hypertension   Hyperlipidemia   PAF (paroxysmal atrial fibrillation) (HCC)   CAD (coronary artery disease)   Chronic diastolic congestive heart failure (HCC)   Incarcerated inguinal hernia   Preop cardiovascular exam   Malnutrition of moderate degree   Hypotension   Cardiac tamponade   Pericardial effusion   Hypokalemia   Melanotic stools   Postoperative anemia due to acute blood loss   Persistent atrial fibrillation (HCC)   Anticoagulated   Difficulty in walking, not elsewhere classified   Encounter for central line placement     Subjective   Feels well, denies chest pain and SOB.   Inpatient Medications    Scheduled Meds: . amiodarone  400 mg Oral Q8H  . apixaban  5 mg Oral BID  . atorvastatin  40 mg Oral Daily  . colchicine  0.6 mg Oral Daily  . feeding supplement (ENSURE ENLIVE)  237 mL Oral BID BM  . metoprolol  100 mg Oral BID  . mirtazapine  7.5 mg Oral QHS  . multivitamin with minerals  1 tablet Oral Daily  . pantoprazole  40 mg Oral BID   Continuous Infusions: . sodium chloride     PRN Meds: acetaminophen **OR** acetaminophen, diphenhydrAMINE **OR** [DISCONTINUED] diphenhydrAMINE, fentaNYL (SUBLIMAZE) injection, hydrALAZINE, HYDROmorphone (DILAUDID) injection, metoprolol, ondansetron **OR** ondansetron (ZOFRAN) IV, oxyCODONE   Vital Signs    Vitals:   08/19/16 1415 08/19/16 2015 08/19/16 2334 08/20/16 0405  BP: (!) 87/70 114/75 106/76 104/85  Pulse:   86 80  Resp:  (!) 31  19  Temp: 98.5 F (36.9 C) 97.9 F (36.6 C)  98 F (36.7 C)  TempSrc: Oral Oral  Oral  SpO2:  100%  100%  Weight:    151 lb 9.6 oz (68.8 kg)  Height:        Intake/Output Summary (Last 24 hours) at 08/20/16 0924 Last data filed at 08/20/16 0700  Gross per 24 hour  Intake               367 ml  Output              600 ml  Net             -233 ml   Filed Weights   08/18/16 0353 08/19/16 0523 08/20/16 0405  Weight: 149 lb 6.4 oz (67.8 kg) 151 lb 11.2 oz (68.8 kg) 151 lb 9.6 oz (68.8 kg)    Physical Exam   GEN: Ill appearing male in no acute distress.  HEENT: Grossly normal.  Neck: Supple, no JVD, carotid bruits, or masses. Cardiac: irregularly irregular rhythm, no murmurs, rubs, or gallops. No clubbing, cyanosis, edema.  Radials/DP/PT 2+ and equal bilaterally.  Respiratory:  Respirations regular and unlabored, some diminished rhonchi in bilateral lower lobes.  GI: Soft, nontender, nondistended, BS + x 4. MS: no deformity or atrophy. Skin: warm and dry, no rash. Neuro:  Strength and sensation are intact. Psych: AAOx3.  Normal affect.  Labs    CBC  Recent Labs  08/18/16 0559  WBC 9.3  HGB 10.0*  HCT 30.6*  MCV 87.7  PLT [id number]   Basic Metabolic Panel  Recent Labs  08/18/16 0559 08/20/16 0515  NA 140 140  K 3.5 4.2  CL 106 106  CO2 23 25  GLUCOSE 98 82  BUN 15 13  CREATININE 1.15 1.17  CALCIUM 8.4* 8.2*     Telemetry    Afib, rates in the 80's.  - Personally Reviewed  ECG    Afib with RBBB - Personally Reviewed  Radiology    No results found.  Cardiac Studies   Transthoracic Echocardiography 08/13/16 Study Conclusions  - Left ventricle: The cavity size was normal. There was severe concentric hypertrophy. LV mid-cavity obliteration with LV mid-cavity gradient to 22 mmHg peak. Systolic function was normal. The estimated ejection fraction was in the range of 55% to 60%. Indeterminant diastolic function (not assessed). Wall motion was normal; there were no regional wall motion abnormalities. - Aortic valve: There was no stenosis. - Mitral valve: Mildly calcified annulus. There was trivial regurgitation. - Left atrium: The atrium was mildly to moderately dilated. - Right ventricle: The cavity size was normal.  Wall thickness was mildly to moderately increased. Systolic function was mildly to moderately reduced. - Right atrium: The atrium was mildly dilated. - Pulmonary arteries: No complete TR doppler jet so unable to estimate PA systolic pressure. - Systemic veins: IVC not visualized. - Pericardium, extracardiac: Moderate circumferential pericardial effusion, primarily located posteriorly. 2.0 cm thickness, similar to prior. There does not appear to be tamponade present.  Impressions:  - Normal LV size with severe concentric LV hypertrophy, EF 55-60%. Normal RV size with mild to moderate RV hypertrophy and mild to moderate RV systolic dysfunction. Moderate pericardial effusion without tamponade, primarily posterior, similar to prior. Findings suggestive of cardiac amyloidosis.    Patient Profile     Gregory [name] is a 83 year old male with a past medical history of HTN, nonhemorrhagic CVA, GI bleed, STEMI 03/2016, CHF, PAF on Eliquis who presented [facility] the [facility] ED on 12/21/17with complaints of progressively worsening lower abdominal pain; s/p laparoscopic repair of strangulated left inguinal hernia with phasix mesh and short segment small bowel resection.   Found to have a pericardial effusion with tamponade features post abdominal surgery, s/p pericardicentesis. Pericardial drain pulled on 08/08/16. Repeat Echo shows no tamponade. 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   Also developed Afib RVR post surgery, started on Amio and beta blocker  Assessment & Plan    1. Pericardial Effusion: with residual posterior effusion. Will need follow up echo in 30 days.   2. Atrial fibrillation with RVR: HR much better today and overnight. Per Dr. [name], will cancel DCCV today, talked with patient and his son who are both in agreement with the plan of care. Also spoke with Dr. Tana Coast who agreed.   Continue Eliquis.   This patients CHA2DS2-VASc Score and unadjusted  Ischemic Stroke Rate (% per year) is equal to 11.2 % stroke rate/year from a score of 7 Above score calculated as 1 point each if present [CHF, HTN, DM, Vascular=MI/PAD/Aortic Plaque, Age if 65-74, or Male], 2 points each if present [Age > 75, or Stroke/TIA/TE]    3.CAD: s/p STEMI in Aug. 2017, medical therapy was initiated. No angina. Continue aspirin, atorvastatin, and metoprolol.  4. Possible amyloidosis: Echo is concerning for amyloid as was previous MRI. However given age no good options for therapy.   5.Acute on chronic diastolic congestive heart failure: Stable, Appears euvolemic.    Signed, Arbutus Leas, NP  08/20/2016, 9:24 AM    Patient seen and examined. Agree with assessment and plan. AF rate is much better controlled. Pt is loaded with amiodarone; will change to 400 mg daily, continue metoprolol 100 mg bid, received a  dose of dig x1 yesterday. Since he had not been on anticoagulation for 2 weeks while in AF this admission will defer TEE guided cardioversion  Continue eliquis; hopefully he may pharmacologically cardiovert with continued med Rx, but if patient does not pharmacologically cardiovert after several weeks can consider outpatient dc cardioversion. To go to SNF tomorrow; arrange fro f/u cardiology ov with me in 2-3 weeks   Troy Sine, MD, [facility] 08/20/2016 11:49 AM

## 2016-08-21 LAB — BASIC METABOLIC PANEL
ANION GAP: 11 (ref 5–15)
BUN: 17 mg/dL (ref 6–20)
CHLORIDE: 109 mmol/L (ref 101–111)
CO2: 21 mmol/L — AB (ref 22–32)
Calcium: 8.3 mg/dL — ABNORMAL LOW (ref 8.9–10.3)
Creatinine, Ser: 1.17 mg/dL (ref 0.61–1.24)
GFR calc Af Amer: >60 mL/min (ref >60)
GFR, EST NON AFRICAN AMERICAN: 55 mL/min — AB (ref >60)
GLUCOSE: 79 mg/dL (ref 65–99)
POTASSIUM: 3.8 mmol/L (ref 3.5–5.1)
Sodium: 141 mmol/L (ref 135–145)

## 2016-08-21 MED ORDER — SODIUM CHLORIDE 0.9 % IV SOLN
INTRAVENOUS | Status: DC
  Administered 2016-08-21 (×2): via INTRAVENOUS

## 2016-08-21 MED ORDER — MEGESTROL ACETATE 40 MG PO TABS
40.0000 mg | ORAL_TABLET | Freq: Every day | ORAL | Status: DC
  Administered 2016-08-21 – 2016-08-22 (×2): 40 mg via ORAL
  Filled 2016-08-21 (×2): qty 1

## 2016-08-21 MED ORDER — SODIUM CHLORIDE 0.9 % IV BOLUS (SEPSIS)
500.0000 mL | Freq: Once | INTRAVENOUS | Status: AC
  Administered 2016-08-21: 500 mL via INTRAVENOUS

## 2016-08-21 NOTE — Progress Notes (Signed)
[facility]                                                                              Patient Demographics  Gregory [name], is a 83 y.o. male, DOB - 10-18-1929, [id number]  Admit date - 08/05/2016   Admitting Physician No admitting provider for patient encounter.  Outpatient Primary MD for the patient is [name], MD  Outpatient specialists:   LOS - 16  days    Chief Complaint  Patient presents with  . Abdominal Pain       Brief summary   Patient is a 83 year old gentleman history of hypertension, nonhemorrhagic CVA, history of GI bleed, coronary artery disease status post STEMI August 2017, CHF, atrial fibrillation on anticoagulation who presented to the ED with complaints of progressively worsening low abdominal pain nausea vomiting. Patient noted to have an incarcerated left inguinal hernia and underwent emergent diagnostic laparoscopy with reduction of strangulated small bowel from left inguinal hernia,TAPP repair with phasix mesh, small bowel resection per Dr. Windle Guard 08/05/2016. Patient was assessed by cardiology preoperatively for clearance a repeat 2-D echo was done postoperatively to assess for pericardial effusion and patient noted to have a large per cardiology effusion with evidence of tamponade physiology including hypotension in the setting as SVT. Patient subsequently underwent pericardiocentesis per Dr. Burt Knack 08/06/2016. Patient also noted to be in A. fib with RVR and currently on amiodarone and beta blocker. Anticoagulation on hold secondary to concern for hemorrhagic compression would recent pericardiocentesis. Cardiology following. Patient also noted to have melanotic stools 08/13/2016 as well as 08/14/2016. H&H stable. General surgery following. [facility] were asked to take over patient's care 08/13/2016.    Assessment & Plan    Incarcerated left inguinal hernia - Status post Laparoscopic repair of incarcerated inguinal  hernia with small bowel resection per Dr. Kae Heller 08/05/2016 - Previous melanotic stools may be secondary from anastomosis site - H/H improved stable - Management per Gen Surg   Melanotic stools/postop acute blood loss anemia - H&H currently stable, BM 1/2 nonbloody  - Continue PPI   Pericardial effusion/tamponade -Patient initially had tamponade features requiring pericardiocentesis per Dr. Burt Knack 08/06/2016, Drain removed on  08/08/2016 - 2-D echo from 08/13/2016 with moderate-sized pleural effusion without tamponade  Atrial fibrillation with RVR - Continues to have elevated heart rate in 120s despite amiodarone high dose 400 mg TID and metoprolol 100mg  BID - Patient started on Eliquis on 1/2 by cardiology.  - Rate controlled, cardioversion was canceled on 08/20/16  Coronary artery disease status post STEMI August 2017 -Currently asymptomatic -Continue Lipitor, Lopressor, amiodarone, eliquis  Failure to thrive, poor oral intake, oliguria - oliguria in the last 12 hours, patient hardly drinking or eating much - Placed on appetite stimulant, Megace, was given overnight fluid bolus, placed on gentle hydration - Palliative Medicine consult placed for goals of care  Hypertension -Continue metoprolol.  Chronic diastolic heart failure -Compensated -Continue aspirin, Lipitor, Lopressor.  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  Hyperlipidemia -Continue statin.  Deconditioning - PTOT evaluation recommended skilled  nursing facility  Code Status: full  DVT Prophylaxis:  eliquis  Family Communication: Discussed in detail with the patient, all imaging results, lab results explained to the patient  Disposition Plan:    Time Spent in minutes  25 minutes  Procedures:   CT abdomen and pelvis 08/05/2016  X-ray of the left foot  08/13/2016  Chest x-ray 08/05/2016, 08/08/2016  2-D echo 08/13/2016, 08/10/2016, 08/08/2016, 08/06/2016  Cardiac catheterization/pericardiocentesis 08/06/2016--- per Dr. Burt Knack  Bilateral lower extremity Dopplers 08/05/2016  Diagnostic laparoscopy, reduction of strangulated small bowel from left inguinal hernia,TAPP repair with phasix mesh, small bowel resection per Dr. Windle Guard 08/05/2016  Consultants:    Cardiology  [place] Admitted to [facility] (General Surgery) for incarcerated left inguinal hernia  Antimicrobials:   1 dose of Ancef on 12/21   Medications  Scheduled Meds: . amiodarone  400 mg Oral Daily  . apixaban  5 mg Oral BID  . atorvastatin  40 mg Oral Daily  . colchicine  0.6 mg Oral Daily  . feeding supplement (ENSURE ENLIVE)  237 mL Oral BID BM  . megestrol  40 mg Oral Daily  . metoprolol  100 mg Oral BID  . mirtazapine  7.5 mg Oral QHS  . multivitamin with minerals  1 tablet Oral Daily  . pantoprazole  40 mg Oral BID   Continuous Infusions: . sodium chloride    . sodium chloride 75 mL/hr at 08/21/16 0802   PRN Meds:.acetaminophen **OR** acetaminophen, diphenhydrAMINE **OR** [DISCONTINUED] diphenhydrAMINE, fentaNYL (SUBLIMAZE) injection, hydrALAZINE, HYDROmorphone (DILAUDID) injection, metoprolol, ondansetron **OR** ondansetron (ZOFRAN) IV, oxyCODONE   Antibiotics   Anti-infectives    Start     Dose/Rate Route Frequency Ordered Stop   08/06/16 0600  ceFAZolin (ANCEF) IVPB 2g/100 mL premix  Status:  Discontinued     2 g 200 mL/hr over 30 Minutes Intravenous On call to O.R. 08/05/16 1219 08/05/16 1815   08/06/16 0600  metroNIDAZOLE (FLAGYL) IVPB 500 mg     500 mg 100 mL/hr over 60 Minutes Intravenous On call to O.R. 08/05/16 1417 08/05/16 1456   08/05/16 1445  ceFAZolin (ANCEF) IVPB 2g/100 mL premix     2 g 200 mL/hr over 30 Minutes Intravenous On call to O.R. 08/05/16 1417 08/05/16 1444        Subjective:   Gregory [name] was seen and examined today.   Heart rate well controlled. Not drinking or eating enough. Her night RN report, patient had no output in last 12 hours. Received 500 normal saline bolus.  Patient denies dizziness, chest pain, shortness of breath, abdominal pain, N/V/D/C, new weakness, numbess, tingling. No acute events overnight.    Objective:   Vitals:   08/20/16 2143 08/21/16 0433 08/21/16 0800 08/21/16 1015  BP: 101/73 104/73  109/75  Pulse: 90 84  89  Resp:  18 (!) 22   Temp:  97.8 F (36.6 C)    TempSrc:  Oral    SpO2:  99% 100%   Weight:  67.2 kg (148 lb 1.6 oz)    Height:        Intake/Output Summary (Last 24 hours) at 08/21/16 1218 Last data filed at 08/21/16 0800  Gross per 24 hour  Intake              720 ml  Output              300 ml  Net              420 ml     Wt  Readings from Last 3 Encounters:  08/21/16 67.2 kg (148 lb 1.6 oz)  07/20/16 78.6 kg (173 lb 3.2 oz)  04/14/16 78.3 kg (172 lb 9.6 oz)     Exam  General: Alert and oriented x 3, NAD  HEENT:    Neck:   Cardiovascular: S1 S2 auscultated, no mrg  Respiratory: CTAB  Gastrointestinal: Soft, nontender, nondistended, + bowel sounds  Ext: no cyanosis clubbing or edema  Neuro:No new deficits  Skin: No rashes  Psych: Normal affect and demeanor, alert and oriented x3    Data Reviewed:  I have personally reviewed following labs and imaging studies  Micro Results Recent Results (from the past 240 hour(s))  Culture, Urine     Status: Abnormal   Collection Time: 08/16/16  1:58 PM  Result Value Ref Range Status   Specimen Description URINE, RANDOM  Final   Special Requests NONE  Final   Culture MULTIPLE SPECIES PRESENT, SUGGEST RECOLLECTION (A)  Final   Report Status 08/17/2016 FINAL  Final    Radiology Reports Ct Abdomen Pelvis W Contrast  Result Date: 08/05/2016 CLINICAL DATA:  Upper abdominal pain and nausea EXAM: CT ABDOMEN AND PELVIS WITH CONTRAST TECHNIQUE: Multidetector CT imaging of the abdomen and pelvis was  performed using the standard protocol following bolus administration of intravenous contrast. CONTRAST:  160mL ISOVUE-300 IOPAMIDOL (ISOVUE-300) INJECTION 61% COMPARISON:  03/30/2016. FINDINGS: Lower chest: Lung bases demonstrate some mild dependent atelectatic changes. A large pericardial effusion is identified which is increased in the interval from the prior MRI of 03/30/2016. It measures approximately 3.7 cm along the posterior wall of the left ventricle. Hepatobiliary: Mild early heterogeneity to the liver is noted related to the timing of the contrast bolus. Multiple gallstones are seen without complicating factors. Pancreas: Unremarkable. No pancreatic ductal dilatation or surrounding inflammatory changes. Spleen: Normal in size without focal abnormality. Adrenals/Urinary Tract: Adrenal glands are unremarkable. Kidneys are normal, without renal calculi, focal lesion, or hydronephrosis. Bladder is decompressed. Stomach/Bowel: Scattered diverticular changes noted without evidence of diverticulitis. The appendix is not well visualized although no inflammatory changes to suggest appendicitis are seen. There is evidence of small bowel dilatation secondary to a left inguinal hernia. A loop of small bowel extends into the hernia and is within normal limits distal to this although obstructed proximally. Vascular/Lymphatic: Aortic atherosclerosis. No enlarged abdominal or pelvic lymph nodes. Reproductive: Prostate appears to have been surgically removed. Other: No abdominal wall hernia or abnormality. No abdominopelvic ascites. Musculoskeletal: No acute or significant osseous findings. IMPRESSION: Left inguinal hernia containing a loop of small bowel with evidence of incarceration and proximal obstruction. Enlarging pericardial effusion as described above. Cholelithiasis without complicating factors. Diverticulosis without diverticulitis. Electronically Signed   By: Inez Catalina M.D.   On: 08/05/2016 09:29   Dg  Chest Port 1 View  Result Date: 08/08/2016 CLINICAL DATA:  Status post removal of pericardial drain. EXAM: PORTABLE CHEST 1 VIEW COMPARISON:  Single-view of the chest 08/05/2016. Scratch the single view of the chest 08/05/2016 and 03/26/2016. FINDINGS: Cardiopericardial silhouette appears enlarged but smaller than on the most recent exam. Left pleural effusion and basilar airspace disease are noted. The right lung appears clear. No pneumothorax. Aortic atherosclerosis is noted. IMPRESSION: Left pleural effusion and basilar airspace disease, likely atelectasis. Enlarged cardiopericardial silhouette appears smaller than on the most recent exam. Atherosclerosis. Electronically Signed   By: Inge Rise M.D.   On: 08/08/2016 11:28   Dg Chest Port 1 View  Result Date: 08/05/2016 CLINICAL DATA:  Peripheral effusion, shortness of breath EXAM: PORTABLE CHEST 1 VIEW COMPARISON:  CT chest 03/27/2016 FINDINGS: There is no focal parenchymal opacity. There is no pleural effusion or pneumothorax. The cardiac silhouette is enlarged likely reflecting a pericardial effusion given the prior exams. The osseous structures are unremarkable. IMPRESSION: 1. Enlarged cardiac silhouette likely reflecting a pericardial effusion. Electronically Signed   By: Kathreen Devoid   On: 08/05/2016 13:43   Dg Foot Complete Left  Result Date: 08/13/2016 CLINICAL DATA:  Pain all over left foot.  No known injury. EXAM: LEFT FOOT - COMPLETE 3+ VIEW COMPARISON:  None. FINDINGS: Hallux valgus deformity. Small plantar calcaneal spur. No acute bony abnormality. Specifically, no fracture, subluxation, or dislocation. Soft tissues are intact. IMPRESSION: No acute bony abnormality.  Hallux valgus. Electronically Signed   By: Rolm Baptise M.D.   On: 08/13/2016 09:54    Lab Data:  CBC:  Recent Labs Lab 08/15/16 0612 08/16/16 0243 08/17/16 0708 08/18/16 0559  WBC 13.9* 12.8* 10.4 9.3  NEUTROABS  --   --  8.2*  --   HGB 9.6* 9.5* 11.1*  10.0*  HCT 29.2* 28.1* 34.7* 30.6*  MCV 86.4 84.4 88.1 87.7  PLT 340 374 374 [id number]   Basic Metabolic Panel:  Recent Labs Lab 08/15/16 0612 08/16/16 0243 08/17/16 0708 08/18/16 0559 08/20/16 0515 08/21/16 0502  NA 138 140 140 140 140 141  K 3.4* 3.7 4.0 3.5 4.2 3.8  CL 101 104 106 106 106 109  CO2 27 26 24 23 25  21*  GLUCOSE 91 98 71 98 82 79  BUN 17 19 16 15 13 17   CREATININE 0.98 1.08 1.08 1.15 1.17 1.17  CALCIUM 8.3* 8.4* 8.3* 8.4* 8.2* 8.3*  MG 2.2  --   --   --   --   --    GFR: Estimated Creatinine Clearance: 44.7 mL/min (by C-G formula based on SCr of 1.17 mg/dL). Liver Function Tests: No results for input(s): AST, ALT, ALKPHOS, BILITOT, PROT, ALBUMIN in the last 168 hours. No results for input(s): LIPASE, AMYLASE in the last 168 hours. No results for input(s): AMMONIA in the last 168 hours. Coagulation Profile: No results for input(s): INR, PROTIME in the last 168 hours. Cardiac Enzymes: No results for input(s): CKTOTAL, CKMB, CKMBINDEX, TROPONINI in the last 168 hours. BNP (last 3 results) No results for input(s): PROBNP in the last 8760 hours. HbA1C: No results for input(s): HGBA1C in the last 72 hours. CBG: No results for input(s): GLUCAP in the last 168 hours. Lipid Profile: No results for input(s): CHOL, HDL, LDLCALC, TRIG, CHOLHDL, LDLDIRECT in the last 72 hours. Thyroid Function Tests: No results for input(s): TSH, T4TOTAL, FREET4, T3FREE, THYROIDAB in the last 72 hours. Anemia Panel: No results for input(s): VITAMINB12, FOLATE, FERRITIN, TIBC, IRON, RETICCTPCT in the last 72 hours. Urine analysis:    Component Value Date/Time   COLORURINE AMBER (A) 08/16/2016 1358   APPEARANCEUR HAZY (A) 08/16/2016 1358   LABSPEC 1.019 08/16/2016 1358   PHURINE 6.0 08/16/2016 1358   GLUCOSEU NEGATIVE 08/16/2016 1358   HGBUR NEGATIVE 08/16/2016 1358   BILIRUBINUR NEGATIVE 08/16/2016 1358   KETONESUR NEGATIVE 08/16/2016 1358   PROTEINUR NEGATIVE 08/16/2016 1358    UROBILINOGEN 4.0 (H) 01/30/2014 1402   NITRITE NEGATIVE 08/16/2016 1358   LEUKOCYTESUR NEGATIVE 08/16/2016 [place] M.D. [facility] 08/21/2016, 12:18 PM  Pager: [phone] Between 7am to 7pm - call Pager - [phone]  After 7pm go to [email or web address] - password [facility]  Call night coverage person covering after 7pm

## 2016-08-21 NOTE — Progress Notes (Signed)
Patient has not had any urine output since 1805. Bladder scanned patient for 0 ml urine.Patient has poor po intake encouraging patient to drink more.Text paged MD N.Carter awaiting response.

## 2016-08-22 MED ORDER — POLYETHYLENE GLYCOL 3350 17 G PO PACK: 17.0000 g | PACK | Freq: Every day | ORAL | 0 refills | Status: DC | PRN

## 2016-08-22 MED ORDER — COLCHICINE 0.6 MG PO TABS: 0.6000 mg | ORAL_TABLET | Freq: Every day | ORAL | Status: AC

## 2016-08-22 MED ORDER — PANTOPRAZOLE SODIUM 40 MG PO TBEC: 40.0000 mg | DELAYED_RELEASE_TABLET | Freq: Two times a day (BID) | ORAL | Status: DC

## 2016-08-22 MED ORDER — OXYCODONE HCL 5 MG PO TABS: 5.0000 mg | ORAL_TABLET | Freq: Four times a day (QID) | ORAL | 0 refills | Status: DC | PRN

## 2016-08-22 MED ORDER — SENNOSIDES-DOCUSATE SODIUM 8.6-50 MG PO TABS
1.0000 | ORAL_TABLET | Freq: Two times a day (BID) | ORAL | Status: DC
  Administered 2016-08-22: 1 via ORAL
  Filled 2016-08-22: qty 1

## 2016-08-22 MED ORDER — ONDANSETRON 4 MG PO TBDP: 4.0000 mg | ORAL_TABLET | Freq: Four times a day (QID) | ORAL | 0 refills | Status: AC | PRN

## 2016-08-22 MED ORDER — MEGESTROL ACETATE 40 MG PO TABS: 40.0000 mg | ORAL_TABLET | Freq: Every day | ORAL | Status: DC

## 2016-08-22 MED ORDER — METOPROLOL TARTRATE 100 MG PO TABS: 100.0000 mg | ORAL_TABLET | Freq: Two times a day (BID) | ORAL | Status: DC

## 2016-08-22 MED ORDER — MIRTAZAPINE 15 MG PO TBDP: 7.5000 mg | ORAL_TABLET | Freq: Every day | ORAL | Status: DC

## 2016-08-22 MED ORDER — ENSURE ENLIVE PO LIQD: 237.0000 mL | Freq: Two times a day (BID) | ORAL | 12 refills | Status: DC

## 2016-08-22 MED ORDER — AMIODARONE HCL 400 MG PO TABS: 400.0000 mg | ORAL_TABLET | Freq: Every day | ORAL | Status: DC

## 2016-08-22 MED ORDER — ADULT MULTIVITAMIN W/MINERALS CH: 1.0000 | ORAL_TABLET | Freq: Every day | ORAL | Status: AC

## 2016-08-22 MED ORDER — SENNOSIDES-DOCUSATE SODIUM 8.6-50 MG PO TABS: 1.0000 | ORAL_TABLET | Freq: Two times a day (BID) | ORAL | Status: DC

## 2016-08-22 MED ORDER — DIPHENHYDRAMINE HCL 25 MG PO CAPS: 25.0000 mg | ORAL_CAPSULE | Freq: Four times a day (QID) | ORAL | 0 refills | Status: DC | PRN

## 2016-08-22 MED ORDER — POLYETHYLENE GLYCOL 3350 17 G PO PACK: 17.0000 g | PACK | Freq: Every day | ORAL | Status: DC | PRN

## 2016-08-22 NOTE — Clinical Social Work Placement (Signed)
   CLINICAL SOCIAL WORK PLACEMENT  NOTE  Date:  08/22/2016  Patient Details  Name: Gregory [name] MRN: [id number] Date of Birth: 1929-10-17  Clinical Social Work is seeking post-discharge placement for this patient at the [place] level of care (*CSW will initial, date and re-position this form in  chart as items are completed):  Yes   Patient/family provided with [place] Work Department's list of facilities offering this level of care within the geographic area requested by the patient (or if unable, by the patient's family).  Yes   Patient/family informed of their freedom to choose among providers that offer the needed level of care, that participate in Medicare, Medicaid or managed care program needed by the patient, have an available bed and are willing to accept the patient.  Yes   Patient/family informed of [place]'s ownership interest in [facility] and [facility], as well as of the fact that they are under no obligation to receive care at these facilities.  PASRR submitted to EDS on 08/12/16     PASRR number received on 08/12/16     Existing PASRR number confirmed on       FL2 transmitted to all facilities in geographic area requested by pt/family on 08/12/16     FL2 transmitted to all facilities within larger geographic area on       Patient informed that his/her managed care company has contracts with or will negotiate with certain facilities, including the following:        Yes   Patient/family informed of bed offers received.  Patient chooses bed at [place] recommends and patient chooses bed at      Patient to be transferred to [facility] on 08/21/16.  Patient to be transferred to facility by [facility]     Patient family notified on   of transfer.  Name of family member notified:        PHYSICIAN Please prepare prescriptions, Please prepare priority discharge summary,  including medications     Additional Comment:    _______________________________________________ Serafina Mitchell, [place] 08/22/2016, 3:43 PM

## 2016-08-22 NOTE — Progress Notes (Signed)
Report given to Gregory [name] at [facility].  [phone]) Pt in no acute distress.  Condom cath will be removed prior to pt getting d/c.  IV site clean and intact.  Pt aware that transport will pick him up soon.  LBM 08/20/16, one senokot plus given to pt today, Robin aware.

## 2016-08-22 NOTE — Discharge Summary (Signed)
Physician Discharge Summary   Patient ID: Gregory [name] MRN: [id number] DOB/AGE: 1929-12-14 83 y.o.  Admit date: 08/05/2016 Discharge date: 08/22/2016  Primary Care Physician:  [name], MD  Discharge Diagnoses:   . PAF (paroxysmal atrial fibrillation) (HCC) with RVR . Failure to thrive with poor oral intake, oliguria . Incarcerated left inguinal hernia . Pericardial effusion . Coronary artery disease . Hyperlipidemia . Hypertension . Pericardial effusion with temp not . Postop acute blood loss anemia . Chronic diastolic heart failure . Possible cardiac amyloidosis   Moderate to severe deconditioning and debility   Consults: Cardiology Gen. surgery  Recommendations for Outpatient Follow-up:  1. Please have palliative care follow patient at skilled nursing facility for goals of care, poor oral intake, debility and deconditioning, failure to thrive, patient is started on appetite stimulant 2. Please repeat CBC/BMET at next visit   DIET: Heart healthy diet    Allergies:   Allergies  Allergen Reactions  . Sulfa Antibiotics   . Ramipril   . Ramipril Other (See Comments)  . Sulfamethoxazole Rash     DISCHARGE MEDICATIONS: Current Discharge Medication List    START taking these medications   Details  amiodarone (PACERONE) 400 MG tablet Take 1 tablet (400 mg total) by mouth daily.    colchicine 0.6 MG tablet Take 1 tablet (0.6 mg total) by mouth daily.    diphenhydrAMINE (BENADRYL) 25 mg capsule Take 1 capsule (25 mg total) by mouth every 6 (six) hours as needed for itching. Qty: 30 capsule, Refills: 0    feeding supplement, ENSURE ENLIVE, (ENSURE ENLIVE) LIQD Take 237 mLs by mouth 2 (two) times daily between meals. Qty: 237 mL, Refills: 12    megestrol (MEGACE) 40 MG tablet Take 1 tablet (40 mg total) by mouth daily.    mirtazapine (REMERON SOL-TAB) 15 MG disintegrating tablet Take 0.5 tablets (7.5 mg total) by mouth at bedtime.    Multiple Vitamin  (MULTIVITAMIN WITH MINERALS) TABS tablet Take 1 tablet by mouth daily.    ondansetron (ZOFRAN-ODT) 4 MG disintegrating tablet Take 1 tablet (4 mg total) by mouth every 6 (six) hours as needed for nausea. Qty: 20 tablet, Refills: 0    oxyCODONE (OXY IR/ROXICODONE) 5 MG immediate release tablet Take 1 tablet (5 mg total) by mouth every 6 (six) hours as needed for severe pain. Qty: 10 tablet, Refills: 0    pantoprazole (PROTONIX) 40 MG tablet Take 1 tablet (40 mg total) by mouth 2 (two) times daily.    polyethylene glycol (MIRALAX / GLYCOLAX) packet Take 17 g by mouth daily as needed for mild constipation or moderate constipation. Qty: 14 each, Refills: 0    senna-docusate (SENOKOT-S) 8.6-50 MG tablet Take 1 tablet by mouth 2 (two) times daily.      CONTINUE these medications which have CHANGED   Details  metoprolol (LOPRESSOR) 100 MG tablet Take 1 tablet (100 mg total) by mouth 2 (two) times daily.      CONTINUE these medications which have NOT CHANGED   Details  apixaban (ELIQUIS) 5 MG TABS tablet Take 1 tablet (5 mg total) by mouth 2 (two) times daily. Qty: 60 tablet, Refills: 10    atorvastatin (LIPITOR) 40 MG tablet Take 1 tablet (40 mg total) by mouth daily. Qty: 90 tablet, Refills: 3    brimonidine (ALPHAGAN) 0.2 % ophthalmic solution Place 1 drop into both eyes at bedtime.      STOP taking these medications     amLODipine (NORVASC) 5 MG tablet  aspirin EC 81 MG tablet      furosemide (LASIX) 20 MG tablet      isosorbide mononitrate (IMDUR) 30 MG 24 hr tablet      losartan (COZAAR) 25 MG tablet          Brief H and P: For complete details please refer to admission H and P, but in brief Patient is a 83 year old gentleman history of hypertension, nonhemorrhagic CVA, history of GI bleed, coronary artery disease status post STEMI August 2017, CHF, atrial fibrillation on anticoagulation who presented to the ED with complaints of progressively worsening low  abdominal pain nausea vomiting. Patient noted to have an incarcerated left inguinal hernia and underwent emergent diagnostic laparoscopy with reduction of strangulated small bowel from left inguinal hernia,TAPP repair with phasix mesh, small bowel resection per Dr. Windle Guard 08/05/2016. Patient was assessed by cardiology preoperatively for clearance a repeat 2-D echo was done postoperatively to assess for pericardial effusion and patient noted to have a large per cardiology effusion with evidence of tamponade physiology including hypotension in the setting as SVT. Patient subsequently underwent pericardiocentesis per Dr. Burt Knack 08/06/2016. Patient also noted to be in A. fib with RVR and currently on amiodarone and beta blocker. Anticoagulation on hold secondary to concern for hemorrhagic compression would recent pericardiocentesis. Cardiology following. Patient also noted to have melanotic stools 08/13/2016 as well as 08/14/2016. H&H stable. General surgery following. [facility] were asked to take over patient's care 08/13/2016.  Hospital Course:   Incarcerated left inguinal hernia - Status post Laparoscopic repair of incarcerated inguinal hernia with small bowel resection per Dr. Kae Heller 08/05/2016 - Previousmelanotic stools may be secondary from anastomosis site - H/H improved stable. Staples have been removed - Patient has follow-up appointment with Dr. Kae Heller on 08/26/16   Melanotic stools/postop acute blood loss anemia - H&H currently stable,  - Continue PPI   Pericardial effusion/tamponade -Patient initially had tamponade features requiring pericardiocentesis per Dr. Burt Knack 08/06/2016, Drain removed on  08/08/2016 - 2-D echo from 08/13/2016 with moderate-sized pleural effusion without tamponade  Atrial fibrillation with RVR - Continued to have elevated heart rate in 120s despite amiodarone high dose 400 mg TID and metoprolol 100mg  BID, hence cardioversion was decided. However rate  became controlled and cardioversion was canceled on 08/20/16. Now amiodarone has been tapered down to 400 mg daily, continue metoprolol. Heart rate has remained controlled and much better. - Patient started on Eliquis on 1/2 by cardiology.    Coronary artery disease status post STEMI August 2017 -Currently asymptomatic -Continue Lipitor, Lopressor, amiodarone, eliquis  Failure to thrive, poor oral intake, oliguria - oliguria as patient hardly drinking or eating much - Placed on appetite stimulant, Megace, was given overnight fluid bolus and gentle hydration on 1/6 - Palliative Medicine consult was placed for goals of care, should be followed at skilled nursing facility  Hypertension -Continue metoprolol.  Chronic diastolic heart failure -Compensated -Continue Lipitor, Lopressor.  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  Hyperlipidemia -Continue statin.  Deconditioning - PTOT evaluation recommended skilled nursing facility   Day of Discharge BP 121/71 (BP Location: Right Arm)   Pulse 63   Temp 98.7 F (37.1 C) (Oral)   Resp (!) 28   Ht 5\' 11"  (1.803 m)   Wt 67.1 kg (148 lb)   SpO2 100%   BMI 20.64 kg/m   Physical Exam: General: Alert and awake oriented x3 not  in any acute distress. HEENT: anicteric sclera, pupils reactive to light and accommodation CVS: S1-S2 clear no murmur rubs or gallops Chest: clear to auscultation bilaterally, no wheezing rales or rhonchi Abdomen: soft nontender, nondistended, normal bowel sounds Extremities: no cyanosis, clubbing or edema noted bilaterally Neuro: Cranial nerves II-XII intact, no focal neurological deficits   The results of significant diagnostics from this hospitalization (including imaging, microbiology, ancillary and laboratory) are listed below for reference.    LAB RESULTS: Basic  Metabolic Panel:  Recent Labs Lab 08/20/16 0515 08/21/16 0502  NA 140 141  K 4.2 3.8  CL 106 109  CO2 25 21*  GLUCOSE 82 79  BUN 13 17  CREATININE 1.17 1.17  CALCIUM 8.2* 8.3*   Liver Function Tests: No results for input(s): AST, ALT, ALKPHOS, BILITOT, PROT, ALBUMIN in the last 168 hours. No results for input(s): LIPASE, AMYLASE in the last 168 hours. No results for input(s): AMMONIA in the last 168 hours. CBC:  Recent Labs Lab 08/17/16 0708 08/18/16 0559  WBC 10.4 9.3  NEUTROABS 8.2*  --   HGB 11.1* 10.0*  HCT 34.7* 30.6*  MCV 88.1 87.7  PLT 374 334   Cardiac Enzymes: No results for input(s): CKTOTAL, CKMB, CKMBINDEX, TROPONINI in the last 168 hours. BNP: Invalid input(s): POCBNP CBG: No results for input(s): GLUCAP in the last 168 hours.  Significant Diagnostic Studies:  Ct Abdomen Pelvis W Contrast  Result Date: 08/05/2016 CLINICAL DATA:  Upper abdominal pain and nausea EXAM: CT ABDOMEN AND PELVIS WITH CONTRAST TECHNIQUE: Multidetector CT imaging of the abdomen and pelvis was performed using the standard protocol following bolus administration of intravenous contrast. CONTRAST:  172mL ISOVUE-300 IOPAMIDOL (ISOVUE-300) INJECTION 61% COMPARISON:  03/30/2016. FINDINGS: Lower chest: Lung bases demonstrate some mild dependent atelectatic changes. A large pericardial effusion is identified which is increased in the interval from the prior MRI of 03/30/2016. It measures approximately 3.7 cm along the posterior wall of the left ventricle. Hepatobiliary: Mild early heterogeneity to the liver is noted related to the timing of the contrast bolus. Multiple gallstones are seen without complicating factors. Pancreas: Unremarkable. No pancreatic ductal dilatation or surrounding inflammatory changes. Spleen: Normal in size without focal abnormality. Adrenals/Urinary Tract: Adrenal glands are unremarkable. Kidneys are normal, without renal calculi, focal lesion, or hydronephrosis.  Bladder is decompressed. Stomach/Bowel: Scattered diverticular changes noted without evidence of diverticulitis. The appendix is not well visualized although no inflammatory changes to suggest appendicitis are seen. There is evidence of small bowel dilatation secondary to a left inguinal hernia. A loop of small bowel extends into the hernia and is within normal limits distal to this although obstructed proximally. Vascular/Lymphatic: Aortic atherosclerosis. No enlarged abdominal or pelvic lymph nodes. Reproductive: Prostate appears to have been surgically removed. Other: No abdominal wall hernia or abnormality. No abdominopelvic ascites. Musculoskeletal: No acute or significant osseous findings. IMPRESSION: Left inguinal hernia containing a loop of small bowel with evidence of incarceration and proximal obstruction. Enlarging pericardial effusion as described above. Cholelithiasis without complicating factors. Diverticulosis without diverticulitis. Electronically Signed   By: Inez Catalina M.D.   On: 08/05/2016 09:29   Dg Chest Port 1 View  Result Date: 08/05/2016 CLINICAL DATA:  Peripheral effusion, shortness of breath EXAM: PORTABLE CHEST 1 VIEW COMPARISON:  CT chest 03/27/2016 FINDINGS: There is no focal parenchymal opacity. There is no pleural effusion or pneumothorax. The cardiac silhouette is enlarged likely reflecting a pericardial effusion given the prior exams. The osseous structures are unremarkable. IMPRESSION: 1. Enlarged cardiac silhouette  likely reflecting a pericardial effusion. Electronically Signed   By: Kathreen Devoid   On: 08/05/2016 13:43    2D ECHO: Study Conclusions  - Left ventricle: The cavity size was normal. There was severe   concentric hypertrophy. LV mid-cavity obliteration with LV   mid-cavity gradient to 22 mmHg peak. Systolic function was   normal. The estimated ejection fraction was in the range of 55%   to 60%. Indeterminant diastolic function (not assessed). Wall    motion was normal; there were no regional wall motion   abnormalities. - Aortic valve: There was no stenosis. - Mitral valve: Mildly calcified annulus. There was trivial   regurgitation. - Left atrium: The atrium was mildly to moderately dilated. - Right ventricle: The cavity size was normal. Wall thickness was   mildly to moderately increased. Systolic function was mildly to   moderately reduced. - Right atrium: The atrium was mildly dilated. - Pulmonary arteries: No complete TR doppler jet so unable to   estimate PA systolic pressure. - Systemic veins: IVC not visualized. - Pericardium, extracardiac: Moderate circumferential pericardial   effusion, primarily located posteriorly. 2.0 cm thickness,   similar to prior. There does not appear to be tamponade present.  Impressions:  - Normal LV size with severe concentric LV hypertrophy, EF 55-60%.   Normal RV size with mild to moderate RV hypertrophy and mild to   moderate RV systolic dysfunction. Moderate pericardial effusion   without tamponade, primarily posterior, similar to prior.   Findings suggestive of cardiac amyloidosis  Disposition and Follow-up: Discharge Instructions    Diet - low sodium heart healthy    Complete by:  As directed    Increase activity slowly    Complete by:  As directed        DISPOSITION: SNF    DISCHARGE FOLLOW-UP  Contact information for follow-up providers    Clovis Riley, MD. Go on 08/26/2016.   Specialty:  General Surgery Why:  appointment at 11:45AM Contact information: [phone]        Shelva Majestic, MD. Schedule an appointment as soon as possible for a visit in 2 week(s).   Specialty:  Cardiology Contact information: [address] [place] [address] [phone]            Contact information for after-discharge care    Destination    [facility] Follow up.   Specialty:  [place] information: [id number]  [address] [place] [id number]                   Time spent on Discharge: 82mins   Signed:   Miroslava Santellan M.D. [facility] 08/22/2016, 9:59 AM Pager: [phone]

## 2016-08-22 NOTE — Clinical Social Work Note (Signed)
CSW notified pt medically cleared to DC. Clinical Social Worker facilitated patient discharge including contacting patient family and facility to confirm patient discharge plans.Clinical information faxed to facility and family agreeable with plan, spoke to pt's son.  CSW arranged ambulance transport via [facility] to. RN to call report [phone] prior to discharge.  Clinical Social Worker will sign off for now as social work intervention is no longer needed. Please consult [place] again if new need arises.  Evalisse Prajapati B. Joline Maxcy Clinical Social Work Dept Weekend Social Worker [phone] 1:51 PM

## 2016-08-24 ENCOUNTER — Other Ambulatory Visit: Payer: Self-pay

## 2016-08-24 ENCOUNTER — Non-Acute Institutional Stay (SKILLED_NURSING_FACILITY): Payer: Medicare Other | Admitting: Internal Medicine

## 2016-08-24 ENCOUNTER — Encounter: Payer: Self-pay | Admitting: Internal Medicine

## 2016-08-24 DIAGNOSIS — I313 Pericardial effusion (noninflammatory): Secondary | ICD-10-CM | POA: Diagnosis not present

## 2016-08-24 DIAGNOSIS — R627 Adult failure to thrive: Secondary | ICD-10-CM | POA: Diagnosis not present

## 2016-08-24 DIAGNOSIS — I480 Paroxysmal atrial fibrillation: Secondary | ICD-10-CM | POA: Diagnosis not present

## 2016-08-24 DIAGNOSIS — K4030 Unilateral inguinal hernia, with obstruction, without gangrene, not specified as recurrent: Secondary | ICD-10-CM

## 2016-08-24 DIAGNOSIS — I3139 Other pericardial effusion (noninflammatory): Secondary | ICD-10-CM

## 2016-08-24 MED ORDER — TRAMADOL HCL 50 MG PO TABS: 25.0000 mg | ORAL_TABLET | Freq: Three times a day (TID) | ORAL | 0 refills | Status: DC

## 2016-08-24 NOTE — Progress Notes (Signed)
Facility Location: [facility]  Room Number: 107   Code Status:   PCP: [name], MD [address] [place] [address]   This is a comprehensive admission note to [facility] performed on this date less than 30 days from date of admission. Included are preadmission medical/surgical history;reconciled medication list; family history; social history and comprehensive review of systems.  Corrections and additions to the records were documented . Comprehensive physical exam was also performed. Additionally a clinical summary was entered for each active diagnosis pertinent to this admission in the Problem List to enhance continuity of care.   HPI: Patient was hospitalized 08/05/16-08/22/16 for treatment of multiple issues. Initial presentation was low abdominal pain associated with nausea and vomiting. Incarcerated left inguinal hernia required emergent laparoscopy with reduction of strangulated small bowel .TAPP repair with  small bowel resection & mesh placement by Dr. Windle Guard was completed 12/21. Echocardiogram revealed a pericardial effusion and suggested possible amyloidosis. Tamponade was clinically present with hypotension.. Pericardiocentesis was performed by Dr. Burt Knack 12/22. Atrial fib with rapid ventricular response ws present.Anticoagulation was on hold due to concern for hemorrhagic compression in the context of the recent pericardiocentesis. Additionally melanotic stools were noted 12/29 and 12/30. This was felt to be related to the bowel surgery with transitory bleeding from the anastomotic site. Follow-up with Dr. Windle Guard was to be 08/26/16.Despite the melena H&H were stable. PPI was continued.  The tachycardia persisted with heart rates in the 120s despite high-dose amiodarone and metoprolol; cardioversion was considered but canceled as his rate responded. Amiodarone was tapered down to 400 mg daily with continuation of  metoprolol 100 mg twice a day. He was also started on Eliquis by Cardiology. Patient has multiple co-morbidities and was seen for palliative care for adult failure to thrive. This is associated with oliguria due to decreased oral  fluids. Megace was initiated & palliative medicine recommended placement in skilled nursing for rehabilitation.  Labs prior to discharge revealed a calcium of 8.3, hemoglobin 10/hematocrit 30.6. Other labs were normal.  Past medical and surgical history includes hypertension, nonhemorrhagic stroke, STEMI, congestive heart failure, history of gout and prostate cancer. He had prostatectomy possibly in 1990s.  Social history: Reviewed, tobacco use is denied.  Family history: Reviewed  Review of systems: Major symptoms include anorexia. He denies any pain except for occasional pain in the right foot. He does describe numbness left hand related to his previous stroke. He also has a feeling that his feet are "puffy". He cannot tell when he has tachycardia. He does have exertional dyspnea. He also describes some dysphagia.Stools have been watery. He describes some anxiety and depression. Lips are dry.  Constitutional: No fever  Eyes: No redness, discharge, pain ENT/mouth: No nasal congestion,  purulent discharge, earache,change in hearing ,sore throat  Cardiovascular: No chest pain, palpitations,paroxysmal nocturnal dyspnea, claudication, edema  Respiratory: No cough, sputum production,hemoptysis,  significant snoring,apnea   Gastrointestinal: No heartburn,abdominal pain, nausea / vomiting,rectal bleeding, melena @ present Genitourinary: No dysuria,hematuria, pyuria,  incontinence, nocturia Dermatologic: No rash, pruritus, change in appearance of skin Neurologic: No dizziness,headache,syncope, seizures Endocrine: No change in hair/skin/ nails, excessive thirst, excessive hunger, excessive urination  Hematologic/lymphatic: No significant bruising, lymphadenopathy,abnormal  bleeding Allergy/immunology: No itchy/ watery eyes, significant sneezing, urticaria, angioedema  Physical exam:  Pertinent or positive findings: He appears thin and poorly nourished. Arcus senilis is present. The left eye is blind. Higinio Plan and mustache are closely shaven. Patchy alopecia is present. He has  a grade 123XX123 systolic murmur at the base. Heart sounds are distant overall. Also breath sounds are decreased but he does have low-grade rales at the bases. Abdominal operative site is Steri-Stripped. He has trace pedal edema. Pedal pulses are decreased. Strength to opposition is surprisingly good.  General appearance:no acute distress , increased work of breathing is present.   Lymphatic: No lymphadenopathy about the head, neck, axilla . Eyes: No conjunctival inflammation or lid edema is present. There is no scleral icterus. Ears:  External ear exam shows no significant lesions or deformities.   Nose:  External nasal examination shows no deformity or inflammation. Nasal mucosa are pink and moist without lesions ,exudates Oral exam: lips and gums are healthy appearing.There is no oropharyngeal erythema or exudate . Neck:  No thyromegaly, masses, tenderness noted.    Heart:  Normal rate and regular rhythm. S1 and S2 normal without gallop, click, rub .  Abdomen:Bowel sounds are normal. Abdomen is soft and nontender with no organomegaly, hernias,masses. GU: deferred as previously addressed. Extremities:  No cyanosis, clubbing  Neurologic exam : Strength equal  in upper & lower extremities Balance,Rhomberg,finger to nose testing could not be completed due to clinical state Skin: Warm & dry w/o tenting. No significant lesions or rash.  See clinical summary under each active problem in the Problem List with associated updated therapeutic plan

## 2016-08-24 NOTE — Patient Instructions (Signed)
See Current Assessment & Plan in Problem List under specific Diagnosis 

## 2016-08-24 NOTE — Assessment & Plan Note (Signed)
Clinically no sign of recurrence or associated complications on exam

## 2016-08-24 NOTE — Telephone Encounter (Signed)
Prescription request was received from:    [facility] [address] [place] [address]  Phone: [phone] Fax: [phone] 

## 2016-08-24 NOTE — Assessment & Plan Note (Signed)
08/24/16 heart rate adequately controlled and clinically patient in regular rhythm Continue metoprolol amiodarone, and L request

## 2016-08-24 NOTE — Assessment & Plan Note (Addendum)
Focus on nutrition and PT/OT Palliative care reinitiation if he fails to progress

## 2016-08-24 NOTE — Assessment & Plan Note (Signed)
08/24/16 patient clinically stable from a cardiovascular standpoint at this time

## 2016-08-25 LAB — BASIC METABOLIC PANEL
BUN: 9 mg/dL (ref 4–21)
CREATININE: 0.8 mg/dL (ref 0.6–1.3)
Glucose: 81 mg/dL
POTASSIUM: 3.7 mmol/L (ref 3.4–5.3)
SODIUM: 140 mmol/L (ref 137–147)

## 2016-08-25 LAB — CBC AND DIFFERENTIAL
HCT: 34 % — AB (ref 41–53)
HEMOGLOBIN: 10.4 g/dL — AB (ref 13.5–17.5)
Platelets: 172 10*3/uL (ref 150–399)
WBC: 4.5 10*3/mL

## 2016-08-31 ENCOUNTER — Other Ambulatory Visit: Payer: Self-pay | Admitting: *Deleted

## 2016-08-31 NOTE — Patient Outreach (Signed)
[facility]) Care Management  08/31/2016  Gregory [name] 1929/09/14 [id number]   Met with patient and a friend at bedside.  Patient states he is very weak but is participating in therapy.  Patient unsure of discharge plans, but wants to go home. He states he has good support system.  RNCM left brochure for patient nephew at patient request.   Spoke with Cameron Ali, SW at facility, discussed that patient is eligible for [facility] services upon discharge.  She states patient has a lot of support and visitors, he has a son who lives in [place].  Plan to follow up as indicated closer to discharge.  Royetta Crochet. Laymond Purser, RN, BSN, [place] Acute Care Coordinator [phone]

## 2016-09-01 ENCOUNTER — Non-Acute Institutional Stay (SKILLED_NURSING_FACILITY): Payer: Medicare Other | Admitting: Internal Medicine

## 2016-09-01 ENCOUNTER — Encounter: Payer: Self-pay | Admitting: Internal Medicine

## 2016-09-01 DIAGNOSIS — R1310 Dysphagia, unspecified: Secondary | ICD-10-CM

## 2016-09-01 DIAGNOSIS — R1319 Other dysphagia: Secondary | ICD-10-CM

## 2016-09-01 DIAGNOSIS — J3089 Other allergic rhinitis: Secondary | ICD-10-CM | POA: Diagnosis not present

## 2016-09-01 DIAGNOSIS — R110 Nausea: Secondary | ICD-10-CM

## 2016-09-01 NOTE — Progress Notes (Signed)
This is a nursing facility follow up for specific acute issue of "cough" Interim medical record and care since last Roberts visit was updated with review of diagnostic studies and change in clinical status since last visit were documented.  HPI: The patient was reported as complaining of cough. Actually his main concerns are being "sick" with nausea and being "weak". He describes dysphagia  and anorexia with associated poor intake. His diet has been downgraded by speech therapy to pured.. He has had  regurgitation of thick clear mucus. He denies any recent bowel movements. He is on a PPI twice a day. He has a history of stomach ulcers and previous GI bleed in the context of gastritis and diverticulosis.   He stated he has had some dark nasal mucus & some shortness of breath . Subjectively there's been some improvement in his symptoms with Mucinex. He has no other extrinsic or upper respiratory tract symptoms. He  has never smoked and has no history of asthma. He's had no fever or purulent sputum. He apparently had a prostatectomy in the 1990s.  Review of systems: Constitutional: No fever,chills Eyes: No redness, discharge, pain, vision change ENT/mouth: No  purulent discharge, earache,change in hearing ,sore throat  Cardiovascular: No chest pain, palpitations,paroxysmal nocturnal dyspnea, claudication, edema  Respiratory: No hemoptysis  Gastrointestinal: No heartburn,rectal bleeding, melena Genitourinary: No dysuria,hematuria, pyuria,  incontinence, nocturia Dermatologic: No rash, pruritus, change in appearance of skin Hematologic/lymphatic: No significant bruising, lymphadenopathy,abnormal bleeding Allergy/immunology: No itchy/ watery eyes, significant sneezing, urticaria, angioedema  Physical exam:  Pertinent or positive findings: He appears suboptimally nourished. He is in a chair and intermittently expectorates some clear mucus. There is minor slight yellow material  in the clear mucus. Clear drainage noted from the right nose. Breath sounds are decreased. Bowel sounds are active. The operative site is well-healed. Heart sounds are decreased. Rhythm is regular. Limbs are atrophic. Pedal pulses are decreased.  General appearance: no acute distress , increased work of breathing is present.   Lymphatic: No lymphadenopathy about the head, neck, axilla . Eyes: No conjunctival inflammation or lid edema is present. There is no scleral icterus. Ears:  External ear exam shows no significant lesions or deformities.   Nose:  External nasal examination shows no deformity or inflammation. Nasal mucosa are pink and moist without lesions ,exudates Oral exam: lips and gums are healthy appearing.There is no oropharyngeal erythema or exudate . Neck:  No thyromegaly, masses, tenderness noted.    Heart:  Normal rate and regular rhythm. S1 and S2 normal without gallop, murmur, click, rub .  Lungs:Chest clear to auscultation without wheezes, rhonchi,rales , rubs. Abdomen: Abdomen is soft and nontender with no organomegaly, hernias,masses. GU: deferred  Extremities:  No cyanosis, edema  Neurologic exam : Strength decreased in upper & lower extremities Balance,Rhomberg,finger to nose testing could not be completed due to clinical state Skin: Warm & dry w/o tenting. No significant lesions or rash.  #1 nausea with regurgitation of clear mucus #2 dysphagia #3 rhinitis without purulence Plan: CBC, lipase, hepatic panel, As colchicine can cause nausea ; it will be discontinued. Uric acid will be checked to verify the necessity for this drug. He is on a statin, liver function test will be checked. Zofran as needed is a standing order. Discussion: Likely dysphagia, nausea, regurgitation of saliva in particular matter appears to be the problem rather than a pulmonary issue. Breath sounds are decreased but there are no abnormal breath sounds. Oxygen saturation saturations are  excellent.

## 2016-09-01 NOTE — Patient Instructions (Signed)
See Current Assessment & Plan  

## 2016-09-02 LAB — CBC AND DIFFERENTIAL
HEMATOCRIT: 42 % (ref 41–53)
Hemoglobin: 12.5 g/dL — AB (ref 13.5–17.5)
Platelets: 139 10*3/uL — AB (ref 150–399)
WBC: 4.9 10^3/mL

## 2016-09-02 LAB — HEPATIC FUNCTION PANEL
ALT: 44 U/L — AB (ref 10–40)
AST: 50 U/L — AB (ref 14–40)
Alkaline Phosphatase: 108 U/L (ref 25–125)
Bilirubin, Total: 1.3 mg/dL

## 2016-09-07 ENCOUNTER — Other Ambulatory Visit: Payer: Self-pay | Admitting: *Deleted

## 2016-09-07 NOTE — Patient Outreach (Signed)
[facility]) Care Management  09/07/2016  DEXTER SIGNOR [birth year] [id number]   Met with Tillie Rung, SW at facility, she states that patient plans to discharge home tomorrow, therapy has not discharged patient as of yet. She confirms she will setup home care for patient.   Met with patient and a close family friend. He states that he wants to go home with home care. Reviewed [facility] program services again with patient and friend. Patient friend has a contact at [facility] and patient will have home care.  Patient states he has transportation to medical appointments and his friends assist with meals.  He does report one medication that is expensive-Eliquis. RNCM reviewed [facility] care management pharmacists can review medications and assess for any assistance that he may be qualified.   Patient has [facility] brochure and RNCM contact information.   Plan to sign off case as patient does not agree to [facility] care management services at this time.   Royetta Crochet. Laymond Purser, RN, BSN, [place] [phone]) Business Cell  [phone]) Toll Free Office

## 2016-09-17 ENCOUNTER — Emergency Department (HOSPITAL_COMMUNITY): Payer: Medicare Other

## 2016-09-17 ENCOUNTER — Encounter (HOSPITAL_COMMUNITY): Payer: Self-pay

## 2016-09-17 ENCOUNTER — Inpatient Hospital Stay (HOSPITAL_COMMUNITY)
Admission: EM | Admit: 2016-09-17 | Discharge: 2016-09-23 | DRG: 438 | Disposition: A | Discharge status: Home Health | Payer: Medicare Other | Attending: Internal Medicine | Admitting: Internal Medicine

## 2016-09-17 DIAGNOSIS — B9689 Other specified bacterial agents as the cause of diseases classified elsewhere: Secondary | ICD-10-CM | POA: Diagnosis present

## 2016-09-17 DIAGNOSIS — Z7189 Other specified counseling: Secondary | ICD-10-CM

## 2016-09-17 DIAGNOSIS — K8050 Calculus of bile duct without cholangitis or cholecystitis without obstruction: Secondary | ICD-10-CM | POA: Diagnosis present

## 2016-09-17 DIAGNOSIS — K858 Other acute pancreatitis without necrosis or infection: Secondary | ICD-10-CM

## 2016-09-17 DIAGNOSIS — K851 Biliary acute pancreatitis without necrosis or infection: Principal | ICD-10-CM | POA: Diagnosis present

## 2016-09-17 DIAGNOSIS — R945 Abnormal results of liver function studies: Secondary | ICD-10-CM | POA: Diagnosis present

## 2016-09-17 DIAGNOSIS — E872 Acidosis: Secondary | ICD-10-CM | POA: Diagnosis present

## 2016-09-17 DIAGNOSIS — Z8711 Personal history of peptic ulcer disease: Secondary | ICD-10-CM

## 2016-09-17 DIAGNOSIS — Z79899 Other long term (current) drug therapy: Secondary | ICD-10-CM

## 2016-09-17 DIAGNOSIS — I480 Paroxysmal atrial fibrillation: Secondary | ICD-10-CM | POA: Diagnosis present

## 2016-09-17 DIAGNOSIS — D6959 Other secondary thrombocytopenia: Secondary | ICD-10-CM | POA: Diagnosis present

## 2016-09-17 DIAGNOSIS — I252 Old myocardial infarction: Secondary | ICD-10-CM

## 2016-09-17 DIAGNOSIS — E7800 Pure hypercholesterolemia, unspecified: Secondary | ICD-10-CM | POA: Diagnosis not present

## 2016-09-17 DIAGNOSIS — I248 Other forms of acute ischemic heart disease: Secondary | ICD-10-CM | POA: Diagnosis present

## 2016-09-17 DIAGNOSIS — D638 Anemia in other chronic diseases classified elsewhere: Secondary | ICD-10-CM | POA: Diagnosis present

## 2016-09-17 DIAGNOSIS — K8020 Calculus of gallbladder without cholecystitis without obstruction: Secondary | ICD-10-CM | POA: Diagnosis present

## 2016-09-17 DIAGNOSIS — I5032 Chronic diastolic (congestive) heart failure: Secondary | ICD-10-CM | POA: Diagnosis not present

## 2016-09-17 DIAGNOSIS — Z681 Body mass index (BMI) 19 or less, adult: Secondary | ICD-10-CM

## 2016-09-17 DIAGNOSIS — I313 Pericardial effusion (noninflammatory): Secondary | ICD-10-CM | POA: Diagnosis present

## 2016-09-17 DIAGNOSIS — R627 Adult failure to thrive: Secondary | ICD-10-CM | POA: Diagnosis not present

## 2016-09-17 DIAGNOSIS — E785 Hyperlipidemia, unspecified: Secondary | ICD-10-CM | POA: Diagnosis present

## 2016-09-17 DIAGNOSIS — I10 Essential (primary) hypertension: Secondary | ICD-10-CM | POA: Diagnosis not present

## 2016-09-17 DIAGNOSIS — K8010 Calculus of gallbladder with chronic cholecystitis without obstruction: Secondary | ICD-10-CM

## 2016-09-17 DIAGNOSIS — Z515 Encounter for palliative care: Secondary | ICD-10-CM | POA: Diagnosis present

## 2016-09-17 DIAGNOSIS — R7881 Bacteremia: Secondary | ICD-10-CM | POA: Diagnosis present

## 2016-09-17 DIAGNOSIS — I43 Cardiomyopathy in diseases classified elsewhere: Secondary | ICD-10-CM | POA: Diagnosis present

## 2016-09-17 DIAGNOSIS — E43 Unspecified severe protein-calorie malnutrition: Secondary | ICD-10-CM | POA: Insufficient documentation

## 2016-09-17 DIAGNOSIS — L8990 Pressure ulcer of unspecified site, unspecified stage: Secondary | ICD-10-CM | POA: Insufficient documentation

## 2016-09-17 DIAGNOSIS — I4510 Unspecified right bundle-branch block: Secondary | ICD-10-CM | POA: Diagnosis present

## 2016-09-17 DIAGNOSIS — I2510 Atherosclerotic heart disease of native coronary artery without angina pectoris: Secondary | ICD-10-CM | POA: Diagnosis present

## 2016-09-17 DIAGNOSIS — F329 Major depressive disorder, single episode, unspecified: Secondary | ICD-10-CM | POA: Diagnosis present

## 2016-09-17 DIAGNOSIS — R1084 Generalized abdominal pain: Secondary | ICD-10-CM

## 2016-09-17 DIAGNOSIS — I110 Hypertensive heart disease with heart failure: Secondary | ICD-10-CM | POA: Diagnosis present

## 2016-09-17 DIAGNOSIS — Z8546 Personal history of malignant neoplasm of prostate: Secondary | ICD-10-CM

## 2016-09-17 DIAGNOSIS — Z8249 Family history of ischemic heart disease and other diseases of the circulatory system: Secondary | ICD-10-CM

## 2016-09-17 DIAGNOSIS — R7989 Other specified abnormal findings of blood chemistry: Secondary | ICD-10-CM

## 2016-09-17 DIAGNOSIS — Z9049 Acquired absence of other specified parts of digestive tract: Secondary | ICD-10-CM

## 2016-09-17 DIAGNOSIS — B961 Klebsiella pneumoniae [K. pneumoniae] as the cause of diseases classified elsewhere: Secondary | ICD-10-CM | POA: Diagnosis present

## 2016-09-17 DIAGNOSIS — N179 Acute kidney failure, unspecified: Secondary | ICD-10-CM | POA: Diagnosis present

## 2016-09-17 DIAGNOSIS — Z8673 Personal history of transient ischemic attack (TIA), and cerebral infarction without residual deficits: Secondary | ICD-10-CM

## 2016-09-17 DIAGNOSIS — Z7901 Long term (current) use of anticoagulants: Secondary | ICD-10-CM

## 2016-09-17 DIAGNOSIS — J111 Influenza due to unidentified influenza virus with other respiratory manifestations: Secondary | ICD-10-CM | POA: Diagnosis present

## 2016-09-17 DIAGNOSIS — E854 Organ-limited amyloidosis: Secondary | ICD-10-CM | POA: Diagnosis present

## 2016-09-17 DIAGNOSIS — R079 Chest pain, unspecified: Secondary | ICD-10-CM | POA: Diagnosis not present

## 2016-09-17 DIAGNOSIS — R109 Unspecified abdominal pain: Secondary | ICD-10-CM

## 2016-09-17 DIAGNOSIS — Z66 Do not resuscitate: Secondary | ICD-10-CM | POA: Diagnosis not present

## 2016-09-17 DIAGNOSIS — Z882 Allergy status to sulfonamides status: Secondary | ICD-10-CM

## 2016-09-17 DIAGNOSIS — I959 Hypotension, unspecified: Secondary | ICD-10-CM | POA: Diagnosis not present

## 2016-09-17 DIAGNOSIS — L89152 Pressure ulcer of sacral region, stage 2: Secondary | ICD-10-CM | POA: Diagnosis present

## 2016-09-17 DIAGNOSIS — Z888 Allergy status to other drugs, medicaments and biological substances status: Secondary | ICD-10-CM

## 2016-09-17 DIAGNOSIS — D696 Thrombocytopenia, unspecified: Secondary | ICD-10-CM | POA: Diagnosis present

## 2016-09-17 LAB — CBC WITH DIFFERENTIAL/PLATELET
BASOS ABS: 0 10*3/uL (ref 0.0–0.1)
BASOS PCT: 0 %
Eosinophils Absolute: 0 10*3/uL (ref 0.0–0.7)
Eosinophils Relative: 0 %
HCT: 43.1 % (ref 39.0–52.0)
HEMOGLOBIN: 14.2 g/dL (ref 13.0–17.0)
LYMPHS PCT: 25 %
Lymphs Abs: 1.7 10*3/uL (ref 0.7–4.0)
MCH: 27.8 pg (ref 26.0–34.0)
MCHC: 32.9 g/dL (ref 30.0–36.0)
MCV: 84.3 fL (ref 78.0–100.0)
MONO ABS: 0.6 10*3/uL (ref 0.1–1.0)
MONOS PCT: 8 %
NEUTROS ABS: 4.7 10*3/uL (ref 1.7–7.7)
Neutrophils Relative %: 67 %
Platelets: 202 10*3/uL (ref 150–400)
RBC: 5.11 MIL/uL (ref 4.22–5.81)
RDW: 16.2 % — AB (ref 11.5–15.5)
WBC: 7 10*3/uL (ref 4.0–10.5)

## 2016-09-17 LAB — COMPREHENSIVE METABOLIC PANEL
ALBUMIN: 3.5 g/dL (ref 3.5–5.0)
ALT: 35 U/L (ref 17–63)
ANION GAP: 11 (ref 5–15)
AST: 46 U/L — ABNORMAL HIGH (ref 15–41)
Alkaline Phosphatase: 87 U/L (ref 38–126)
BUN: 16 mg/dL (ref 6–20)
CO2: 23 mmol/L (ref 22–32)
Calcium: 9.4 mg/dL (ref 8.9–10.3)
Chloride: 104 mmol/L (ref 101–111)
Creatinine, Ser: 1.29 mg/dL — ABNORMAL HIGH (ref 0.61–1.24)
GFR calc non Af Amer: 49 mL/min — ABNORMAL LOW (ref >60)
GFR, EST AFRICAN AMERICAN: 57 mL/min — AB (ref >60)
GLUCOSE: 134 mg/dL — AB (ref 65–99)
POTASSIUM: 4.4 mmol/L (ref 3.5–5.1)
SODIUM: 138 mmol/L (ref 135–145)
TOTAL PROTEIN: 6.9 g/dL (ref 6.5–8.1)
Total Bilirubin: 1.8 mg/dL — ABNORMAL HIGH (ref 0.3–1.2)

## 2016-09-17 LAB — POC OCCULT BLOOD, ED: FECAL OCCULT BLD: NEGATIVE

## 2016-09-17 LAB — LIPASE, BLOOD: Lipase: 101 U/L — ABNORMAL HIGH (ref 11–51)

## 2016-09-17 LAB — I-STAT CG4 LACTIC ACID, ED: LACTIC ACID, VENOUS: 2.32 mmol/L — AB (ref 0.5–1.9)

## 2016-09-17 LAB — TROPONIN I: TROPONIN I: 0.05 ng/mL — AB (ref <0.03)

## 2016-09-17 MED ORDER — MORPHINE SULFATE (PF) 4 MG/ML IV SOLN
2.0000 mg | Freq: Once | INTRAVENOUS | Status: AC
  Administered 2016-09-17: 2 mg via INTRAVENOUS
  Filled 2016-09-17: qty 1

## 2016-09-17 MED ORDER — IOPAMIDOL (ISOVUE-370) INJECTION 76%
INTRAVENOUS | Status: AC
  Filled 2016-09-17: qty 100

## 2016-09-17 NOTE — ED Notes (Signed)
RN attempted to get urine from pt, was informed by pt that MD stated he did not need it.  Verified w/ Dr. Winfred Leeds

## 2016-09-17 NOTE — ED Notes (Signed)
Attempted to give report, Verdene Lennert RN informed RN that she needed to check w/ charge RN and would call RN back.

## 2016-09-17 NOTE — H&P (Addendum)
History and Physical    Gregory [name] [id number] DOB: 12-May-1930 DOA: 09/17/2016  PCP: [name], MD  Patient coming from: Home  Chief Complaint: chest and abdominal pain  HPI: Gregory [name] is a 83 y.o. male with medical history significant of stroke in 2015, STEMI in 2017, prostate Ca, pAF, HTN, h/o stomach ulcers and GIB, CAD who presents for lower chest pain and diffuse abdominal.  He reports that the pain started the morning of admission.  It was in the epigastrium and also in the suprapubic area.  It felt sort of like a burning, but not exactly.  It was not squeezing or sharp, it "just hurt."  It was better with morphine.  He is not sure what makes it worse, but did note that water made him vomit while in the ED.  He denies worsening with food, movement of the pain or worsening of the pain with movement.  He reports that since his hernia surgery he does not move around much.  He denies SOB or diaphoresis with the pain.  He did have vomiting and nausea today and vomited 2X with food and 4-5X where it was just mucus.  No bile or blood, but he did say that one time he saw orange/red liquid.  He ate chicken and dumplings yesterday, he is not sure if this is related.  He denies fever, chills, dysuria, hematuria, bloating, diarrhea or constipation.  The chest pain is right below his ribs, so I believe this is more epigastric in nature.   ED Course: In the ED, his pain was relieved with morphine.  He did have emesis X 1 with water.  He has not tried PO intake since that time. He was found to have a BUN of 16, Cr of 1.29 (above baseline), lipase of 101, AST 46, ALP 87, T. Bili 1.8.  L.A was 2.32 initially.  FOBT negative.  Initial TnI 0.05, up from 0.02 at the time of his STEMI.  He had a RUQ [place] which showed cholelithiasis and GB sludge, no cholecystitis.  By the time I saw him, he was pain free.    Review of Systems: As per HPI otherwise 10 point review of systems negative.   Past Medical  History:  Diagnosis Date  . Arthritis    "legs, knees" (08/05/2016)  . CAD (coronary artery disease)    a. cath 03/2016: diffuse disease w/ 50% ostial LM stenosis, 75% ostial Cx, 50% 3rd Mrg, 65% distal RCA, and 50% RPDA. Medical management recommended.  Marland Kitchen GIB (gastrointestinal bleeding)    a. occurred in 2016, secondary to gastritis and diverticulosis  . History of gout   . History of stomach ulcers   . Hypercholesteremia   . Hypertension   . Hypertensive heart disease   . LVH (left ventricular hypertrophy)    a. 03/2016 Echo: EF 55-65%, no rwma, Gr1 DD, sev LVH, mildly dil RA/LA, small peric eff;  b. 03/2016 Cardiac MRI: sev LVH, mildly dil RV, possible RV thrombus, diffuse LGE involving RV and diff subendocardial LGE in LV. *No M spike on SPEP, nl immunofixation pattern - no clear evidence of cardiac amyloidosis.  . Murmur, cardiac   . PAF (paroxysmal atrial fibrillation) ([place])    a. new-onset 03/2016, placed on Eliquis (CHA2DS2VASc = 6).  . Pneumonia    "quite a few times" (08/05/2016)  . Poor appetite   . Prostate cancer ([place])   . STEMI (ST elevation myocardial infarction) ([place]) 03/2016   Archie Endo 03/26/2016  .  Stroke [facility])    a. nonhemorrhagic CVA in 2015    Past Surgical History:  Procedure Laterality Date  . CARDIAC CATHETERIZATION N/A 03/26/2016   Procedure: Left Heart Cath and Coronary Angiography;  Surgeon: Troy Sine, MD;  Location: [place] CV LAB;  Service: Cardiovascular;  Laterality: N/A;  . CARDIAC CATHETERIZATION N/A 08/06/2016   Procedure: Pericardiocentesis;  Surgeon: Sherren Mocha, MD;  Location: [place] CV LAB;  Service: Cardiovascular;  Laterality: N/A;  . COLONOSCOPY WITH PROPOFOL N/A 10/04/2014   Procedure: COLONOSCOPY WITH PROPOFOL;  Surgeon: Laurence Spates, MD;  Location: [place];  Service: Endoscopy;  Laterality: N/A;  . DIAGNOSTIC LAPAROSCOPY  08/05/2016   reduction of strangulated small bowel from left inguinal hernia, TAPP repair with  phasix mesh, small bowel resection/notes 08/05/2016  . ESOPHAGOGASTRODUODENOSCOPY N/A 10/03/2014   Procedure: ESOPHAGOGASTRODUODENOSCOPY (EGD);  Surgeon: Winfield Cunas., MD;  Location: [facility] ENDOSCOPY;  Service: Endoscopy;  Laterality: N/A;  . HERNIA REPAIR    . LAPAROSCOPY N/A 08/05/2016   Procedure: LAPAROSCOPY DIAGNOSTIC AND LAPAROSCOPIC INGUINAL HERNIA REPAIR.;  Surgeon: Clovis Riley, MD;  Location: [place];  Service: General;  Laterality: N/A;  . LAPAROTOMY N/A 08/05/2016   Procedure: Anne Fu WITH  SMALL BOWEL RESECTION.;  Surgeon: Clovis Riley, MD;  Location: [place];  Service: General;  Laterality: N/A;  . PROSTATECTOMY  1990s?   Reviewed with patinet  reports that he has never smoked. He has never used smokeless tobacco. He reports that he does not drink alcohol or use drugs.  Reviewed with patient Allergies  Allergen Reactions  . Sulfa Antibiotics     Reaction not noted on MAR  . Ramipril     Reaction not noted on MAR  . Ramipril Other (See Comments)    Reaction not noted on MAR  . Sulfamethoxazole Rash   Confirmed by patient, and he stated he is not really sure of his parents medical histories.  Family History  Problem Relation Age of Onset  . Hypertension Mother      Prior to Admission medications   Medication Sig Start Date End Date Taking? Authorizing Provider  amiodarone (PACERONE) 400 MG tablet Take 1 tablet (400 mg total) by mouth daily. 08/22/16  Yes Ripudeep Krystal Eaton, MD  apixaban (ELIQUIS) 5 MG TABS tablet Take 1 tablet (5 mg total) by mouth 2 (two) times daily. 04/01/16  Yes Erma Heritage, PA  atorvastatin (LIPITOR) 40 MG tablet Take 1 tablet (40 mg total) by mouth daily. 07/20/16 10/18/16 Yes Troy Sine, MD  barrier cream (NON-SPECIFIED) CREA Apply 1 application topically See admin instructions. Two times a day to both buttocks   Yes Historical Provider, MD  bisacodyl (DULCOLAX) 10 MG suppository Place 10 mg rectally once as needed for mild  constipation. IF NO RELIEF FROM MILK OF MAGNESIA   Yes Historical Provider, MD  brimonidine (ALPHAGAN) 0.2 % ophthalmic solution Place 1 drop into both eyes at bedtime.   Yes Historical Provider, MD  dextromethorphan-guaiFENesin (MUCINEX DM) 30-600 MG 12hr tablet Take 1 tablet by mouth every 12 (twelve) hours.   Yes Historical Provider, MD  magnesium hydroxide (MILK OF MAGNESIA) 400 MG/5ML suspension Take 30 mLs by mouth once as needed for mild constipation.   Yes Historical Provider, MD  metoprolol (LOPRESSOR) 100 MG tablet Take 1 tablet (100 mg total) by mouth 2 (two) times daily. 08/22/16  Yes Ripudeep Krystal Eaton, MD  Multiple Vitamin (MULTIVITAMIN WITH MINERALS) TABS tablet Take 1 tablet by mouth daily. 08/22/16  Yes Ripudeep Krystal Eaton, MD  NON FORMULARY "Magic Cup": Two times a day   Yes Historical Provider, MD  NON FORMULARY MedPass: 120 ml's by mouth two times a day   Yes Historical Provider, MD  ondansetron (ZOFRAN-ODT) 4 MG disintegrating tablet Take 1 tablet (4 mg total) by mouth every 6 (six) hours as needed for nausea. Patient taking differently: Take 4 mg by mouth every 6 (six) hours as needed for nausea. DISSOLVE IN THE MOUTH 08/22/16  Yes Ripudeep Krystal Eaton, MD  oseltamivir (TAMIFLU) 30 MG capsule Take 30 mg by mouth daily. FOR 10 DAYS   Yes Historical Provider, MD  pantoprazole (PROTONIX) 40 MG tablet Take 1 tablet (40 mg total) by mouth 2 (two) times daily. 08/22/16  Yes Ripudeep Krystal Eaton, MD  polyethylene glycol (MIRALAX / GLYCOLAX) packet Take 17 g by mouth daily as needed for mild constipation or moderate constipation. 08/22/16  Yes Ripudeep Krystal Eaton, MD  senna (SENOKOT) 8.6 MG TABS tablet Take 1 tablet by mouth daily.   Yes Historical Provider, MD  sertraline (ZOLOFT) 50 MG tablet Take 50 mg by mouth daily.  09/01/16  Yes Hendricks Limes, MD  Sodium Phosphates (RA SALINE ENEMA) 19-7 GM/118ML ENEM Place 1 enema rectally once as needed (for constipation not relieved by Dulcolax suppository). AND CALL DOCTOR I  STILL NO RELIEF AFTER ENEMA   Yes Historical Provider, MD  traMADol (ULTRAM) 50 MG tablet Take 0.5 tablets (25 mg total) by mouth every 8 (eight) hours. (control) Patient taking differently: Take 25 mg by mouth every 8 (eight) hours as needed (for pain). (control) 08/24/16  Yes Estill Dooms, MD  colchicine 0.6 MG tablet Take 1 tablet (0.6 mg total) by mouth daily. Patient not taking: Reported on 09/17/2016 08/22/16   Ripudeep Krystal Eaton, MD  feeding supplement, ENSURE ENLIVE, (ENSURE ENLIVE) LIQD Take 237 mLs by mouth 2 (two) times daily between meals. Patient not taking: Reported on 09/17/2016 08/22/16   Ripudeep Krystal Eaton, MD  mirtazapine (REMERON SOL-TAB) 15 MG disintegrating tablet Take 0.5 tablets (7.5 mg total) by mouth at bedtime. Patient not taking: Reported on 09/01/2016 08/22/16   Ripudeep Krystal Eaton, MD  oxyCODONE (OXY IR/ROXICODONE) 5 MG immediate release tablet Take 1 tablet (5 mg total) by mouth every 6 (six) hours as needed for severe pain. Patient not taking: Reported on 09/17/2016 08/22/16   Ripudeep Krystal Eaton, MD  senna-docusate (SENOKOT-S) 8.6-50 MG tablet Take 1 tablet by mouth 2 (two) times daily. Patient not taking: Reported on 09/17/2016 08/22/16   Ripudeep Krystal Eaton, MD  Of note, he denies being treated for the flu, but oseltamivir is on his med list from SNF.   Physical Exam: Vitals:   09/17/16 2215 09/17/16 2230 09/17/16 2245 09/17/16 2259  BP: 123/71 121/70 117/70   Pulse: 79 80 81   Resp: 17 19 19    Temp:    97.8 F (36.6 C)  TempSrc:    Oral  SpO2: 98% 99% 100%     Constitutional: NAD, thin elderly man, lying in bed Vitals:   09/17/16 2215 09/17/16 2230 09/17/16 2245 09/17/16 2259  BP: 123/71 121/70 117/70   Pulse: 79 80 81   Resp: 17 19 19    Temp:    97.8 F (36.6 C)  TempSrc:    Oral  SpO2: 98% 99% 100%    Eyes: Muddy sclerae, possibly mild icterus ENMT: Mucous membranes are dry with whitish plaque on tongue. Posterior pharynx clear of any exudate or lesions.  Poor dentition.  No  subungual yellowing.  Respiratory: clear to auscultation bilaterally, no wheezing. Normal respiratory effort. No accessory muscle use.  Cardiovascular: Regular rate and rhythm, no murmurs / rubs / gallops. No extremity edema.  Abdomen: scaphoid,  no tenderness, no masses palpated. No hepatosplenomegaly. Bowel sounds positive.  Musculoskeletal: no clubbing / cyanosis. No joint deformity upper and lower extremities.  no contractures. Normal muscle tone.  Skin: no rashes.  He has some skin breakdown at his gluteal cleft.  Very dry skin.  He has well healed scar inguinally.  No inguinal LAD.  Neurologic:  Sensation intact,  Moving all extremities Psychiatric: judgment and insight appear normal. Alert and oriented x 3. Normal mood.    Labs on Admission: I have personally reviewed following labs and imaging studies  CBC:  Recent Labs Lab 09/17/16 1827  WBC 7.0  NEUTROABS 4.7  HGB 14.2  HCT 43.1  MCV 84.3  PLT 123XX123   Basic Metabolic Panel:  Recent Labs Lab 09/17/16 1827  NA 138  K 4.4  CL 104  CO2 23  GLUCOSE 134*  BUN 16  CREATININE 1.29*  CALCIUM 9.4   GFR: CrCl cannot be calculated (Unknown ideal weight.). Liver Function Tests:  Recent Labs Lab 09/17/16 1827  AST 46*  ALT 35  ALKPHOS 87  BILITOT 1.8*  PROT 6.9  ALBUMIN 3.5    Recent Labs Lab 09/17/16 1827  LIPASE 101*   No results for input(s): AMMONIA in the last 168 hours. Coagulation Profile: No results for input(s): INR, PROTIME in the last 168 hours. Cardiac Enzymes:  Recent Labs Lab 09/17/16 1827  TROPONINI 0.05*   BNP (last 3 results) No results for input(s): PROBNP in the last 8760 hours. HbA1C: No results for input(s): HGBA1C in the last 72 hours. CBG: No results for input(s): GLUCAP in the last 168 hours. Lipid Profile: No results for input(s): CHOL, HDL, LDLCALC, TRIG, CHOLHDL, LDLDIRECT in the last 72 hours. Thyroid Function Tests: No results for input(s): TSH, T4TOTAL, FREET4,  T3FREE, THYROIDAB in the last 72 hours. Anemia Panel: No results for input(s): VITAMINB12, FOLATE, FERRITIN, TIBC, IRON, RETICCTPCT in the last 72 hours. Urine analysis:    Component Value Date/Time   COLORURINE AMBER (A) 08/16/2016 1358   APPEARANCEUR HAZY (A) 08/16/2016 1358   LABSPEC 1.019 08/16/2016 1358   PHURINE 6.0 08/16/2016 1358   GLUCOSEU NEGATIVE 08/16/2016 1358   HGBUR NEGATIVE 08/16/2016 1358   BILIRUBINUR NEGATIVE 08/16/2016 1358   KETONESUR NEGATIVE 08/16/2016 1358   PROTEINUR NEGATIVE 08/16/2016 1358   UROBILINOGEN 4.0 (H) 01/30/2014 1402   NITRITE NEGATIVE 08/16/2016 1358   LEUKOCYTESUR NEGATIVE 08/16/2016 1358   Sepsis Labs: !!!!!!!!!!!!!!!!!!!!!!!!!!!!!!!!!!!!!!!!!!!! @LABRCNTIP (procalcitonin:4,lacticidven:4) )No results found for this or any previous visit (from the past 240 hour(s)).   Radiological Exams on Admission: US Abdomen Limited  Result Date: 09/17/2016 CLINICAL DATA:  Initial valuation for acute abdominal pain. EXAM: US ABDOMEN LIMITED - RIGHT UPPER QUADRANT COMPARISON:  Prior CT from 08/15/2016. FINDINGS: Gallbladder: Few echogenic stones present within the gallbladder lumen, largest of which measured 1.5 cm. Associated gallbladder sludge. Gallbladder wall measured within normal limits at 2.8 mm. No free pericholecystic fluid. No sonographic Murphy sign elicited on exam. Common bile duct: Diameter: 6.6 mm Liver: No focal lesion identified. Within normal limits in parenchymal echogenicity. Left hepatic lobe not well visualized. IMPRESSION: Cholelithiasis with gallbladder sludge. No other sonographic features to suggest acute cholecystitis. No biliary dilatation. Electronically Signed   By: Jeannine Boga M.D.   On:  09/17/2016 21:09    EKG: Independently reviewed. RBBB.    Assessment/Plan Abdominal pain, chest pain - pain seems to be more epigastric and lower abdominal than chest pain, however, given h/o STEMI in the recent past and TnI slightly  above last check, will trend TnI and repeat EKG in the AM.  Will place on telemetry overnight only.  - Lipase elevated, Mild bump in T bili and AST, RUQ [place] with stones, possibly passed a stone.  Given lower abdominal pain would also look at bladder source and bowel source - Abdominal Xray - Repeat UA (last yesterday) - Trend Lactic acid - IVF with NS at 100cc/hr overnight - He is already on protonix, will continue orally - Zofran for nausea - Pain control with oxycodone PRN - if cannot tolerate PO consider changing to IV  - Consider MRCP if concerned about choledocolithiasis or pain recurs - CMET in the AM - NPO  AKI - Mildly worse than previously in January of this year - Likely related to emesis and decreased PO intake - IVF as noted above, trend Cr.     Hypertension - Normotensive today - Continue metoprolol    Hyperlipidemia - Continue atorvastatin    PAF (paroxysmal atrial fibrillation) - Continue metoprolol and eliquis - Telemetry overnight    Chronic diastolic congestive heart failure - Severe hypertrophy on last TTE in December of 2017 - EF preserved - continue good blood pressure control    Adult failure to thrive - Nutrition consult while in house   DVT prophylaxis: Eliquis Code Status: Full Family Communication: He specifically requested that I do not call his family Disposition Plan: Plan return to SNF in 1-2 days Admission status: Observation   Gilles Chiquito MD [facility] Pager [phone]  If 7PM-7AM, please contact night-coverage [email or web address] Password [facility]  09/17/2016, 11:48 PM

## 2016-09-17 NOTE — ED Notes (Signed)
Patient transported to Ultrasound 

## 2016-09-17 NOTE — ED Provider Notes (Addendum)
[place] DEPT Provider Note   CSN: [id number] Arrival date & time: 09/17/16  1716     History   Chief Complaint Chief Complaint  Patient presents with  . Chest Pain  . Abdominal Pain    HPI Gregory [name] is a 83 y.o. male.Complains of anterior chest pain and epigastric pain onset this morning pain is constant and not made better or worse by anything. Pain is mild at present. He denies any cough denies shortness of breath denies fever. Treated with aspirin prior to coming here. Other associated symptoms include vomiting 4 times today. No hematemesis.  HPI  Past Medical History:  Diagnosis Date  . Arthritis    "legs, knees" (08/05/2016)  . CAD (coronary artery disease)    a. cath 03/2016: diffuse disease w/ 50% ostial LM stenosis, 75% ostial Cx, 50% 3rd Mrg, 65% distal RCA, and 50% RPDA. Medical management recommended.  Marland Kitchen GIB (gastrointestinal bleeding)    a. occurred in 2016, secondary to gastritis and diverticulosis  . History of gout   . History of stomach ulcers   . Hypercholesteremia   . Hypertension   . Hypertensive heart disease   . LVH (left ventricular hypertrophy)    a. 03/2016 Echo: EF 55-65%, no rwma, Gr1 DD, sev LVH, mildly dil RA/LA, small peric eff;  b. 03/2016 Cardiac MRI: sev LVH, mildly dil RV, possible RV thrombus, diffuse LGE involving RV and diff subendocardial LGE in LV. *No M spike on SPEP, nl immunofixation pattern - no clear evidence of cardiac amyloidosis.  . Murmur, cardiac   . PAF (paroxysmal atrial fibrillation) ([place])    a. new-onset 03/2016, placed on Eliquis (CHA2DS2VASc = 6).  . Pneumonia    "quite a few times" (08/05/2016)  . Poor appetite   . Prostate cancer ([place])   . STEMI (ST elevation myocardial infarction) ([place]) 03/2016   Archie Endo 03/26/2016  . Stroke [facility])    a. nonhemorrhagic CVA in 2015    Patient Active Problem List   Diagnosis Date Noted  . Adult failure to thrive 08/24/2016  . Persistent atrial fibrillation ([place])   .  Anticoagulated   . Difficulty in walking, not elsewhere classified   . Melanotic stools 08/14/2016  . Postoperative anemia due to acute blood loss 08/14/2016  . Pericardial effusion   . Hypokalemia   . Malnutrition of moderate degree 08/06/2016  . Hypotension   . Cardiac tamponade   . Chronic diastolic congestive heart failure ([place]) 08/05/2016  . Incarcerated inguinal hernia 08/05/2016  . Incarcerated left inguinal hernia 08/05/2016  . Hypercholesteremia   . Hypertensive heart disease   . Coronary artery disease   . LVH (left ventricular hypertrophy)   . PAF (paroxysmal atrial fibrillation) ([place])   . NSTEMI (non-ST elevated myocardial infarction) ([place]) 03/26/2016  . Acute coronary syndrome ([place])   . GI bleed 10/03/2014  . H/O: CVA (cerebrovascular accident)   . CVA (cerebral infarction) 01/30/2014  . Right arm weakness 01/30/2014  . Hypertension 12/05/2010  . Hyperlipidemia 12/05/2010    Past Surgical History:  Procedure Laterality Date  . CARDIAC CATHETERIZATION N/A 03/26/2016   Procedure: Left Heart Cath and Coronary Angiography;  Surgeon: Troy Sine, MD;  Location: [place] CV LAB;  Service: Cardiovascular;  Laterality: N/A;  . CARDIAC CATHETERIZATION N/A 08/06/2016   Procedure: Pericardiocentesis;  Surgeon: Sherren Mocha, MD;  Location: [place] CV LAB;  Service: Cardiovascular;  Laterality: N/A;  . COLONOSCOPY WITH PROPOFOL N/A 10/04/2014   Procedure: COLONOSCOPY WITH PROPOFOL;  Surgeon:  Laurence Spates, MD;  Location: [facility] ENDOSCOPY;  Service: Endoscopy;  Laterality: N/A;  . DIAGNOSTIC LAPAROSCOPY  08/05/2016   reduction of strangulated small bowel from left inguinal hernia, TAPP repair with phasix mesh, small bowel resection/notes 08/05/2016  . ESOPHAGOGASTRODUODENOSCOPY N/A 10/03/2014   Procedure: ESOPHAGOGASTRODUODENOSCOPY (EGD);  Surgeon: Winfield Cunas., MD;  Location: [facility] ENDOSCOPY;  Service: Endoscopy;  Laterality: N/A;  . HERNIA REPAIR    . LAPAROSCOPY N/A  08/05/2016   Procedure: LAPAROSCOPY DIAGNOSTIC AND LAPAROSCOPIC INGUINAL HERNIA REPAIR.;  Surgeon: Clovis Riley, MD;  Location: [place];  Service: General;  Laterality: N/A;  . LAPAROTOMY N/A 08/05/2016   Procedure: Anne Fu WITH  SMALL BOWEL RESECTION.;  Surgeon: Clovis Riley, MD;  Location: [place];  Service: General;  Laterality: N/A;  . PROSTATECTOMY  1990s?       Home Medications    Prior to Admission medications   Medication Sig Start Date End Date Taking? Authorizing Provider  amiodarone (PACERONE) 400 MG tablet Take 1 tablet (400 mg total) by mouth daily. 08/22/16   Ripudeep Krystal Eaton, MD  apixaban (ELIQUIS) 5 MG TABS tablet Take 1 tablet (5 mg total) by mouth 2 (two) times daily. 04/01/16   Erma Heritage, PA  atorvastatin (LIPITOR) 40 MG tablet Take 1 tablet (40 mg total) by mouth daily. 07/20/16 10/18/16  Troy Sine, MD  brimonidine (ALPHAGAN) 0.2 % ophthalmic solution Place 1 drop into both eyes at bedtime.    Historical Provider, MD  colchicine 0.6 MG tablet Take 1 tablet (0.6 mg total) by mouth daily. 08/22/16   Ripudeep Krystal Eaton, MD  feeding supplement, ENSURE ENLIVE, (ENSURE ENLIVE) LIQD Take 237 mLs by mouth 2 (two) times daily between meals. 08/22/16   Ripudeep Krystal Eaton, MD  metoprolol (LOPRESSOR) 100 MG tablet Take 1 tablet (100 mg total) by mouth 2 (two) times daily. 08/22/16   Ripudeep Krystal Eaton, MD  mirtazapine (REMERON SOL-TAB) 15 MG disintegrating tablet Take 0.5 tablets (7.5 mg total) by mouth at bedtime. Patient not taking: Reported on 09/01/2016 08/22/16   Ripudeep Krystal Eaton, MD  Multiple Vitamin (MULTIVITAMIN WITH MINERALS) TABS tablet Take 1 tablet by mouth daily. 08/22/16   Ripudeep Krystal Eaton, MD  ondansetron (ZOFRAN-ODT) 4 MG disintegrating tablet Take 1 tablet (4 mg total) by mouth every 6 (six) hours as needed for nausea. 08/22/16   Ripudeep Krystal Eaton, MD  oxyCODONE (OXY IR/ROXICODONE) 5 MG immediate release tablet Take 1 tablet (5 mg total) by mouth every 6 (six) hours as  needed for severe pain. 08/22/16   Ripudeep Krystal Eaton, MD  pantoprazole (PROTONIX) 40 MG tablet Take 1 tablet (40 mg total) by mouth 2 (two) times daily. 08/22/16   Ripudeep Krystal Eaton, MD  polyethylene glycol (MIRALAX / GLYCOLAX) packet Take 17 g by mouth daily as needed for mild constipation or moderate constipation. 08/22/16   Ripudeep Krystal Eaton, MD  senna-docusate (SENOKOT-S) 8.6-50 MG tablet Take 1 tablet by mouth 2 (two) times daily. 08/22/16   Ripudeep Krystal Eaton, MD  sertraline (ZOLOFT) 50 MG tablet Take 1 tablet (50 mg total) by mouth daily. 09/01/16 dose increased from 25 mg to 50 mg 09/01/16   Hendricks Limes, MD  traMADol (ULTRAM) 50 MG tablet Take 0.5 tablets (25 mg total) by mouth every 8 (eight) hours. (control) 08/24/16   Estill Dooms, MD    Family History Family History  Problem Relation Age of Onset  . Hypertension Mother     Social History Social  History  Substance Use Topics  . Smoking status: Never Smoker  . Smokeless tobacco: Never Used  . Alcohol use Yes     Comment: 08/05/2016 "quit drinking in the 1970s"     Allergies   Sulfa antibiotics; Ramipril; Ramipril; and Sulfamethoxazole   Review of Systems Review of Systems  Constitutional: Negative.   HENT: Negative.   Respiratory: Negative.   Cardiovascular: Positive for chest pain.  Gastrointestinal: Positive for abdominal distention and vomiting.  Musculoskeletal: Negative.   Skin: Negative.   Neurological: Negative.   Psychiatric/Behavioral: Negative.      Physical Exam Updated Vital Signs There were no vitals taken for this visit.  Physical Exam  Constitutional: He appears distressed.  Chronically ill-appearing alert appears mildly uncomfortable.  HENT:  Head: Normocephalic and atraumatic.  Eyes: Conjunctivae are normal. Pupils are equal, round, and reactive to light.  Neck: Neck supple. No tracheal deviation present. No thyromegaly present.  Cardiovascular: Normal rate and regular rhythm.   No murmur  heard. Pulmonary/Chest: Effort normal and breath sounds normal.  Abdominal: Soft. Bowel sounds are normal. He exhibits no distension. There is no tenderness.  Genitourinary: Rectum normal and penis normal.  Genitourinary Comments: Soft brown stool Hemoccult negative  Musculoskeletal: Normal range of motion. He exhibits no edema or tenderness.  Neurological: He is alert. Coordination normal.  Skin: Skin is warm and dry. No rash noted.  Psychiatric: He has a normal mood and affect.  Nursing note and vitals reviewed.    ED Treatments / Results  Labs (all labs ordered are listed, but only abnormal results are displayed) Labs Reviewed - No data to display  EKG  EKG Interpretation  Date/Time:  Friday September 17 2016 17:30:28 EST Ventricular Rate:  92 PR Interval:    QRS Duration: 153 QT Interval:  424 QTC Calculation: 525 R Axis:   -128 Text Interpretation:  Sinus rhythm Right bundle branch block Inferior infarct, old Lateral leads are also involved No significant change since last tracing Confirmed by Winfred Leeds  MD, Dantonio Justen [phone]) on 09/17/2016 5:42:22 PM      Results for orders placed or performed during the hospital encounter of 09/17/16  Comprehensive metabolic panel  Result Value Ref Range   Sodium 138 135 - 145 mmol/L   Potassium 4.4 3.5 - 5.1 mmol/L   Chloride 104 101 - 111 mmol/L   CO2 23 22 - 32 mmol/L   Glucose, Bld 134 (H) 65 - 99 mg/dL   BUN 16 6 - 20 mg/dL   Creatinine, Ser 1.29 (H) 0.61 - 1.24 mg/dL   Calcium 9.4 8.9 - 10.3 mg/dL   Total Protein 6.9 6.5 - 8.1 g/dL   Albumin 3.5 3.5 - 5.0 g/dL   AST 46 (H) 15 - 41 U/L   ALT 35 17 - 63 U/L   Alkaline Phosphatase 87 38 - 126 U/L   Total Bilirubin 1.8 (H) 0.3 - 1.2 mg/dL   GFR calc non Af Amer 49 (L) >60 mL/min   GFR calc Af Amer 57 (L) >60 mL/min   Anion gap 11 5 - 15  CBC with Differential/Platelet  Result Value Ref Range   WBC 7.0 4.0 - 10.5 K/uL   RBC 5.11 4.22 - 5.81 MIL/uL   Hemoglobin 14.2 13.0 - 17.0  g/dL   HCT 43.1 39.0 - 52.0 %   MCV 84.3 78.0 - 100.0 fL   MCH 27.8 26.0 - 34.0 pg   MCHC 32.9 30.0 - 36.0 g/dL   RDW 16.2 (H) 11.5 -  15.5 %   Platelets 202 150 - 400 K/uL   Neutrophils Relative % 67 %   Neutro Abs 4.7 1.7 - 7.7 K/uL   Lymphocytes Relative 25 %   Lymphs Abs 1.7 0.7 - 4.0 K/uL   Monocytes Relative 8 %   Monocytes Absolute 0.6 0.1 - 1.0 K/uL   Eosinophils Relative 0 %   Eosinophils Absolute 0.0 0.0 - 0.7 K/uL   Basophils Relative 0 %   Basophils Absolute 0.0 0.0 - 0.1 K/uL  Lipase, blood  Result Value Ref Range   Lipase 101 (H) 11 - 51 U/L  Troponin I  Result Value Ref Range   Troponin I 0.05 (HH) <0.03 ng/mL  POC occult blood, ED  Result Value Ref Range   Fecal Occult Bld NEGATIVE NEGATIVE  I-Stat CG4 Lactic Acid, ED  Result Value Ref Range   Lactic Acid, Venous 2.32 (HH) 0.5 - 1.9 mmol/L   Comment NOTIFIED PHYSICIAN    US Abdomen Limited  Result Date: 09/17/2016 CLINICAL DATA:  Initial valuation for acute abdominal pain. EXAM: US ABDOMEN LIMITED - RIGHT UPPER QUADRANT COMPARISON:  Prior CT from 08/15/2016. FINDINGS: Gallbladder: Few echogenic stones present within the gallbladder lumen, largest of which measured 1.5 cm. Associated gallbladder sludge. Gallbladder wall measured within normal limits at 2.8 mm. No free pericholecystic fluid. No sonographic Murphy sign elicited on exam. Common bile duct: Diameter: 6.6 mm Liver: No focal lesion identified. Within normal limits in parenchymal echogenicity. Left hepatic lobe not well visualized. IMPRESSION: Cholelithiasis with gallbladder sludge. No other sonographic features to suggest acute cholecystitis. No biliary dilatation. Electronically Signed   By: Jeannine Boga M.D.   On: 09/17/2016 21:09   Radiology No results found.  Procedures Procedures (including critical care time)  Medications Ordered in ED Medications - No data to display 7:10 PM pain somewhat improved after treatment with intravenous  morphine. Requesting additional pain medicine. Additional IV morphine ordered 10 PM patient asymptomatic pain-free. Hospitalist consulted and will arrange for admission. To fill the patient has surgical abdomen. He may need GI consultation and ultimately may be in need of cholecystectomy Hospitalist service consulted Dr Daryll Drown and will see patient in ED  Initial Impression / Assessment and Plan / ED Course  I have reviewed the triage vital signs and the nursing notes.  Pertinent labs & imaging results that were available during my care of the patient were reviewed by me and considered in my medical decision making (see chart for details).     Patient was vomiting upon my arrival into the exam room however after vomiting yellowish material denied nausea  Final Clinical Impressions(s) / ED Diagnoses  Diagnosis #1 acute pancreatitis Final diagnoses:  None    New Prescriptions New Prescriptions   No medications on file     Orlie Dakin, MD 09/17/16 [place], MD 09/18/16 [phone]

## 2016-09-17 NOTE — ED Notes (Signed)
Admitting provider bedside 

## 2016-09-17 NOTE — ED Triage Notes (Signed)
Pt from [facility]. Per EMS: Pt complaining of chest pain and abdominal pain x 1 day. Pt received 324 ASA, no nitro. Pt states 1 episode emesis today.

## 2016-09-18 ENCOUNTER — Observation Stay (HOSPITAL_COMMUNITY): Payer: Medicare Other

## 2016-09-18 DIAGNOSIS — E43 Unspecified severe protein-calorie malnutrition: Secondary | ICD-10-CM | POA: Diagnosis not present

## 2016-09-18 DIAGNOSIS — R1010 Upper abdominal pain, unspecified: Secondary | ICD-10-CM

## 2016-09-18 DIAGNOSIS — R627 Adult failure to thrive: Secondary | ICD-10-CM | POA: Diagnosis not present

## 2016-09-18 DIAGNOSIS — R7881 Bacteremia: Secondary | ICD-10-CM | POA: Diagnosis not present

## 2016-09-18 DIAGNOSIS — L8990 Pressure ulcer of unspecified site, unspecified stage: Secondary | ICD-10-CM | POA: Insufficient documentation

## 2016-09-18 DIAGNOSIS — R7989 Other specified abnormal findings of blood chemistry: Secondary | ICD-10-CM | POA: Diagnosis not present

## 2016-09-18 DIAGNOSIS — I480 Paroxysmal atrial fibrillation: Secondary | ICD-10-CM | POA: Diagnosis not present

## 2016-09-18 DIAGNOSIS — K8050 Calculus of bile duct without cholangitis or cholecystitis without obstruction: Secondary | ICD-10-CM | POA: Diagnosis not present

## 2016-09-18 DIAGNOSIS — R109 Unspecified abdominal pain: Secondary | ICD-10-CM | POA: Diagnosis not present

## 2016-09-18 LAB — URINALYSIS, ROUTINE W REFLEX MICROSCOPIC
BILIRUBIN URINE: NEGATIVE
Glucose, UA: NEGATIVE mg/dL
HGB URINE DIPSTICK: NEGATIVE
KETONES UR: 5 mg/dL — AB
Leukocytes, UA: NEGATIVE
Nitrite: NEGATIVE
PROTEIN: NEGATIVE mg/dL
Specific Gravity, Urine: 1.021 (ref 1.005–1.030)
pH: 5 (ref 5.0–8.0)

## 2016-09-18 LAB — LACTIC ACID, PLASMA
LACTIC ACID, VENOUS: 2.1 mmol/L — AB (ref 0.5–1.9)
LACTIC ACID, VENOUS: 1.6 mmol/L (ref 0.5–1.9)

## 2016-09-18 LAB — TROPONIN I
TROPONIN I: 0.04 ng/mL — AB (ref <0.03)
TROPONIN I: 0.05 ng/mL — AB (ref <0.03)

## 2016-09-18 LAB — COMPREHENSIVE METABOLIC PANEL
ALT: 269 U/L — ABNORMAL HIGH (ref 17–63)
ANION GAP: 7 (ref 5–15)
AST: 392 U/L — ABNORMAL HIGH (ref 15–41)
Albumin: 2.6 g/dL — ABNORMAL LOW (ref 3.5–5.0)
Alkaline Phosphatase: 163 U/L — ABNORMAL HIGH (ref 38–126)
BUN: 14 mg/dL (ref 6–20)
CO2: 25 mmol/L (ref 22–32)
Calcium: 8.8 mg/dL — ABNORMAL LOW (ref 8.9–10.3)
Chloride: 107 mmol/L (ref 101–111)
Creatinine, Ser: 1.18 mg/dL (ref 0.61–1.24)
GFR calc non Af Amer: 55 mL/min — ABNORMAL LOW (ref >60)
Glucose, Bld: 104 mg/dL — ABNORMAL HIGH (ref 65–99)
Potassium: 4.5 mmol/L (ref 3.5–5.1)
SODIUM: 139 mmol/L (ref 135–145)
Total Bilirubin: 2.2 mg/dL — ABNORMAL HIGH (ref 0.3–1.2)
Total Protein: 5.6 g/dL — ABNORMAL LOW (ref 6.5–8.1)

## 2016-09-18 LAB — PROTIME-INR
INR: 2.02
PROTHROMBIN TIME: 23.2 s — AB (ref 11.4–15.2)

## 2016-09-18 LAB — CBC
HCT: 38.4 % — ABNORMAL LOW (ref 39.0–52.0)
HEMOGLOBIN: 12.7 g/dL — AB (ref 13.0–17.0)
MCH: 27.8 pg (ref 26.0–34.0)
MCHC: 33.1 g/dL (ref 30.0–36.0)
MCV: 84 fL (ref 78.0–100.0)
Platelets: 108 10*3/uL — ABNORMAL LOW (ref 150–400)
RBC: 4.57 MIL/uL (ref 4.22–5.81)
RDW: 16.7 % — ABNORMAL HIGH (ref 11.5–15.5)
WBC: 16.1 10*3/uL — AB (ref 4.0–10.5)

## 2016-09-18 LAB — SURGICAL PCR SCREEN
MRSA, PCR: NEGATIVE
STAPHYLOCOCCUS AUREUS: POSITIVE — AB

## 2016-09-18 MED ORDER — ENSURE ENLIVE PO LIQD
237.0000 mL | Freq: Three times a day (TID) | ORAL | Status: DC
  Administered 2016-09-18 – 2016-09-22 (×4): 237 mL via ORAL

## 2016-09-18 MED ORDER — SENNA 8.6 MG PO TABS
1.0000 | ORAL_TABLET | Freq: Every day | ORAL | Status: DC
  Administered 2016-09-19 – 2016-09-20 (×2): 8.6 mg via ORAL
  Filled 2016-09-18 (×4): qty 1

## 2016-09-18 MED ORDER — WHITE PETROLATUM GEL
Status: AC
  Filled 2016-09-18: qty 1

## 2016-09-18 MED ORDER — BRIMONIDINE TARTRATE 0.2 % OP SOLN
1.0000 [drp] | Freq: Every day | OPHTHALMIC | Status: DC
  Administered 2016-09-18 – 2016-09-22 (×5): 1 [drp] via OPHTHALMIC
  Filled 2016-09-18: qty 5

## 2016-09-18 MED ORDER — AMIODARONE HCL 100 MG PO TABS
400.0000 mg | ORAL_TABLET | Freq: Every day | ORAL | Status: DC
  Administered 2016-09-19 – 2016-09-22 (×4): 400 mg via ORAL
  Filled 2016-09-18 (×4): qty 4

## 2016-09-18 MED ORDER — ONDANSETRON 4 MG PO TBDP: 4.0000 mg | ORAL_TABLET | Freq: Four times a day (QID) | ORAL | Status: DC | PRN

## 2016-09-18 MED ORDER — SODIUM CHLORIDE 0.9 % IV SOLN
INTRAVENOUS | Status: AC
  Administered 2016-09-18: 100 mL/h via INTRAVENOUS

## 2016-09-18 MED ORDER — APIXABAN 5 MG PO TABS
5.0000 mg | ORAL_TABLET | Freq: Two times a day (BID) | ORAL | Status: DC
  Administered 2016-09-18: 5 mg via ORAL
  Filled 2016-09-18: qty 1

## 2016-09-18 MED ORDER — APIXABAN 2.5 MG PO TABS
2.5000 mg | ORAL_TABLET | Freq: Two times a day (BID) | ORAL | Status: DC
  Administered 2016-09-20 – 2016-09-22 (×5): 2.5 mg via ORAL
  Filled 2016-09-18 (×8): qty 1

## 2016-09-18 MED ORDER — MUPIROCIN 2 % EX OINT
1.0000 "application " | TOPICAL_OINTMENT | Freq: Two times a day (BID) | CUTANEOUS | Status: DC
  Administered 2016-09-18 – 2016-09-22 (×6): 1 via NASAL
  Filled 2016-09-18: qty 22

## 2016-09-18 MED ORDER — CHLORHEXIDINE GLUCONATE CLOTH 2 % EX PADS
6.0000 | MEDICATED_PAD | Freq: Every day | CUTANEOUS | Status: DC
  Administered 2016-09-18 – 2016-09-21 (×4): 6 via TOPICAL

## 2016-09-18 MED ORDER — PANTOPRAZOLE SODIUM 40 MG PO TBEC
40.0000 mg | DELAYED_RELEASE_TABLET | Freq: Two times a day (BID) | ORAL | Status: DC
  Administered 2016-09-18 – 2016-09-22 (×8): 40 mg via ORAL
  Filled 2016-09-18 (×9): qty 1

## 2016-09-18 MED ORDER — SODIUM CHLORIDE 0.9 % IV SOLN
INTRAVENOUS | Status: DC
  Administered 2016-09-18 – 2016-09-20 (×2): via INTRAVENOUS
  Filled 2016-09-18 (×13): qty 1000

## 2016-09-18 MED ORDER — SERTRALINE HCL 50 MG PO TABS
50.0000 mg | ORAL_TABLET | Freq: Every day | ORAL | Status: DC
Start: 2016-09-18 — End: 2016-09-23
  Administered 2016-09-19 – 2016-09-20 (×2): 50 mg via ORAL
  Filled 2016-09-18 (×4): qty 1

## 2016-09-18 MED ORDER — OXYCODONE HCL 5 MG PO TABS
5.0000 mg | ORAL_TABLET | Freq: Four times a day (QID) | ORAL | Status: DC | PRN
  Administered 2016-09-19 – 2016-09-20 (×3): 5 mg via ORAL
  Filled 2016-09-18 (×4): qty 1

## 2016-09-18 MED ORDER — METOPROLOL TARTRATE 100 MG PO TABS
100.0000 mg | ORAL_TABLET | Freq: Two times a day (BID) | ORAL | Status: DC
  Administered 2016-09-18 – 2016-09-22 (×7): 100 mg via ORAL
  Filled 2016-09-18 (×9): qty 1

## 2016-09-18 MED ORDER — SODIUM CHLORIDE 0.9 % IV SOLN: INTRAVENOUS | Status: DC

## 2016-09-18 MED ORDER — ADULT MULTIVITAMIN W/MINERALS CH
1.0000 | ORAL_TABLET | Freq: Every day | ORAL | Status: DC
  Administered 2016-09-19 – 2016-09-20 (×2): 1 via ORAL
  Filled 2016-09-18 (×4): qty 1

## 2016-09-18 MED ORDER — POLYETHYLENE GLYCOL 3350 17 G PO PACK: 17.0000 g | PACK | Freq: Every day | ORAL | Status: DC | PRN

## 2016-09-18 MED ORDER — OSELTAMIVIR PHOSPHATE 30 MG PO CAPS
30.0000 mg | ORAL_CAPSULE | Freq: Every day | ORAL | Status: DC
  Filled 2016-09-18: qty 1

## 2016-09-18 MED ORDER — ATORVASTATIN CALCIUM 40 MG PO TABS
40.0000 mg | ORAL_TABLET | Freq: Every day | ORAL | Status: DC
  Administered 2016-09-20 – 2016-09-21 (×2): 40 mg via ORAL
  Filled 2016-09-18 (×4): qty 1

## 2016-09-18 MED ORDER — OSELTAMIVIR PHOSPHATE 30 MG PO CAPS
30.0000 mg | ORAL_CAPSULE | Freq: Two times a day (BID) | ORAL | Status: DC
Start: 2016-09-18 — End: 2016-09-23
  Administered 2016-09-18 – 2016-09-21 (×6): 30 mg via ORAL
  Filled 2016-09-18 (×10): qty 1

## 2016-09-18 NOTE — Progress Notes (Signed)
Patient ID: Gregory [name], male   DOB: [birth year], 83 y.o.   MRN: [id number]  PROGRESS NOTE    Gregory [name]  [id number] DOB: 1930-01-15 DOA: 09/17/2016  PCP: [name], MD   Brief Narrative:   83 y.o. male with medical history significant for STEMI in 2017, prostate Ca, paroxysmal atrial fibrillation on apixaban, hypertension, gastric ulcers and GI bleed, CAD, CVA in 2015 (left cerebral hemisphere CVA on MRI)  Of note, previous hospitalization from 08/05/2016 --> 08/22/2016 for incarcerated hernia and underwent surgery 08/05/2016 and then developed large pericardial effusion with evidence of tamponade and he underwent pericardiocentesis 08/06/2016. Then he developed afib and was started on BB and amiodarone. Drain was finally removed 12/24 and 2 D ECHO later 12/29 showed moderate effusion without tamponade. Please note that during that hospitalization palliative care was also consulted for goals of care and was supposed to continue to follow while pt in SNF.  Pt presented to [facility] with chest and abdominal pain, initially felt as burning type of pain and it was 7-8/10 in intensity, intermittent and radiating to suprapubic area. He did not tolerate much PO intake and even sips of water made him nauseous.  He reported 2 episodes of non bloody vomiting. Pt reported pain was better after morphine.  BP on admission was 101/55, HR 91, normal temp, oxygen saturation was 98-100% on room air. Blood work was notable for Cr of 1.29 , lipase was 101, AST was 46 and repeat number 392, ALT was WNL and then 269, ALP was normal and repeat lab 163 and Bilirubin was 1.8 --> 2.2. Initial troponin was 0.05, lactic acid 2.32. FOBT was negative. He had a RUQ [place] which showed cholelithiasis and GB sludge, no cholecystitis.   Assessment & Plan:   Active Problems: Abdominal pain /  Abnormal LFT's - Unclear etiology  - LFT's initially WNL and then up AST 392, ALT 269, ALP 163 and Bili 2.2 - No acute findings on  abd [place] - No acute findings on abd x ray - Will trend LFT's and if trend is upward in next 24 hours will call GI - Obtain lipase level in am as one today was 101 - Will place order for MRCP - Pain is better this am  Mild troponin elevation - Likely demand ischemia from acute kidney injury versus his history of recent pericardiocentesis  - Trop level flat at 0.05, 0.04 and 0.05 - No chest pain at this time - The 12 lead EKG showed sinus rhythm - Obtain 2 D ECHO just to reassess that pericardial effusion seen on previous admission. He is hemodynamically stable   Acute kidney injury  - Likely prerenal - Cr normalized with IV fluids  PAF (paroxysmal atrial fibrillation) (HCC) - CHADS vasc score 3 - Last 2 D ECHO in 07/2016 showed preserved EF - Rate controlled with amiodarone and metoprolol - Continue anticoagulation with apixaban    Essential hypertension - Continue metoprolol  Dyslipidemia - Continue atorvastatin   Adult failure to thrive / Protein-calorie malnutrition, severe - In the context of acute illness - Seen by nutritionist  Depression - Continue Zoloft   Pressure injury of skin, stage 2 - WOC consult appreciated   Influenza / Leukocytosis / Lactic acidosis / Sepsis  - On Tamiflu prior to admission so just resumed to complete the course - Lactic acid normalized - Blood cx not obtained for some reason on admission but pt did meet sepsis criteria with lactic acidosis, no leukocytosis, tachypnea; will  place order for blood cx today  Thrombocytopenia - Likely in the setting of anticoagulation - No reports of bleed - Follow up CBC I\daily     DVT prophylaxis: on Apixaban  Code Status: full code  Family Communication: no family at the bedside this am Disposition Plan: needs repeat LFT's and MRCP and we can determine if GI consultation is needed; also needs 2 D ECHO    Consultants:   WOC  PT  Procedures:   ECHO - pending   Antimicrobials:   Tamiflu      Subjective: No overnight events.   Objective: Vitals:   09/18/16 0049 09/18/16 0549 09/18/16 1500 09/18/16 1600  BP: 122/68 (!) 101/55 (!) 96/52 103/62  Pulse: 91 68 72   Resp: 18 18 18    Temp: 98.3 F (36.8 C) 98 F (36.7 C) 97.3 F (36.3 C)   TempSrc: Oral Oral Oral   SpO2: 100% 100% 100%   Weight: 59.2 kg (130 lb 8.2 oz)       Intake/Output Summary (Last 24 hours) at 09/18/16 1655 Last data filed at 09/18/16 1100  Gross per 24 hour  Intake           541.67 ml  Output              350 ml  Net           191.67 ml   Filed Weights   09/18/16 0049  Weight: 59.2 kg (130 lb 8.2 oz)    Examination:  General exam: Appears calm and comfortable  Respiratory system: Clear to auscultation. Respiratory effort normal. Cardiovascular system: S1 & S2 heard, Rate controlled  Gastrointestinal system: Abdomen is nondistended, soft and nontender. No organomegaly or masses felt. Normal bowel sounds heard. Central nervous system: Alert and oriented. No focal neurological deficits. Extremities: No swelling, palpable pulses  Skin: Skin is warm and dry  Psychiatry: Judgement and insight appear normal. Mood & affect appropriate.   Data Reviewed: I have personally reviewed following labs and imaging studies  CBC:  Recent Labs Lab 09/17/16 1827 09/18/16 0835  WBC 7.0 16.1*  NEUTROABS 4.7  --   HGB 14.2 12.7*  HCT 43.1 38.4*  MCV 84.3 84.0  PLT 202 [id number]*   Basic Metabolic Panel:  Recent Labs Lab 09/17/16 1827 09/18/16 0835  NA 138 139  K 4.4 4.5  CL 104 107  CO2 23 25  GLUCOSE 134* 104*  BUN 16 14  CREATININE 1.29* 1.18  CALCIUM 9.4 8.8*   GFR: Estimated Creatinine Clearance: 39 mL/min (by C-G formula based on SCr of 1.18 mg/dL). Liver Function Tests:  Recent Labs Lab 09/17/16 1827 09/18/16 0835  AST 46* 392*  ALT 35 269*  ALKPHOS 87 163*  BILITOT 1.8* 2.2*  PROT 6.9 5.6*  ALBUMIN 3.5 2.6*    Recent Labs Lab 09/17/16 1827  LIPASE 101*   No results  for input(s): AMMONIA in the last 168 hours. Coagulation Profile:  Recent Labs Lab 09/18/16 0835  INR 2.02   Cardiac Enzymes:  Recent Labs Lab 09/17/16 1827 09/18/16 0114 09/18/16 0835  TROPONINI 0.05* 0.04* 0.05*   BNP (last 3 results) No results for input(s): PROBNP in the last 8760 hours. HbA1C: No results for input(s): HGBA1C in the last 72 hours. CBG: No results for input(s): GLUCAP in the last 168 hours. Lipid Profile: No results for input(s): CHOL, HDL, LDLCALC, TRIG, CHOLHDL, LDLDIRECT in the last 72 hours. Thyroid Function Tests: No results for input(s): TSH, T4TOTAL, FREET4, T3FREE,  THYROIDAB in the last 72 hours. Anemia Panel: No results for input(s): VITAMINB12, FOLATE, FERRITIN, TIBC, IRON, RETICCTPCT in the last 72 hours. Urine analysis:    Component Value Date/Time   COLORURINE AMBER (A) 09/18/2016 1104   APPEARANCEUR CLEAR 09/18/2016 1104   LABSPEC 1.021 09/18/2016 1104   PHURINE 5.0 09/18/2016 1104   GLUCOSEU NEGATIVE 09/18/2016 1104   HGBUR NEGATIVE 09/18/2016 1104   BILIRUBINUR NEGATIVE 09/18/2016 1104   KETONESUR 5 (A) 09/18/2016 1104   PROTEINUR NEGATIVE 09/18/2016 1104   UROBILINOGEN 4.0 (H) 01/30/2014 1402   NITRITE NEGATIVE 09/18/2016 1104   LEUKOCYTESUR NEGATIVE 09/18/2016 1104   Sepsis Labs: @LABRCNTIP (procalcitonin:4,lacticidven:4)  Surgical pcr screen     Status: Abnormal   Collection Time: 09/18/16  1:02 AM  Result Value Ref Range Status   MRSA, PCR NEGATIVE NEGATIVE Final   Staphylococcus aureus POSITIVE (A) NEGATIVE Final      Radiology Studies: US Abdomen Limited  Result Date: 09/17/2016 Cholelithiasis with gallbladder sludge. No other sonographic features to suggest acute cholecystitis. No biliary dilatation.   Dg Abd 2 Views Result Date: 09/18/2016 Normal bowel gas pattern, no free air. Cholelithiasis.   Dg Abd Portable 1v Result Date: 09/18/2016 Normal bowel gas pattern.  Cholelithiasis.    Scheduled Meds: .  amiodarone  400 mg Oral Daily  . apixaban  2.5 mg Oral BID  . atorvastatin  40 mg Oral Daily  .  (ENSURE ENLIVE)  237 mL Oral TID BM  . metoprolol  100 mg Oral BID  . multivitamin with m  1 tablet Oral Daily  . mupirocin ointment  1 application Nasal BID  . oseltamivir  30 mg Oral BID  . pantoprazole  40 mg Oral BID  . senna  1 tablet Oral Daily  . sertraline  50 mg Oral Daily   Continuous Infusions:   LOS: 0 days    Time spent: 25 minutes  Greater than 50% of the time spent on counseling and coordinating the care.   Leisa Lenz, MD [facility] Pager [phone]  If 7PM-7AM, please contact night-coverage [email or web address] Password [email or web address] 09/18/2016, 4:55 PM

## 2016-09-18 NOTE — Progress Notes (Signed)
Initial Nutrition Assessment  DOCUMENTATION CODES:   Severe malnutrition in context of chronic illness, Underweight  INTERVENTION:  Provide Ensure Enlive po TID, each supplement provides 350 kcal and 20 grams of protein.  Encourage adequate PO intake.   NUTRITION DIAGNOSIS:   Malnutrition (severe) related to chronic illness as evidenced by percent weight loss, severe depletion of body fat, severe depletion of muscle mass.  GOAL:   Patient will meet greater than or equal to 90% of their needs  MONITOR:   PO intake, Supplement acceptance, Labs, Weight trends, Skin, I & O's  REASON FOR ASSESSMENT:   Consult Assessment of nutrition requirement/status  ASSESSMENT:   83 y.o. male with medical history significant of stroke in 2015, STEMI in 2017, prostate Ca, pAF, HTN, h/o stomach ulcers and GIB, CAD who presents for lower chest pain and diffuse abdominal.   Diet has just been advanced to a dysphagia 1 diet with thin liquids. Pt reports consuming at least 2 meals a day PTA with an occasional Ensure/Boost shake. Pt with weight loss. Per weight records, pt with a 12% weight loss in 1 month. When pt asked for reasoning of weight loss he replies that he has not been eating as much as he used to. RD to order Ensure to aid in caloric and protein needs.   Nutrition-Focused physical exam completed. Findings are severe fat depletion, severe muscle depletion, and no edema.   Labs and medications reviewed. Elevated AST, alkaline phosphatase, and ALT. Lipase at 101.   Diet Order:  DIET - DYS 1 Room service appropriate? Yes; Fluid consistency: Thin  Skin:  Wound (see comment) (Stage 2 on buttocks)  Last BM:  Unknown  Height:   Ht Readings from Last 1 Encounters:  08/24/16 5\' 11"  (1.803 m)    Weight:   Wt Readings from Last 1 Encounters:  09/18/16 130 lb 8.2 oz (59.2 kg)    Ideal Body Weight:  78 kg  BMI:  Body mass index is 18.2 kg/m.  Estimated Nutritional Needs:   Kcal:   1700-1850  Protein:  75-85 grams  Fluid:  1.7 - 1.9 L/day  EDUCATION NEEDS:   No education needs identified at this time  Corrin Parker, MS, RD, LDN Pager # [phone] After hours/ weekend pager # [phone]

## 2016-09-18 NOTE — Progress Notes (Signed)
CRITICAL VALUE ALERT  Critical value received:  Lactic Acid 2.1  Date of notification:  09/18/16  Time of notification:  0240  Critical value read back:yes  Nurse who received alert:  Arthor Captain LPN  MD notified (1st page): [facility] Daryll Drown NP  Time of first page: 0246  MD notified (2nd page  Time of second page:  Responding MD:  Peyton Najjar NP  Time MD responded:  [phone]

## 2016-09-19 ENCOUNTER — Other Ambulatory Visit (HOSPITAL_COMMUNITY): Payer: Medicare Other

## 2016-09-19 ENCOUNTER — Observation Stay (HOSPITAL_COMMUNITY): Payer: Medicare Other

## 2016-09-19 DIAGNOSIS — D638 Anemia in other chronic diseases classified elsewhere: Secondary | ICD-10-CM | POA: Diagnosis present

## 2016-09-19 DIAGNOSIS — E854 Organ-limited amyloidosis: Secondary | ICD-10-CM | POA: Diagnosis present

## 2016-09-19 DIAGNOSIS — Z7189 Other specified counseling: Secondary | ICD-10-CM | POA: Diagnosis not present

## 2016-09-19 DIAGNOSIS — I959 Hypotension, unspecified: Secondary | ICD-10-CM | POA: Diagnosis not present

## 2016-09-19 DIAGNOSIS — I10 Essential (primary) hypertension: Secondary | ICD-10-CM

## 2016-09-19 DIAGNOSIS — L89152 Pressure ulcer of sacral region, stage 2: Secondary | ICD-10-CM | POA: Diagnosis present

## 2016-09-19 DIAGNOSIS — I248 Other forms of acute ischemic heart disease: Secondary | ICD-10-CM | POA: Diagnosis present

## 2016-09-19 DIAGNOSIS — I313 Pericardial effusion (noninflammatory): Secondary | ICD-10-CM | POA: Diagnosis present

## 2016-09-19 DIAGNOSIS — R627 Adult failure to thrive: Secondary | ICD-10-CM | POA: Diagnosis not present

## 2016-09-19 DIAGNOSIS — Z515 Encounter for palliative care: Secondary | ICD-10-CM | POA: Diagnosis present

## 2016-09-19 DIAGNOSIS — R7881 Bacteremia: Secondary | ICD-10-CM | POA: Diagnosis not present

## 2016-09-19 DIAGNOSIS — I480 Paroxysmal atrial fibrillation: Secondary | ICD-10-CM | POA: Diagnosis present

## 2016-09-19 DIAGNOSIS — E7800 Pure hypercholesterolemia, unspecified: Secondary | ICD-10-CM | POA: Diagnosis present

## 2016-09-19 DIAGNOSIS — E872 Acidosis: Secondary | ICD-10-CM | POA: Diagnosis present

## 2016-09-19 DIAGNOSIS — R1013 Epigastric pain: Secondary | ICD-10-CM

## 2016-09-19 DIAGNOSIS — R1010 Upper abdominal pain, unspecified: Secondary | ICD-10-CM | POA: Diagnosis not present

## 2016-09-19 DIAGNOSIS — K8020 Calculus of gallbladder without cholecystitis without obstruction: Secondary | ICD-10-CM | POA: Diagnosis present

## 2016-09-19 DIAGNOSIS — B961 Klebsiella pneumoniae [K. pneumoniae] as the cause of diseases classified elsewhere: Secondary | ICD-10-CM | POA: Diagnosis present

## 2016-09-19 DIAGNOSIS — K8070 Calculus of gallbladder and bile duct without cholecystitis without obstruction: Secondary | ICD-10-CM | POA: Diagnosis not present

## 2016-09-19 DIAGNOSIS — K8050 Calculus of bile duct without cholangitis or cholecystitis without obstruction: Secondary | ICD-10-CM | POA: Diagnosis not present

## 2016-09-19 DIAGNOSIS — I5032 Chronic diastolic (congestive) heart failure: Secondary | ICD-10-CM | POA: Diagnosis present

## 2016-09-19 DIAGNOSIS — R079 Chest pain, unspecified: Secondary | ICD-10-CM | POA: Diagnosis present

## 2016-09-19 DIAGNOSIS — K851 Biliary acute pancreatitis without necrosis or infection: Secondary | ICD-10-CM | POA: Diagnosis present

## 2016-09-19 DIAGNOSIS — R7989 Other specified abnormal findings of blood chemistry: Secondary | ICD-10-CM

## 2016-09-19 DIAGNOSIS — I110 Hypertensive heart disease with heart failure: Secondary | ICD-10-CM | POA: Diagnosis present

## 2016-09-19 DIAGNOSIS — R945 Abnormal results of liver function studies: Secondary | ICD-10-CM

## 2016-09-19 DIAGNOSIS — Z66 Do not resuscitate: Secondary | ICD-10-CM | POA: Diagnosis not present

## 2016-09-19 DIAGNOSIS — J111 Influenza due to unidentified influenza virus with other respiratory manifestations: Secondary | ICD-10-CM | POA: Diagnosis present

## 2016-09-19 DIAGNOSIS — D6959 Other secondary thrombocytopenia: Secondary | ICD-10-CM | POA: Diagnosis present

## 2016-09-19 DIAGNOSIS — N179 Acute kidney failure, unspecified: Secondary | ICD-10-CM | POA: Diagnosis present

## 2016-09-19 DIAGNOSIS — I4510 Unspecified right bundle-branch block: Secondary | ICD-10-CM | POA: Diagnosis present

## 2016-09-19 DIAGNOSIS — D696 Thrombocytopenia, unspecified: Secondary | ICD-10-CM | POA: Diagnosis present

## 2016-09-19 DIAGNOSIS — A419 Sepsis, unspecified organism: Secondary | ICD-10-CM | POA: Diagnosis not present

## 2016-09-19 DIAGNOSIS — E43 Unspecified severe protein-calorie malnutrition: Secondary | ICD-10-CM | POA: Diagnosis present

## 2016-09-19 DIAGNOSIS — R109 Unspecified abdominal pain: Secondary | ICD-10-CM | POA: Diagnosis not present

## 2016-09-19 DIAGNOSIS — Z681 Body mass index (BMI) 19 or less, adult: Secondary | ICD-10-CM | POA: Diagnosis not present

## 2016-09-19 DIAGNOSIS — I43 Cardiomyopathy in diseases classified elsewhere: Secondary | ICD-10-CM | POA: Diagnosis present

## 2016-09-19 LAB — CBC WITH DIFFERENTIAL/PLATELET
Basophils Absolute: 0 K/uL (ref 0.0–0.1)
Basophils Relative: 0 %
Eosinophils Absolute: 0 K/uL (ref 0.0–0.7)
Eosinophils Relative: 0 %
HCT: 37.6 % — ABNORMAL LOW (ref 39.0–52.0)
Hemoglobin: 12.3 g/dL — ABNORMAL LOW (ref 13.0–17.0)
Lymphocytes Relative: 14 %
Lymphs Abs: 1.1 K/uL (ref 0.7–4.0)
MCH: 27.6 pg (ref 26.0–34.0)
MCHC: 32.7 g/dL (ref 30.0–36.0)
MCV: 84.5 fL (ref 78.0–100.0)
Monocytes Absolute: 0.7 K/uL (ref 0.1–1.0)
Monocytes Relative: 9 %
Neutro Abs: 5.8 K/uL (ref 1.7–7.7)
Neutrophils Relative %: 77 %
Platelets: 85 K/uL — ABNORMAL LOW (ref 150–400)
RBC: 4.45 MIL/uL (ref 4.22–5.81)
RDW: 16.8 % — ABNORMAL HIGH (ref 11.5–15.5)
WBC: 7.6 K/uL (ref 4.0–10.5)

## 2016-09-19 LAB — BLOOD CULTURE ID PANEL (REFLEXED)
ACINETOBACTER BAUMANNII: NOT DETECTED
Acinetobacter baumannii: NOT DETECTED
CANDIDA ALBICANS: NOT DETECTED
CANDIDA GLABRATA: NOT DETECTED
CANDIDA KRUSEI: NOT DETECTED
CANDIDA TROPICALIS: NOT DETECTED
CARBAPENEM RESISTANCE: NOT DETECTED
Candida albicans: NOT DETECTED
Candida glabrata: NOT DETECTED
Candida krusei: NOT DETECTED
Candida parapsilosis: NOT DETECTED
Candida parapsilosis: NOT DETECTED
Candida tropicalis: NOT DETECTED
Carbapenem resistance: NOT DETECTED
ENTEROBACTER CLOACAE COMPLEX: NOT DETECTED
ENTEROBACTER CLOACAE COMPLEX: NOT DETECTED
ENTEROBACTERIACEAE SPECIES: DETECTED — AB
ESCHERICHIA COLI: NOT DETECTED
Enterobacteriaceae species: DETECTED — AB
Enterococcus species: NOT DETECTED
Enterococcus species: NOT DETECTED
Escherichia coli: NOT DETECTED
HAEMOPHILUS INFLUENZAE: NOT DETECTED
Haemophilus influenzae: NOT DETECTED
KLEBSIELLA OXYTOCA: DETECTED — AB
Klebsiella oxytoca: DETECTED — AB
Klebsiella pneumoniae: NOT DETECTED
Klebsiella pneumoniae: NOT DETECTED
LISTERIA MONOCYTOGENES: NOT DETECTED
Listeria monocytogenes: NOT DETECTED
NEISSERIA MENINGITIDIS: NOT DETECTED
NEISSERIA MENINGITIDIS: NOT DETECTED
PROTEUS SPECIES: NOT DETECTED
PSEUDOMONAS AERUGINOSA: NOT DETECTED
Proteus species: NOT DETECTED
Pseudomonas aeruginosa: NOT DETECTED
SERRATIA MARCESCENS: NOT DETECTED
STAPHYLOCOCCUS AUREUS BCID: NOT DETECTED
STAPHYLOCOCCUS SPECIES: NOT DETECTED
STAPHYLOCOCCUS SPECIES: NOT DETECTED
STREPTOCOCCUS AGALACTIAE: NOT DETECTED
STREPTOCOCCUS AGALACTIAE: NOT DETECTED
STREPTOCOCCUS PNEUMONIAE: NOT DETECTED
STREPTOCOCCUS PYOGENES: NOT DETECTED
STREPTOCOCCUS SPECIES: NOT DETECTED
Serratia marcescens: NOT DETECTED
Staphylococcus aureus (BCID): NOT DETECTED
Streptococcus pneumoniae: NOT DETECTED
Streptococcus pyogenes: NOT DETECTED
Streptococcus species: NOT DETECTED

## 2016-09-19 LAB — COMPREHENSIVE METABOLIC PANEL WITH GFR
ALT: 187 U/L — ABNORMAL HIGH (ref 17–63)
AST: 182 U/L — ABNORMAL HIGH (ref 15–41)
Albumin: 2.5 g/dL — ABNORMAL LOW (ref 3.5–5.0)
Alkaline Phosphatase: 152 U/L — ABNORMAL HIGH (ref 38–126)
Anion gap: 8 (ref 5–15)
BUN: 14 mg/dL (ref 6–20)
CO2: 23 mmol/L (ref 22–32)
Calcium: 8.8 mg/dL — ABNORMAL LOW (ref 8.9–10.3)
Chloride: 107 mmol/L (ref 101–111)
Creatinine, Ser: 0.89 mg/dL (ref 0.61–1.24)
GFR calc Af Amer: >60 mL/min
GFR calc non Af Amer: >60 mL/min
Glucose, Bld: 104 mg/dL — ABNORMAL HIGH (ref 65–99)
Potassium: 4.3 mmol/L (ref 3.5–5.1)
Sodium: 138 mmol/L (ref 135–145)
Total Bilirubin: 2.7 mg/dL — ABNORMAL HIGH (ref 0.3–1.2)
Total Protein: 5.5 g/dL — ABNORMAL LOW (ref 6.5–8.1)

## 2016-09-19 LAB — LIPASE, BLOOD: LIPASE: 58 U/L — AB (ref 11–51)

## 2016-09-19 MED ORDER — METRONIDAZOLE IN NACL 5-0.79 MG/ML-% IV SOLN
500.0000 mg | Freq: Three times a day (TID) | INTRAVENOUS | Status: DC
  Administered 2016-09-19 – 2016-09-22 (×8): 500 mg via INTRAVENOUS
  Filled 2016-09-19 (×10): qty 100

## 2016-09-19 MED ORDER — GADOBENATE DIMEGLUMINE 529 MG/ML IV SOLN
10.0000 mL | Freq: Once | INTRAVENOUS | Status: AC | PRN
  Administered 2016-09-19: 10 mL via INTRAVENOUS

## 2016-09-19 MED ORDER — DEXTROSE 5 % IV SOLN
2.0000 g | INTRAVENOUS | Status: DC
  Administered 2016-09-19 – 2016-09-21 (×3): 2 g via INTRAVENOUS
  Filled 2016-09-19 (×5): qty 2

## 2016-09-19 MED ORDER — PIPERACILLIN-TAZOBACTAM 3.375 G IVPB
3.3750 g | Freq: Three times a day (TID) | INTRAVENOUS | Status: DC
  Filled 2016-09-19: qty 50

## 2016-09-19 NOTE — Progress Notes (Signed)
  PHARMACY - PHYSICIAN COMMUNICATION CRITICAL VALUE ALERT - BLOOD CULTURE IDENTIFICATION (BCID)  Results for orders placed or performed during the hospital encounter of 09/17/16  Blood Culture ID Panel (Reflexed) (Collected: 09/18/2016  8:25 PM)  Result Value Ref Range   Enterococcus species NOT DETECTED NOT DETECTED   Listeria monocytogenes NOT DETECTED NOT DETECTED   Staphylococcus species NOT DETECTED NOT DETECTED   Staphylococcus aureus NOT DETECTED NOT DETECTED   Streptococcus species NOT DETECTED NOT DETECTED   Streptococcus agalactiae NOT DETECTED NOT DETECTED   Streptococcus pneumoniae NOT DETECTED NOT DETECTED   Streptococcus pyogenes NOT DETECTED NOT DETECTED   Acinetobacter baumannii NOT DETECTED NOT DETECTED   Enterobacteriaceae species DETECTED (A) NOT DETECTED   Enterobacter cloacae complex NOT DETECTED NOT DETECTED   Escherichia coli NOT DETECTED NOT DETECTED   Klebsiella oxytoca DETECTED (A) NOT DETECTED   Klebsiella pneumoniae NOT DETECTED NOT DETECTED   Proteus species NOT DETECTED NOT DETECTED   Serratia marcescens NOT DETECTED NOT DETECTED   Carbapenem resistance NOT DETECTED NOT DETECTED   Haemophilus influenzae NOT DETECTED NOT DETECTED   Neisseria meningitidis NOT DETECTED NOT DETECTED   Pseudomonas aeruginosa NOT DETECTED NOT DETECTED   Candida albicans NOT DETECTED NOT DETECTED   Candida glabrata NOT DETECTED NOT DETECTED   Candida krusei NOT DETECTED NOT DETECTED   Candida parapsilosis NOT DETECTED NOT DETECTED   Candida tropicalis NOT DETECTED NOT DETECTED    Name of physician (or Provider) Contacted: Dr. Grandville Silos  Changes to prescribed antibiotics required: 2/2 BCx with Klebsiella Oxytoca - no resistance mechanisms detected. Per discussion with MD - will d/c Zosyn and start Rocephin + Flagyl (IV initially - then to transition to po as able to take orals)  Thank you for allowing pharmacy to be a part of this patient's care.  Alycia Rossetti, PharmD,  BCPS Clinical Pharmacist Pager: [phone] 09/19/2016 2:41 PM

## 2016-09-19 NOTE — Consult Note (Signed)
Consultation Note Date: 09/19/2016   Patient Name: Gregory [name]  DOB: [birth year]  MRN: [id number]  Age / Sex: 83 y.o., male  PCP: [name], MD Referring Physician: Eugenie Filler, MD  Reason for Consultation: Establishing goals of care and Psychosocial/spiritual support  HPI/Patient Profile: 83 y.o. male   admitted on 09/17/2016 with past medical history significant of stroke in 2015, STEMI in 2017, prostate Ca, pAF, HTN, h/o stomach ulcers and GIB, CAD who presents for lower chest pain and diffuse abdominal.   BP on admission was 101/55, HR 91, normal temp, oxygen saturation was 98-100% on room air. Blood work was notable for Cr of 1.29 , lipase was 101, AST was 46 and repeat number 392, ALT was WNL and then 269, ALP was normal and repeat lab 163 and Bilirubin was 1.8 --> 2.2. Initial troponin was 0.05, lactic acid 2.32. FOBT was negative. He had a RUQ [place] which showed cholelithiasis and GB sludge, no cholecystitis. May need GI consult   Previous hospitalization from 08/05/2016 --> 08/22/2016 for incarcerated hernia and underwent surgery 08/05/2016 and then developed large pericardial effusion with evidence of tamponade and he underwent pericardiocentesis 08/06/2016. Then he developed afib and was started on BB and amiodarone. Drain was finally removed 12/24 and 2 D ECHO later 12/29 showed moderate effusion without tamponade.  Patient verbalizes frustration with the events of the last few months, he wants to be a home.   His family tells me they have the resources to have family with him.  He faces advanced directive decisions and anticipatory care needs.  Clinical Assessment and Goals of Care:   This NP Wadie Lessen reviewed medical records, received report from team, assessed the patient and then meet at the patient's bedside and on the telephone with his niece/reported main support person/Ms Alroy Dust   to discuss diagnosis, prognosis, GOC, disposition and options and anticipatory care needs  I stressed with Ms Alroy Dust the importance of family coming together and optimally meeting at the bedside in person to discuss [place] and anticipatory care needs with this patient in person.  She agrees and will try to contact other family members and call me with a time to re-meet.  Pertinent person involved in this patient care: Ronnie/son # [phone]- I left message with this son Ms Mitchell/neice/main support/lives in [place] [phone]   C# [place] [phone]  A discussion was had today regarding advanced directives.  Concepts specific to code status was had.  The difference between a aggressive medical intervention path  and a palliative comfort care path for this patient at this time was had.  Values and goals of care important to patient and family were attempted to be elicited.  Concept of  Palliative Care was discussed   Questions and concerns addressed.   Family encouraged to call with questions or concerns.  PMT will continue to support holistically.   There is no documented HPOA or Advanced Directives    SUMMARY OF RECOMMENDATIONS    Code Status/Advance Care Planning:  Full code-patient is encouraged to contemplate his wishes and verbal to family and document as an AD    Additional Recommendations (Limitations, Scope, Preferences):   At this time treat the treatable, patient is hopeful for improvement and return home  Psycho-social/Spiritual:   Desire for further Chaplaincy support:yes   Prognosis:   Unable to determine  Discharge Planning: To Be Determined      Primary Diagnoses: Present on Admission: . Abdominal pain . Hypertension . Hyperlipidemia . PAF (paroxysmal atrial fibrillation) ([place]) . Chronic diastolic congestive heart failure ([place]) . Adult failure to thrive   I have reviewed the medical record, interviewed the patient  and family, and examined the patient. The following aspects are pertinent.  Past Medical History:  Diagnosis Date  . Arthritis    "legs, knees" (08/05/2016)  . CAD (coronary artery disease)    a. cath 03/2016: diffuse disease w/ 50% ostial LM stenosis, 75% ostial Cx, 50% 3rd Mrg, 65% distal RCA, and 50% RPDA. Medical management recommended.  Marland Kitchen GIB (gastrointestinal bleeding)    a. occurred in 2016, secondary to gastritis and diverticulosis  . History of gout   . History of stomach ulcers   . Hypercholesteremia   . Hypertension   . Hypertensive heart disease   . LVH (left ventricular hypertrophy)    a. 03/2016 Echo: EF 55-65%, no rwma, Gr1 DD, sev LVH, mildly dil RA/LA, small peric eff;  b. 03/2016 Cardiac MRI: sev LVH, mildly dil RV, possible RV thrombus, diffuse LGE involving RV and diff subendocardial LGE in LV. *No M spike on SPEP, nl immunofixation pattern - no clear evidence of cardiac amyloidosis.  . Murmur, cardiac   . PAF (paroxysmal atrial fibrillation) ([place])    a. new-onset 03/2016, placed on Eliquis (CHA2DS2VASc = 6).  . Pneumonia    "quite a few times" (08/05/2016)  . Poor appetite   . Prostate cancer ([place])   . STEMI (ST elevation myocardial infarction) ([place]) 03/2016   Archie Endo 03/26/2016  . Stroke [facility])    a. nonhemorrhagic CVA in 2015   Social History   Social History  . Marital status: Divorced    Spouse name: N/A  . Number of children: N/A  . Years of education: N/A   Social History Main Topics  . Smoking status: Never Smoker  . Smokeless tobacco: Never Used  . Alcohol use No     Comment: 08/05/2016 "quit drinking in the 1970s"  . Drug use: No  . Sexual activity: Yes   Other Topics Concern  . None   Social History Narrative  . None   Family History  Problem Relation Age of Onset  . Hypertension Mother    Scheduled Meds: . amiodarone  400 mg Oral Daily  . apixaban  2.5 mg Oral BID  . atorvastatin  40 mg Oral Daily  . brimonidine  1 drop Both Eyes  QHS  . Chlorhexidine Gluconate Cloth  6 each Topical Daily  . feeding supplement (ENSURE ENLIVE)  237 mL Oral TID BM  . metoprolol  100 mg Oral BID  . multivitamin with minerals  1 tablet Oral Daily  . mupirocin ointment  1 application Nasal BID  . oseltamivir  30 mg Oral BID  . pantoprazole  40 mg Oral BID  . senna  1 tablet Oral Daily  . sertraline  50 mg Oral Daily   Continuous Infusions: . sodium chloride 0.9 % 1,000 mL infusion 30 mL/hr at 09/18/16 2314   PRN Meds:.ondansetron, oxyCODONE, polyethylene glycol Medications  Prior to Admission:  Prior to Admission medications   Medication Sig Start Date End Date Taking? Authorizing Provider  amiodarone (PACERONE) 400 MG tablet Take 1 tablet (400 mg total) by mouth daily. 08/22/16  Yes Ripudeep Krystal Eaton, MD  apixaban (ELIQUIS) 5 MG TABS tablet Take 1 tablet (5 mg total) by mouth 2 (two) times daily. 04/01/16  Yes Erma Heritage, PA  atorvastatin (LIPITOR) 40 MG tablet Take 1 tablet (40 mg total) by mouth daily. 07/20/16 10/18/16 Yes Troy Sine, MD  barrier cream (NON-SPECIFIED) CREA Apply 1 application topically See admin instructions. Two times a day to both buttocks   Yes Historical Provider, MD  bisacodyl (DULCOLAX) 10 MG suppository Place 10 mg rectally once as needed for mild constipation. IF NO RELIEF FROM MILK OF MAGNESIA   Yes Historical Provider, MD  brimonidine (ALPHAGAN) 0.2 % ophthalmic solution Place 1 drop into both eyes at bedtime.   Yes Historical Provider, MD  dextromethorphan-guaiFENesin (MUCINEX DM) 30-600 MG 12hr tablet Take 1 tablet by mouth every 12 (twelve) hours.   Yes Historical Provider, MD  magnesium hydroxide (MILK OF MAGNESIA) 400 MG/5ML suspension Take 30 mLs by mouth once as needed for mild constipation.   Yes Historical Provider, MD  metoprolol (LOPRESSOR) 100 MG tablet Take 1 tablet (100 mg total) by mouth 2 (two) times daily. 08/22/16  Yes Ripudeep Krystal Eaton, MD  Multiple Vitamin (MULTIVITAMIN WITH MINERALS) TABS  tablet Take 1 tablet by mouth daily. 08/22/16  Yes Ripudeep Krystal Eaton, MD  NON FORMULARY "Magic Cup": Two times a day   Yes Historical Provider, MD  NON FORMULARY MedPass: 120 ml's by mouth two times a day   Yes Historical Provider, MD  ondansetron (ZOFRAN-ODT) 4 MG disintegrating tablet Take 1 tablet (4 mg total) by mouth every 6 (six) hours as needed for nausea. Patient taking differently: Take 4 mg by mouth every 6 (six) hours as needed for nausea. DISSOLVE IN THE MOUTH 08/22/16  Yes Ripudeep Krystal Eaton, MD  oseltamivir (TAMIFLU) 30 MG capsule Take 30 mg by mouth daily. FOR 10 DAYS   Yes Historical Provider, MD  pantoprazole (PROTONIX) 40 MG tablet Take 1 tablet (40 mg total) by mouth 2 (two) times daily. 08/22/16  Yes Ripudeep Krystal Eaton, MD  polyethylene glycol (MIRALAX / GLYCOLAX) packet Take 17 g by mouth daily as needed for mild constipation or moderate constipation. 08/22/16  Yes Ripudeep Krystal Eaton, MD  senna (SENOKOT) 8.6 MG TABS tablet Take 1 tablet by mouth daily.   Yes Historical Provider, MD  sertraline (ZOLOFT) 50 MG tablet Take 50 mg by mouth daily.  09/01/16  Yes Hendricks Limes, MD  Sodium Phosphates (RA SALINE ENEMA) 19-7 GM/118ML ENEM Place 1 enema rectally once as needed (for constipation not relieved by Dulcolax suppository). AND CALL DOCTOR I STILL NO RELIEF AFTER ENEMA   Yes Historical Provider, MD  traMADol (ULTRAM) 50 MG tablet Take 0.5 tablets (25 mg total) by mouth every 8 (eight) hours. (control) Patient taking differently: Take 25 mg by mouth every 8 (eight) hours as needed (for pain). (control) 08/24/16  Yes Estill Dooms, MD  colchicine 0.6 MG tablet Take 1 tablet (0.6 mg total) by mouth daily. Patient not taking: Reported on 09/17/2016 08/22/16   Ripudeep Krystal Eaton, MD  feeding supplement, ENSURE ENLIVE, (ENSURE ENLIVE) LIQD Take 237 mLs by mouth 2 (two) times daily between meals. Patient not taking: Reported on 09/17/2016 08/22/16   Ripudeep Krystal Eaton, MD  mirtazapine (REMERON SOL-TAB)  15 MG disintegrating  tablet Take 0.5 tablets (7.5 mg total) by mouth at bedtime. Patient not taking: Reported on 09/01/2016 08/22/16   Ripudeep Krystal Eaton, MD  oxyCODONE (OXY IR/ROXICODONE) 5 MG immediate release tablet Take 1 tablet (5 mg total) by mouth every 6 (six) hours as needed for severe pain. Patient not taking: Reported on 09/17/2016 08/22/16   Ripudeep Krystal Eaton, MD  senna-docusate (SENOKOT-S) 8.6-50 MG tablet Take 1 tablet by mouth 2 (two) times daily. Patient not taking: Reported on 09/17/2016 08/22/16   Ripudeep Krystal Eaton, MD   Allergies  Allergen Reactions  . Sulfa Antibiotics     Reaction not noted on MAR  . Ramipril     Reaction not noted on MAR  . Ramipril Other (See Comments)    Reaction not noted on MAR  . Sulfamethoxazole Rash   Review of Systems  Constitutional: Positive for appetite change and fatigue.  Neurological: Positive for weakness.    Physical Exam  Constitutional: He appears cachectic. He has a sickly appearance.  HENT:  Mouth/Throat: Oropharynx is clear and moist.  Cardiovascular: Normal rate, regular rhythm and normal heart sounds.   Pulmonary/Chest: He has decreased breath sounds in the right lower field and the left lower field.  Neurological: He is alert.  Skin: Skin is warm and dry.    Vital Signs: BP (!) 93/55 (BP Location: Left Arm)   Pulse 65   Temp 97.9 F (36.6 C) (Oral)   Resp 16   Wt 59.2 kg (130 lb 8.2 oz)   SpO2 100%   BMI 18.20 kg/m  Pain Assessment: No/denies pain   Pain Score: 0-No pain   SpO2: SpO2: 100 % O2 Device:SpO2: 100 % O2 Flow Rate: .   IO: Intake/output summary:  Intake/Output Summary (Last 24 hours) at 09/19/16 0936 Last data filed at 09/18/16 1800  Gross per 24 hour  Intake              360 ml  Output              550 ml  Net             -190 ml    LBM: Last BM Date:  (unknown) Baseline Weight: Weight: 59.2 kg (130 lb 8.2 oz) Most recent weight: Weight: 59.2 kg (130 lb 8.2 oz)     Palliative Assessment/Data:  Discussed with Dr  Grandville Silos   Time In: 0730 Time Out: 0845 Time Total: 75 min Greater than 50%  of this time was spent counseling and coordinating care related to the above assessment and plan.  Signed by: Wadie Lessen, NP   Please contact Palliative Medicine Team phone at [phone] for questions and concerns.  For individual provider: See Shea Evans

## 2016-09-19 NOTE — Progress Notes (Signed)
Pharmacy Antibiotic Note  Gregory [name] is a 83 y.o. male admitted on 09/17/2016 and found to have GNR bacteremia. No abx started at admission. Paged MD and will start Zosyn.  GI has been consulted and seems to have some gallstone pancreatitis going on as well so may benefit from added anaerobic coverage. Continues on Tamiflu for recent flu positive. Afebrile, WBC elevated at 16.1. SCr stable, CrCl ~62ml/min.  Plan: Start Zosyn 3.375 gm IV q8h (4 hour infusion) Monitor clinical picture, renal function F/U C&S, abx deescalation / LOT  Weight: 130 lb 8.2 oz (59.2 kg)  Temp (24hrs), Avg:97.7 F (36.5 C), Min:97.3 F (36.3 C), Max:97.9 F (36.6 C)   Recent Labs Lab 09/17/16 1827 09/17/16 1842 09/18/16 0114 09/18/16 0405 09/18/16 0835 09/19/16 0310  WBC 7.0  --   --   --  16.1*  --   CREATININE 1.29*  --   --   --  1.18 0.89  LATICACIDVEN  --  2.32* 2.1* 1.6  --   --     Estimated Creatinine Clearance: 51.7 mL/min (by C-G formula based on SCr of 0.89 mg/dL).    Allergies  Allergen Reactions  . Sulfa Antibiotics     Reaction not noted on MAR  . Ramipril     Reaction not noted on MAR  . Ramipril Other (See Comments)    Reaction not noted on MAR  . Sulfamethoxazole Rash    Antimicrobials this admission: Zosyn 2/4 >>   Dose adjustments this admission: n/a  Microbiology results:  2/3 BCx: GNRs 2/3 Surgical PCR: MSSA  Thank you for allowing pharmacy to be a part of this patient's care.  Elenor Quinones, PharmD, BCPS Clinical Pharmacist Pager [phone] 09/19/2016 2:09 PM

## 2016-09-19 NOTE — Progress Notes (Signed)
PT Cancellation Note  Patient Details Name: Gregory [name] MRN: [id number] DOB: 10/27/1929   Cancelled Treatment:    Reason Eval/Treat Not Completed: Patient declined, no reason specified;Other (comment)Pt reports that he is waiting to go to MRI and does not want to do any therapy before then. Will recheck later this afternoon if time allows.    [name] PT, DPT  [phone]  09/19/2016, 9:58 AM

## 2016-09-19 NOTE — Consult Note (Signed)
Referring Provider: Dr. Grandville Silos Primary Care Physician:  [name], MD Primary Gastroenterologist:  Dr. Oletta Lamas  Reason for Consultation:  Elevated LFTs; pancreatitis  HPI: Gregory [name] is a 83 y.o. male with multiple medical problems as stated below being seen for a consult due to elevated LFTs and pancreatitis. He is s/p an incarcerated left inguinal hernia repair with partial small bowel resection in December 2017 and was discharged on 08/22/16 to a SNF. He was admitted on 09/17/16 with epigastric abdominal pain along with suprapubic pain. He also had lower chest pain on admit. Denies that pain was sharp. N/V several times prior to admit. No relation of the pain with eating. Denies F/C/SOB/diaphoresis. LFTs within normal limits on admit (09/17/16) but they increased to AST 392, ALT 269, TB 2.2, ALP 163 on 09/18/16. LFTs (09/19/16): AST 182, ALT 187, TB 2.7, ALP 152. Lipase elevated at 101 on 09/17/16 and 58 on 09/19/16. WBC 16.1 on 09/18/16. U/S showed gallstones with GB sludge without acute cholecystitis and no biliary dilation. On Eliquis for Afib.    Past Medical History:  Diagnosis Date  . Arthritis    "legs, knees" (08/05/2016)  . CAD (coronary artery disease)    a. cath 03/2016: diffuse disease w/ 50% ostial LM stenosis, 75% ostial Cx, 50% 3rd Mrg, 65% distal RCA, and 50% RPDA. Medical management recommended.  Marland Kitchen GIB (gastrointestinal bleeding)    a. occurred in 2016, secondary to gastritis and diverticulosis  . History of gout   . History of stomach ulcers   . Hypercholesteremia   . Hypertension   . Hypertensive heart disease   . LVH (left ventricular hypertrophy)    a. 03/2016 Echo: EF 55-65%, no rwma, Gr1 DD, sev LVH, mildly dil RA/LA, small peric eff;  b. 03/2016 Cardiac MRI: sev LVH, mildly dil RV, possible RV thrombus, diffuse LGE involving RV and diff subendocardial LGE in LV. *No M spike on SPEP, nl immunofixation pattern - no clear evidence of cardiac amyloidosis.  . Murmur, cardiac    . PAF (paroxysmal atrial fibrillation) ([place])    a. new-onset 03/2016, placed on Eliquis (CHA2DS2VASc = 6).  . Pneumonia    "quite a few times" (08/05/2016)  . Poor appetite   . Prostate cancer ([place])   . STEMI (ST elevation myocardial infarction) ([place]) 03/2016   Archie Endo 03/26/2016  . Stroke [facility])    a. nonhemorrhagic CVA in 2015    Past Surgical History:  Procedure Laterality Date  . CARDIAC CATHETERIZATION N/A 03/26/2016   Procedure: Left Heart Cath and Coronary Angiography;  Surgeon: Troy Sine, MD;  Location: [place] CV [facility];  Service: Cardiovascular;  Laterality: N/A;  . CARDIAC CATHETERIZATION N/A 08/06/2016   Procedure: Pericardiocentesis;  Surgeon: Sherren Mocha, MD;  Location: [place] [facility];  Service: Cardiovascular;  Laterality: N/A;  . COLONOSCOPY WITH PROPOFOL N/A 10/04/2014   Procedure: COLONOSCOPY WITH PROPOFOL;  Surgeon: Laurence Spates, MD;  Location: [place];  Service: Endoscopy;  Laterality: N/A;  . DIAGNOSTIC LAPAROSCOPY  08/05/2016   reduction of strangulated small bowel from left inguinal hernia, TAPP repair with phasix mesh, small bowel resection/notes 08/05/2016  . ESOPHAGOGASTRODUODENOSCOPY N/A 10/03/2014   Procedure: ESOPHAGOGASTRODUODENOSCOPY (EGD);  Surgeon: Winfield Cunas., MD;  Location[facility] ENDOSCOPY;  Service: Endoscopy;  Laterality: N/A;  . HERNIA REPAIR    . LAPAROSCOPY N/A 08/05/2016   Procedure: LAPAROSCOPY DIAGNOSTIC AND LAPAROSCOPIC INGUINAL HERNIA REPAIR.;  Surgeon: Clovis Riley, MD;  Location: [place];  Service: General;  Laterality: N/A;  .  LAPAROTOMY N/A 08/05/2016   Procedure: Anne Fu WITH  SMALL BOWEL RESECTION.;  Surgeon: Clovis Riley, MD;  Location: [place];  Service: General;  Laterality: N/A;  . PROSTATECTOMY  1990s?    Prior to Admission medications   Medication Sig Start Date End Date Taking? Authorizing Provider  amiodarone (PACERONE) 400 MG tablet Take 1 tablet (400 mg total) by mouth daily. 08/22/16   Yes Ripudeep Krystal Eaton, MD  apixaban (ELIQUIS) 5 MG TABS tablet Take 1 tablet (5 mg total) by mouth 2 (two) times daily. 04/01/16  Yes Erma Heritage, PA  atorvastatin (LIPITOR) 40 MG tablet Take 1 tablet (40 mg total) by mouth daily. 07/20/16 10/18/16 Yes Troy Sine, MD  barrier cream (NON-SPECIFIED) CREA Apply 1 application topically See admin instructions. Two times a day to both buttocks   Yes Historical Provider, MD  bisacodyl (DULCOLAX) 10 MG suppository Place 10 mg rectally once as needed for mild constipation. IF NO RELIEF FROM MILK OF MAGNESIA   Yes Historical Provider, MD  brimonidine (ALPHAGAN) 0.2 % ophthalmic solution Place 1 drop into both eyes at bedtime.   Yes Historical Provider, MD  dextromethorphan-guaiFENesin (MUCINEX DM) 30-600 MG 12hr tablet Take 1 tablet by mouth every 12 (twelve) hours.   Yes Historical Provider, MD  magnesium hydroxide (MILK OF MAGNESIA) 400 MG/5ML suspension Take 30 mLs by mouth once as needed for mild constipation.   Yes Historical Provider, MD  metoprolol (LOPRESSOR) 100 MG tablet Take 1 tablet (100 mg total) by mouth 2 (two) times daily. 08/22/16  Yes Ripudeep Krystal Eaton, MD  Multiple Vitamin (MULTIVITAMIN WITH MINERALS) TABS tablet Take 1 tablet by mouth daily. 08/22/16  Yes Ripudeep Krystal Eaton, MD  NON FORMULARY "Magic Cup": Two times a day   Yes Historical Provider, MD  NON FORMULARY MedPass: 120 ml's by mouth two times a day   Yes Historical Provider, MD  ondansetron (ZOFRAN-ODT) 4 MG disintegrating tablet Take 1 tablet (4 mg total) by mouth every 6 (six) hours as needed for nausea. Patient taking differently: Take 4 mg by mouth every 6 (six) hours as needed for nausea. DISSOLVE IN THE MOUTH 08/22/16  Yes Ripudeep Krystal Eaton, MD  oseltamivir (TAMIFLU) 30 MG capsule Take 30 mg by mouth daily. FOR 10 DAYS   Yes Historical Provider, MD  pantoprazole (PROTONIX) 40 MG tablet Take 1 tablet (40 mg total) by mouth 2 (two) times daily. 08/22/16  Yes Ripudeep Krystal Eaton, MD  polyethylene  glycol (MIRALAX / GLYCOLAX) packet Take 17 g by mouth daily as needed for mild constipation or moderate constipation. 08/22/16  Yes Ripudeep Krystal Eaton, MD  senna (SENOKOT) 8.6 MG TABS tablet Take 1 tablet by mouth daily.   Yes Historical Provider, MD  sertraline (ZOLOFT) 50 MG tablet Take 50 mg by mouth daily.  09/01/16  Yes Hendricks Limes, MD  Sodium Phosphates (RA SALINE ENEMA) 19-7 GM/118ML ENEM Place 1 enema rectally once as needed (for constipation not relieved by Dulcolax suppository). AND CALL DOCTOR I STILL NO RELIEF AFTER ENEMA   Yes Historical Provider, MD  traMADol (ULTRAM) 50 MG tablet Take 0.5 tablets (25 mg total) by mouth every 8 (eight) hours. (control) Patient taking differently: Take 25 mg by mouth every 8 (eight) hours as needed (for pain). (control) 08/24/16  Yes Estill Dooms, MD  colchicine 0.6 MG tablet Take 1 tablet (0.6 mg total) by mouth daily. Patient not taking: Reported on 09/17/2016 08/22/16   Ripudeep Krystal Eaton, MD  feeding supplement,  ENSURE ENLIVE, (ENSURE ENLIVE) LIQD Take 237 mLs by mouth 2 (two) times daily between meals. Patient not taking: Reported on 09/17/2016 08/22/16   Ripudeep Krystal Eaton, MD  mirtazapine (REMERON SOL-TAB) 15 MG disintegrating tablet Take 0.5 tablets (7.5 mg total) by mouth at bedtime. Patient not taking: Reported on 09/01/2016 08/22/16   Ripudeep Krystal Eaton, MD  oxyCODONE (OXY IR/ROXICODONE) 5 MG immediate release tablet Take 1 tablet (5 mg total) by mouth every 6 (six) hours as needed for severe pain. Patient not taking: Reported on 09/17/2016 08/22/16   Ripudeep Krystal Eaton, MD  senna-docusate (SENOKOT-S) 8.6-50 MG tablet Take 1 tablet by mouth 2 (two) times daily. Patient not taking: Reported on 09/17/2016 08/22/16   Ripudeep Krystal Eaton, MD    Scheduled Meds: . amiodarone  400 mg Oral Daily  . apixaban  2.5 mg Oral BID  . atorvastatin  40 mg Oral Daily  . brimonidine  1 drop Both Eyes QHS  . Chlorhexidine Gluconate Cloth  6 each Topical Daily  . feeding supplement (ENSURE ENLIVE)   237 mL Oral TID BM  . metoprolol  100 mg Oral BID  . multivitamin with minerals  1 tablet Oral Daily  . mupirocin ointment  1 application Nasal BID  . oseltamivir  30 mg Oral BID  . pantoprazole  40 mg Oral BID  . senna  1 tablet Oral Daily  . sertraline  50 mg Oral Daily   Continuous Infusions: . sodium chloride 0.9 % 1,000 mL infusion 30 mL/hr at 09/18/16 2314   PRN Meds:.ondansetron, oxyCODONE, polyethylene glycol  Allergies as of 09/17/2016 - Review Complete 09/17/2016  Allergen Reaction Noted  . Sulfa antibiotics  12/05/2010  . Ramipril  12/05/2010  . Ramipril Other (See Comments) 12/05/2010  . Sulfamethoxazole Rash 08/06/2011    Family History  Problem Relation Age of Onset  . Hypertension Mother     Social History   Social History  . Marital status: Divorced    Spouse name: N/A  . Number of children: N/A  . Years of education: N/A   Occupational History  . Not on file.   Social History Main Topics  . Smoking status: Never Smoker  . Smokeless tobacco: Never Used  . Alcohol use No     Comment: 08/05/2016 "quit drinking in the 1970s"  . Drug use: No  . Sexual activity: Yes   Other Topics Concern  . Not on file   Social History Narrative  . No narrative on file    Review of Systems: All negative except as stated above in HPI.  Physical Exam: Vital signs: Vitals:   09/18/16 1945 09/19/16 0536  BP: (!) 109/54 (!) 93/55  Pulse: 73 65  Resp: 16 16  Temp: 97.9 F (36.6 C) 97.9 F (36.6 C)   Last BM Date:  (unknown) General:  Elderly, thin, frail, pleasant, no acute distress HEENT: anicteric sclera Lungs:  Clear throughout to auscultation.   No wheezes, crackles, or rhonchi. No acute distress. Heart:  Regular rate and rhythm; no murmurs, clicks, rubs,  or gallops. Abdomen: LLQ tenderness with guarding, otherwise nontender, soft, nondistended, +BS, flat  Rectal:  Deferred Ext: no edema  GI:  Lab Results:  Recent Labs  09/17/16 1827  09/18/16 0835  WBC 7.0 16.1*  HGB 14.2 12.7*  HCT 43.1 38.4*  PLT 202 108*   BMET  Recent Labs  09/17/16 1827 09/18/16 0835 09/19/16 0310  NA 138 139 138  K 4.4 4.5 4.3  CL 104  107 107  CO2 23 25 23   GLUCOSE 134* 104* 104*  BUN 16 14 14   CREATININE 1.29* 1.18 0.89  CALCIUM 9.4 8.8* 8.8*   LFT  Recent Labs  09/19/16 0310  PROT 5.5*  ALBUMIN 2.5*  AST 182*  ALT 187*  ALKPHOS 152*  BILITOT 2.7*   PT/INR  Recent Labs  09/18/16 0835  LABPROT 23.2*  INR 2.02     Studies/Results: US Abdomen Limited  Result Date: 09/17/2016 CLINICAL DATA:  Initial valuation for acute abdominal pain. EXAM: US ABDOMEN LIMITED - RIGHT UPPER QUADRANT COMPARISON:  Prior CT from 08/15/2016. FINDINGS: Gallbladder: Few echogenic stones present within the gallbladder lumen, largest of which measured 1.5 cm. Associated gallbladder sludge. Gallbladder wall measured within normal limits at 2.8 mm. No free pericholecystic fluid. No sonographic Murphy sign elicited on exam. Common bile duct: Diameter: 6.6 mm Liver: No focal lesion identified. Within normal limits in parenchymal echogenicity. Left hepatic lobe not well visualized. IMPRESSION: Cholelithiasis with gallbladder sludge. No other sonographic features to suggest acute cholecystitis. No biliary dilatation. Electronically Signed   By: Jeannine Boga M.D.   On: 09/17/2016 21:09   Dg Abd 2 Views  Result Date: 09/18/2016 CLINICAL DATA:  83 year old male with generalized abdominal pain and constipation. Personal history of gastrointestinal bleed. Initial encounter. EXAM: ABDOMEN - 2 VIEW COMPARISON:  0952 hours today and earlier. FINDINGS: Upright and supine views of the abdomen and pelvis no pneumoperitoneum. Non obstructed bowel gas pattern. Cholelithiasis again noted. Extensive pelvic surgical clips again noted. Stable visualized osseous structures. Negative visible lung bases. Iliofemoral calcified atherosclerosis. IMPRESSION: Normal bowel  gas pattern, no free air. Cholelithiasis. Electronically Signed   By: Genevie Ann M.D.   On: 09/18/2016 15:24   Dg Abd Portable 1v  Result Date: 09/18/2016 CLINICAL DATA:  83 year old male with chest and abdominal pain since yesterday. Initial encounter. EXAM: PORTABLE ABDOMEN - 1 VIEW COMPARISON:  Right upper quadrant ultrasound 09/17/2016. CT Abdomen and Pelvis 10/07/2015 FINDINGS: Portable AP supine view at 0952 hours. Non obstructed bowel gas pattern. Cholelithiasis again noted. Stable abdominal and pelvic visceral contours. Stable osseous degenerative changes. Extensive pelvic sidewall surgical clips again noted. IMPRESSION: Normal bowel gas pattern.  Cholelithiasis. Electronically Signed   By: Genevie Ann M.D.   On: 09/18/2016 10:02    Impression/Plan: 83 yo with gallstone pancreatitis and increase in LFTs yesterday likely due to passage of a gallstone. LFTs improving now and lipase has normalized. Awaiting MRCP. Doubt he has a current CBD stone and if one shows up on MRCP it is not obstructing. Needs surgical consult prior to discharge but defer to hospitalist for timing. [facility] will f/u tomorrow.    LOS: 0 days   [place] C.  09/19/2016, 11:45 AM  Pager [phone]  If no answer or after 5 PM call [phone]

## 2016-09-19 NOTE — Progress Notes (Signed)
PROGRESS NOTE    Gregory [name]  [id number] DOB: 12-25-1929 DOA: 09/17/2016 PCP: Gregory Spurling, MD    Brief Narrative:  83 y.o.malewith medical history significant for STEMI in 2017, prostate Ca, paroxysmal atrial fibrillation on apixaban, hypertension, gastric ulcers and GI bleed, CAD, CVA in 2015 (left cerebral hemisphere CVA on MRI)  Of note, previous hospitalization from 08/05/2016 --> 08/22/2016 for incarcerated hernia and underwent surgery 08/05/2016 and then developed large pericardial effusion with evidence of tamponade and he underwent pericardiocentesis 08/06/2016. Then he developed afib and was started on BB and amiodarone. Drain was finally removed 12/24 and 2 D ECHO later 12/29 showed moderate effusion without tamponade. Please note that during that hospitalization palliative care was also consulted for goals of care and was supposed to continue to follow while pt in SNF.  Pt presented to [facility] with chest and abdominal pain, initially felt as burning type of pain and it was 7-8/10 in intensity, intermittent and radiating to suprapubic area. He did not tolerate much PO intake and even sips of water made him nauseous.  He reported 2 episodes of non bloody vomiting. Pt reported pain was better after morphine.  BP on admission was 101/55, HR 91, normal temp, oxygen saturation was 98-100% on room air. Blood work was notable for Cr of 1.29 , lipase was 101, AST was 46 and repeat number 392, ALT was WNL and then 269, ALP was normal and repeat lab 163 and Bilirubin was 1.8 --> 2.2. Initial troponin was 0.05, lactic acid 2.32. FOBT was negative. He had a RUQ [place] which showed cholelithiasis and GB sludge, no cholecystitis.     Assessment & Plan:   Principal Problem:   Abdominal pain Active Problems:   Bacteremia due to Gram-negative bacteria   Hypertension   Hyperlipidemia   PAF (paroxysmal atrial fibrillation) (HCC)   Chronic diastolic congestive heart failure (HCC)   Adult  failure to thrive   Pressure injury of skin   Protein-calorie malnutrition, severe   DNR (do not resuscitate) discussion   Palliative care by specialist   Cholelithiases   Gallstone pancreatitis: Probable   Biliary colic: Probable  Abdominal pain /  Abnormal LFT's likely secondary to gallstone pancreatitis versus biliary colic - Patient had presented with abdominal pain epigastric in nature with some nausea and emesis prior to admission. Concern for possible passed gallstone. - LFT's initially WNL and then up AST 392, ALT 269, ALP 163 and Bili 2.2. Patient also noted to have a elevated lipase of 101. - Abdominal ultrasound done showed cholelithiasis with gallbladder sludge. No other sonographic features to suggest acute cholecystitis. No biliary dilatation.  - No acute findings on abd x ray - LFTs today with a AST of 182 from 392 and ALT of 187 from 269. Lipase level of 58 from 101. MRCP obtained with cholelithiasis, moderate gallbladder sludge, diffuse gallbladder wall thickening, gallbladder distention, no pericholecystic fluid. MRI findings equivocal for acute cholecystitis and correlate with clinical exam, right upper quadrant abdominal sonogram and/or hepatobiliary scintigraphy. No biliary ductal dilatation. Gregory [name] is sludge throughout the common bile duct. No biliary mass or stricture. Small to moderate pericardial effusion/thickening decreased since 08/05/2016 CT study.  - GI was consulted and currently recommending supportive care while awaiting MRCP results. --Patient will likely benefit evaluation from general surgery during this hospitalization.  - Will likely need general surgery input hopefully early this week.  Bacteremia secondary to gram-negative rods/Klebsiella oxytoca Blood cultures preliminary results positive for 2/2 gram-negative rods with BC ID  of Klebsiella oxytoca. CBC pending. Place on IV Rocephin and IV Flagyl as patient had presented with probable gallstone  pancreatitis/biliary colic. Sensitivities pending.  Mild troponin elevation - Likely demand ischemia from acute kidney injury versus his history of recent pericardiocentesis  - Trop level flat at 0.05, 0.04 and 0.05 - No chest pain at this time - The 12 lead EKG showed sinus rhythm - Obtain 2 D ECHO just to reassess that pericardial effusion seen on previous admission. He is hemodynamically stable   Acute kidney injury  - Likely prerenal - Cr normalized with IV fluids  PAF (paroxysmal atrial fibrillation) (HCC) - CHADS vasc score 3 - Last 2 D ECHO in 07/2016 showed preserved EF - Rate controlled with amiodarone and metoprolol - Continue anticoagulation with apixaban    Essential hypertension - Metoprolol held this morning secondary to hypotension. Patient placed on IV fluids.   Dyslipidemia - Continue atorvastatin   Adult failure to thrive / Protein-calorie malnutrition, severe - In the context of acute illness - Seen by nutritionist  Depression - Continue Zoloft   Pressure injury of skin, stage 2 - WOC consult appreciated   Influenza / Leukocytosis / Lactic acidosis / Sepsis  - On Tamiflu prior to admission so just resumed to complete the course - Lactic acid normalized - Blood cx not obtained for some reason on admission but pt did meet sepsis criteria with lactic acidosis, no leukocytosis, tachypnea;  -blood cultures 2/2 obtained positive for gram-negative rods with preliminary results of Klebsiella oxytoca.  - Will place empirically on IV Rocephin and IV Flagyl, pending sensitivities.  Thrombocytopenia - Likely in the setting of anticoagulation - No reports of bleed - Follow up CBC daily      DVT prophylaxis: apixaban Code Status: Full Family Communication: Updated patient. No family at bedside. Disposition Plan: Home once LFTs have trended down and resolved, sensitivities have resulted with blood cultures, 2-D echo repeated, when cleared by GI, and  when seen by general surgery for evaluation for possible cholecystectomy.   Consultants:   Gastroenterology: Dr. Michail [name] 09/19/2016  Palliative care Gregory Lessen, NP 09/19/2016  Procedures:   MRCP 09/19/2016  Abdominal ultrasound 09/17/2016  Abdominal x-ray 09/18/2016  Antimicrobials:   IV Rocephin 09/19/2016  IV Flagyl 09/19/2016   Subjective: Patient states some improvement with abdominal pain. No nausea. No emesis. No chest pain. No shortness of breath.  Objective: Vitals:   09/18/16 1600 09/18/16 1945 09/19/16 0536 09/19/16 1505  BP: 103/62 (!) 109/54 (!) 93/55 121/70  Pulse:  73 65 70  Resp:  16 16 14   Temp:  97.9 F (36.6 C) 97.9 F (36.6 C) 97.8 F (36.6 C)  TempSrc:  Oral Oral Oral  SpO2:  100% 100% 100%  Weight:        Intake/Output Summary (Last 24 hours) at 09/19/16 1733 Last data filed at 09/19/16 1519  Gross per 24 hour  Intake           995.67 ml  Output              350 ml  Net           645.67 ml   Filed Weights   09/18/16 0049  Weight: 59.2 kg (130 lb 8.2 oz)    Examination:  General exam: Appears calm and comfortable. Cachetic. Chronically ill appearing. Respiratory system: Clear to auscultation anterior lungs fields. Respiratory effort normal. Cardiovascular system: S1 & S2 heard, RRR. No JVD, murmurs, rubs, gallops or clicks. No pedal  edema. Gastrointestinal system: Abdomen is nondistended, soft and nontender. No organomegaly or masses felt. Normal bowel sounds heard. Central nervous system: Alert and oriented. No focal neurological deficits. Extremities: Symmetric 5 x 5 power. Skin: No rashes, lesions or ulcers Psychiatry: Judgement and insight appear normal. Mood & affect appropriate.     Data Reviewed: I have personally reviewed following labs and imaging studies  CBC:  Recent Labs Lab 09/17/16 1827 09/18/16 0835 09/19/16 1607  WBC 7.0 16.1* 7.6  NEUTROABS 4.7  --  5.8  HGB 14.2 12.7* 12.3*  HCT 43.1 38.4* 37.6*    MCV 84.3 84.0 84.5  PLT 202 108* 85*   Basic Metabolic Panel:  Recent Labs Lab 09/17/16 1827 09/18/16 0835 09/19/16 0310  NA 138 139 138  K 4.4 4.5 4.3  CL 104 107 107  CO2 23 25 23   GLUCOSE 134* 104* 104*  BUN 16 14 14   CREATININE 1.29* 1.18 0.89  CALCIUM 9.4 8.8* 8.8*   GFR: Estimated Creatinine Clearance: 51.7 mL/min (by C-G formula based on SCr of 0.89 mg/dL). Liver Function Tests:  Recent Labs Lab 09/17/16 1827 09/18/16 0835 09/19/16 0310  AST 46* 392* 182*  ALT 35 269* 187*  ALKPHOS 87 163* 152*  BILITOT 1.8* 2.2* 2.7*  PROT 6.9 5.6* 5.5*  ALBUMIN 3.5 2.6* 2.5*    Recent Labs Lab 09/17/16 1827 09/19/16 0310  LIPASE 101* 58*   No results for input(s): AMMONIA in the last 168 hours. Coagulation Profile:  Recent Labs Lab 09/18/16 0835  INR 2.02   Cardiac Enzymes:  Recent Labs Lab 09/17/16 1827 09/18/16 0114 09/18/16 0835  TROPONINI 0.05* 0.04* 0.05*   BNP (last 3 results) No results for input(s): PROBNP in the last 8760 hours. HbA1C: No results for input(s): HGBA1C in the last 72 hours. CBG: No results for input(s): GLUCAP in the last 168 hours. Lipid Profile: No results for input(s): CHOL, HDL, LDLCALC, TRIG, CHOLHDL, LDLDIRECT in the last 72 hours. Thyroid Function Tests: No results for input(s): TSH, T4TOTAL, FREET4, T3FREE, THYROIDAB in the last 72 hours. Anemia Panel: No results for input(s): VITAMINB12, FOLATE, FERRITIN, TIBC, IRON, RETICCTPCT in the last 72 hours. Sepsis Labs:  Recent Labs Lab 09/17/16 1842 09/18/16 0114 09/18/16 0405  LATICACIDVEN 2.32* 2.1* 1.6    Recent Results (from the past 240 hour(s))  Surgical pcr screen     Status: Abnormal   Collection Time: 09/18/16  1:02 AM  Result Value Ref Range Status   MRSA, PCR NEGATIVE NEGATIVE Final   Staphylococcus aureus POSITIVE (A) NEGATIVE Final    Comment:        The Xpert SA Assay (FDA approved for NASAL specimens in patients over 77 years of age), is one  component of a comprehensive surveillance program.  Test performance has been validated by [facility] for patients greater than or equal to 94 year old. It is not intended to diagnose infection nor to guide or monitor treatment.   Culture, blood (routine x 2)     Status: None (Preliminary result)   Collection Time: 09/18/16  8:25 PM  Result Value Ref Range Status   Specimen Description BLOOD LEFT ARM  Final   Special Requests BOTTLES DRAWN AEROBIC ONLY  8CC  Final   Culture  Setup Time   Final    GRAM NEGATIVE RODS AEROBIC BOTTLE ONLY Organism ID to follow CRITICAL RESULT CALLED TO, READ BACK BY AND VERIFIED WITH: E. Hassell Done, PHARM, 09/19/16 AT 1428 BY J FUDESCO    Culture  NO GROWTH < 24 HOURS  Final   Report Status PENDING  Incomplete  Blood Culture ID Panel (Reflexed)     Status: Abnormal   Collection Time: 09/18/16  8:25 PM  Result Value Ref Range Status   Enterococcus species NOT DETECTED NOT DETECTED Final   Listeria monocytogenes NOT DETECTED NOT DETECTED Final   Staphylococcus species NOT DETECTED NOT DETECTED Final   Staphylococcus aureus NOT DETECTED NOT DETECTED Final   Streptococcus species NOT DETECTED NOT DETECTED Final   Streptococcus agalactiae NOT DETECTED NOT DETECTED Final   Streptococcus pneumoniae NOT DETECTED NOT DETECTED Final   Streptococcus pyogenes NOT DETECTED NOT DETECTED Final   Acinetobacter baumannii NOT DETECTED NOT DETECTED Final   Enterobacteriaceae species DETECTED (A) NOT DETECTED Final    Comment: Enterobacteriaceae represent a large family of gram-negative bacteria, not a single organism. CRITICAL RESULT CALLED TO, READ BACK BY AND VERIFIED WITH: E. MARTIN, 09/19/16 AT 110 BY J FUDESCO    Enterobacter cloacae complex NOT DETECTED NOT DETECTED Final   Escherichia coli NOT DETECTED NOT DETECTED Final   Klebsiella oxytoca DETECTED (A) NOT DETECTED Final    Comment: CRITICAL RESULT CALLED TO, READ BACK BY AND VERIFIED WITH: E. MARTIN, 09/19/16  AT 1428 BY J FUDESCO    Klebsiella pneumoniae NOT DETECTED NOT DETECTED Final   Proteus species NOT DETECTED NOT DETECTED Final   Serratia marcescens NOT DETECTED NOT DETECTED Final   Carbapenem resistance NOT DETECTED NOT DETECTED Final   Haemophilus influenzae NOT DETECTED NOT DETECTED Final   Neisseria meningitidis NOT DETECTED NOT DETECTED Final   Pseudomonas aeruginosa NOT DETECTED NOT DETECTED Final   Candida albicans NOT DETECTED NOT DETECTED Final   Candida glabrata NOT DETECTED NOT DETECTED Final   Candida krusei NOT DETECTED NOT DETECTED Final   Candida parapsilosis NOT DETECTED NOT DETECTED Final   Candida tropicalis NOT DETECTED NOT DETECTED Final  Culture, blood (routine x 2)     Status: None (Preliminary result)   Collection Time: 09/18/16  8:34 PM  Result Value Ref Range Status   Specimen Description BLOOD RIGHT HAND  Final   Special Requests IN PEDIATRIC BOTTLE  3CC  Final   Culture  Setup Time   Final    GRAM NEGATIVE RODS IN PEDIATRIC BOTTLE Organism ID to follow CRITICAL RESULT CALLED TO, READ BACK BY AND VERIFIED WITH: E. MARTIN, PHARM, 09/19/16 AT 22 BY J FUDESCO    Culture GRAM NEGATIVE RODS  Final   Report Status PENDING  Incomplete  Blood Culture ID Panel (Reflexed)     Status: Abnormal   Collection Time: 09/18/16  8:34 PM  Result Value Ref Range Status   Enterococcus species NOT DETECTED NOT DETECTED Final   Listeria monocytogenes NOT DETECTED NOT DETECTED Final   Staphylococcus species NOT DETECTED NOT DETECTED Final   Staphylococcus aureus NOT DETECTED NOT DETECTED Final   Streptococcus species NOT DETECTED NOT DETECTED Final   Streptococcus agalactiae NOT DETECTED NOT DETECTED Final   Streptococcus pneumoniae NOT DETECTED NOT DETECTED Final   Streptococcus pyogenes NOT DETECTED NOT DETECTED Final   Acinetobacter baumannii NOT DETECTED NOT DETECTED Final   Enterobacteriaceae species DETECTED (A) NOT DETECTED Final    Comment: Enterobacteriaceae  represent a large family of gram-negative bacteria, not a single organism. CRITICAL RESULT CALLED TO, READ BACK BY AND VERIFIED WITH: E. MARTIN, PHARM, 09/19/16 AT 1428 BY J FUDESCO    Enterobacter cloacae complex NOT DETECTED NOT DETECTED Final   Escherichia coli NOT DETECTED NOT  DETECTED Final   Klebsiella oxytoca DETECTED (A) NOT DETECTED Final    Comment: CRITICAL RESULT CALLED TO, READ BACK BY AND VERIFIED WITH: E. MARTIN , PHARM, 09/19/16 AT 1428 BY J FUDESCO    Klebsiella pneumoniae NOT DETECTED NOT DETECTED Final   Proteus species NOT DETECTED NOT DETECTED Final   Serratia marcescens NOT DETECTED NOT DETECTED Final   Carbapenem resistance NOT DETECTED NOT DETECTED Final   Haemophilus influenzae NOT DETECTED NOT DETECTED Final   Neisseria meningitidis NOT DETECTED NOT DETECTED Final   Pseudomonas aeruginosa NOT DETECTED NOT DETECTED Final   Candida albicans NOT DETECTED NOT DETECTED Final   Candida glabrata NOT DETECTED NOT DETECTED Final   Candida krusei NOT DETECTED NOT DETECTED Final   Candida parapsilosis NOT DETECTED NOT DETECTED Final   Candida tropicalis NOT DETECTED NOT DETECTED Final         Radiology Studies: Mr 3d Recon At Scanner  Result Date: 09/19/2016 CLINICAL DATA:  83 year old male inpatient admitted with upper abdominal pain with elevated liver function tests and diagnosed with acute pancreatitis. Leukocytosis. Cholelithiasis on abdominal sonogram. Status post incarcerated left inguinal hernia repair in December 2017. EXAM: MRI ABDOMEN WITHOUT AND WITH CONTRAST (INCLUDING MRCP) TECHNIQUE: Multiplanar multisequence MR imaging of the abdomen was performed both before and after the administration of intravenous contrast. Heavily T2-weighted images of the biliary and pancreatic ducts were obtained, and three-dimensional MRCP images were rendered by post processing. CONTRAST:  65mL MULTIHANCE GADOBENATE DIMEGLUMINE 529 MG/ML IV SOLN COMPARISON:  09/17/2016 right upper  quadrant abdominal sonogram. 08/05/2016 CT abdomen/pelvis. FINDINGS: Significantly motion degraded scan. Lower chest: Cardiomegaly. Small to moderate pericardial effusion/thickening, decreased since 08/05/2016 CT study. Mild compressive atelectasis at the left lung base. Hepatobiliary: Normal liver size and configuration. No hepatic steatosis. There are several scattered tiny subcentimeter simple liver cysts throughout the liver. No suspicious liver masses. Several irregular foci of arterial phase hyperenhancement throughout the liver are occult on all other sequences and are favored to represent benign transient perfusional phenomena. There is gallbladder distention. There are a few layering gallstones measuring up to 10 mm in size. There is moderate sludge in the gallbladder. There is mild diffuse gallbladder wall thickening without pericholecystic fluid. No intrahepatic biliary ductal dilatation. Common bile duct diameter 6 mm, within normal limits for age. There is ill-defined intermediate signal intensity material throughout the common bile duct compatible with sludge. No discrete stones in the bile ducts. No biliary or ampullary mass. Pancreas: Normal size pancreas. Normal pancreatic parenchymal signal intensity. No peripancreatic fluid collections. No pancreatic mass or duct dilation. No no evidence of pancreas divisum. Spleen: Normal size. No mass. Adrenals/Urinary Tract: No discrete adrenal nodules. No hydronephrosis. Normal kidneys with no renal mass. Stomach/Bowel: Grossly normal stomach. Visualized small and large bowel is normal caliber, with no bowel wall thickening. Vascular/Lymphatic: Atherosclerotic nonaneurysmal abdominal aorta . Patent portal, splenic, hepatic and renal veins. No pathologically enlarged lymph nodes in the abdomen. Other: No abdominal ascites or focal fluid collection. Musculoskeletal: No aggressive appearing focal osseous lesions. IMPRESSION: 1. Cholelithiasis. Moderate  gallbladder sludge. Diffuse gallbladder wall thickening. Gallbladder distention. No pericholecystic fluid. MRI findings are equivocal for acute cholecystitis and correlation with clinical exam, right upper quadrant abdominal sonogram and/ or hepatobiliary scintigraphy can be performed as clinically warranted. 2. No biliary ductal dilatation. Scattered sludge throughout the common bile duct without appreciable choledocholithiasis. No biliary mass or stricture. 3. No MRI findings of acute pancreatitis. No peripancreatic fluid collections. No pancreatic duct dilation. 4. Cardiomegaly. Small  to moderate pericardial effusion/thickening, decreased since 08/05/2016 CT study. 5. Aortic atherosclerosis. Electronically Signed   By: Ilona Sorrel M.D.   On: 09/19/2016 15:07   US Abdomen Limited  Result Date: 09/17/2016 CLINICAL DATA:  Initial valuation for acute abdominal pain. EXAM: US ABDOMEN LIMITED - RIGHT UPPER QUADRANT COMPARISON:  Prior CT from 08/15/2016. FINDINGS: Gallbladder: Few echogenic stones present within the gallbladder lumen, largest of which measured 1.5 cm. Associated gallbladder sludge. Gallbladder wall measured within normal limits at 2.8 mm. No free pericholecystic fluid. No sonographic Murphy sign elicited on exam. Common bile duct: Diameter: 6.6 mm Liver: No focal lesion identified. Within normal limits in parenchymal echogenicity. Left hepatic lobe not well visualized. IMPRESSION: Cholelithiasis with gallbladder sludge. No other sonographic features to suggest acute cholecystitis. No biliary dilatation. Electronically Signed   By: Jeannine Boga M.D.   On: 09/17/2016 21:09   Dg Abd 2 Views  Result Date: 09/18/2016 CLINICAL DATA:  83 year old male with generalized abdominal pain and constipation. Personal history of gastrointestinal bleed. Initial encounter. EXAM: ABDOMEN - 2 VIEW COMPARISON:  0952 hours today and earlier. FINDINGS: Upright and supine views of the abdomen and pelvis no  pneumoperitoneum. Non obstructed bowel gas pattern. Cholelithiasis again noted. Extensive pelvic surgical clips again noted. Stable visualized osseous structures. Negative visible lung bases. Iliofemoral calcified atherosclerosis. IMPRESSION: Normal bowel gas pattern, no free air. Cholelithiasis. Electronically Signed   By: Genevie Ann M.D.   On: 09/18/2016 15:24   Dg Abd Portable 1v  Result Date: 09/18/2016 CLINICAL DATA:  83 year old male with chest and abdominal pain since yesterday. Initial encounter. EXAM: PORTABLE ABDOMEN - 1 VIEW COMPARISON:  Right upper quadrant ultrasound 09/17/2016. CT Abdomen and Pelvis 10/07/2015 FINDINGS: Portable AP supine view at 0952 hours. Non obstructed bowel gas pattern. Cholelithiasis again noted. Stable abdominal and pelvic visceral contours. Stable osseous degenerative changes. Extensive pelvic sidewall surgical clips again noted. IMPRESSION: Normal bowel gas pattern.  Cholelithiasis. Electronically Signed   By: Genevie Ann M.D.   On: 09/18/2016 10:02   Mr Abdomen Mrcp Moise Boring Contast  Result Date: 09/19/2016 CLINICAL DATA:  83 year old male inpatient admitted with upper abdominal pain with elevated liver function tests and diagnosed with acute pancreatitis. Leukocytosis. Cholelithiasis on abdominal sonogram. Status post incarcerated left inguinal hernia repair in December 2017. EXAM: MRI ABDOMEN WITHOUT AND WITH CONTRAST (INCLUDING MRCP) TECHNIQUE: Multiplanar multisequence MR imaging of the abdomen was performed both before and after the administration of intravenous contrast. Heavily T2-weighted images of the biliary and pancreatic ducts were obtained, and three-dimensional MRCP images were rendered by post processing. CONTRAST:  61mL MULTIHANCE GADOBENATE DIMEGLUMINE 529 MG/ML IV SOLN COMPARISON:  09/17/2016 right upper quadrant abdominal sonogram. 08/05/2016 CT abdomen/pelvis. FINDINGS: Significantly motion degraded scan. Lower chest: Cardiomegaly. Small to moderate pericardial  effusion/thickening, decreased since 08/05/2016 CT study. Mild compressive atelectasis at the left lung base. Hepatobiliary: Normal liver size and configuration. No hepatic steatosis. There are several scattered tiny subcentimeter simple liver cysts throughout the liver. No suspicious liver masses. Several irregular foci of arterial phase hyperenhancement throughout the liver are occult on all other sequences and are favored to represent benign transient perfusional phenomena. There is gallbladder distention. There are a few layering gallstones measuring up to 10 mm in size. There is moderate sludge in the gallbladder. There is mild diffuse gallbladder wall thickening without pericholecystic fluid. No intrahepatic biliary ductal dilatation. Common bile duct diameter 6 mm, within normal limits for age. There is ill-defined intermediate signal intensity material throughout the  common bile duct compatible with sludge. No discrete stones in the bile ducts. No biliary or ampullary mass. Pancreas: Normal size pancreas. Normal pancreatic parenchymal signal intensity. No peripancreatic fluid collections. No pancreatic mass or duct dilation. No no evidence of pancreas divisum. Spleen: Normal size. No mass. Adrenals/Urinary Tract: No discrete adrenal nodules. No hydronephrosis. Normal kidneys with no renal mass. Stomach/Bowel: Grossly normal stomach. Visualized small and large bowel is normal caliber, with no bowel wall thickening. Vascular/Lymphatic: Atherosclerotic nonaneurysmal abdominal aorta . Patent portal, splenic, hepatic and renal veins. No pathologically enlarged lymph nodes in the abdomen. Other: No abdominal ascites or focal fluid collection. Musculoskeletal: No aggressive appearing focal osseous lesions. IMPRESSION: 1. Cholelithiasis. Moderate gallbladder sludge. Diffuse gallbladder wall thickening. Gallbladder distention. No pericholecystic fluid. MRI findings are equivocal for acute cholecystitis and  correlation with clinical exam, right upper quadrant abdominal sonogram and/ or hepatobiliary scintigraphy can be performed as clinically warranted. 2. No biliary ductal dilatation. Scattered sludge throughout the common bile duct without appreciable choledocholithiasis. No biliary mass or stricture. 3. No MRI findings of acute pancreatitis. No peripancreatic fluid collections. No pancreatic duct dilation. 4. Cardiomegaly. Small to moderate pericardial effusion/thickening, decreased since 08/05/2016 CT study. 5. Aortic atherosclerosis. Electronically Signed   By: Ilona Sorrel M.D.   On: 09/19/2016 15:07        Scheduled Meds: . amiodarone  400 mg Oral Daily  . apixaban  2.5 mg Oral BID  . atorvastatin  40 mg Oral Daily  . brimonidine  1 drop Both Eyes QHS  . cefTRIAXone (ROCEPHIN)  IV  2 g Intravenous Q24H  . Chlorhexidine Gluconate Cloth  6 each Topical Daily  . feeding supplement (ENSURE ENLIVE)  237 mL Oral TID BM  . metoprolol  100 mg Oral BID  . metronidazole  500 mg Intravenous Q8H  . multivitamin with minerals  1 tablet Oral Daily  . mupirocin ointment  1 application Nasal BID  . oseltamivir  30 mg Oral BID  . pantoprazole  40 mg Oral BID  . senna  1 tablet Oral Daily  . sertraline  50 mg Oral Daily   Continuous Infusions: . sodium chloride 0.9 % 1,000 mL infusion 125 mL/hr at 09/19/16 1452     LOS: 0 days    Time spent: 2 mins    Indiah Heyden, MD [facility] Pager [phone]  If 7PM-7AM, please contact night-coverage [email or web address] Password [facility] 09/19/2016, 5:33 PM

## 2016-09-20 ENCOUNTER — Inpatient Hospital Stay (HOSPITAL_COMMUNITY): Payer: Medicare Other

## 2016-09-20 DIAGNOSIS — R7989 Other specified abnormal findings of blood chemistry: Secondary | ICD-10-CM

## 2016-09-20 DIAGNOSIS — R109 Unspecified abdominal pain: Secondary | ICD-10-CM

## 2016-09-20 DIAGNOSIS — I313 Pericardial effusion (noninflammatory): Secondary | ICD-10-CM

## 2016-09-20 LAB — BASIC METABOLIC PANEL
ANION GAP: 8 (ref 5–15)
BUN: 10 mg/dL (ref 6–20)
CALCIUM: 8.4 mg/dL — AB (ref 8.9–10.3)
CO2: 25 mmol/L (ref 22–32)
Chloride: 107 mmol/L (ref 101–111)
Creatinine, Ser: 0.88 mg/dL (ref 0.61–1.24)
GFR calc Af Amer: >60 mL/min (ref >60)
GLUCOSE: 92 mg/dL (ref 65–99)
POTASSIUM: 4.2 mmol/L (ref 3.5–5.1)
Sodium: 140 mmol/L (ref 135–145)

## 2016-09-20 LAB — HEPATIC FUNCTION PANEL
ALT: 101 U/L — AB (ref 17–63)
AST: 62 U/L — AB (ref 15–41)
Albumin: 2.2 g/dL — ABNORMAL LOW (ref 3.5–5.0)
Alkaline Phosphatase: 126 U/L (ref 38–126)
BILIRUBIN DIRECT: 0.7 mg/dL — AB (ref 0.1–0.5)
BILIRUBIN INDIRECT: 0.9 mg/dL (ref 0.3–0.9)
TOTAL PROTEIN: 5.1 g/dL — AB (ref 6.5–8.1)
Total Bilirubin: 1.6 mg/dL — ABNORMAL HIGH (ref 0.3–1.2)

## 2016-09-20 LAB — CBC WITH DIFFERENTIAL/PLATELET
Basophils Absolute: 0 10*3/uL (ref 0.0–0.1)
Basophils Relative: 0 %
Eosinophils Absolute: 0 10*3/uL (ref 0.0–0.7)
Eosinophils Relative: 0 %
HCT: 33.2 % — ABNORMAL LOW (ref 39.0–52.0)
Hemoglobin: 10.7 g/dL — ABNORMAL LOW (ref 13.0–17.0)
Lymphocytes Relative: 16 %
Lymphs Abs: 1.2 10*3/uL (ref 0.7–4.0)
MCH: 27.2 pg (ref 26.0–34.0)
MCHC: 32.2 g/dL (ref 30.0–36.0)
MCV: 84.5 fL (ref 78.0–100.0)
Monocytes Absolute: 0.9 10*3/uL (ref 0.1–1.0)
Monocytes Relative: 13 %
Neutro Abs: 5.2 10*3/uL (ref 1.7–7.7)
Neutrophils Relative %: 71 %
Platelets: 99 10*3/uL — ABNORMAL LOW (ref 150–400)
RBC: 3.93 MIL/uL — ABNORMAL LOW (ref 4.22–5.81)
RDW: 16.4 % — ABNORMAL HIGH (ref 11.5–15.5)
WBC: 7.3 10*3/uL (ref 4.0–10.5)

## 2016-09-20 LAB — ECHOCARDIOGRAM LIMITED: WEIGHTICAEL: 2088.2 [oz_av]

## 2016-09-20 LAB — LIPASE, BLOOD: Lipase: 58 U/L — ABNORMAL HIGH (ref 11–51)

## 2016-09-20 NOTE — Consult Note (Addendum)
[facility] Nurse wound consult note Reason for Consult: Consult requested for buttocks and sacrum. Wound type: Deep tissue injury to sacrum; 3X3cm area which is darker-colored than surrounding skin with slight reddish tint. Measurement: Right inner buttock with patchy area of full thickness skin loss; .2X.2X.1cm and .2X.2X.1cm; both are pale white in color without odor or drainage.  There is pink dry scar tissue to left buttocks where other previous wounds have healed.  Location and apperance is NOT consistent with pressure injuries to inner gluteal fold; wounds were present on admission and are probably related to moisture associated skin damage. Dressing procedure/placement/frequency: Foam dressing to protect and promote healing. Discussed plan of care with patient and he verbalized understanding. Please re-consult if further assistance is needed.  Thank-you,  Julien Girt MSN, [place], [place], [place], [place]

## 2016-09-20 NOTE — Progress Notes (Signed)
Responded to consult, visited w/ pt newly on droplet precautions. Had just heard in progression that he was -- & that he'd not been in that status till at least Sat (after coming in last Fri for abdominal pain). Now, however, pt reportedly has flu, meningitis, and other med problems. Pt is soft-spoken. It was hard to hear him at times, esp. 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 He would like to go home. Provided spiritual/emotional support and prayer -- which pt appreciated. Reviewed notes, & remain available to participate w/ famly in any mtgs on goals of care, as per palliative note, and/or, if pt wishes, to execute advanced directive.   09/20/16 1000  Clinical Encounter Type  Visited With Patient  Visit Type Initial;Psychological support;Spiritual support;Social support  Referral From Nurse  Spiritual Encounters  Spiritual Needs Prayer;Emotional  Stress Factors  Patient Stress Factors Health changes;Loss of control   Gerrit Heck, Chaplain

## 2016-09-20 NOTE — Evaluation (Signed)
Physical Therapy Evaluation Patient Details Name: Gregory [name] MRN: [id number] DOB: 06-04-1930 Today's Date: 09/20/2016   History of Present Illness  83 y.o.malewith chest and abdominal pain likely secondary to gallstone pancreatitis versus biliary colic, influenza, and bacteremia. PMH: STEMI in 2017, prostate Ca, paroxysmal atrial fibrillation, hypertension, gastric ulcers and GI bleed, CAD, CVA in 2015.  Clinical Impression  Pt with mild instability with mobility during PT session, requiring mod assist with bed mobility as well. Pt states that he does live alone but can have his friend's son stay with him at D/C. Pt will need to improve his mobility to safely return home at D/C. PT to continue to follow to progress mobility and safety. D/C recommendations may need to be modified if pt unable to make anticipated progress.     Follow Up Recommendations Home health PT;Supervision for mobility/OOB    Equipment Recommendations  Rolling walker with 5" wheels    Recommendations for Other Services       Precautions / Restrictions Precautions Precautions: Fall Restrictions Weight Bearing Restrictions: No      Mobility  Bed Mobility Overal bed mobility: Needs Assistance Bed Mobility: Supine to Sit     Supine to sit: Mod assist     General bed mobility comments: assist needed at trunk to come to sitting, using rail and HOB elevated  Transfers Overall transfer level: Needs assistance Equipment used: Rolling walker (2 wheeled) Transfers: Sit to/from Stand Sit to Stand: Min assist         General transfer comment: mild instability with standing initially, good hand placement.   Ambulation/Gait Ambulation/Gait assistance: Min guard Ambulation Distance (Feet): 45 Feet Assistive device: Rolling walker (2 wheeled) Gait Pattern/deviations: Step-through pattern;Decreased step length - right;Decreased step length - left Gait velocity: decreased   General Gait Details:  improving stability with ambulation but continued min guard for safety.   Stairs            Wheelchair Mobility    Modified Rankin (Stroke Patients Only)       Balance Overall balance assessment: Needs assistance Sitting-balance support: No upper extremity supported Sitting balance-Leahy Scale: Fair     Standing balance support: Bilateral upper extremity supported Standing balance-Leahy Scale: Poor Standing balance comment: using rw for support                             Pertinent Vitals/Pain Pain Assessment: 0-10 Pain Score: 8  Pain Location: abdomen Pain Descriptors / Indicators:  (hurts) Pain Intervention(s): Limited activity within patient's tolerance;Monitored during session    Home Living Family/patient expects to be discharged to:: Private residence Living Arrangements: Alone Available Help at Discharge: Friend(s);Available PRN/intermittently Type of Home: House Home Access: Stairs to enter Entrance Stairs-Rails: None Entrance Stairs-Number of Steps: 2  Home Layout: One level Home Equipment: Cane - single point Additional Comments: reports that friend's son can stay with him intermittently    Prior Function Level of Independence: Independent         Comments: reports not using any assistive device but has canes available. Still driving.      Hand Dominance        Extremity/Trunk Assessment   Upper Extremity Assessment Upper Extremity Assessment: Generalized weakness    Lower Extremity Assessment Lower Extremity Assessment: Generalized weakness       Communication   Communication: No difficulties  Cognition Arousal/Alertness: Awake/alert Behavior During Therapy: WFL for tasks assessed/performed Overall Cognitive Status: Within Functional  Limits for tasks assessed                      General Comments      Exercises     Assessment/Plan    PT Assessment Patient needs continued PT services  PT Problem List  Decreased strength;Decreased activity tolerance;Decreased balance;Decreased mobility;Cardiopulmonary status limiting activity          PT Treatment Interventions DME instruction;Gait training;Stair training;Functional mobility training;Therapeutic activities;Therapeutic exercise;Patient/family education    PT Goals (Current goals can be found in the Care Plan section)  Acute Rehab PT Goals Patient Stated Goal: get back home, stop hurting PT Goal Formulation: With patient Time For Goal Achievement: 10/04/16 Potential to Achieve Goals: Good    Frequency Min 3X/week   Barriers to discharge        Co-evaluation               End of Session Equipment Utilized During Treatment: Gait belt Activity Tolerance: Patient tolerated treatment well Patient left: in chair;with call bell/phone within reach;with nursing/sitter in room Nurse Communication: Mobility status         Time: 1138-1209 PT Time Calculation (min) (ACUTE ONLY): 31 min   Charges:   PT Evaluation $PT Eval Moderate Complexity: 1 Procedure PT Treatments $Gait Training: 8-22 mins   PT G Codes:        Cassell Clement, PT, CSCS Pager [phone] Office [phone]  09/20/2016, 12:40 PM

## 2016-09-20 NOTE — Progress Notes (Signed)
[facility] Progress Note  Subjective: Denies abdominal pain, tolerating solid diet  Objective: Vital signs in last 24 hours: Temp:  [97.8 F (36.6 C)-98.8 F (37.1 C)] 98.8 F (37.1 C) (02/04 2018) Pulse Rate:  [70-81] 81 (02/04 2018) Resp:  [14-16] 16 (02/04 2018) BP: (121-127)/(62-70) 127/62 (02/04 2018) SpO2:  [100 %] 100 % (02/04 2018) Weight change:    PE: Abdomen nontender  Lab Results: Results for orders placed or performed during the hospital encounter of 09/17/16 (from the past 24 hour(s))  CBC with Differential/Platelet     Status: Abnormal   Collection Time: 09/19/16  4:07 PM  Result Value Ref Range   WBC 7.6 4.0 - 10.5 K/uL   RBC 4.45 4.22 - 5.81 MIL/uL   Hemoglobin 12.3 (L) 13.0 - 17.0 g/dL   HCT 37.6 (L) 39.0 - 52.0 %   MCV 84.5 78.0 - 100.0 fL   MCH 27.6 26.0 - 34.0 pg   MCHC 32.7 30.0 - 36.0 g/dL   RDW 16.8 (H) 11.5 - 15.5 %   Platelets 85 (L) 150 - 400 K/uL   Neutrophils Relative % 77 %   Lymphocytes Relative 14 %   Monocytes Relative 9 %   Eosinophils Relative 0 %   Basophils Relative 0 %   Neutro Abs 5.8 1.7 - 7.7 K/uL   Lymphs Abs 1.1 0.7 - 4.0 K/uL   Monocytes Absolute 0.7 0.1 - 1.0 K/uL   Eosinophils Absolute 0.0 0.0 - 0.7 K/uL   Basophils Absolute 0.0 0.0 - 0.1 K/uL   RBC Morphology MIXED RBC POPULATION   Basic metabolic panel     Status: Abnormal   Collection Time: 09/20/16  8:11 AM  Result Value Ref Range   Sodium 140 135 - 145 mmol/L   Potassium 4.2 3.5 - 5.1 mmol/L   Chloride 107 101 - 111 mmol/L   CO2 25 22 - 32 mmol/L   Glucose, Bld 92 65 - 99 mg/dL   BUN 10 6 - 20 mg/dL   Creatinine, Ser 0.88 0.61 - 1.24 mg/dL   Calcium 8.4 (L) 8.9 - 10.3 mg/dL   GFR calc non Af Amer >60 >60 mL/min   GFR calc Af Amer >60 >60 mL/min   Anion gap 8 5 - 15  Hepatic function panel     Status: Abnormal   Collection Time: 09/20/16  8:11 AM  Result Value Ref Range   Total Protein 5.1 (L) 6.5 - 8.1 g/dL   Albumin 2.2 (L) 3.5 - 5.0 g/dL    AST 62 (H) 15 - 41 U/L   ALT 101 (H) 17 - 63 U/L   Alkaline Phosphatase 126 38 - 126 U/L   Total Bilirubin 1.6 (H) 0.3 - 1.2 mg/dL   Bilirubin, Direct 0.7 (H) 0.1 - 0.5 mg/dL   Indirect Bilirubin 0.9 0.3 - 0.9 mg/dL  Lipase, blood     Status: Abnormal   Collection Time: 09/20/16  8:11 AM  Result Value Ref Range   Lipase 58 (H) 11 - 51 U/L  CBC with Differential/Platelet     Status: Abnormal   Collection Time: 09/20/16  8:11 AM  Result Value Ref Range   WBC 7.3 4.0 - 10.5 K/uL   RBC 3.93 (L) 4.22 - 5.81 MIL/uL   Hemoglobin 10.7 (L) 13.0 - 17.0 g/dL   HCT 33.2 (L) 39.0 - 52.0 %   MCV 84.5 78.0 - 100.0 fL   MCH 27.2 26.0 - 34.0 pg   MCHC 32.2 30.0 - 36.0  g/dL   RDW 16.4 (H) 11.5 - 15.5 %   Platelets 99 (L) 150 - 400 K/uL   Neutrophils Relative % 71 %   Neutro Abs 5.2 1.7 - 7.7 K/uL   Lymphocytes Relative 16 %   Lymphs Abs 1.2 0.7 - 4.0 K/uL   Monocytes Relative 13 %   Monocytes Absolute 0.9 0.1 - 1.0 K/uL   Eosinophils Relative 0 %   Eosinophils Absolute 0.0 0.0 - 0.7 K/uL   Basophils Relative 0 %   Basophils Absolute 0.0 0.0 - 0.1 K/uL    Studies/Results: Mr 3d Recon At Scanner  Result Date: 09/19/2016 CLINICAL DATA:  83 year old male inpatient admitted with upper abdominal pain with elevated liver function tests and diagnosed with acute pancreatitis. Leukocytosis. Cholelithiasis on abdominal sonogram. Status post incarcerated left inguinal hernia repair in December 2017. EXAM: MRI ABDOMEN WITHOUT AND WITH CONTRAST (INCLUDING MRCP) TECHNIQUE: Multiplanar multisequence MR imaging of the abdomen was performed both before and after the administration of intravenous contrast. Heavily T2-weighted images of the biliary and pancreatic ducts were obtained, and three-dimensional MRCP images were rendered by post processing. CONTRAST:  9mL MULTIHANCE GADOBENATE DIMEGLUMINE 529 MG/ML IV SOLN COMPARISON:  09/17/2016 right upper quadrant abdominal sonogram. 08/05/2016 CT abdomen/pelvis.  FINDINGS: Significantly motion degraded scan. Lower chest: Cardiomegaly. Small to moderate pericardial effusion/thickening, decreased since 08/05/2016 CT study. Mild compressive atelectasis at the left lung base. Hepatobiliary: Normal liver size and configuration. No hepatic steatosis. There are several scattered tiny subcentimeter simple liver cysts throughout the liver. No suspicious liver masses. Several irregular foci of arterial phase hyperenhancement throughout the liver are occult on all other sequences and are favored to represent benign transient perfusional phenomena. There is gallbladder distention. There are a few layering gallstones measuring up to 10 mm in size. There is moderate sludge in the gallbladder. There is mild diffuse gallbladder wall thickening without pericholecystic fluid. No intrahepatic biliary ductal dilatation. Common bile duct diameter 6 mm, within normal limits for age. There is ill-defined intermediate signal intensity material throughout the common bile duct compatible with sludge. No discrete stones in the bile ducts. No biliary or ampullary mass. Pancreas: Normal size pancreas. Normal pancreatic parenchymal signal intensity. No peripancreatic fluid collections. No pancreatic mass or duct dilation. No no evidence of pancreas divisum. Spleen: Normal size. No mass. Adrenals/Urinary Tract: No discrete adrenal nodules. No hydronephrosis. Normal kidneys with no renal mass. Stomach/Bowel: Grossly normal stomach. Visualized small and large bowel is normal caliber, with no bowel wall thickening. Vascular/Lymphatic: Atherosclerotic nonaneurysmal abdominal aorta . Patent portal, splenic, hepatic and renal veins. No pathologically enlarged lymph nodes in the abdomen. Other: No abdominal ascites or focal fluid collection. Musculoskeletal: No aggressive appearing focal osseous lesions. IMPRESSION: 1. Cholelithiasis. Moderate gallbladder sludge. Diffuse gallbladder wall thickening. Gallbladder  distention. No pericholecystic fluid. MRI findings are equivocal for acute cholecystitis and correlation with clinical exam, right upper quadrant abdominal sonogram and/ or hepatobiliary scintigraphy can be performed as clinically warranted. 2. No biliary ductal dilatation. Scattered sludge throughout the common bile duct without appreciable choledocholithiasis. No biliary mass or stricture. 3. No MRI findings of acute pancreatitis. No peripancreatic fluid collections. No pancreatic duct dilation. 4. Cardiomegaly. Small to moderate pericardial effusion/thickening, decreased since 08/05/2016 CT study. 5. Aortic atherosclerosis. Electronically Signed   By: Ilona Sorrel M.D.   On: 09/19/2016 15:07   Dg Abd 2 Views  Result Date: 09/18/2016 CLINICAL DATA:  83 year old male with generalized abdominal pain and constipation. Personal history of gastrointestinal bleed. Initial encounter. EXAM:  ABDOMEN - 2 VIEW COMPARISON:  0952 hours today and earlier. FINDINGS: Upright and supine views of the abdomen and pelvis no pneumoperitoneum. Non obstructed bowel gas pattern. Cholelithiasis again noted. Extensive pelvic surgical clips again noted. Stable visualized osseous structures. Negative visible lung bases. Iliofemoral calcified atherosclerosis. IMPRESSION: Normal bowel gas pattern, no free air. Cholelithiasis. Electronically Signed   By: Genevie Ann M.D.   On: 09/18/2016 15:24   Mr Abdomen Mrcp Moise Boring Contast  Result Date: 09/19/2016 CLINICAL DATA:  83 year old male inpatient admitted with upper abdominal pain with elevated liver function tests and diagnosed with acute pancreatitis. Leukocytosis. Cholelithiasis on abdominal sonogram. Status post incarcerated left inguinal hernia repair in December 2017. EXAM: MRI ABDOMEN WITHOUT AND WITH CONTRAST (INCLUDING MRCP) TECHNIQUE: Multiplanar multisequence MR imaging of the abdomen was performed both before and after the administration of intravenous contrast. Heavily T2-weighted  images of the biliary and pancreatic ducts were obtained, and three-dimensional MRCP images were rendered by post processing. CONTRAST:  65mL MULTIHANCE GADOBENATE DIMEGLUMINE 529 MG/ML IV SOLN COMPARISON:  09/17/2016 right upper quadrant abdominal sonogram. 08/05/2016 CT abdomen/pelvis. FINDINGS: Significantly motion degraded scan. Lower chest: Cardiomegaly. Small to moderate pericardial effusion/thickening, decreased since 08/05/2016 CT study. Mild compressive atelectasis at the left lung base. Hepatobiliary: Normal liver size and configuration. No hepatic steatosis. There are several scattered tiny subcentimeter simple liver cysts throughout the liver. No suspicious liver masses. Several irregular foci of arterial phase hyperenhancement throughout the liver are occult on all other sequences and are favored to represent benign transient perfusional phenomena. There is gallbladder distention. There are a few layering gallstones measuring up to 10 mm in size. There is moderate sludge in the gallbladder. There is mild diffuse gallbladder wall thickening without pericholecystic fluid. No intrahepatic biliary ductal dilatation. Common bile duct diameter 6 mm, within normal limits for age. There is ill-defined intermediate signal intensity material throughout the common bile duct compatible with sludge. No discrete stones in the bile ducts. No biliary or ampullary mass. Pancreas: Normal size pancreas. Normal pancreatic parenchymal signal intensity. No peripancreatic fluid collections. No pancreatic mass or duct dilation. No no evidence of pancreas divisum. Spleen: Normal size. No mass. Adrenals/Urinary Tract: No discrete adrenal nodules. No hydronephrosis. Normal kidneys with no renal mass. Stomach/Bowel: Grossly normal stomach. Visualized small and large bowel is normal caliber, with no bowel wall thickening. Vascular/Lymphatic: Atherosclerotic nonaneurysmal abdominal aorta . Patent portal, splenic, hepatic and renal  veins. No pathologically enlarged lymph nodes in the abdomen. Other: No abdominal ascites or focal fluid collection. Musculoskeletal: No aggressive appearing focal osseous lesions. IMPRESSION: 1. Cholelithiasis. Moderate gallbladder sludge. Diffuse gallbladder wall thickening. Gallbladder distention. No pericholecystic fluid. MRI findings are equivocal for acute cholecystitis and correlation with clinical exam, right upper quadrant abdominal sonogram and/ or hepatobiliary scintigraphy can be performed as clinically warranted. 2. No biliary ductal dilatation. Scattered sludge throughout the common bile duct without appreciable choledocholithiasis. No biliary mass or stricture. 3. No MRI findings of acute pancreatitis. No peripancreatic fluid collections. No pancreatic duct dilation. 4. Cardiomegaly. Small to moderate pericardial effusion/thickening, decreased since 08/05/2016 CT study. 5. Aortic atherosclerosis. Electronically Signed   By: Ilona Sorrel M.D.   On: 09/19/2016 15:07      Assessment: Elevated liver function test with associated small layering gallstones and some common bile duct sludge, LFTs improving and without biliary colic at present.  Plan: Indication for ERCP is soft at this point particularly in light of his age and comorbidities. Would get surgeries opinion at some point  as to whether he is a candidate for cholecystectomy and whether they feel it would be indicated. We'll continue to follow with you.    Jameela Michna C 09/20/2016, 11:10 AM  Pager [phone] If no answer or after 5 PM call [phone]

## 2016-09-20 NOTE — Consult Note (Signed)
[facility] Surgery Consult Note  Gregory [name] 03-12-1930  [id number].    Requesting MD: Charlies Silvers, M.D. Chief Complaint/Reason for Consult: gallstone pancreatitis, possible cholecystitis HPI:  [name] is an 83 year old male with multiple medical problems including PMH CVA (2015), STEMI (03/2016), prostate cancer, atrial fibrillation anticoagulated on Eliquis, HTN, gastric ulcers, and recent incarcerated left inguinal hernia repair (08/05/2016, Romana Juniper). who presented to the emergency room on 09-17-16 with chest and abdominal pain. Pain is described as epigastric, severe, burning, and intermittent. Non-radiating. Associated symptoms include vomiting and nausea. Unable to report if pain associated with meals. His pain subsided in the emergency department after the administration of morphine. He denies fever, chills, shortness of breath, heart palpitations, diaphoresis, or urinary symptoms.  ED workup was significant for a right upper quadrant ultrasound showing cholelithiasis and gallbladder sludge, no signs of cholecystitis, negative Murphy sign. AST 46, total bilirubin 1.8. All other LFTs within normal limits. Lipase 101. No leukocytosis. Initial troponin 0.5>>0.4>>0.5. Patient was admitted for further workup.   On hospital day #1 LFTs continued to increase AST 392, ALT 269, TB 2.2, ALP 163. Patient developed leukocytosis (16.1). Gastroenterology consult ordered an MRCP significant for gallbladder wall thickening and gallbladder distention. There was no biliary dilatation and there were no obvious gallstones in the common bile duct. No significant pancreatitis appreciable on MRCP. General surgery has been asked to assess for possible cholecystitis and gave surgical recommendations.   ROS: Review of Systems  Constitutional: Positive for weight loss. Negative for chills and fever.  Respiratory: Negative for cough and shortness of breath.   Cardiovascular: Positive for chest pain.  Negative for palpitations.  Gastrointestinal: Positive for abdominal pain, nausea and vomiting. Negative for blood in stool and melena.  All other systems reviewed and are negative.  Family History  Problem Relation Age of Onset  . Hypertension Mother     Past Medical History:  Diagnosis Date  . Arthritis    "legs, knees" (08/05/2016)  . CAD (coronary artery disease)    a. cath 03/2016: diffuse disease w/ 50% ostial LM stenosis, 75% ostial Cx, 50% 3rd Mrg, 65% distal RCA, and 50% RPDA. Medical management recommended.  Marland Kitchen GIB (gastrointestinal bleeding)    a. occurred in 2016, secondary to gastritis and diverticulosis  . History of gout   . History of stomach ulcers   . Hypercholesteremia   . Hypertension   . Hypertensive heart disease   . LVH (left ventricular hypertrophy)    a. 03/2016 Echo: EF 55-65%, no rwma, Gr1 DD, sev LVH, mildly dil RA/LA, small peric eff;  b. 03/2016 Cardiac MRI: sev LVH, mildly dil RV, possible RV thrombus, diffuse LGE involving RV and diff subendocardial LGE in LV. *No M spike on SPEP, nl immunofixation pattern - no clear evidence of cardiac amyloidosis.  . Murmur, cardiac   . PAF (paroxysmal atrial fibrillation) ([place])    a. new-onset 03/2016, placed on Eliquis (CHA2DS2VASc = 6).  . Pneumonia    "quite a few times" (08/05/2016)  . Poor appetite   . Prostate cancer ([place])   . STEMI (ST elevation myocardial infarction) ([place]) 03/2016   Archie Endo 03/26/2016  . Stroke [facility])    a. nonhemorrhagic CVA in 2015    Past Surgical History:  Procedure Laterality Date  . CARDIAC CATHETERIZATION N/A 03/26/2016   Procedure: Left Heart Cath and Coronary Angiography;  Surgeon: Troy Sine, MD;  Location: [place] CV LAB;  Service: Cardiovascular;  Laterality: N/A;  . CARDIAC CATHETERIZATION N/A 08/06/2016  Procedure: Pericardiocentesis;  Surgeon: Sherren Mocha, MD;  Location: [place] CV LAB;  Service: Cardiovascular;  Laterality: N/A;  . COLONOSCOPY WITH PROPOFOL  N/A 10/04/2014   Procedure: COLONOSCOPY WITH PROPOFOL;  Surgeon: Laurence Spates, MD;  Location: [place];  Service: Endoscopy;  Laterality: N/A;  . DIAGNOSTIC LAPAROSCOPY  08/05/2016   reduction of strangulated small bowel from left inguinal hernia, TAPP repair with phasix mesh, small bowel resection/notes 08/05/2016  . ESOPHAGOGASTRODUODENOSCOPY N/A 10/03/2014   Procedure: ESOPHAGOGASTRODUODENOSCOPY (EGD);  Surgeon: Winfield Cunas., MD;  Location: [facility] ENDOSCOPY;  Service: Endoscopy;  Laterality: N/A;  . HERNIA REPAIR    . LAPAROSCOPY N/A 08/05/2016   Procedure: LAPAROSCOPY DIAGNOSTIC AND LAPAROSCOPIC INGUINAL HERNIA REPAIR.;  Surgeon: Clovis Riley, MD;  Location: [place];  Service: General;  Laterality: N/A;  . LAPAROTOMY N/A 08/05/2016   Procedure: Anne Fu WITH  SMALL BOWEL RESECTION.;  Surgeon: Clovis Riley, MD;  Location: [place];  Service: General;  Laterality: N/A;  . PROSTATECTOMY  1990s?    Social History:  reports that he has never smoked. He has never used smokeless tobacco. He reports that he does not drink alcohol or use drugs.  Allergies:  Allergies  Allergen Reactions  . Sulfa Antibiotics     Reaction not noted on MAR  . Ramipril     Reaction not noted on MAR  . Ramipril Other (See Comments)    Reaction not noted on MAR  . Sulfamethoxazole Rash    Medications Prior to Admission  Medication Sig Dispense Refill  . amiodarone (PACERONE) 400 MG tablet Take 1 tablet (400 mg total) by mouth daily.    Marland Kitchen apixaban (ELIQUIS) 5 MG TABS tablet Take 1 tablet (5 mg total) by mouth 2 (two) times daily. 60 tablet 10  . atorvastatin (LIPITOR) 40 MG tablet Take 1 tablet (40 mg total) by mouth daily. 90 tablet 3  . barrier cream (NON-SPECIFIED) CREA Apply 1 application topically See admin instructions. Two times a day to both buttocks    . bisacodyl (DULCOLAX) 10 MG suppository Place 10 mg rectally once as needed for mild constipation. IF NO RELIEF FROM MILK OF  MAGNESIA    . brimonidine (ALPHAGAN) 0.2 % ophthalmic solution Place 1 drop into both eyes at bedtime.    Marland Kitchen dextromethorphan-guaiFENesin (MUCINEX DM) 30-600 MG 12hr tablet Take 1 tablet by mouth every 12 (twelve) hours.    . magnesium hydroxide (MILK OF MAGNESIA) 400 MG/5ML suspension Take 30 mLs by mouth once as needed for mild constipation.    . metoprolol (LOPRESSOR) 100 MG tablet Take 1 tablet (100 mg total) by mouth 2 (two) times daily.    . Multiple Vitamin (MULTIVITAMIN WITH MINERALS) TABS tablet Take 1 tablet by mouth daily.    . NON FORMULARY "Magic Cup": Two times a day    . NON FORMULARY MedPass: 120 ml's by mouth two times a day    . ondansetron (ZOFRAN-ODT) 4 MG disintegrating tablet Take 1 tablet (4 mg total) by mouth every 6 (six) hours as needed for nausea. (Patient taking differently: Take 4 mg by mouth every 6 (six) hours as needed for nausea. DISSOLVE IN THE MOUTH) 20 tablet 0  . oseltamivir (TAMIFLU) 30 MG capsule Take 30 mg by mouth daily. FOR 10 DAYS    . pantoprazole (PROTONIX) 40 MG tablet Take 1 tablet (40 mg total) by mouth 2 (two) times daily.    . polyethylene glycol (MIRALAX / GLYCOLAX) packet Take 17 g by mouth daily as  needed for mild constipation or moderate constipation. 14 each 0  . senna (SENOKOT) 8.6 MG TABS tablet Take 1 tablet by mouth daily.    . sertraline (ZOLOFT) 50 MG tablet Take 50 mg by mouth daily.     . Sodium Phosphates (RA SALINE ENEMA) 19-7 GM/118ML ENEM Place 1 enema rectally once as needed (for constipation not relieved by Dulcolax suppository). AND CALL DOCTOR I STILL NO RELIEF AFTER ENEMA    . traMADol (ULTRAM) 50 MG tablet Take 0.5 tablets (25 mg total) by mouth every 8 (eight) hours. (control) (Patient taking differently: Take 25 mg by mouth every 8 (eight) hours as needed (for pain). (control)) 60 tablet 0  . colchicine 0.6 MG tablet Take 1 tablet (0.6 mg total) by mouth daily. (Patient not taking: Reported on 09/17/2016)    . feeding  supplement, ENSURE ENLIVE, (ENSURE ENLIVE) LIQD Take 237 mLs by mouth 2 (two) times daily between meals. (Patient not taking: Reported on 09/17/2016) 237 mL 12  . mirtazapine (REMERON SOL-TAB) 15 MG disintegrating tablet Take 0.5 tablets (7.5 mg total) by mouth at bedtime. (Patient not taking: Reported on 09/01/2016)    . oxyCODONE (OXY IR/ROXICODONE) 5 MG immediate release tablet Take 1 tablet (5 mg total) by mouth every 6 (six) hours as needed for severe pain. (Patient not taking: Reported on 09/17/2016) 10 tablet 0  . senna-docusate (SENOKOT-S) 8.6-50 MG tablet Take 1 tablet by mouth 2 (two) times daily. (Patient not taking: Reported on 09/17/2016)      Blood pressure 119/64, pulse 67, temperature 98 F (36.7 C), temperature source Oral, resp. rate 17, weight 59.2 kg (130 lb 8.2 oz), SpO2 98 %. Physical Exam: General: pleasant, ill-appearing and cachectic AA male who is laying in bed in NAD HEENT: head is normocephalic, atraumatic.  Sclera are noninjected.  Heart: regular, rate, and rhythm.  No obvious murmurs, gallops, or rubs noted.  Palpable pedal pulses bilaterally Lungs: CTAB, no wheezes, rhonchi, or rales noted.  Respiratory effort nonlabored Abd: soft, non-tender, non-distended, +BS, previous surgical scar noted. MS: all 4 extremities are symmetrical with no cyanosis, clubbing, or edema. Skin: warm and dry with no masses, lesions, or rashes Psych: A&Ox3 with an appropriate affect. Neuro: CM 2-12 intact, extremity CSM intact bilaterally, normal speech    Results for orders placed or performed during the hospital encounter of 09/17/16 (from the past 48 hour(s))  Culture, blood (routine x 2)     Status: Abnormal (Preliminary result)   Collection Time: 09/18/16  8:25 PM  Result Value Ref Range   Specimen Description BLOOD LEFT ARM    Special Requests BOTTLES DRAWN AEROBIC ONLY  8CC    Culture  Setup Time      GRAM NEGATIVE RODS AEROBIC BOTTLE ONLY CRITICAL RESULT CALLED TO, READ BACK BY  AND VERIFIED WITH: EHassell Done, PHARM, 09/19/16 AT 65 BY J FUDESCO    Culture KLEBSIELLA OXYTOCA (A)    Report Status PENDING   Blood Culture ID Panel (Reflexed)     Status: Abnormal   Collection Time: 09/18/16  8:25 PM  Result Value Ref Range   Enterococcus species NOT DETECTED NOT DETECTED   Listeria monocytogenes NOT DETECTED NOT DETECTED   Staphylococcus species NOT DETECTED NOT DETECTED   Staphylococcus aureus NOT DETECTED NOT DETECTED   Streptococcus species NOT DETECTED NOT DETECTED   Streptococcus agalactiae NOT DETECTED NOT DETECTED   Streptococcus pneumoniae NOT DETECTED NOT DETECTED   Streptococcus pyogenes NOT DETECTED NOT DETECTED   Acinetobacter baumannii NOT  DETECTED NOT DETECTED   Enterobacteriaceae species DETECTED (A) NOT DETECTED    Comment: Enterobacteriaceae represent a large family of gram-negative bacteria, not a single organism. CRITICAL RESULT CALLED TO, READ BACK BY AND VERIFIED WITH: E. MARTIN, 09/19/16 AT 1428 BY J FUDESCO    Enterobacter cloacae complex NOT DETECTED NOT DETECTED   Escherichia coli NOT DETECTED NOT DETECTED   Klebsiella oxytoca DETECTED (A) NOT DETECTED    Comment: CRITICAL RESULT CALLED TO, READ BACK BY AND VERIFIED WITH: E. MARTIN, 09/19/16 AT 1428 BY J FUDESCO    Klebsiella pneumoniae NOT DETECTED NOT DETECTED   Proteus species NOT DETECTED NOT DETECTED   Serratia marcescens NOT DETECTED NOT DETECTED   Carbapenem resistance NOT DETECTED NOT DETECTED   Haemophilus influenzae NOT DETECTED NOT DETECTED   Neisseria meningitidis NOT DETECTED NOT DETECTED   Pseudomonas aeruginosa NOT DETECTED NOT DETECTED   Candida albicans NOT DETECTED NOT DETECTED   Candida glabrata NOT DETECTED NOT DETECTED   Candida krusei NOT DETECTED NOT DETECTED   Candida parapsilosis NOT DETECTED NOT DETECTED   Candida tropicalis NOT DETECTED NOT DETECTED  Culture, blood (routine x 2)     Status: Abnormal (Preliminary result)   Collection Time: 09/18/16  8:34 PM   Result Value Ref Range   Specimen Description BLOOD RIGHT HAND    Special Requests IN PEDIATRIC BOTTLE  3CC    Culture  Setup Time      GRAM NEGATIVE RODS IN PEDIATRIC BOTTLE CRITICAL RESULT CALLED TO, READ BACK BY AND VERIFIED WITH: E. MARTIN, PHARM, 09/19/16 AT 38 BY J FUDESCO    Culture KLEBSIELLA OXYTOCA SUSCEPTIBILITIES TO FOLLOW  (A)    Report Status PENDING   Blood Culture ID Panel (Reflexed)     Status: Abnormal   Collection Time: 09/18/16  8:34 PM  Result Value Ref Range   Enterococcus species NOT DETECTED NOT DETECTED   Listeria monocytogenes NOT DETECTED NOT DETECTED   Staphylococcus species NOT DETECTED NOT DETECTED   Staphylococcus aureus NOT DETECTED NOT DETECTED   Streptococcus species NOT DETECTED NOT DETECTED   Streptococcus agalactiae NOT DETECTED NOT DETECTED   Streptococcus pneumoniae NOT DETECTED NOT DETECTED   Streptococcus pyogenes NOT DETECTED NOT DETECTED   Acinetobacter baumannii NOT DETECTED NOT DETECTED   Enterobacteriaceae species DETECTED (A) NOT DETECTED    Comment: Enterobacteriaceae represent a large family of gram-negative bacteria, not a single organism. CRITICAL RESULT CALLED TO, READ BACK BY AND VERIFIED WITH: E. MARTIN, PHARM, 09/19/16 AT 1428 BY J FUDESCO    Enterobacter cloacae complex NOT DETECTED NOT DETECTED   Escherichia coli NOT DETECTED NOT DETECTED   Klebsiella oxytoca DETECTED (A) NOT DETECTED    Comment: CRITICAL RESULT CALLED TO, READ BACK BY AND VERIFIED WITH: E. MARTIN , PHARM, 09/19/16 AT 1428 BY J FUDESCO    Klebsiella pneumoniae NOT DETECTED NOT DETECTED   Proteus species NOT DETECTED NOT DETECTED   Serratia marcescens NOT DETECTED NOT DETECTED   Carbapenem resistance NOT DETECTED NOT DETECTED   Haemophilus influenzae NOT DETECTED NOT DETECTED   Neisseria meningitidis NOT DETECTED NOT DETECTED   Pseudomonas aeruginosa NOT DETECTED NOT DETECTED   Candida albicans NOT DETECTED NOT DETECTED   Candida glabrata NOT DETECTED  NOT DETECTED   Candida krusei NOT DETECTED NOT DETECTED   Candida parapsilosis NOT DETECTED NOT DETECTED   Candida tropicalis NOT DETECTED NOT DETECTED  Comprehensive metabolic panel     Status: Abnormal   Collection Time: 09/19/16  3:10 AM  Result Value Ref  Range   Sodium 138 135 - 145 mmol/L   Potassium 4.3 3.5 - 5.1 mmol/L   Chloride 107 101 - 111 mmol/L   CO2 23 22 - 32 mmol/L   Glucose, Bld 104 (H) 65 - 99 mg/dL   BUN 14 6 - 20 mg/dL   Creatinine, Ser 0.89 0.61 - 1.24 mg/dL   Calcium 8.8 (L) 8.9 - 10.3 mg/dL   Total Protein 5.5 (L) 6.5 - 8.1 g/dL   Albumin 2.5 (L) 3.5 - 5.0 g/dL   AST 182 (H) 15 - 41 U/L   ALT 187 (H) 17 - 63 U/L   Alkaline Phosphatase 152 (H) 38 - 126 U/L   Total Bilirubin 2.7 (H) 0.3 - 1.2 mg/dL   GFR calc non Af Amer >60 >60 mL/min   GFR calc Af Amer >60 >60 mL/min    Comment: (NOTE) The eGFR has been calculated using the CKD EPI equation. This calculation has not been validated in all clinical situations. eGFR's persistently <60 mL/min signify possible Chronic Kidney Disease.    Anion gap 8 5 - 15  Lipase, blood     Status: Abnormal   Collection Time: 09/19/16  3:10 AM  Result Value Ref Range   Lipase 58 (H) 11 - 51 U/L  CBC with Differential/Platelet     Status: Abnormal   Collection Time: 09/19/16  4:07 PM  Result Value Ref Range   WBC 7.6 4.0 - 10.5 K/uL   RBC 4.45 4.22 - 5.81 MIL/uL   Hemoglobin 12.3 (L) 13.0 - 17.0 g/dL   HCT 37.6 (L) 39.0 - 52.0 %   MCV 84.5 78.0 - 100.0 fL   MCH 27.6 26.0 - 34.0 pg   MCHC 32.7 30.0 - 36.0 g/dL   RDW 16.8 (H) 11.5 - 15.5 %   Platelets 85 (L) 150 - 400 K/uL    Comment: CONSISTENT WITH PREVIOUS RESULT   Neutrophils Relative % 77 %   Lymphocytes Relative 14 %   Monocytes Relative 9 %   Eosinophils Relative 0 %   Basophils Relative 0 %   Neutro Abs 5.8 1.7 - 7.7 K/uL   Lymphs Abs 1.1 0.7 - 4.0 K/uL   Monocytes Absolute 0.7 0.1 - 1.0 K/uL   Eosinophils Absolute 0.0 0.0 - 0.7 K/uL   Basophils Absolute  0.0 0.0 - 0.1 K/uL   RBC Morphology MIXED RBC POPULATION   Basic metabolic panel     Status: Abnormal   Collection Time: 09/20/16  8:11 AM  Result Value Ref Range   Sodium 140 135 - 145 mmol/L   Potassium 4.2 3.5 - 5.1 mmol/L   Chloride 107 101 - 111 mmol/L   CO2 25 22 - 32 mmol/L   Glucose, Bld 92 65 - 99 mg/dL   BUN 10 6 - 20 mg/dL   Creatinine, Ser 0.88 0.61 - 1.24 mg/dL   Calcium 8.4 (L) 8.9 - 10.3 mg/dL   GFR calc non Af Amer >60 >60 mL/min   GFR calc Af Amer >60 >60 mL/min    Comment: (NOTE) The eGFR has been calculated using the CKD EPI equation. This calculation has not been validated in all clinical situations. eGFR's persistently <60 mL/min signify possible Chronic Kidney Disease.    Anion gap 8 5 - 15  Hepatic function panel     Status: Abnormal   Collection Time: 09/20/16  8:11 AM  Result Value Ref Range   Total Protein 5.1 (L) 6.5 - 8.1 g/dL   Albumin 2.2 (  L) 3.5 - 5.0 g/dL   AST 62 (H) 15 - 41 U/L   ALT 101 (H) 17 - 63 U/L   Alkaline Phosphatase 126 38 - 126 U/L   Total Bilirubin 1.6 (H) 0.3 - 1.2 mg/dL   Bilirubin, Direct 0.7 (H) 0.1 - 0.5 mg/dL   Indirect Bilirubin 0.9 0.3 - 0.9 mg/dL  Lipase, blood     Status: Abnormal   Collection Time: 09/20/16  8:11 AM  Result Value Ref Range   Lipase 58 (H) 11 - 51 U/L  CBC with Differential/Platelet     Status: Abnormal   Collection Time: 09/20/16  8:11 AM  Result Value Ref Range   WBC 7.3 4.0 - 10.5 K/uL   RBC 3.93 (L) 4.22 - 5.81 MIL/uL   Hemoglobin 10.7 (L) 13.0 - 17.0 g/dL   HCT 33.2 (L) 39.0 - 52.0 %   MCV 84.5 78.0 - 100.0 fL   MCH 27.2 26.0 - 34.0 pg   MCHC 32.2 30.0 - 36.0 g/dL   RDW 16.4 (H) 11.5 - 15.5 %   Platelets 99 (L) 150 - 400 K/uL    Comment: CONSISTENT WITH PREVIOUS RESULT   Neutrophils Relative % 71 %   Neutro Abs 5.2 1.7 - 7.7 K/uL   Lymphocytes Relative 16 %   Lymphs Abs 1.2 0.7 - 4.0 K/uL   Monocytes Relative 13 %   Monocytes Absolute 0.9 0.1 - 1.0 K/uL   Eosinophils Relative 0 %    Eosinophils Absolute 0.0 0.0 - 0.7 K/uL   Basophils Relative 0 %   Basophils Absolute 0.0 0.0 - 0.1 K/uL   Mr 3d Recon At Scanner  Result Date: 09/19/2016 CLINICAL DATA:  83 year old male inpatient admitted with upper abdominal pain with elevated liver function tests and diagnosed with acute pancreatitis. Leukocytosis. Cholelithiasis on abdominal sonogram. Status post incarcerated left inguinal hernia repair in December 2017. EXAM: MRI ABDOMEN WITHOUT AND WITH CONTRAST (INCLUDING MRCP) TECHNIQUE: Multiplanar multisequence MR imaging of the abdomen was performed both before and after the administration of intravenous contrast. Heavily T2-weighted images of the biliary and pancreatic ducts were obtained, and three-dimensional MRCP images were rendered by post processing. CONTRAST:  25m MULTIHANCE GADOBENATE DIMEGLUMINE 529 MG/ML IV SOLN COMPARISON:  09/17/2016 right upper quadrant abdominal sonogram. 08/05/2016 CT abdomen/pelvis. FINDINGS: Significantly motion degraded scan. Lower chest: Cardiomegaly. Small to moderate pericardial effusion/thickening, decreased since 08/05/2016 CT study. Mild compressive atelectasis at the left lung base. Hepatobiliary: Normal liver size and configuration. No hepatic steatosis. There are several scattered tiny subcentimeter simple liver cysts throughout the liver. No suspicious liver masses. Several irregular foci of arterial phase hyperenhancement throughout the liver are occult on all other sequences and are favored to represent benign transient perfusional phenomena. There is gallbladder distention. There are a few layering gallstones measuring up to 10 mm in size. There is moderate sludge in the gallbladder. There is mild diffuse gallbladder wall thickening without pericholecystic fluid. No intrahepatic biliary ductal dilatation. Common bile duct diameter 6 mm, within normal limits for age. There is ill-defined intermediate signal intensity material throughout the common  bile duct compatible with sludge. No discrete stones in the bile ducts. No biliary or ampullary mass. Pancreas: Normal size pancreas. Normal pancreatic parenchymal signal intensity. No peripancreatic fluid collections. No pancreatic mass or duct dilation. No no evidence of pancreas divisum. Spleen: Normal size. No mass. Adrenals/Urinary Tract: No discrete adrenal nodules. No hydronephrosis. Normal kidneys with no renal mass. Stomach/Bowel: Grossly normal stomach. Visualized small and large bowel  is normal caliber, with no bowel wall thickening. Vascular/Lymphatic: Atherosclerotic nonaneurysmal abdominal aorta . Patent portal, splenic, hepatic and renal veins. No pathologically enlarged lymph nodes in the abdomen. Other: No abdominal ascites or focal fluid collection. Musculoskeletal: No aggressive appearing focal osseous lesions. IMPRESSION: 1. Cholelithiasis. Moderate gallbladder sludge. Diffuse gallbladder wall thickening. Gallbladder distention. No pericholecystic fluid. MRI findings are equivocal for acute cholecystitis and correlation with clinical exam, right upper quadrant abdominal sonogram and/ or hepatobiliary scintigraphy can be performed as clinically warranted. 2. No biliary ductal dilatation. Scattered sludge throughout the common bile duct without appreciable choledocholithiasis. No biliary mass or stricture. 3. No MRI findings of acute pancreatitis. No peripancreatic fluid collections. No pancreatic duct dilation. 4. Cardiomegaly. Small to moderate pericardial effusion/thickening, decreased since 08/05/2016 CT study. 5. Aortic atherosclerosis. Electronically Signed   By: Ilona Sorrel M.D.   On: 09/19/2016 15:07   Mr Abdomen Mrcp Moise Boring Contast  Result Date: 09/19/2016 CLINICAL DATA:  83 year old male inpatient admitted with upper abdominal pain with elevated liver function tests and diagnosed with acute pancreatitis. Leukocytosis. Cholelithiasis on abdominal sonogram. Status post incarcerated left  inguinal hernia repair in December 2017. EXAM: MRI ABDOMEN WITHOUT AND WITH CONTRAST (INCLUDING MRCP) TECHNIQUE: Multiplanar multisequence MR imaging of the abdomen was performed both before and after the administration of intravenous contrast. Heavily T2-weighted images of the biliary and pancreatic ducts were obtained, and three-dimensional MRCP images were rendered by post processing. CONTRAST:  29m MULTIHANCE GADOBENATE DIMEGLUMINE 529 MG/ML IV SOLN COMPARISON:  09/17/2016 right upper quadrant abdominal sonogram. 08/05/2016 CT abdomen/pelvis. FINDINGS: Significantly motion degraded scan. Lower chest: Cardiomegaly. Small to moderate pericardial effusion/thickening, decreased since 08/05/2016 CT study. Mild compressive atelectasis at the left lung base. Hepatobiliary: Normal liver size and configuration. No hepatic steatosis. There are several scattered tiny subcentimeter simple liver cysts throughout the liver. No suspicious liver masses. Several irregular foci of arterial phase hyperenhancement throughout the liver are occult on all other sequences and are favored to represent benign transient perfusional phenomena. There is gallbladder distention. There are a few layering gallstones measuring up to 10 mm in size. There is moderate sludge in the gallbladder. There is mild diffuse gallbladder wall thickening without pericholecystic fluid. No intrahepatic biliary ductal dilatation. Common bile duct diameter 6 mm, within normal limits for age. There is ill-defined intermediate signal intensity material throughout the common bile duct compatible with sludge. No discrete stones in the bile ducts. No biliary or ampullary mass. Pancreas: Normal size pancreas. Normal pancreatic parenchymal signal intensity. No peripancreatic fluid collections. No pancreatic mass or duct dilation. No no evidence of pancreas divisum. Spleen: Normal size. No mass. Adrenals/Urinary Tract: No discrete adrenal nodules. No hydronephrosis.  Normal kidneys with no renal mass. Stomach/Bowel: Grossly normal stomach. Visualized small and large bowel is normal caliber, with no bowel wall thickening. Vascular/Lymphatic: Atherosclerotic nonaneurysmal abdominal aorta . Patent portal, splenic, hepatic and renal veins. No pathologically enlarged lymph nodes in the abdomen. Other: No abdominal ascites or focal fluid collection. Musculoskeletal: No aggressive appearing focal osseous lesions. IMPRESSION: 1. Cholelithiasis. Moderate gallbladder sludge. Diffuse gallbladder wall thickening. Gallbladder distention. No pericholecystic fluid. MRI findings are equivocal for acute cholecystitis and correlation with clinical exam, right upper quadrant abdominal sonogram and/ or hepatobiliary scintigraphy can be performed as clinically warranted. 2. No biliary ductal dilatation. Scattered sludge throughout the common bile duct without appreciable choledocholithiasis. No biliary mass or stricture. 3. No MRI findings of acute pancreatitis. No peripancreatic fluid collections. No pancreatic duct dilation. 4. Cardiomegaly. Small to  moderate pericardial effusion/thickening, decreased since 08/05/2016 CT study. 5. Aortic atherosclerosis. Electronically Signed   By: Ilona Sorrel M.D.   On: 09/19/2016 15:07    Assessment/Plan Symptomatic cholelithiasis r/o cholecystitis Abnormal LFT's  - RUQ U/S w/ cholelithiasis, sludge, no signs cholecystitis - MRCP suggestive of possible cholecystitis (gallbladder wall thickening and distention, no pericholecystic fluid); gallbladder sludge in CBD, without obvious stones. - Lipase 58 from 101 - AST 62, ALT 101, AP 126, T.bili 1.6  Bacteremia secondary to klebsiella oxytoca  Leukocytosis - resolved on IV abx  CAD PAF anticoagulated on Eliquis PMH pericardial effusion  PMH MI 03/2016 PMH CVA 2015 HTN Dyslipidemia  Depression  Influenza - tamiflu prior to admission, continued during hospitalization  FEN: DYS 1 ID:  Rocephin/Flagyl  VTE: Eliquis (last dose this morning at 0825)  Plan: Continue empiric IV abx. Will make NPO after MN and order HIDA scan to r/o cholecystitis. Given patients age and comorbidities he would be a high risk surgical candidate. If HIDA shows cystic duct occlusion then percutaneous cholecystostomy tube would be recommended, with possible interval cholecystectomy to follow. Eliquis will need to be held according to IR specifications prior to any procedures.  Echocardiogram pending. Patient had a cardiac catheterization 08/06/16 that revealed a 50% ostial left main, 75% circumflex, 50% third marginal, 65% distal right coronary artery and 50% PDA.  Jill Alexanders, [facility] Surgery 09/20/2016, 3:01 PM Pager: [phone] Consults: [phone] Mon-Fri 7:00 am-4:30 pm Sat-Sun 7:00 am-11:30 am

## 2016-09-20 NOTE — Progress Notes (Signed)
Daily Progress Note   Patient Name: Gregory [name]       Date: 09/20/2016 DOB: 17-Apr-1930  Age: 83 y.o. MRN#: [id number] Attending Physician: Robbie Lis, MD Primary Care Physician: [name], MD Admit Date: 09/17/2016  Reason for Consultation/Follow-up: Establishing goals of care and Psychosocial/spiritual support  Subjective: - with permission from patient continued discussion with Ms Rod Holler  [place]/ niece and main support person but lives in [place] regarding current medical situation, GOCs disposition and options  - Rod Holler expresses concern that it is unlikely that as a family they will be able to support Mr Coit at home once discharged even though this is want the patient wants   Length of Stay: 1  Current Medications: Scheduled Meds:  . amiodarone  400 mg Oral Daily  . apixaban  2.5 mg Oral BID  . atorvastatin  40 mg Oral Daily  . brimonidine  1 drop Both Eyes QHS  . cefTRIAXone (ROCEPHIN)  IV  2 g Intravenous Q24H  . Chlorhexidine Gluconate Cloth  6 each Topical Daily  . feeding supplement (ENSURE ENLIVE)  237 mL Oral TID BM  . metoprolol  100 mg Oral BID  . metronidazole  500 mg Intravenous Q8H  . multivitamin with minerals  1 tablet Oral Daily  . mupirocin ointment  1 application Nasal BID  . oseltamivir  30 mg Oral BID  . pantoprazole  40 mg Oral BID  . senna  1 tablet Oral Daily  . sertraline  50 mg Oral Daily    Continuous Infusions: . sodium chloride 0.9 % 1,000 mL infusion 125 mL/hr at 09/19/16 1452    PRN Meds: ondansetron, oxyCODONE, polyethylene glycol  Physical Exam  Constitutional: He is oriented to person, place, and time. He appears cachectic. He appears ill.  Cardiovascular: Normal rate, regular rhythm and normal heart sounds.     Pulmonary/Chest: He has decreased breath sounds in the right lower field and the left lower field.  Musculoskeletal:  generalized weakness and atrophy  Neurological: He is alert and oriented to person, place, and time.  Skin: Skin is warm and dry.            Vital Signs: BP 119/64 (BP Location: Right Arm)   Pulse 67   Temp 98 F (36.7 C) (Oral)   Resp 17  Wt 59.2 kg (130 lb 8.2 oz)   SpO2 98%   BMI 18.20 kg/m  SpO2: SpO2: 98 % O2 Device: O2 Device: Not Delivered O2 Flow Rate:    Intake/output summary:  Intake/Output Summary (Last 24 hours) at 09/20/16 1244 Last data filed at 09/20/16 1051  Gross per 24 hour  Intake           912.67 ml  Output              700 ml  Net           212.67 ml   LBM: Last BM Date: 09/16/16 Baseline Weight: Weight: 59.2 kg (130 lb 8.2 oz) Most recent weight: Weight: 59.2 kg (130 lb 8.2 oz)       Palliative Assessment/Data:   30 %    Flowsheet Rows   Flowsheet Row Most Recent Value  Intake Tab  Referral Department  Cardiology  Unit at Time of Referral  Orthopedic Unit  Palliative Care Primary Diagnosis  Cardiac  Date Notified  09/18/16  Palliative Care Type  New Palliative care  Reason for referral  Clarify Goals of Care  Date of Admission  09/17/16  Date first seen by Palliative Care  09/19/16  # of days Palliative referral response time  1 Day(s)  # of days IP prior to Palliative referral  1  Clinical Assessment  Palliative Performance Scale Score  30%  Psychosocial & Spiritual Assessment  Palliative Care Outcomes      Patient Active Problem List   Diagnosis Date Noted  . DNR (do not resuscitate) discussion 09/19/2016  . Palliative care by specialist 09/19/2016  . Bacteremia due to Gram-negative bacteria 09/19/2016  . Cholelithiases 09/19/2016  . Gallstone pancreatitis: Probable 09/19/2016  . Biliary colic: Probable [id number]  . Abnormal LFTs   . Pressure injury of skin 09/18/2016  . Protein-calorie malnutrition, severe  09/18/2016  . Abdominal pain 09/17/2016  . Adult failure to thrive 08/24/2016  . Persistent atrial fibrillation ([place])   . Anticoagulated   . Difficulty in walking, not elsewhere classified   . Melanotic stools 08/14/2016  . Postoperative anemia due to acute blood loss 08/14/2016  . Pericardial effusion   . Hypokalemia   . Malnutrition of moderate degree 08/06/2016  . Hypotension   . Cardiac tamponade   . Chronic diastolic congestive heart failure ([place]) 08/05/2016  . Incarcerated inguinal hernia 08/05/2016  . Incarcerated left inguinal hernia 08/05/2016  . Hypercholesteremia   . Hypertensive heart disease   . Coronary artery disease   . LVH (left ventricular hypertrophy)   . PAF (paroxysmal atrial fibrillation) ([place])   . NSTEMI (non-ST elevated myocardial infarction) ([place]) 03/26/2016  . Acute coronary syndrome ([place])   . GI bleed 10/03/2014  . H/O: CVA (cerebrovascular accident)   . CVA (cerebral infarction) 01/30/2014  . Right arm weakness 01/30/2014  . Hypertension 12/05/2010  . Hyperlipidemia 12/05/2010    Palliative Care Assessment & Plan    Assessment: 83 y.o.malewith medical history significant forSTEMI in 2017, prostate Ca, paroxysmal atrial fibrillation on apixaban, hypertension, gastric ulcers and GI bleed, CAD, CVA in 2015 (left cerebral hemisphere CVA on MRI)  Continued physical and functional decline, lived alone a few months ago and wishes to retun home but with little family /social support  Currently being treated for bacteremia, protein calorie malnutrition, high risk for decompensation.  Recommendations/Plan:  Continued support for patient and family as they navigate treatment options and care needs   Goals of Care and Additional  Recommendations:  Patient is open to all offered and available medical interventions to prolong life  Code Status:  Encouraged patient to consider DNR status knowing poor outcomes in similar patients.      Code  Status Orders        Start     Ordered   09/18/16 0049  Full code  Continuous     09/18/16 0048    Code Status History    Date Active Date Inactive Code Status Order ID Comments User Context   08/05/2016 12:19 PM 08/22/2016  6:38 PM Full Code [id number]  Kalman Drape, [facility]   03/26/2016 10:44 AM 04/01/2016  5:34 PM Full Code [id number]  Troy Sine, MD Inpatient   10/03/2014  3:16 AM 10/04/2014  6:54 PM Full Code [id number]  Orvan Falconer, MD ED   01/30/2014  9:26 PM 02/01/2014  4:19 PM Full Code [id number]  Robbie Lis, MD Inpatient       Prognosis:   Unable to determine  Discharge Planning:  To Be Determined    Thank you for allowing the Palliative Medicine Team to assist in the care of this patient.   Time In: 1050 Time Out: 1115 Total Time 25 min Prolonged Time Billed  no       Greater than 50%  of this time was spent counseling and coordinating care related to the above assessment and plan.  Wadie Lessen, NP  Please contact Palliative Medicine Team phone at [phone] for questions and concerns.

## 2016-09-20 NOTE — Progress Notes (Addendum)
Patient ID: Gregory [name], male   DOB: 1930-04-03, 83 y.o.   MRN: [id number]  PROGRESS NOTE    Gregory [name]  [id number] DOB: Mar 21, 1930 DOA: 09/17/2016  PCP: [name], MD   Brief Narrative:  83 y.o.malewith medical history significant forSTEMI in 2017, prostate Ca, paroxysmal atrial fibrillation on apixaban, hypertension, gastric ulcers and GI bleed, CAD, CVA in 2015 (left cerebral hemisphere CVA on MRI)  Of note, previous hospitalization from 08/05/2016 --> 08/22/2016 for incarcerated hernia and underwent surgery 08/05/2016 and then developed large pericardial effusion with evidence of tamponade and he underwent pericardiocentesis 08/06/2016. Then he developed afib and was started on BB and amiodarone. Drain was finally removed 12/24 and 2 D ECHO later 12/29 showed moderate effusion without tamponade. Please note that during that hospitalization palliative care was also consulted for goals of care and was supposed to continue to follow while pt in SNF.  Pt presented to [facility] with chest and abdominal pain, initially felt as burning type of pain and it was 7-8/10 in intensity, intermittent and radiating to suprapubic area. He did not tolerate much PO intake and even sips of water made him nauseous. He reported 2 episodes of non bloody vomiting. Pt reported pain was better after morphine.  BP on admission was 101/55, HR 91, normal temp, oxygen saturation was 98-100% on room air. Blood work was notable for Cr of 1.29 , lipase was 101, AST was 46 and repeat number 392, ALT was WNL and then 269, ALP was normal and repeat lab 163 and Bilirubin was 1.8 -->2.2. Initial troponin was 0.05, lactic acid 2.32. FOBT was negative. He had a RUQ [place] which showed cholelithiasis and GB sludge, no cholecystitis.    Assessment & Plan:   Abdominal pain / Abnormal LFT's likely secondary to gallstone pancreatitis versus biliary colic - Patient presented with abdominal pain epigastric in nature with  some nausea and emesis prior to admission. Concern for possible passed gallstone. - LFT's initially WNL and then up AST 392, ALT 269, ALP 163 and Bili 2.2. Patient also noted to have a elevated lipase of 101. - Abdominal ultrasound done and showed cholelithiasis with gallbladder sludge. No other sonographic features to suggest acute cholecystitis. No biliary dilatation.  - No acute findings on abd x ray - LFTs today with a AST of 182 from 392 and ALT of 187 from 269. Lipase level of 58 from 101 - MRCP obtained with cholelithiasis, moderate gallbladder sludge, diffuse gallbladder wall thickening, gallbladder distention, no pericholecystic fluid. MRI findings equivocal for acute cholecystitis and correlate with clinical exam, right upper quadrant abdominal sonogram and/or hepatobiliary scintigraphy. No biliary ductal dilatation.  - GI has seen the pt in consultation, may need ERCP but wanted to see surgery opinion first  - Appreciate surgery consult and recommendations   Bacteremia secondary to Klebsiella oxytoca / Leukocytosis / Lactic acidosis / Sepsis  - Blood cx on admission grew Klebsiella Oxytoca - Repeat blood cx today to ensure resolution of bacteremia - Placed on rocephin and flagyl as pt presented with possible gallstone pancreatitis/ biliary cholic   Mild troponin elevation - Likely demand ischemia from acute kidney injury versus his history of recent pericardiocentesis  - Trop level flat at 0.05, 0.04 and 0.05 - No chest pain at this time - The 12 lead EKG showed sinus rhythm - 2 D ECHO done today but results pending   Acute kidney injury  - Likely prerenal - Cr normalized with IV fluids  PAF (paroxysmal atrial  fibrillation) ([place]) - CHADS vasc score 3 - Last 2 D ECHO in 07/2016 showed preserved EF - Rate controlled with amiodarone and metoprolol - Continue anticoagulation with apixaban   Anemia of chronic disease - Due to chronic anticoagulation - Hgb stable at 10.7    Essential hypertension - Metoprolol held 2/4 secondary to hypotension. Patient placed on IV fluids.   Dyslipidemia - Continue atorvastatin   Adult failure to thrive / Protein-calorie malnutrition, severe - In the context of acute illness - Seen by nutritionist  Depression - Continue Zoloft   Deep tissue injury to sacrum , stage 2 - Appreciate WOC assessment - Deep tissue injury to sacrum; 3X3cm area which is darker-colored than surrounding skin with slight reddish tint. - Right inner buttock with patchy area of full thickness skin loss; .2X.2X.1cm and .2X.2X.1cm; both are pale white in color without odor or drainage.  There is pink dry scar tissue to left buttocks where other previous wounds have healed.   - Dressing procedure/placement/frequency: Foam dressing to protect and promote healing.   Influenza  - On Tamiflu prior to admission so resumed to complete the course  Thrombocytopenia - Likely in the setting of anticoagulation - No reports of bleed - Platelets 99 this am   DVT prophylaxis: apixaban Code Status: Full Family Communication: No family at the bedside this am Disposition Plan: Home once he feels better, once LFT's improved, also awaiting 2 d ECHO, possible D/C by 2/7 or 2/8   Consultants:   Gastroenterology: Dr. Michail Sermon 09/19/2016  Palliative care Wadie Lessen, NP 09/19/2016  WOC  Procedures:   MRCP 09/19/2016  Abdominal ultrasound 09/17/2016  Abdominal x-ray 09/18/2016  Antimicrobials:   IV Rocephin 09/19/2016 -->  IV Flagyl 09/19/2016 -->    Subjective: No overnight events.   Objective: Vitals:   09/19/16 0536 09/19/16 1505 09/19/16 2018 09/20/16 1236  BP: (!) 93/55 121/70 127/62 119/64  Pulse: 65 70 81 67  Resp: 16 14 16 17   Temp: 97.9 F (36.6 C) 97.8 F (36.6 C) 98.8 F (37.1 C) 98 F (36.7 C)  TempSrc: Oral Oral Oral Oral  SpO2: 100% 100% 100% 98%  Weight:        Intake/Output Summary (Last 24 hours) at  09/20/16 1434 Last data filed at 09/20/16 1051  Gross per 24 hour  Intake           912.67 ml  Output              700 ml  Net           212.67 ml   Filed Weights   09/18/16 0049  Weight: 59.2 kg (130 lb 8.2 oz)    Examination:  General exam: Appears calm and comfortable  Respiratory system: Clear to auscultation. Respiratory effort normal. Cardiovascular system: S1 & S2 heard, Rate controlled  Gastrointestinal system: Abdomen is nondistended, soft and nontender. No organomegaly or masses felt. Normal bowel sounds heard. Central nervous system: Alert and oriented. No focal neurological deficits. Extremities: Symmetric 5 x 5 power. Skin: sacral ulcer stage 2 Psychiatry: Judgement and insight appear normal. Mood & affect appropriate.   Data Reviewed: I have personally reviewed following labs and imaging studies  CBC:  Recent Labs Lab 09/17/16 1827 09/18/16 0835 09/19/16 1607 09/20/16 0811  WBC 7.0 16.1* 7.6 7.3  NEUTROABS 4.7  --  5.8 5.2  HGB 14.2 12.7* 12.3* 10.7*  HCT 43.1 38.4* 37.6* 33.2*  MCV 84.3 84.0 84.5 84.5  PLT 202 108* 85* 99*  Basic Metabolic Panel:  Recent Labs Lab 09/17/16 1827 09/18/16 0835 09/19/16 0310 09/20/16 0811  NA 138 139 138 140  K 4.4 4.5 4.3 4.2  CL 104 107 107 107  CO2 23 25 23 25   GLUCOSE 134* 104* 104* 92  BUN 16 14 14 10   CREATININE 1.29* 1.18 0.89 0.88  CALCIUM 9.4 8.8* 8.8* 8.4*   GFR: Estimated Creatinine Clearance: 52.3 mL/min (by C-G formula based on SCr of 0.88 mg/dL). Liver Function Tests:  Recent Labs Lab 09/17/16 1827 09/18/16 0835 09/19/16 0310 09/20/16 0811  AST 46* 392* 182* 62*  ALT 35 269* 187* 101*  ALKPHOS 87 163* 152* 126  BILITOT 1.8* 2.2* 2.7* 1.6*  PROT 6.9 5.6* 5.5* 5.1*  ALBUMIN 3.5 2.6* 2.5* 2.2*    Recent Labs Lab 09/17/16 1827 09/19/16 0310 09/20/16 0811  LIPASE 101* 58* 58*   No results for input(s): AMMONIA in the last 168 hours. Coagulation Profile:  Recent Labs Lab  09/18/16 0835  INR 2.02   Cardiac Enzymes:  Recent Labs Lab 09/17/16 1827 09/18/16 0114 09/18/16 0835  TROPONINI 0.05* 0.04* 0.05*   BNP (last 3 results) No results for input(s): PROBNP in the last 8760 hours. HbA1C: No results for input(s): HGBA1C in the last 72 hours. CBG: No results for input(s): GLUCAP in the last 168 hours. Lipid Profile: No results for input(s): CHOL, HDL, LDLCALC, TRIG, CHOLHDL, LDLDIRECT in the last 72 hours. Thyroid Function Tests: No results for input(s): TSH, T4TOTAL, FREET4, T3FREE, THYROIDAB in the last 72 hours. Anemia Panel: No results for input(s): VITAMINB12, FOLATE, FERRITIN, TIBC, IRON, RETICCTPCT in the last 72 hours. Urine analysis:    Component Value Date/Time   COLORURINE AMBER (A) 09/18/2016 1104   APPEARANCEUR CLEAR 09/18/2016 1104   LABSPEC 1.021 09/18/2016 1104   PHURINE 5.0 09/18/2016 1104   GLUCOSEU NEGATIVE 09/18/2016 1104   HGBUR NEGATIVE 09/18/2016 1104   BILIRUBINUR NEGATIVE 09/18/2016 1104   KETONESUR 5 (A) 09/18/2016 1104   PROTEINUR NEGATIVE 09/18/2016 1104   UROBILINOGEN 4.0 (H) 01/30/2014 1402   NITRITE NEGATIVE 09/18/2016 1104   LEUKOCYTESUR NEGATIVE 09/18/2016 1104   Sepsis Labs: @LABRCNTIP (procalcitonin:4,lacticidven:4)    Recent Results (from the past 240 hour(s))  Surgical pcr screen     Status: Abnormal   Collection Time: 09/18/16  1:02 AM  Result Value Ref Range Status   MRSA, PCR NEGATIVE NEGATIVE Final   Staphylococcus aureus POSITIVE (A) NEGATIVE Final    Comment:        The Xpert SA Assay (FDA approved for NASAL specimens in patients over 5 years of age), is one component of a comprehensive surveillance program.  Test performance has been validated by [facility] for patients greater than or equal to 5 year old. It is not intended to diagnose infection nor to guide or monitor treatment.   Culture, blood (routine x 2)     Status: Abnormal (Preliminary result)   Collection Time:  09/18/16  8:25 PM  Result Value Ref Range Status   Specimen Description BLOOD LEFT ARM  Final   Special Requests BOTTLES DRAWN AEROBIC ONLY  8CC  Final   Culture  Setup Time   Final    GRAM NEGATIVE RODS AEROBIC BOTTLE ONLY CRITICAL RESULT CALLED TO, READ BACK BY AND VERIFIED WITH: E. MARTIN, PHARM, 09/19/16 AT 47 BY J FUDESCO    Culture KLEBSIELLA OXYTOCA (A)  Final   Report Status PENDING  Incomplete  Blood Culture ID Panel (Reflexed)     Status: Abnormal  Collection Time: 09/18/16  8:25 PM  Result Value Ref Range Status   Enterococcus species NOT DETECTED NOT DETECTED Final   Listeria monocytogenes NOT DETECTED NOT DETECTED Final   Staphylococcus species NOT DETECTED NOT DETECTED Final   Staphylococcus aureus NOT DETECTED NOT DETECTED Final   Streptococcus species NOT DETECTED NOT DETECTED Final   Streptococcus agalactiae NOT DETECTED NOT DETECTED Final   Streptococcus pneumoniae NOT DETECTED NOT DETECTED Final   Streptococcus pyogenes NOT DETECTED NOT DETECTED Final   Acinetobacter baumannii NOT DETECTED NOT DETECTED Final   Enterobacteriaceae species DETECTED (A) NOT DETECTED Final    Comment: Enterobacteriaceae represent a large family of gram-negative bacteria, not a single organism. CRITICAL RESULT CALLED TO, READ BACK BY AND VERIFIED WITH: E. MARTIN, 09/19/16 AT 1428 BY J FUDESCO    Enterobacter cloacae complex NOT DETECTED NOT DETECTED Final   Escherichia coli NOT DETECTED NOT DETECTED Final   Klebsiella oxytoca DETECTED (A) NOT DETECTED Final    Comment: CRITICAL RESULT CALLED TO, READ BACK BY AND VERIFIED WITH: E. MARTIN, 09/19/16 AT 1428 BY J FUDESCO    Klebsiella pneumoniae NOT DETECTED NOT DETECTED Final   Proteus species NOT DETECTED NOT DETECTED Final   Serratia marcescens NOT DETECTED NOT DETECTED Final   Carbapenem resistance NOT DETECTED NOT DETECTED Final   Haemophilus influenzae NOT DETECTED NOT DETECTED Final   Neisseria meningitidis NOT DETECTED NOT  DETECTED Final   Pseudomonas aeruginosa NOT DETECTED NOT DETECTED Final   Candida albicans NOT DETECTED NOT DETECTED Final   Candida glabrata NOT DETECTED NOT DETECTED Final   Candida krusei NOT DETECTED NOT DETECTED Final   Candida parapsilosis NOT DETECTED NOT DETECTED Final   Candida tropicalis NOT DETECTED NOT DETECTED Final  Culture, blood (routine x 2)     Status: Abnormal (Preliminary result)   Collection Time: 09/18/16  8:34 PM  Result Value Ref Range Status   Specimen Description BLOOD RIGHT HAND  Final   Special Requests IN PEDIATRIC BOTTLE  3CC  Final   Culture  Setup Time   Final    GRAM NEGATIVE RODS IN PEDIATRIC BOTTLE CRITICAL RESULT CALLED TO, READ BACK BY AND VERIFIED WITH: E. MARTIN, PHARM, 09/19/16 AT 88 BY J FUDESCO    Culture KLEBSIELLA OXYTOCA SUSCEPTIBILITIES TO FOLLOW  (A)  Final   Report Status PENDING  Incomplete  Blood Culture ID Panel (Reflexed)     Status: Abnormal   Collection Time: 09/18/16  8:34 PM  Result Value Ref Range Status   Enterococcus species NOT DETECTED NOT DETECTED Final   Listeria monocytogenes NOT DETECTED NOT DETECTED Final   Staphylococcus species NOT DETECTED NOT DETECTED Final   Staphylococcus aureus NOT DETECTED NOT DETECTED Final   Streptococcus species NOT DETECTED NOT DETECTED Final   Streptococcus agalactiae NOT DETECTED NOT DETECTED Final   Streptococcus pneumoniae NOT DETECTED NOT DETECTED Final   Streptococcus pyogenes NOT DETECTED NOT DETECTED Final   Acinetobacter baumannii NOT DETECTED NOT DETECTED Final   Enterobacteriaceae species DETECTED (A) NOT DETECTED Final    Comment: Enterobacteriaceae represent a large family of gram-negative bacteria, not a single organism. CRITICAL RESULT CALLED TO, READ BACK BY AND VERIFIED WITH: E. MARTIN, PHARM, 09/19/16 AT 1428 BY J FUDESCO    Enterobacter cloacae complex NOT DETECTED NOT DETECTED Final   Escherichia coli NOT DETECTED NOT DETECTED Final   Klebsiella oxytoca DETECTED  (A) NOT DETECTED Final    Comment: CRITICAL RESULT CALLED TO, READ BACK BY AND VERIFIED WITH: E. MARTIN ,  PHARM, 09/19/16 AT 1428 BY J FUDESCO    Klebsiella pneumoniae NOT DETECTED NOT DETECTED Final   Proteus species NOT DETECTED NOT DETECTED Final   Serratia marcescens NOT DETECTED NOT DETECTED Final   Carbapenem resistance NOT DETECTED NOT DETECTED Final   Haemophilus influenzae NOT DETECTED NOT DETECTED Final   Neisseria meningitidis NOT DETECTED NOT DETECTED Final   Pseudomonas aeruginosa NOT DETECTED NOT DETECTED Final   Candida albicans NOT DETECTED NOT DETECTED Final   Candida glabrata NOT DETECTED NOT DETECTED Final   Candida krusei NOT DETECTED NOT DETECTED Final   Candida parapsilosis NOT DETECTED NOT DETECTED Final   Candida tropicalis NOT DETECTED NOT DETECTED Final      Radiology Studies: Mr 3d Recon At Scanner Result Date: 09/19/2016 1. Cholelithiasis. Moderate gallbladder sludge. Diffuse gallbladder wall thickening. Gallbladder distention. No pericholecystic fluid. MRI findings are equivocal for acute cholecystitis and correlation with clinical exam, right upper quadrant abdominal sonogram and/ or hepatobiliary scintigraphy can be performed as clinically warranted. 2. No biliary ductal dilatation. Scattered sludge throughout the common bile duct without appreciable choledocholithiasis. No biliary mass or stricture. 3. No MRI findings of acute pancreatitis. No peripancreatic fluid collections. No pancreatic duct dilation. 4. Cardiomegaly. Small to moderate pericardial effusion/thickening, decreased since 08/05/2016 CT study. 5. Aortic atherosclerosis. Electronically Signed   By: Ilona Sorrel M.D.   On: 09/19/2016 15:07   US Abdomen Limited Result Date: 2/2/2018Cholelithiasis with gallbladder sludge. No other sonographic features to suggest acute cholecystitis. No biliary dilatation. Electronically Signed   By: Jeannine Boga M.D.   On: 09/17/2016 21:09   Dg Abd 2  Views Result Date: 09/18/2016 Normal bowel gas pattern, no free air. Cholelithiasis. Electronically Signed   By: Genevie Ann M.D.   On: 09/18/2016 15:24   Dg Abd Portable 1v Result Date: 09/18/2016 Normal bowel gas pattern.  Cholelithiasis. Electronically Signed   By: Genevie Ann M.D.   On: 09/18/2016 10:02   Mr Abdomen Mrcp Moise Boring Contast Result Date: 09/19/2016  1. Cholelithiasis. Moderate gallbladder sludge. Diffuse gallbladder wall thickening. Gallbladder distention. No pericholecystic fluid. MRI findings are equivocal for acute cholecystitis and correlation with clinical exam, right upper quadrant abdominal sonogram and/ or hepatobiliary scintigraphy can be performed as clinically warranted. 2. No biliary ductal dilatation. Scattered sludge throughout the common bile duct without appreciable choledocholithiasis. No biliary mass or stricture. 3. No MRI findings of acute pancreatitis. No peripancreatic fluid collections. No pancreatic duct dilation. 4. Cardiomegaly. Small to moderate pericardial effusion/thickening, decreased since 08/05/2016 CT study. 5. Aortic atherosclerosis. Electronically Signed   By: Ilona Sorrel M.D.   On: 09/19/2016 15:07     Scheduled Meds: . amiodarone  400 mg Oral Daily  . apixaban  2.5 mg Oral BID  . atorvastatin  40 mg Oral Daily  . brimonidine  1 drop Both Eyes QHS  . cefTRIAXone (ROCEPHIN)  IV  2 g Intravenous Q24H  . Chlorhexidine Gluconate Cloth  6 each Topical Daily  . feeding supplement (ENSURE ENLIVE)  237 mL Oral TID BM  . metoprolol  100 mg Oral BID  . metronidazole  500 mg Intravenous Q8H  . multivitamin with minerals  1 tablet Oral Daily  . mupirocin ointment  1 application Nasal BID  . oseltamivir  30 mg Oral BID  . pantoprazole  40 mg Oral BID  . senna  1 tablet Oral Daily  . sertraline  50 mg Oral Daily   Continuous Infusions: . sodium chloride 0.9 % 1,000 mL infusion  125 mL/hr at 09/19/16 1452     LOS: 1 day    Time spent: 25 minutes  Greater  than 50% of the time spent on counseling and coordinating the care.   Leisa Lenz, MD [facility] Pager [phone]  If 7PM-7AM, please contact night-coverage [email or web address] Password [facility] 09/20/2016, 2:34 PM

## 2016-09-20 NOTE — Progress Notes (Signed)
  Echocardiogram 2D Echocardiogram limited has been performed.  Aggie Cosier 09/20/2016, 9:43 AM

## 2016-09-21 ENCOUNTER — Inpatient Hospital Stay (HOSPITAL_COMMUNITY): Payer: Medicare Other

## 2016-09-21 DIAGNOSIS — Z66 Do not resuscitate: Secondary | ICD-10-CM

## 2016-09-21 DIAGNOSIS — K8050 Calculus of bile duct without cholangitis or cholecystitis without obstruction: Secondary | ICD-10-CM

## 2016-09-21 LAB — CULTURE, BLOOD (ROUTINE X 2)

## 2016-09-21 MED ORDER — TECHNETIUM TC 99M MEBROFENIN IV KIT
5.0000 | PACK | Freq: Once | INTRAVENOUS | Status: AC | PRN
  Administered 2016-09-21: 5 via INTRAVENOUS

## 2016-09-21 MED ORDER — MORPHINE SULFATE (PF) 4 MG/ML IV SOLN
2.4000 mg | Freq: Once | INTRAVENOUS | Status: AC
  Administered 2016-09-21: 2.4 mg via INTRAVENOUS

## 2016-09-21 MED ORDER — MORPHINE SULFATE (PF) 4 MG/ML IV SOLN
INTRAVENOUS | Status: AC
  Administered 2016-09-21: 2.4 mg via INTRAVENOUS
  Filled 2016-09-21: qty 1

## 2016-09-21 NOTE — Progress Notes (Addendum)
Patient ID: Gregory [name], male   DOB: 11/19/1929, 83 y.o.   MRN: [id number]  PROGRESS NOTE    DERYN ABRAMOWICZ  [id number] DOB: 05-01-1930 DOA: 09/17/2016  PCP: [name], MD   Brief Narrative:  83 y.o.malewith medical history significant forSTEMI in 2017, prostate Ca, paroxysmal atrial fibrillation on apixaban, hypertension, gastric ulcers and GI bleed, CAD, CVA in 2015 (left cerebral hemisphere CVA on MRI).  Of note, previous hospitalization from 08/05/2016 --> 08/22/2016 for incarcerated hernia and underwent surgery 08/05/2016 and then developed large pericardial effusion with evidence of tamponade and he underwent pericardiocentesis 08/06/2016. Then he developed afib and was started on BB and amiodarone. Drain was finally removed 12/24 and 2 D ECHO later 12/29 showed moderate effusion without tamponade. Please note that during that hospitalization palliative care was also consulted for goals of care and was supposed to continue to follow while pt in SNF.  Pt presented to [facility] with chest and abdominal pain, initially felt as burning type of pain and it was 7-8/10 in intensity, intermittent and radiating to suprapubic area. He did not tolerate much PO intake and even sips of water made him nauseous. He reported 2 episodes of non bloody vomiting. Pt reported pain was better after morphine.  BP on admission was 101/55, HR 91, normal temp, oxygen saturation was 98-100% on room air. Blood work was notable for Cr of 1.29 , lipase was 101, AST was 46 and repeat number 392, ALT was WNL and then 269, ALP was normal and repeat lab 163 and Bilirubin was 1.8 -->2.2. Initial troponin was 0.05, lactic acid 2.32. FOBT was negative. He had a RUQ [place] which showed cholelithiasis and GB sludge, no cholecystitis.    Assessment & Plan:   Abdominal pain / Abnormal LFT's likely secondary to gallstone pancreatitis versus biliary colic / Symptomatic cholelithiasis and rule out cholecystitis - Patient  presented with abdominal pain epigastric in nature with some nausea and emesis prior to admission. Concern for possible passed gallstone. - LFT's initially WNL and then up AST 392, ALT 269, ALP 163 and Bili 2.2. Patient also noted to have a elevated lipase of 101. LFT's have improved since - Abdominal ultrasound done and showed cholelithiasis with gallbladder sludge. No other sonographic features to suggest acute cholecystitis. No biliary dilatation.  - No acute findings on abd x ray - MRCP obtained with cholelithiasis, moderate gallbladder sludge, diffuse gallbladder wall thickening, gallbladder distention, no pericholecystic fluid. MRI findings equivocal for acute cholecystitis and correlate with clinical exam, right upper quadrant abdominal sonogram and/or hepatobiliary scintigraphy. No biliary ductal dilatation.  - GI has seen the pt in consultation, requested surgical consultation - Plan for HIDA this am - No surgical intervention required at ths time per surgery   Bacteremia secondary to Klebsiella oxytoca / Leukocytosis / Lactic acidosis / Sepsis  - Blood cx on admission grew Klebsiella Oxytoca - Repeat blood cx 2/5 showed no growth  - Placed on rocephin and flagyl becasue pt presented with possible gallstone pancreatitis/ biliary cholic   Mild troponin elevation - Likely demand ischemia from acute kidney injury versus his history of recent pericardiocentesis  - Trop level flat at 0.05, 0.04 and 0.05 - No chest pain at this time - The 12 lead EKG showed sinus rhythm - 2 D ECHO 2/5 waiting for report this am  Acute kidney injury  - Likely prerenal - Cr normalized with IV fluids  PAF (paroxysmal atrial fibrillation) (HCC) - CHADS vasc score 3 - Last 2  D ECHO in 07/2016 showed preserved EF - Rate controlled with amiodarone and metoprolol - Continue anticoagulation with apixaban   Anemia of chronic disease - Due to chronic anticoagulation - Hgb 10.7 on 2/5 - Will check CBC  tomorrow am  Essential hypertension - Metoprolol held 2/4 secondary to hypotension.  - BP 121/58 this am may resume metoprolol but would reduce dose to 50 mg BID instead of 100 mg BID  Dyslipidemia - Continue atorvastatin 40 mg at bedtime   Adult failure to thrive / Protein-calorie malnutrition, severe - In the context of acute illness - Seen by nutritionist  Depression - Continue Zoloft   Deep tissue injury to sacrum , stage 2 - Appreciate WOC assessment - Deep tissue injury to sacrum; 3X3cm area which is darker-colored than surrounding skin with slight reddish tint. - Right inner buttock with patchy area of full thickness skin loss; .2X.2X.1cm and .2X.2X.1cm; both are pale white in color without odor or drainage.  There is pink dry scar tissue to left buttocks where other previous wounds have healed.   - Dressing procedure/placement/frequency: Foam dressing to protect and promote healing.   Influenza  - On Tamiflu prior to admission so resumed to complete the course  Thrombocytopenia - Likely in the setting of anticoagulation - No reports of bleed - Platelets stable, 85 --> 99   DVT prophylaxis: apixaban Code Status: DNR/DNI Family Communication: No family at the bedside this am Disposition Plan: home once cleared by GI   Consultants:   Gastroenterology: Dr. Michail Sermon 09/19/2016  Surgery   Palliative care Wadie Lessen, NP 09/19/2016  WOC  Procedures:   MRCP 09/19/2016  Abdominal ultrasound 09/17/2016  Abdominal x-ray 09/18/2016  Antimicrobials:   IV Rocephin 09/19/2016 -->  IV Flagyl 09/19/2016 -->    Subjective: No overnight events.   Objective: Vitals:   09/20/16 2130 09/21/16 0413 09/21/16 1126 09/21/16 1325  BP: 122/60 101/65 134/86 (!) 121/58  Pulse: 70 62  71  Resp: 17 17 16 18   Temp: 98 F (36.7 C) 98.2 F (36.8 C)  98.7 F (37.1 C)  TempSrc: Oral Oral  Oral  SpO2: 98% 98% 96% 99%  Weight:        Intake/Output Summary  (Last 24 hours) at 09/21/16 1623 Last data filed at 09/21/16 0900  Gross per 24 hour  Intake                0 ml  Output                0 ml  Net                0 ml   Filed Weights   09/18/16 0049  Weight: 59.2 kg (130 lb 8.2 oz)    Examination:  General exam: Appears calm and comfortable, no distress  Respiratory system: CNo wheezing, no rhonchi  Cardiovascular system: S1 & S2 heard, Rate controlled  Gastrointestinal system: (+) BS, non tender abdomen  Central nervous system: No focal neurological deficits. Extremities: No edema, no tenderness Skin: Skin is warm and dry  Psychiatry: Mood & affect normal   Data Reviewed: I have personally reviewed following labs and imaging studies  CBC:  Recent Labs Lab 09/17/16 1827 09/18/16 0835 09/19/16 1607 09/20/16 0811  WBC 7.0 16.1* 7.6 7.3  NEUTROABS 4.7  --  5.8 5.2  HGB 14.2 12.7* 12.3* 10.7*  HCT 43.1 38.4* 37.6* 33.2*  MCV 84.3 84.0 84.5 84.5  PLT 202 108* 85* 99*  Basic Metabolic Panel:  Recent Labs Lab 09/17/16 1827 09/18/16 0835 09/19/16 0310 09/20/16 0811  NA 138 139 138 140  K 4.4 4.5 4.3 4.2  CL 104 107 107 107  CO2 23 25 23 25   GLUCOSE 134* 104* 104* 92  BUN 16 14 14 10   CREATININE 1.29* 1.18 0.89 0.88  CALCIUM 9.4 8.8* 8.8* 8.4*   GFR: Estimated Creatinine Clearance: 52.3 mL/min (by C-G formula based on SCr of 0.88 mg/dL). Liver Function Tests:  Recent Labs Lab 09/17/16 1827 09/18/16 0835 09/19/16 0310 09/20/16 0811  AST 46* 392* 182* 62*  ALT 35 269* 187* 101*  ALKPHOS 87 163* 152* 126  BILITOT 1.8* 2.2* 2.7* 1.6*  PROT 6.9 5.6* 5.5* 5.1*  ALBUMIN 3.5 2.6* 2.5* 2.2*    Recent Labs Lab 09/17/16 1827 09/19/16 0310 09/20/16 0811  LIPASE 101* 58* 58*   No results for input(s): AMMONIA in the last 168 hours. Coagulation Profile:  Recent Labs Lab 09/18/16 0835  INR 2.02   Cardiac Enzymes:  Recent Labs Lab 09/17/16 1827 09/18/16 0114 09/18/16 0835  TROPONINI 0.05* 0.04*  0.05*   BNP (last 3 results) No results for input(s): PROBNP in the last 8760 hours. HbA1C: No results for input(s): HGBA1C in the last 72 hours. CBG: No results for input(s): GLUCAP in the last 168 hours. Lipid Profile: No results for input(s): CHOL, HDL, LDLCALC, TRIG, CHOLHDL, LDLDIRECT in the last 72 hours. Thyroid Function Tests: No results for input(s): TSH, T4TOTAL, FREET4, T3FREE, THYROIDAB in the last 72 hours. Anemia Panel: No results for input(s): VITAMINB12, FOLATE, FERRITIN, TIBC, IRON, RETICCTPCT in the last 72 hours. Urine analysis:    Component Value Date/Time   COLORURINE AMBER (A) 09/18/2016 1104   APPEARANCEUR CLEAR 09/18/2016 1104   LABSPEC 1.021 09/18/2016 1104   PHURINE 5.0 09/18/2016 1104   GLUCOSEU NEGATIVE 09/18/2016 1104   HGBUR NEGATIVE 09/18/2016 1104   BILIRUBINUR NEGATIVE 09/18/2016 1104   KETONESUR 5 (A) 09/18/2016 1104   PROTEINUR NEGATIVE 09/18/2016 1104   UROBILINOGEN 4.0 (H) 01/30/2014 1402   NITRITE NEGATIVE 09/18/2016 1104   LEUKOCYTESUR NEGATIVE 09/18/2016 1104   Sepsis Labs: @LABRCNTIP (procalcitonin:4,lacticidven:4)    Recent Results (from the past 240 hour(s))  Surgical pcr screen     Status: Abnormal   Collection Time: 09/18/16  1:02 AM  Result Value Ref Range Status   MRSA, PCR NEGATIVE NEGATIVE Final   Staphylococcus aureus POSITIVE (A) NEGATIVE Final    Comment:        The Xpert SA Assay (FDA approved for NASAL specimens in patients over 15 years of age), is one component of a comprehensive surveillance program.  Test performance has been validated by [facility] for patients greater than or equal to 14 year old. It is not intended to diagnose infection nor to guide or monitor treatment.   Culture, blood (routine x 2)     Status: Abnormal   Collection Time: 09/18/16  8:25 PM  Result Value Ref Range Status   Specimen Description BLOOD LEFT ARM  Final   Special Requests BOTTLES DRAWN AEROBIC ONLY  8CC  Final    Culture  Setup Time   Final    GRAM NEGATIVE RODS AEROBIC BOTTLE ONLY CRITICAL RESULT CALLED TO, READ BACK BY AND VERIFIED WITH: E. MARTIN, PHARM, 09/19/16 AT 1428 BY J FUDESCO    Culture (A)  Final    KLEBSIELLA OXYTOCA SUSCEPTIBILITIES PERFORMED ON PREVIOUS CULTURE WITHIN THE LAST 5 DAYS.    Report Status 09/21/2016 FINAL  Final  Blood Culture ID Panel (Reflexed)     Status: Abnormal   Collection Time: 09/18/16  8:25 PM  Result Value Ref Range Status   Enterococcus species NOT DETECTED NOT DETECTED Final   Listeria monocytogenes NOT DETECTED NOT DETECTED Final   Staphylococcus species NOT DETECTED NOT DETECTED Final   Staphylococcus aureus NOT DETECTED NOT DETECTED Final   Streptococcus species NOT DETECTED NOT DETECTED Final   Streptococcus agalactiae NOT DETECTED NOT DETECTED Final   Streptococcus pneumoniae NOT DETECTED NOT DETECTED Final   Streptococcus pyogenes NOT DETECTED NOT DETECTED Final   Acinetobacter baumannii NOT DETECTED NOT DETECTED Final   Enterobacteriaceae species DETECTED (A) NOT DETECTED Final    Comment: Enterobacteriaceae represent a large family of gram-negative bacteria, not a single organism. CRITICAL RESULT CALLED TO, READ BACK BY AND VERIFIED WITH: E. MARTIN, 09/19/16 AT 1428 BY J FUDESCO    Enterobacter cloacae complex NOT DETECTED NOT DETECTED Final   Escherichia coli NOT DETECTED NOT DETECTED Final   Klebsiella oxytoca DETECTED (A) NOT DETECTED Final    Comment: CRITICAL RESULT CALLED TO, READ BACK BY AND VERIFIED WITH: E. MARTIN, 09/19/16 AT 1428 BY J FUDESCO    Klebsiella pneumoniae NOT DETECTED NOT DETECTED Final   Proteus species NOT DETECTED NOT DETECTED Final   Serratia marcescens NOT DETECTED NOT DETECTED Final   Carbapenem resistance NOT DETECTED NOT DETECTED Final   Haemophilus influenzae NOT DETECTED NOT DETECTED Final   Neisseria meningitidis NOT DETECTED NOT DETECTED Final   Pseudomonas aeruginosa NOT DETECTED NOT DETECTED Final    Candida albicans NOT DETECTED NOT DETECTED Final   Candida glabrata NOT DETECTED NOT DETECTED Final   Candida krusei NOT DETECTED NOT DETECTED Final   Candida parapsilosis NOT DETECTED NOT DETECTED Final   Candida tropicalis NOT DETECTED NOT DETECTED Final  Culture, blood (routine x 2)     Status: Abnormal   Collection Time: 09/18/16  8:34 PM  Result Value Ref Range Status   Specimen Description BLOOD RIGHT HAND  Final   Special Requests IN PEDIATRIC BOTTLE  3CC  Final   Culture  Setup Time   Final    GRAM NEGATIVE RODS IN PEDIATRIC BOTTLE CRITICAL RESULT CALLED TO, READ BACK BY AND VERIFIED WITH: EHassell Done, PHARM, 09/19/16 AT 1428 BY J FUDESCO    Culture KLEBSIELLA OXYTOCA (A)  Final   Report Status 09/21/2016 FINAL  Final   Organism ID, Bacteria KLEBSIELLA OXYTOCA  Final      Susceptibility   Klebsiella oxytoca - MIC*    AMPICILLIN RESISTANT Resistant     CEFAZOLIN <=4 SENSITIVE Sensitive     CEFEPIME <=1 SENSITIVE Sensitive     CEFTAZIDIME <=1 SENSITIVE Sensitive     CEFTRIAXONE <=1 SENSITIVE Sensitive     CIPROFLOXACIN <=0.25 SENSITIVE Sensitive     GENTAMICIN <=1 SENSITIVE Sensitive     IMIPENEM <=0.25 SENSITIVE Sensitive     TRIMETH/SULFA <=20 SENSITIVE Sensitive     AMPICILLIN/SULBACTAM 4 SENSITIVE Sensitive     PIP/TAZO <=4 SENSITIVE Sensitive     Extended ESBL NEGATIVE Sensitive     * KLEBSIELLA OXYTOCA  Blood Culture ID Panel (Reflexed)     Status: Abnormal   Collection Time: 09/18/16  8:34 PM  Result Value Ref Range Status   Enterococcus species NOT DETECTED NOT DETECTED Final   Listeria monocytogenes NOT DETECTED NOT DETECTED Final   Staphylococcus species NOT DETECTED NOT DETECTED Final   Staphylococcus aureus NOT DETECTED NOT DETECTED Final   Streptococcus species  NOT DETECTED NOT DETECTED Final   Streptococcus agalactiae NOT DETECTED NOT DETECTED Final   Streptococcus pneumoniae NOT DETECTED NOT DETECTED Final   Streptococcus pyogenes NOT DETECTED NOT DETECTED  Final   Acinetobacter baumannii NOT DETECTED NOT DETECTED Final   Enterobacteriaceae species DETECTED (A) NOT DETECTED Final    Comment: Enterobacteriaceae represent a large family of gram-negative bacteria, not a single organism. CRITICAL RESULT CALLED TO, READ BACK BY AND VERIFIED WITH: E. MARTIN, PHARM, 09/19/16 AT 1428 BY J FUDESCO    Enterobacter cloacae complex NOT DETECTED NOT DETECTED Final   Escherichia coli NOT DETECTED NOT DETECTED Final   Klebsiella oxytoca DETECTED (A) NOT DETECTED Final    Comment: CRITICAL RESULT CALLED TO, READ BACK BY AND VERIFIED WITH: E. MARTIN , PHARM, 09/19/16 AT 1428 BY J FUDESCO    Klebsiella pneumoniae NOT DETECTED NOT DETECTED Final   Proteus species NOT DETECTED NOT DETECTED Final   Serratia marcescens NOT DETECTED NOT DETECTED Final   Carbapenem resistance NOT DETECTED NOT DETECTED Final   Haemophilus influenzae NOT DETECTED NOT DETECTED Final   Neisseria meningitidis NOT DETECTED NOT DETECTED Final   Pseudomonas aeruginosa NOT DETECTED NOT DETECTED Final   Candida albicans NOT DETECTED NOT DETECTED Final   Candida glabrata NOT DETECTED NOT DETECTED Final   Candida krusei NOT DETECTED NOT DETECTED Final   Candida parapsilosis NOT DETECTED NOT DETECTED Final   Candida tropicalis NOT DETECTED NOT DETECTED Final  Culture, blood (routine x 2)     Status: None (Preliminary result)   Collection Time: 09/20/16  3:19 PM  Result Value Ref Range Status   Specimen Description BLOOD LEFT ANTECUBITAL  Final   Special Requests BOTTLES DRAWN AEROBIC ONLY  5 CC  Final   Culture NO GROWTH < 24 HOURS  Final   Report Status PENDING  Incomplete  Culture, blood (Routine X 2) w Reflex to ID Panel     Status: None (Preliminary result)   Collection Time: 09/20/16  4:21 PM  Result Value Ref Range Status   Specimen Description BLOOD RIGHT HAND  Final   Special Requests IN PEDIATRIC BOTTLE 3CC  Final   Culture NO GROWTH < 24 HOURS  Final   Report Status PENDING   Incomplete      Radiology Studies: Mr 3d Recon At Scanner Result Date: 09/19/2016 1. Cholelithiasis. Moderate gallbladder sludge. Diffuse gallbladder wall thickening. Gallbladder distention. No pericholecystic fluid. MRI findings are equivocal for acute cholecystitis and correlation with clinical exam, right upper quadrant abdominal sonogram and/ or hepatobiliary scintigraphy can be performed as clinically warranted. 2. No biliary ductal dilatation. Scattered sludge throughout the common bile duct without appreciable choledocholithiasis. No biliary mass or stricture. 3. No MRI findings of acute pancreatitis. No peripancreatic fluid collections. No pancreatic duct dilation. 4. Cardiomegaly. Small to moderate pericardial effusion/thickening, decreased since 08/05/2016 CT study. 5. Aortic atherosclerosis. Electronically Signed   By: Ilona Sorrel M.D.   On: 09/19/2016 15:07   US Abdomen Limited Result Date: 2/2/2018Cholelithiasis with gallbladder sludge. No other sonographic features to suggest acute cholecystitis. No biliary dilatation. Electronically Signed   By: Jeannine Boga M.D.   On: 09/17/2016 21:09   Dg Abd 2 Views Result Date: 09/18/2016 Normal bowel gas pattern, no free air. Cholelithiasis. Electronically Signed   By: Genevie Ann M.D.   On: 09/18/2016 15:24   Dg Abd Portable 1v Result Date: 09/18/2016 Normal bowel gas pattern.  Cholelithiasis. Electronically Signed   By: Genevie Ann M.D.   On: 09/18/2016  10:02   Mr Abdomen Mrcp Moise Boring Contast Result Date: 09/19/2016  1. Cholelithiasis. Moderate gallbladder sludge. Diffuse gallbladder wall thickening. Gallbladder distention. No pericholecystic fluid. MRI findings are equivocal for acute cholecystitis and correlation with clinical exam, right upper quadrant abdominal sonogram and/ or hepatobiliary scintigraphy can be performed as clinically warranted. 2. No biliary ductal dilatation. Scattered sludge throughout the common bile duct without appreciable  choledocholithiasis. No biliary mass or stricture. 3. No MRI findings of acute pancreatitis. No peripancreatic fluid collections. No pancreatic duct dilation. 4. Cardiomegaly. Small to moderate pericardial effusion/thickening, decreased since 08/05/2016 CT study. 5. Aortic atherosclerosis. Electronically Signed   By: Ilona Sorrel M.D.   On: 09/19/2016 15:07     Scheduled Meds: . amiodarone  400 mg Oral Daily  . apixaban  2.5 mg Oral BID  . atorvastatin  40 mg Oral Daily  . brimonidine  1 drop Both Eyes QHS  . cefTRIAXone (ROCEPHIN)  IV  2 g Intravenous Q24H  . Chlorhexidine Gluconate Cloth  6 each Topical Daily  . feeding supplement (ENSURE ENLIVE)  237 mL Oral TID BM  . metoprolol  100 mg Oral BID  . metronidazole  500 mg Intravenous Q8H  . multivitamin with minerals  1 tablet Oral Daily  . mupirocin ointment  1 application Nasal BID  . oseltamivir  30 mg Oral BID  . pantoprazole  40 mg Oral BID  . senna  1 tablet Oral Daily  . sertraline  50 mg Oral Daily   Continuous Infusions: . sodium chloride 0.9 % 1,000 mL infusion 125 mL/hr at 09/20/16 1558     LOS: 2 days    Time spent: 25 minutes  Greater than 50% of the time spent on counseling and coordinating the care.   Leisa Lenz, MD [facility] Pager [phone]  If 7PM-7AM, please contact night-coverage [email or web address] Password [facility] 09/21/2016, 4:23 PM

## 2016-09-21 NOTE — Progress Notes (Signed)
Physical Therapy Treatment Patient Details Name: Gregory [name] MRN: [id number] DOB: 22-Jan-1930 Today's Date: 09/21/2016    History of Present Illness 83 y.o.malewith chest and abdominal pain likely secondary to gallstone pancreatitis versus biliary colic, influenza, and bacteremia. PMH: STEMI in 2017, prostate Ca, paroxysmal atrial fibrillation, hypertension, gastric ulcers and GI bleed, CAD, CVA in 2015.    PT Comments    Patient required min/mod A for mobility this session and demonstrated decreased activity tolerance and balance deficits with ambulation. Given current mobility level pt will need 24 hour assistance/supervision for mobility. Pt will need ST-SNF if mobility does not improve and if 24 hour assist is not an option upon d/c.    Follow Up Recommendations  Home health PT;Supervision for mobility/OOB     Equipment Recommendations  Rolling walker with 5" wheels    Recommendations for Other Services       Precautions / Restrictions Precautions Precautions: Fall Restrictions Weight Bearing Restrictions: No    Mobility  Bed Mobility Overal bed mobility: Needs Assistance Bed Mobility: Rolling;Sidelying to Sit Rolling: Min assist Sidelying to sit: Min assist       General bed mobility comments: assist needed to roll to R side and elevated trunk into sitting; pt with difficulty gripping bed rail with L hand due to previous CVA and may do better rolling toward L side; use of bed rail and HOB elevated slightly; c/o dizziness upon sitting EOB that subsided quickly  Transfers Overall transfer level: Needs assistance Equipment used: Rolling walker (2 wheeled) Transfers: Sit to/from Stand Sit to Stand: Mod assist         General transfer comment: assist to power up into standing with cues for safe hand placement   Ambulation/Gait Ambulation/Gait assistance: Min assist Ambulation Distance (Feet): 22 Feet Assistive device: Rolling walker (2 wheeled) Gait  Pattern/deviations: Step-through pattern;Decreased stride length;Decreased step length - left;Drifts right/left Gait velocity: decreased   General Gait Details: pt drifted toward L side and required assist to steady especially with turning; ambulated in room    Stairs            Wheelchair Mobility    Modified Rankin (Stroke Patients Only)       Balance Overall balance assessment: Needs assistance Sitting-balance support: No upper extremity supported Sitting balance-Leahy Scale: Fair     Standing balance support: Bilateral upper extremity supported Standing balance-Leahy Scale: Poor Standing balance comment: using rw for support                    Cognition Arousal/Alertness: Awake/alert Behavior During Therapy: WFL for tasks assessed/performed Overall Cognitive Status: Within Functional Limits for tasks assessed                      Exercises      General Comments        Pertinent Vitals/Pain Pain Assessment: Faces Faces Pain Scale: Hurts a little bit Pain Location: abdomen Pain Descriptors / Indicators: Discomfort Pain Intervention(s): Monitored during session    Home Living                      Prior Function            PT Goals (current goals can now be found in the care plan section) Acute Rehab PT Goals Patient Stated Goal: get better PT Goal Formulation: With patient Time For Goal Achievement: 10/04/16 Potential to Achieve Goals: Good Progress towards PT goals: Progressing toward goals  Frequency    Min 3X/week      PT Plan Current plan remains appropriate    Co-evaluation             End of Session Equipment Utilized During Treatment: Gait belt Activity Tolerance: Patient tolerated treatment well Patient left: in chair;with call bell/phone within reach;with nursing/sitter in room     Time: 1556-1620 PT Time Calculation (min) (ACUTE ONLY): 24 min  Charges:  $Gait Training: 8-22  mins $Therapeutic Activity: 8-22 mins                    G Codes:      Salina April, PTA Pager: [phone]   09/21/2016, 4:33 PM

## 2016-09-21 NOTE — Progress Notes (Signed)
[facility] Progress Note     Subjective: Pt not having abdominal pain, nausea or vomiting. No new complaints.   Objective: Vital signs in last 24 hours: Temp:  [98 F (36.7 C)-98.7 F (37.1 C)] 98.7 F (37.1 C) (02/06 1325) Pulse Rate:  [62-71] 71 (02/06 1325) Resp:  [16-18] 18 (02/06 1325) BP: (101-134)/(58-86) 121/58 (02/06 1325) SpO2:  [96 %-99 %] 99 % (02/06 1325) Last BM Date: 09/16/16  Intake/Output from previous day: 02/05 0701 - 02/06 0700 In: 540 [P.O.:540] Out: 600 [Urine:600] Intake/Output this shift: No intake/output data recorded.  PE: General: pleasant, ill-appearing and cachectic AA male who is laying in bed in NAD HEENT: head is normocephalic, atraumatic.  Heart: regular, rate, and rhythm.  Lungs: rate normal and respiratory effort nonlabored Abd: soft, non-tender, non-distended, +BS, previous surgical scar noted. Skin: warm and dry, not diaphoretic  Lab Results:   Recent Labs  09/19/16 1607 09/20/16 0811  WBC 7.6 7.3  HGB 12.3* 10.7*  HCT 37.6* 33.2*  PLT 85* 99*   BMET  Recent Labs  09/19/16 0310 09/20/16 0811  NA 138 140  K 4.3 4.2  CL 107 107  CO2 23 25  GLUCOSE 104* 92  BUN 14 10  CREATININE 0.89 0.88  CALCIUM 8.8* 8.4*   PT/INR No results for input(s): LABPROT, INR in the last 72 hours. CMP     Component Value Date/Time   NA 140 09/20/2016 0811   NA 140 08/25/2016   K 4.2 09/20/2016 0811   CL 107 09/20/2016 0811   CO2 25 09/20/2016 0811   GLUCOSE 92 09/20/2016 0811   BUN 10 09/20/2016 0811   BUN 9 08/25/2016   CREATININE 0.88 09/20/2016 0811   CREATININE 0.67 (L) 102/01/2016 0825   CALCIUM 8.4 (L) 09/20/2016 0811   PROT 5.1 (L) 09/20/2016 0811   ALBUMIN 2.2 (L) 09/20/2016 0811   AST 62 (H) 09/20/2016 0811   ALT 101 (H) 09/20/2016 0811   ALKPHOS 126 09/20/2016 0811   BILITOT 1.6 (H) 09/20/2016 0811   GFRNONAA >60 09/20/2016 0811   GFRAA >60 09/20/2016 0811   Lipase     Component Value Date/Time   LIPASE 58 (H) 09/20/2016 0811       Studies/Results: Nm Hepato W/eject Fract  Result Date: 09/21/2016 CLINICAL DATA:  Upper abdominal pain and vomiting with known cholelithiasis EXAM: NUCLEAR MEDICINE HEPATOBILIARY IMAGING TECHNIQUE: Sequential images of the abdomen were obtained out to 60 minutes following intravenous administration of radiopharmaceutical. RADIOPHARMACEUTICALS:  8.4 mCi Tc-76m  Choletec IV COMPARISON:  None. FINDINGS: Prompt uptake and excretion of biliary tracer is noted. Biliary activity passes into small bowel, consistent with patent common bile duct. The gallbladder was not visualized at 90 minutes. Subsequent 2.4 mg of morphine was administered intravenously and all bladder visualization was noted. The gallbladder distends in a normal fashion. IMPRESSION: Normal uptake and excretion of biliary tracer. Gallbladder visualization is noted following morphine administration consistent with a patent cystic duct. Electronically Signed   By: Inez Catalina M.D.   On: 09/21/2016 12:58    Anti-infectives: Anti-infectives    Start     Dose/Rate Route Frequency Ordered Stop   09/19/16 1600  metroNIDAZOLE (FLAGYL) IVPB 500 mg     500 mg 100 mL/hr over 60 Minutes Intravenous Every 8 hours 09/19/16 1443     09/19/16 1500  cefTRIAXone (ROCEPHIN) 2 g in dextrose 5 % 50 mL IVPB     2 g 100 mL/hr over 30 Minutes Intravenous Every 24  hours 09/19/16 1443     09/19/16 1430  piperacillin-tazobactam (ZOSYN) IVPB 3.375 g  Status:  Discontinued     3.375 g 12.5 mL/hr over 240 Minutes Intravenous Every 8 hours 09/19/16 1412 09/19/16 1443   09/18/16 1200  oseltamivir (TAMIFLU) capsule 30 mg    Comments:  FOR 10 DAYS     30 mg Oral 2 times daily 09/18/16 1131 09/28/16 0959   09/18/16 1000  oseltamivir (TAMIFLU) capsule 30 mg  Status:  Discontinued    Comments:  FOR 10 DAYS     30 mg Oral Daily 09/18/16 0048 09/18/16 1131       Assessment/Plan  CAD PAF anticoagulated on Eliquis PMH  pericardial effusion  PMH MI 03/2016 PMH CVA 2015 HTN Dyslipidemia  Depression  Influenza - tamiflu prior to admission, continued during hospitalization  Bacteremia secondary to klebsiella oxytoca  Leukocytosis - resolved on IV abx  Symptomatic cholelithiasis r/o cholecystitis Abnormal LFT's  - RUQ U/S w/ cholelithiasis, sludge, no signs cholecystitis - MRCP suggestive of possible cholecystitis (gallbladder wall thickening and distention, no pericholecystic fluid); gallbladder sludge in CBD, without obvious stones. - Lipase 58 - HIDA today showed patent cystic duct - LFT's and Tbili trending down   FEN: DYS 1 ID: Rocephin/Flagyl  VTE: Eliquis (last dose this morning at 0825)  Plan: Continue empiric IV abx. Given patients age and comorbidities he would be a high risk surgical candidate. With patent cystic duct seen on HIDA there is no indication for perc drain or cholecystectomy at this time. Continue IV antibiotics. We will continue to follow.     LOS: 2 days    Kalman Drape , [facility] 09/21/2016, 3:22 PM Pager: [phone] Consults: [phone] Mon-Fri 7:00 am-4:30 pm Sat-Sun 7:00 am-11:30 am

## 2016-09-21 NOTE — Progress Notes (Signed)
PT Cancellation Note  Patient Details Name: Gregory [name] MRN: [id number] DOB: Mar 11, 1930   Cancelled Treatment:    Reason Eval/Treat Not Completed: Patient declined, no reason specified Pt declined mobility due to not having lunch yet and just getting back from scan. PT will continue to follow acutely.    Salina April, PTA Pager: [phone]   09/21/2016, 1:16 PM

## 2016-09-21 NOTE — Progress Notes (Signed)
PT Cancellation Note  Patient Details Name: Gregory [name] MRN: [id number] DOB: 20-Jul-1930   Cancelled Treatment:    Reason Eval/Treat Not Completed: Patient at procedure or test/unavailable PT will check on pt later as time allows.    Salina April, PTA Pager: [phone]   09/21/2016, 10:28 AM

## 2016-09-21 NOTE — Progress Notes (Signed)
Daily Progress Note   Patient Name: Gregory [name]       Date: 09/21/2016 DOB: Sep 18, 1929  Age: 83 y.o. MRN#: [id number] Attending Physician: Robbie Lis, MD Primary Care Physician: [name], MD Admit Date: 09/17/2016  Reason for Consultation/Follow-up: Establishing goals of care and Psychosocial/spiritual support  Subjective: -  continued discussion with the patient regarding current medical situation, GOCs disposition and options  - we discussed the reality that he has very limited social support available to him enabling him to stay in his home alone on discharge  -patient's son/Ron Melchior returned my call today and he expressed his frustrations with his father's choices regarding determination to attempt to stay at home in an unsafe situation. He tells me as a family they have encouraged the patient to secure HPOA and AD without success  - He verbalizes that to his knowledge there is no social support that will be available in the home on any consistent basis  Length of Stay: 2  Current Medications: Scheduled Meds:  . amiodarone  400 mg Oral Daily  . apixaban  2.5 mg Oral BID  . atorvastatin  40 mg Oral Daily  . brimonidine  1 drop Both Eyes QHS  . cefTRIAXone (ROCEPHIN)  IV  2 g Intravenous Q24H  . Chlorhexidine Gluconate Cloth  6 each Topical Daily  . feeding supplement (ENSURE ENLIVE)  237 mL Oral TID BM  . metoprolol  100 mg Oral BID  . metronidazole  500 mg Intravenous Q8H  . multivitamin with minerals  1 tablet Oral Daily  . mupirocin ointment  1 application Nasal BID  . oseltamivir  30 mg Oral BID  . pantoprazole  40 mg Oral BID  . senna  1 tablet Oral Daily  . sertraline  50 mg Oral Daily    Continuous Infusions: . sodium chloride 0.9 % 1,000 mL infusion  125 mL/hr at 09/20/16 1558    PRN Meds: ondansetron, oxyCODONE, polyethylene glycol  Physical Exam  Constitutional: He is oriented to person, place, and time. He appears cachectic. He appears ill.  Cardiovascular: Normal rate, regular rhythm and normal heart sounds.   Pulmonary/Chest: He has decreased breath sounds in the right lower field and the left lower field.  Musculoskeletal:  generalized weakness and atrophy  Neurological: He is alert and oriented to  person, place, and time.  Skin: Skin is warm and dry.            Vital Signs: BP 101/65 (BP Location: Right Arm)   Pulse 62   Temp 98.2 F (36.8 C) (Oral)   Resp 17   Wt 59.2 kg (130 lb 8.2 oz)   SpO2 98%   BMI 18.20 kg/m  SpO2: SpO2: 98 % O2 Device: O2 Device: Not Delivered O2 Flow Rate:    Intake/output summary:   Intake/Output Summary (Last 24 hours) at 09/21/16 1040 Last data filed at 09/21/16 0900  Gross per 24 hour  Intake              300 ml  Output              600 ml  Net             -300 ml   LBM: Last BM Date: 09/16/16 Baseline Weight: Weight: 59.2 kg (130 lb 8.2 oz) Most recent weight: Weight: 59.2 kg (130 lb 8.2 oz)       Palliative Assessment/Data:   30 %    Flowsheet Rows   Flowsheet Row Most Recent Value  Intake Tab  Referral Department  Cardiology  Unit at Time of Referral  Orthopedic Unit  Palliative Care Primary Diagnosis  Cardiac  Date Notified  09/18/16  Palliative Care Type  New Palliative care  Reason for referral  Clarify Goals of Care  Date of Admission  09/17/16  Date first seen by Palliative Care  09/19/16  # of days Palliative referral response time  1 Day(s)  # of days IP prior to Palliative referral  1  Clinical Assessment  Palliative Performance Scale Score  30%  Psychosocial & Spiritual Assessment  Palliative Care Outcomes      Patient Active Problem List   Diagnosis Date Noted  . DNR (do not resuscitate) discussion 09/19/2016  . Palliative care by specialist  09/19/2016  . Bacteremia due to Gram-negative bacteria 09/19/2016  . Cholelithiases 09/19/2016  . Gallstone pancreatitis: Probable 09/19/2016  . Biliary colic: Probable [id number]  . Abnormal LFTs   . Pressure injury of skin 09/18/2016  . Protein-calorie malnutrition, severe 09/18/2016  . Abdominal pain 09/17/2016  . Adult failure to thrive 08/24/2016  . Persistent atrial fibrillation ([place])   . Anticoagulated   . Difficulty in walking, not elsewhere classified   . Melanotic stools 08/14/2016  . Postoperative anemia due to acute blood loss 08/14/2016  . Pericardial effusion   . Hypokalemia   . Malnutrition of moderate degree 08/06/2016  . Hypotension   . Cardiac tamponade   . Chronic diastolic congestive heart failure ([place]) 08/05/2016  . Incarcerated inguinal hernia 08/05/2016  . Incarcerated left inguinal hernia 08/05/2016  . Hypercholesteremia   . Hypertensive heart disease   . Coronary artery disease   . LVH (left ventricular hypertrophy)   . PAF (paroxysmal atrial fibrillation) ([place])   . NSTEMI (non-ST elevated myocardial infarction) ([place]) 03/26/2016  . Acute coronary syndrome ([place])   . GI bleed 10/03/2014  . H/O: CVA (cerebrovascular accident)   . CVA (cerebral infarction) 01/30/2014  . Right arm weakness 01/30/2014  . Hypertension 12/05/2010  . Hyperlipidemia 12/05/2010    Palliative Care Assessment & Plan    Assessment: 83 y.o.malewith medical history significant forSTEMI in 2017, prostate Ca, paroxysmal atrial fibrillation on apixaban, hypertension, gastric ulcers and GI bleed, CAD, CVA in 2015 (left cerebral hemisphere CVA on MRI)  Continued physical and functional decline,  lived alone a few months ago and wishes to retun home but with little family /social support  High risk for decompensation  Recommendations/Plan:   DNR docuemented today after discussion with patient , he understands and agrees  Treat the treatable and continued discussion for  disposition   Goals of Care and Additional Recommendations:  Patient is open to all offered and available medical interventions to prolong life  Patient continues to struggle with his limited disposition options and limited social support  Code Status:  DNR documented today     Code Status Orders        Start     Ordered   09/18/16 0049  Full code  Continuous     09/18/16 0048    Code Status History    Date Active Date Inactive Code Status Order ID Comments User Context   08/05/2016 12:19 PM 08/22/2016  6:38 PM Full Code [id number]  Kalman Drape, [place] [facility]   03/26/2016 10:44 AM 04/01/2016  5:34 PM Full Code [id number]  Troy Sine, MD Inpatient   10/03/2014  3:16 AM 10/04/2014  6:54 PM Full Code [id number]  Orvan Falconer, MD ED   01/30/2014  9:26 PM 02/01/2014  4:19 PM Full Code [id number]  Robbie Lis, MD Inpatient       Prognosis:   Unable to determine  Discharge Planning:  To Be Determined    Thank you for allowing the Palliative Medicine Team to assist in the care of this patient.   Time In: 0745 Time Out: 0830 Total Time 45 min Prolonged Time Billed  no       Greater than 50%  of this time was spent counseling and coordinating care related to the above assessment and plan.  Wadie Lessen, NP  Please contact Palliative Medicine Team phone at [phone] for questions and concerns.

## 2016-09-22 DIAGNOSIS — E7800 Pure hypercholesterolemia, unspecified: Secondary | ICD-10-CM

## 2016-09-22 DIAGNOSIS — E43 Unspecified severe protein-calorie malnutrition: Secondary | ICD-10-CM

## 2016-09-22 DIAGNOSIS — Z66 Do not resuscitate: Secondary | ICD-10-CM

## 2016-09-22 DIAGNOSIS — R7881 Bacteremia: Secondary | ICD-10-CM

## 2016-09-22 DIAGNOSIS — K851 Biliary acute pancreatitis without necrosis or infection: Principal | ICD-10-CM

## 2016-09-22 DIAGNOSIS — I480 Paroxysmal atrial fibrillation: Secondary | ICD-10-CM

## 2016-09-22 LAB — COMPREHENSIVE METABOLIC PANEL
ALT: 52 U/L (ref 17–63)
ANION GAP: 11 (ref 5–15)
AST: 33 U/L (ref 15–41)
Albumin: 1.9 g/dL — ABNORMAL LOW (ref 3.5–5.0)
Alkaline Phosphatase: 121 U/L (ref 38–126)
BUN: 5 mg/dL — ABNORMAL LOW (ref 6–20)
CHLORIDE: 107 mmol/L (ref 101–111)
CO2: 20 mmol/L — AB (ref 22–32)
Calcium: 7.9 mg/dL — ABNORMAL LOW (ref 8.9–10.3)
Creatinine, Ser: 0.74 mg/dL (ref 0.61–1.24)
GFR calc non Af Amer: >60 mL/min (ref >60)
Glucose, Bld: 72 mg/dL (ref 65–99)
Potassium: 3.7 mmol/L (ref 3.5–5.1)
SODIUM: 138 mmol/L (ref 135–145)
Total Bilirubin: 1.2 mg/dL (ref 0.3–1.2)
Total Protein: 4.6 g/dL — ABNORMAL LOW (ref 6.5–8.1)

## 2016-09-22 NOTE — Progress Notes (Signed)
PROGRESS NOTE        PATIENT DETAILS Name: Gregory [name] Age: 83 y.o. Sex: male Date of Birth: [birth year] Admit Date: 09/17/2016 Admitting Physician Sid Falcon, MD [id number] S, MD  Brief Narrative: Patient is a 83 y.o. male history of non-STEMI August 2017 (no significant lesions on cardiac cath to undergo PCI), paroxysmal atrial fibrillation on anticoagulation, history of CVA in 2015 admitted with acute abdominal pain, found to probable gallstone pancreatitis probably due to a past gallstone and gram-negative bacteremia. See below for further details  Subjective: Abd pain has resolved. No nausea and vomiting.   Assessment/Plan: Gallstone pancreatitis: Resolved, abdominal exam is benign- tolerating regular diet.MRCP negative for choledocholithiasis, GI does not recommend ERCP. Gen. surgery not advising to proceed with cholecystectomy given negative HIDA scan- and numerous other comorbidities in this elderly patient.  Klebsiella bacteremia: Likely secondary to passed gallbladder stone-continue ceftriaxone, repeat blood cultures on 2/5 negative so far. Will plan 10-14 days of antibiotic therapy from 2/5.  Paroxysmal atrial fibrillation: Rate controlled with amiodarone and metoprolol.Continue anticoagulation with apixaban   Mild troponin elevation: Probably due to demand ischemia, recent cardiac catheterization showed nonobstructive CAD on August 2017. Continue medical management.  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  Acute kidney injury: Likely mild prerenal azotemia in a setting of bacteremia/pancreatitis, resolved.  Thrombocytopenia: Likely secondary to bacteremia/acute infection, platelet count slowly improving. Follow occasionally.  Influenza: Was  on Tamiflu prior to this admission-stop date on 2/8.  Hypertension: Controlled-continue metoprolol.  Dyslipidemia: Continue statin  Anemia: Suspect secondary to acute illness-follow CBC periodically  Deep tissue injury to sacrum/moisture associated skin damage: Likely present prior to admission, wound care evaluation completed-continue recommendations per wound care  Severe malnutrition: Continue supplements  Palliative care: Poor overall long-term prognosis given multiple comorbidities-palliative care consulted during this hospital stay, now a DO NOT RESUSCITATE.  DVT Prophylaxis: Full dose anticoagulation with Eliquis  Code Status: DNR  Family Communication: None at bedside  Disposition Plan: Remain inpatient-SNF on discharge-likely 2/8  Antimicrobial agents: Anti-infectives    Start     Dose/Rate Route Frequency Ordered Stop   09/19/16 1600  metroNIDAZOLE (FLAGYL) IVPB 500 mg     500 mg 100 mL/hr over 60 Minutes Intravenous Every 8 hours 09/19/16 1443     09/19/16 1500  cefTRIAXone (ROCEPHIN) 2 g in dextrose 5 % 50 mL IVPB     2 g 100 mL/hr over 30 Minutes Intravenous Every 24 hours 09/19/16 1443     09/19/16 1430  piperacillin-tazobactam (ZOSYN) IVPB 3.375 g  Status:  Discontinued     3.375 g 12.5 mL/hr over 240 Minutes Intravenous Every 8 hours 09/19/16 1412 09/19/16 1443   09/18/16 1200  oseltamivir (TAMIFLU) capsule 30 mg    Comments:  FOR 10 DAYS     30 mg Oral 2 times daily 09/18/16 1131 09/23/16 0959   09/18/16 1000  oseltamivir (TAMIFLU) capsule 30 mg  Status:  Discontinued    Comments:  FOR 10 DAYS     30 mg Oral Daily 09/18/16 0048 09/18/16 1131      Procedures: Echo 2/5 When compared to the prior study from 08/13/2016 there is   improvement in pericardial effusion, now mild to moderate with  maximum thickness 1.4 cm located posteriorly, previously 2 cm. No   evidence of tamponade.   Severe biventricular myocardial hypertrophy suspicious for    cardiac amyloidosis.  CONSULTS:  GI and general surgery  Time spent: 25- minutes-Greater than 50% of this time was spent in counseling, explanation of diagnosis, planning of further management, and coordination of care.  MEDICATIONS: Scheduled Meds: . amiodarone  400 mg Oral Daily  . apixaban  2.5 mg Oral BID  . atorvastatin  40 mg Oral Daily  . brimonidine  1 drop Both Eyes QHS  . cefTRIAXone (ROCEPHIN)  IV  2 g Intravenous Q24H  . Chlorhexidine Gluconate Cloth  6 each Topical Daily  . feeding supplement (ENSURE ENLIVE)  237 mL Oral TID BM  . metoprolol  100 mg Oral BID  . metronidazole  500 mg Intravenous Q8H  . multivitamin with minerals  1 tablet Oral Daily  . mupirocin ointment  1 application Nasal BID  . oseltamivir  30 mg Oral BID  . pantoprazole  40 mg Oral BID  . senna  1 tablet Oral Daily  . sertraline  50 mg Oral Daily   Continuous Infusions: . sodium chloride 0.9 % 1,000 mL infusion 125 mL/hr at 09/21/16 1500   PRN Meds:.ondansetron, oxyCODONE, polyethylene glycol   PHYSICAL EXAM: Vital signs: Vitals:   09/21/16 1325 09/21/16 2214 09/22/16 0521 09/22/16 1323  BP: (!) 121/58 137/63 134/63 127/68  Pulse: 71 78 81 61  Resp: 18 18 16 16   Temp: 98.7 F (37.1 C) 98.4 F (36.9 C) 98.6 F (37 C) 98.4 F (36.9 C)  TempSrc: Oral Oral Oral   SpO2: 99% 99% 99% 100%  Weight:       Filed Weights   09/18/16 0049  Weight: 59.2 kg (130 lb 8.2 oz)   Body mass index is 18.2 kg/m.   General appearance :Awake, alert, not in any distress. Speech Clear.  Eyes:, pupils equally reactive to light and accomodation,no scleral icterus. HEENT: Atraumatic and Normocephalic Neck: supple, no JVD. No cervical lymphadenopathy. Resp:Good air entry bilaterally, no added sounds  CVS: S1 S2 regular, no murmurs.  GI: Bowel sounds present, Non tender and not distended with no gaurding, rigidity or rebound.No organomegaly Extremities: B/L Lower Ext shows no edema, both legs are warm  to touch Neurology:  speech clear,Non focal, sensation is grossly intact. Psychiatric: Alert and oriented x 3. Musculoskeletal:No digital cyanosis Skin:No Rash, warm and dry Wounds:N/A  I have personally reviewed following labs and imaging studies  LABORATORY DATA: CBC:  Recent Labs Lab 09/17/16 1827 09/18/16 0835 09/19/16 1607 09/20/16 0811  WBC 7.0 16.1* 7.6 7.3  NEUTROABS 4.7  --  5.8 5.2  HGB 14.2 12.7* 12.3* 10.7*  HCT 43.1 38.4* 37.6* 33.2*  MCV 84.3 84.0 84.5 84.5  PLT 202 108* 85* 99*    Basic Metabolic Panel:  Recent Labs Lab 09/17/16 1827 09/18/16 0835 09/19/16 0310 09/20/16 0811 09/22/16 0746  NA 138 139 138 140 138  K 4.4 4.5 4.3 4.2 3.7  CL 104 107 107 107 107  CO2 23 25 23 25  20*  GLUCOSE 134* 104* 104* 92 72  BUN 16 14 14 10  5*  CREATININE 1.29* 1.18 0.89 0.88 0.74  CALCIUM 9.4 8.8* 8.8* 8.4* 7.9*    GFR: Estimated Creatinine Clearance: 57.6 mL/min (by C-G formula based on SCr of 0.74 mg/dL).  Liver Function Tests:  Recent Labs Lab 09/17/16 1827 09/18/16 0835 09/19/16 0310 09/20/16 0811 09/22/16 0746  AST 46* 392* 182*  62* 33  ALT 35 269* 187* 101* 52  ALKPHOS 87 163* 152* 126 121  BILITOT 1.8* 2.2* 2.7* 1.6* 1.2  PROT 6.9 5.6* 5.5* 5.1* 4.6*  ALBUMIN 3.5 2.6* 2.5* 2.2* 1.9*    Recent Labs Lab 09/17/16 1827 09/19/16 0310 09/20/16 0811  LIPASE 101* 58* 58*   No results for input(s): AMMONIA in the last 168 hours.  Coagulation Profile:  Recent Labs Lab 09/18/16 0835  INR 2.02    Cardiac Enzymes:  Recent Labs Lab 09/17/16 1827 09/18/16 0114 09/18/16 0835  TROPONINI 0.05* 0.04* 0.05*    BNP (last 3 results) No results for input(s): PROBNP in the last 8760 hours.  HbA1C: No results for input(s): HGBA1C in the last 72 hours.  CBG: No results for input(s): GLUCAP in the last 168 hours.  Lipid Profile: No results for input(s): CHOL, HDL, LDLCALC, TRIG, CHOLHDL, LDLDIRECT in the last 72 hours.  Thyroid  Function Tests: No results for input(s): TSH, T4TOTAL, FREET4, T3FREE, THYROIDAB in the last 72 hours.  Anemia Panel: No results for input(s): VITAMINB12, FOLATE, FERRITIN, TIBC, IRON, RETICCTPCT in the last 72 hours.  Urine analysis:    Component Value Date/Time   COLORURINE AMBER (A) 09/18/2016 1104   APPEARANCEUR CLEAR 09/18/2016 1104   LABSPEC 1.021 09/18/2016 1104   PHURINE 5.0 09/18/2016 1104   GLUCOSEU NEGATIVE 09/18/2016 1104   HGBUR NEGATIVE 09/18/2016 1104   BILIRUBINUR NEGATIVE 09/18/2016 1104   KETONESUR 5 (A) 09/18/2016 1104   PROTEINUR NEGATIVE 09/18/2016 1104   UROBILINOGEN 4.0 (H) 01/30/2014 1402   NITRITE NEGATIVE 09/18/2016 1104   LEUKOCYTESUR NEGATIVE 09/18/2016 1104    Sepsis Labs: Lactic Acid, Venous    Component Value Date/Time   LATICACIDVEN 1.6 09/18/2016 0405    MICROBIOLOGY: Recent Results (from the past 240 hour(s))  Surgical pcr screen     Status: Abnormal   Collection Time: 09/18/16  1:02 AM  Result Value Ref Range Status   MRSA, PCR NEGATIVE NEGATIVE Final   Staphylococcus aureus POSITIVE (A) NEGATIVE Final    Comment:        The Xpert SA Assay (FDA approved for NASAL specimens in patients over 96 years of age), is one component of a comprehensive surveillance program.  Test performance has been validated by [facility] for patients greater than or equal to 52 year old. It is not intended to diagnose infection nor to guide or monitor treatment.   Culture, blood (routine x 2)     Status: Abnormal   Collection Time: 09/18/16  8:25 PM  Result Value Ref Range Status   Specimen Description BLOOD LEFT ARM  Final   Special Requests BOTTLES DRAWN AEROBIC ONLY  8CC  Final   Culture  Setup Time   Final    GRAM NEGATIVE RODS AEROBIC BOTTLE ONLY CRITICAL RESULT CALLED TO, READ BACK BY AND VERIFIED WITH: E. MARTIN, PHARM, 09/19/16 AT 1428 BY J FUDESCO    Culture (A)  Final    KLEBSIELLA OXYTOCA SUSCEPTIBILITIES PERFORMED ON PREVIOUS  CULTURE WITHIN THE LAST 5 DAYS.    Report Status 09/21/2016 FINAL  Final  Blood Culture ID Panel (Reflexed)     Status: Abnormal   Collection Time: 09/18/16  8:25 PM  Result Value Ref Range Status   Enterococcus species NOT DETECTED NOT DETECTED Final   Listeria monocytogenes NOT DETECTED NOT DETECTED Final   Staphylococcus species NOT DETECTED NOT DETECTED Final   Staphylococcus aureus NOT DETECTED NOT DETECTED Final   Streptococcus species NOT DETECTED  NOT DETECTED Final   Streptococcus agalactiae NOT DETECTED NOT DETECTED Final   Streptococcus pneumoniae NOT DETECTED NOT DETECTED Final   Streptococcus pyogenes NOT DETECTED NOT DETECTED Final   Acinetobacter baumannii NOT DETECTED NOT DETECTED Final   Enterobacteriaceae species DETECTED (A) NOT DETECTED Final    Comment: Enterobacteriaceae represent a large family of gram-negative bacteria, not a single organism. CRITICAL RESULT CALLED TO, READ BACK BY AND VERIFIED WITH: E. MARTIN, 09/19/16 AT 45 BY J FUDESCO    Enterobacter cloacae complex NOT DETECTED NOT DETECTED Final   Escherichia coli NOT DETECTED NOT DETECTED Final   Klebsiella oxytoca DETECTED (A) NOT DETECTED Final    Comment: CRITICAL RESULT CALLED TO, READ BACK BY AND VERIFIED WITH: E. MARTIN, 09/19/16 AT 1428 BY J FUDESCO    Klebsiella pneumoniae NOT DETECTED NOT DETECTED Final   Proteus species NOT DETECTED NOT DETECTED Final   Serratia marcescens NOT DETECTED NOT DETECTED Final   Carbapenem resistance NOT DETECTED NOT DETECTED Final   Haemophilus influenzae NOT DETECTED NOT DETECTED Final   Neisseria meningitidis NOT DETECTED NOT DETECTED Final   Pseudomonas aeruginosa NOT DETECTED NOT DETECTED Final   Candida albicans NOT DETECTED NOT DETECTED Final   Candida glabrata NOT DETECTED NOT DETECTED Final   Candida krusei NOT DETECTED NOT DETECTED Final   Candida parapsilosis NOT DETECTED NOT DETECTED Final   Candida tropicalis NOT DETECTED NOT DETECTED Final  Culture,  blood (routine x 2)     Status: Abnormal   Collection Time: 09/18/16  8:34 PM  Result Value Ref Range Status   Specimen Description BLOOD RIGHT HAND  Final   Special Requests IN PEDIATRIC BOTTLE  3CC  Final   Culture  Setup Time   Final    GRAM NEGATIVE RODS IN PEDIATRIC BOTTLE CRITICAL RESULT CALLED TO, READ BACK BY AND VERIFIED WITH: EHassell Done, PHARM, 09/19/16 AT 1428 BY J FUDESCO    Culture KLEBSIELLA OXYTOCA (A)  Final   Report Status 09/21/2016 FINAL  Final   Organism ID, Bacteria KLEBSIELLA OXYTOCA  Final      Susceptibility   Klebsiella oxytoca - MIC*    AMPICILLIN RESISTANT Resistant     CEFAZOLIN <=4 SENSITIVE Sensitive     CEFEPIME <=1 SENSITIVE Sensitive     CEFTAZIDIME <=1 SENSITIVE Sensitive     CEFTRIAXONE <=1 SENSITIVE Sensitive     CIPROFLOXACIN <=0.25 SENSITIVE Sensitive     GENTAMICIN <=1 SENSITIVE Sensitive     IMIPENEM <=0.25 SENSITIVE Sensitive     TRIMETH/SULFA <=20 SENSITIVE Sensitive     AMPICILLIN/SULBACTAM 4 SENSITIVE Sensitive     PIP/TAZO <=4 SENSITIVE Sensitive     Extended ESBL NEGATIVE Sensitive     * KLEBSIELLA OXYTOCA  Blood Culture ID Panel (Reflexed)     Status: Abnormal   Collection Time: 09/18/16  8:34 PM  Result Value Ref Range Status   Enterococcus species NOT DETECTED NOT DETECTED Final   Listeria monocytogenes NOT DETECTED NOT DETECTED Final   Staphylococcus species NOT DETECTED NOT DETECTED Final   Staphylococcus aureus NOT DETECTED NOT DETECTED Final   Streptococcus species NOT DETECTED NOT DETECTED Final   Streptococcus agalactiae NOT DETECTED NOT DETECTED Final   Streptococcus pneumoniae NOT DETECTED NOT DETECTED Final   Streptococcus pyogenes NOT DETECTED NOT DETECTED Final   Acinetobacter baumannii NOT DETECTED NOT DETECTED Final   Enterobacteriaceae species DETECTED (A) NOT DETECTED Final    Comment: Enterobacteriaceae represent a large family of gram-negative bacteria, not a single organism. CRITICAL RESULT CALLED  TO, READ BACK  BY AND VERIFIED WITH: E. MARTIN, PHARM, 09/19/16 AT 3 BY J FUDESCO    Enterobacter cloacae complex NOT DETECTED NOT DETECTED Final   Escherichia coli NOT DETECTED NOT DETECTED Final   Klebsiella oxytoca DETECTED (A) NOT DETECTED Final    Comment: CRITICAL RESULT CALLED TO, READ BACK BY AND VERIFIED WITH: E. MARTIN , PHARM, 09/19/16 AT 1428 BY J FUDESCO    Klebsiella pneumoniae NOT DETECTED NOT DETECTED Final   Proteus species NOT DETECTED NOT DETECTED Final   Serratia marcescens NOT DETECTED NOT DETECTED Final   Carbapenem resistance NOT DETECTED NOT DETECTED Final   Haemophilus influenzae NOT DETECTED NOT DETECTED Final   Neisseria meningitidis NOT DETECTED NOT DETECTED Final   Pseudomonas aeruginosa NOT DETECTED NOT DETECTED Final   Candida albicans NOT DETECTED NOT DETECTED Final   Candida glabrata NOT DETECTED NOT DETECTED Final   Candida krusei NOT DETECTED NOT DETECTED Final   Candida parapsilosis NOT DETECTED NOT DETECTED Final   Candida tropicalis NOT DETECTED NOT DETECTED Final  Culture, blood (routine x 2)     Status: None (Preliminary result)   Collection Time: 09/20/16  3:19 PM  Result Value Ref Range Status   Specimen Description BLOOD LEFT ANTECUBITAL  Final   Special Requests BOTTLES DRAWN AEROBIC ONLY  5 CC  Final   Culture NO GROWTH < 24 HOURS  Final   Report Status PENDING  Incomplete  Culture, blood (Routine X 2) w Reflex to ID Panel     Status: None (Preliminary result)   Collection Time: 09/20/16  4:21 PM  Result Value Ref Range Status   Specimen Description BLOOD RIGHT HAND  Final   Special Requests IN PEDIATRIC BOTTLE 3CC  Final   Culture NO GROWTH < 24 HOURS  Final   Report Status PENDING  Incomplete    RADIOLOGY STUDIES/RESULTS: Mr 3d Recon At Scanner  Result Date: 09/19/2016 CLINICAL DATA:  83 year old male inpatient admitted with upper abdominal pain with elevated liver function tests and diagnosed with acute pancreatitis. Leukocytosis. Cholelithiasis  on abdominal sonogram. Status post incarcerated left inguinal hernia repair in December 2017. EXAM: MRI ABDOMEN WITHOUT AND WITH CONTRAST (INCLUDING MRCP) TECHNIQUE: Multiplanar multisequence MR imaging of the abdomen was performed both before and after the administration of intravenous contrast. Heavily T2-weighted images of the biliary and pancreatic ducts were obtained, and three-dimensional MRCP images were rendered by post processing. CONTRAST:  76mL MULTIHANCE GADOBENATE DIMEGLUMINE 529 MG/ML IV SOLN COMPARISON:  09/17/2016 right upper quadrant abdominal sonogram. 08/05/2016 CT abdomen/pelvis. FINDINGS: Significantly motion degraded scan. Lower chest: Cardiomegaly. Small to moderate pericardial effusion/thickening, decreased since 08/05/2016 CT study. Mild compressive atelectasis at the left lung base. Hepatobiliary: Normal liver size and configuration. No hepatic steatosis. There are several scattered tiny subcentimeter simple liver cysts throughout the liver. No suspicious liver masses. Several irregular foci of arterial phase hyperenhancement throughout the liver are occult on all other sequences and are favored to represent benign transient perfusional phenomena. There is gallbladder distention. There are a few layering gallstones measuring up to 10 mm in size. There is moderate sludge in the gallbladder. There is mild diffuse gallbladder wall thickening without pericholecystic fluid. No intrahepatic biliary ductal dilatation. Common bile duct diameter 6 mm, within normal limits for age. There is ill-defined intermediate signal intensity material throughout the common bile duct compatible with sludge. No discrete stones in the bile ducts. No biliary or ampullary mass. Pancreas: Normal size pancreas. Normal pancreatic parenchymal signal intensity. No peripancreatic fluid  collections. No pancreatic mass or duct dilation. No no evidence of pancreas divisum. Spleen: Normal size. No mass. Adrenals/Urinary  Tract: No discrete adrenal nodules. No hydronephrosis. Normal kidneys with no renal mass. Stomach/Bowel: Grossly normal stomach. Visualized small and large bowel is normal caliber, with no bowel wall thickening. Vascular/Lymphatic: Atherosclerotic nonaneurysmal abdominal aorta . Patent portal, splenic, hepatic and renal veins. No pathologically enlarged lymph nodes in the abdomen. Other: No abdominal ascites or focal fluid collection. Musculoskeletal: No aggressive appearing focal osseous lesions. IMPRESSION: 1. Cholelithiasis. Moderate gallbladder sludge. Diffuse gallbladder wall thickening. Gallbladder distention. No pericholecystic fluid. MRI findings are equivocal for acute cholecystitis and correlation with clinical exam, right upper quadrant abdominal sonogram and/ or hepatobiliary scintigraphy can be performed as clinically warranted. 2. No biliary ductal dilatation. Scattered sludge throughout the common bile duct without appreciable choledocholithiasis. No biliary mass or stricture. 3. No MRI findings of acute pancreatitis. No peripancreatic fluid collections. No pancreatic duct dilation. 4. Cardiomegaly. Small to moderate pericardial effusion/thickening, decreased since 08/05/2016 CT study. 5. Aortic atherosclerosis. Electronically Signed   By: Ilona Sorrel M.D.   On: 09/19/2016 15:07   Nm Hepato W/eject Fract  Result Date: 09/21/2016 CLINICAL DATA:  Upper abdominal pain and vomiting with known cholelithiasis EXAM: NUCLEAR MEDICINE HEPATOBILIARY IMAGING TECHNIQUE: Sequential images of the abdomen were obtained out to 60 minutes following intravenous administration of radiopharmaceutical. RADIOPHARMACEUTICALS:  8.4 mCi Tc-57m  Choletec IV COMPARISON:  None. FINDINGS: Prompt uptake and excretion of biliary tracer is noted. Biliary activity passes into small bowel, consistent with patent common bile duct. The gallbladder was not visualized at 90 minutes. Subsequent 2.4 mg of morphine was administered  intravenously and all bladder visualization was noted. The gallbladder distends in a normal fashion. IMPRESSION: Normal uptake and excretion of biliary tracer. Gallbladder visualization is noted following morphine administration consistent with a patent cystic duct. Electronically Signed   By: Inez Catalina M.D.   On: 09/21/2016 12:58   US Abdomen Limited  Result Date: 09/17/2016 CLINICAL DATA:  Initial valuation for acute abdominal pain. EXAM: US ABDOMEN LIMITED - RIGHT UPPER QUADRANT COMPARISON:  Prior CT from 08/15/2016. FINDINGS: Gallbladder: Few echogenic stones present within the gallbladder lumen, largest of which measured 1.5 cm. Associated gallbladder sludge. Gallbladder wall measured within normal limits at 2.8 mm. No free pericholecystic fluid. No sonographic Murphy sign elicited on exam. Common bile duct: Diameter: 6.6 mm Liver: No focal lesion identified. Within normal limits in parenchymal echogenicity. Left hepatic lobe not well visualized. IMPRESSION: Cholelithiasis with gallbladder sludge. No other sonographic features to suggest acute cholecystitis. No biliary dilatation. Electronically Signed   By: Jeannine Boga M.D.   On: 09/17/2016 21:09   Dg Abd 2 Views  Result Date: 09/18/2016 CLINICAL DATA:  83 year old male with generalized abdominal pain and constipation. Personal history of gastrointestinal bleed. Initial encounter. EXAM: ABDOMEN - 2 VIEW COMPARISON:  0952 hours today and earlier. FINDINGS: Upright and supine views of the abdomen and pelvis no pneumoperitoneum. Non obstructed bowel gas pattern. Cholelithiasis again noted. Extensive pelvic surgical clips again noted. Stable visualized osseous structures. Negative visible lung bases. Iliofemoral calcified atherosclerosis. IMPRESSION: Normal bowel gas pattern, no free air. Cholelithiasis. Electronically Signed   By: Genevie Ann M.D.   On: 09/18/2016 15:24   Dg Abd Portable 1v  Result Date: 09/18/2016 CLINICAL DATA:  83 year old  male with chest and abdominal pain since yesterday. Initial encounter. EXAM: PORTABLE ABDOMEN - 1 VIEW COMPARISON:  Right upper quadrant ultrasound 09/17/2016. CT Abdomen and Pelvis 10/07/2015  FINDINGS: Portable AP supine view at 0952 hours. Non obstructed bowel gas pattern. Cholelithiasis again noted. Stable abdominal and pelvic visceral contours. Stable osseous degenerative changes. Extensive pelvic sidewall surgical clips again noted. IMPRESSION: Normal bowel gas pattern.  Cholelithiasis. Electronically Signed   By: Genevie Ann M.D.   On: 09/18/2016 10:02   Mr Abdomen Mrcp Moise Boring Contast  Result Date: 09/19/2016 CLINICAL DATA:  83 year old male inpatient admitted with upper abdominal pain with elevated liver function tests and diagnosed with acute pancreatitis. Leukocytosis. Cholelithiasis on abdominal sonogram. Status post incarcerated left inguinal hernia repair in December 2017. EXAM: MRI ABDOMEN WITHOUT AND WITH CONTRAST (INCLUDING MRCP) TECHNIQUE: Multiplanar multisequence MR imaging of the abdomen was performed both before and after the administration of intravenous contrast. Heavily T2-weighted images of the biliary and pancreatic ducts were obtained, and three-dimensional MRCP images were rendered by post processing. CONTRAST:  29mL MULTIHANCE GADOBENATE DIMEGLUMINE 529 MG/ML IV SOLN COMPARISON:  09/17/2016 right upper quadrant abdominal sonogram. 08/05/2016 CT abdomen/pelvis. FINDINGS: Significantly motion degraded scan. Lower chest: Cardiomegaly. Small to moderate pericardial effusion/thickening, decreased since 08/05/2016 CT study. Mild compressive atelectasis at the left lung base. Hepatobiliary: Normal liver size and configuration. No hepatic steatosis. There are several scattered tiny subcentimeter simple liver cysts throughout the liver. No suspicious liver masses. Several irregular foci of arterial phase hyperenhancement throughout the liver are occult on all other sequences and are favored to  represent benign transient perfusional phenomena. There is gallbladder distention. There are a few layering gallstones measuring up to 10 mm in size. There is moderate sludge in the gallbladder. There is mild diffuse gallbladder wall thickening without pericholecystic fluid. No intrahepatic biliary ductal dilatation. Common bile duct diameter 6 mm, within normal limits for age. There is ill-defined intermediate signal intensity material throughout the common bile duct compatible with sludge. No discrete stones in the bile ducts. No biliary or ampullary mass. Pancreas: Normal size pancreas. Normal pancreatic parenchymal signal intensity. No peripancreatic fluid collections. No pancreatic mass or duct dilation. No no evidence of pancreas divisum. Spleen: Normal size. No mass. Adrenals/Urinary Tract: No discrete adrenal nodules. No hydronephrosis. Normal kidneys with no renal mass. Stomach/Bowel: Grossly normal stomach. Visualized small and large bowel is normal caliber, with no bowel wall thickening. Vascular/Lymphatic: Atherosclerotic nonaneurysmal abdominal aorta . Patent portal, splenic, hepatic and renal veins. No pathologically enlarged lymph nodes in the abdomen. Other: No abdominal ascites or focal fluid collection. Musculoskeletal: No aggressive appearing focal osseous lesions. IMPRESSION: 1. Cholelithiasis. Moderate gallbladder sludge. Diffuse gallbladder wall thickening. Gallbladder distention. No pericholecystic fluid. MRI findings are equivocal for acute cholecystitis and correlation with clinical exam, right upper quadrant abdominal sonogram and/ or hepatobiliary scintigraphy can be performed as clinically warranted. 2. No biliary ductal dilatation. Scattered sludge throughout the common bile duct without appreciable choledocholithiasis. No biliary mass or stricture. 3. No MRI findings of acute pancreatitis. No peripancreatic fluid collections. No pancreatic duct dilation. 4. Cardiomegaly. Small to  moderate pericardial effusion/thickening, decreased since 08/05/2016 CT study. 5. Aortic atherosclerosis. Electronically Signed   By: Ilona Sorrel M.D.   On: 09/19/2016 15:07     LOS: 3 days   Oren Binet, MD  [facility] Pager:[phone]  If 7PM-7AM, please contact night-coverage [email or web address] Password [facility] 09/22/2016, 2:36 PM

## 2016-09-22 NOTE — Clinical Social Work Note (Addendum)
PT has recommended SNF at this time however, pt is adamant about going home at d/c. CSW spoke with pt at bedside. CSW attempted to express the importance of SNF placement, identifying that home was not a safe option. Pt became very agitated and stated "im going home, if I had someone to come get me today I would be gone." Pt reports "I already told the doctor I was going home, and the doctor said I was going home". Pt would hardly let CSW speak. Pt continue to state he was going home through conversation. Pt is refusing SNF and reports he has neighbors who can help in his care after he goes home. RNCM notified.MD notified. Clinical Social Worker will sign off for now as social work intervention is no longer needed. Please consult [place] again if new need arises.   Gerrianne Scale Mayton 09/22/2016

## 2016-09-22 NOTE — NC FL2 (Signed)
[place] LEVEL OF CARE SCREENING TOOL     IDENTIFICATION  Patient Name: Gregory [name] Birthdate: 10/06/1929 Sex: male Admission Date (Current Location): 09/17/2016  [facility] and [place] Number:  Herbalist and Address:  [facility], [place] [address], [place], [place] [address]      Provider Number: [id number]  Attending Physician Name and Address:  Jonetta Osgood, MD  Relative Name and Phone Number:       Current Level of Care: Hospital Recommended Level of Care: [place] Prior Approval Number:    Date Approved/Denied:   PASRR Number:  [id number]   Discharge Plan: SNF    Current Diagnoses: Patient Active Problem List   Diagnosis Date Noted  . DNR (do not resuscitate)   . DNR (do not resuscitate) discussion 09/19/2016  . Palliative care by specialist 09/19/2016  . Bacteremia due to Gram-negative bacteria 09/19/2016  . Cholelithiases 09/19/2016  . Gallstone pancreatitis: Probable 09/19/2016  . Biliary colic: Probable [id number]  . Abnormal LFTs   . Pressure injury of skin 09/18/2016  . Protein-calorie malnutrition, severe 09/18/2016  . Abdominal pain 09/17/2016  . Adult failure to thrive 08/24/2016  . Persistent atrial fibrillation ([place])   . Anticoagulated   . Difficulty in walking, not elsewhere classified   . Melanotic stools 08/14/2016  . Postoperative anemia due to acute blood loss 08/14/2016  . Pericardial effusion   . Hypokalemia   . Malnutrition of moderate degree 08/06/2016  . Hypotension   . Cardiac tamponade   . Chronic diastolic congestive heart failure ([place]) 08/05/2016  . Incarcerated inguinal hernia 08/05/2016  . Incarcerated left inguinal hernia 08/05/2016  . Hypercholesteremia   . Hypertensive heart disease   . Coronary artery disease   . LVH (left ventricular hypertrophy)   . PAF (paroxysmal atrial fibrillation) ([place])   . NSTEMI (non-ST elevated myocardial infarction) ([place])  03/26/2016  . Acute coronary syndrome ([place])   . GI bleed 10/03/2014  . H/O: CVA (cerebrovascular accident)   . CVA (cerebral infarction) 01/30/2014  . Right arm weakness 01/30/2014  . Hypertension 12/05/2010  . Hyperlipidemia 12/05/2010    Orientation RESPIRATION BLADDER Height & Weight     Self, Time, Situation, Place  Normal Continent Weight: 130 lb 8.2 oz (59.2 kg) Height:     BEHAVIORAL SYMPTOMS/MOOD NEUROLOGICAL BOWEL NUTRITION STATUS      Continent  (Please see discharge summary)  AMBULATORY STATUS COMMUNICATION OF NEEDS Skin   Limited Assist Verbally PU Stage and Appropriate Care (Right buttocks, foam dressing.)   PU Stage 2 Dressing:  (Dressing change, every 5 days.)                   Personal Care Assistance Level of Assistance  Bathing, Feeding, Dressing Bathing Assistance: Limited assistance Feeding assistance: Independent Dressing Assistance: Limited assistance     Functional Limitations Info  Sight, Hearing, Speech Sight Info: 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.) Hearing Info: Adequate Speech Info: Adequate    SPECIAL CARE FACTORS FREQUENCY  PT (By licensed PT), OT (By licensed OT)     PT Frequency: 3x week OT Frequency: 3x week            Contractures Contractures Info: Not present    Additional Factors Info  Code Status, Allergies, Isolation Precautions Code Status Info: DNR Allergies Info: Sulfa Antibiotics, Ramipril, Ramipril, Sulfamethoxazole     Isolation Precautions Info: Droplet Precaution     Current Medications (09/22/2016):  This is  the current hospital active medication list Current Facility-Administered Medications  Medication Dose Route Frequency Provider Last Rate Last Dose  . amiodarone (PACERONE) tablet 400 mg  400 mg Oral Daily Sid Falcon, MD   400 mg at 09/21/16 1321  . apixaban (ELIQUIS) tablet 2.5 mg  2.5 mg Oral BID Cecilio Asper Batchelder, RPH   2.5 mg at 09/21/16 2257  . atorvastatin (LIPITOR) tablet 40 mg  40 mg Oral Daily  Sid Falcon, MD   40 mg at 09/21/16 1322  . brimonidine (ALPHAGAN) 0.2 % ophthalmic solution 1 drop  1 drop Both Eyes QHS Sid Falcon, MD   1 drop at 09/21/16 2258  . cefTRIAXone (ROCEPHIN) 2 g in dextrose 5 % 50 mL IVPB  2 g Intravenous Q24H Eugenie Filler, MD   2 g at 09/21/16 1557  . Chlorhexidine Gluconate Cloth 2 % PADS 6 each  6 each Topical Daily Robbie Lis, MD   6 each at 09/21/16 1000  . feeding supplement (ENSURE ENLIVE) (ENSURE ENLIVE) liquid 237 mL  237 mL Oral TID BM Robbie Lis, MD   237 mL at 09/21/16 2000  . metoprolol (LOPRESSOR) tablet 100 mg  100 mg Oral BID Sid Falcon, MD   100 mg at 09/21/16 2257  . metroNIDAZOLE (FLAGYL) IVPB 500 mg  500 mg Intravenous Q8H Eugenie Filler, MD   500 mg at 09/22/16 0054  . multivitamin with minerals tablet 1 tablet  1 tablet Oral Daily Sid Falcon, MD   1 tablet at 09/20/16 0825  . mupirocin ointment (BACTROBAN) 2 % 1 application  1 application Nasal BID Robbie Lis, MD   1 application at [id number]  . ondansetron (ZOFRAN-ODT) disintegrating tablet 4 mg  4 mg Oral Q6H PRN Sid Falcon, MD      . oseltamivir (TAMIFLU) capsule 30 mg  30 mg Oral BID Jonetta Osgood, MD   30 mg at 09/21/16 1000  . oxyCODONE (Oxy IR/ROXICODONE) immediate release tablet 5 mg  5 mg Oral Q6H PRN Sid Falcon, MD   5 mg at 09/20/16 1601  . pantoprazole (PROTONIX) EC tablet 40 mg  40 mg Oral BID Sid Falcon, MD   40 mg at 09/21/16 2257  . polyethylene glycol (MIRALAX / GLYCOLAX) packet 17 g  17 g Oral Daily PRN Sid Falcon, MD      . senna [facility]) tablet 8.6 mg  1 tablet Oral Daily Sid Falcon, MD   8.6 mg at 09/20/16 0825  . sertraline (ZOLOFT) tablet 50 mg  50 mg Oral Daily Sid Falcon, MD   50 mg at 09/20/16 [id number]  . sodium chloride 0.9 % 1,000 mL infusion   Intravenous Continuous Eugenie Filler, MD 125 mL/hr at 09/21/16 1500       Discharge Medications: Please see discharge summary for a list of discharge  medications.  Relevant Imaging Results:  Relevant Lab Results:   Additional Information SS#: [id number]  Ht: 5\' 11"  (1.803 m)  Wt: 130 lb 8.2 oz (59.2 kg)     Jennifier Smitherman A [place], LCSW

## 2016-09-22 NOTE — Progress Notes (Signed)
Subjective: He wants to go home.  Complaining of not having Partial plate and trouble swallowing.  Especially in bed.  Nurse tells me he has to be fed.  He doesn't do well on his own.  NO abdominal pain at this point.  Multiple medical issues.  Objective: Vital signs in last 24 hours: Temp:  [98.4 F (36.9 C)-98.7 F (37.1 C)] 98.6 F (37 C) (02/07 0521) Pulse Rate:  [71-81] 81 (02/07 0521) Resp:  [16-18] 16 (02/07 0521) BP: (121-137)/(58-63) 134/63 (02/07 0521) SpO2:  [99 %] 99 % (02/07 0521) Last BM Date: 09/16/16 PO 100 recorded 2000 IV Urine 450 Afebrile, VSS Lipase 101 => 58 =>>58 LFT's are back to normal.   Intake/Output from previous day: 02/06 0701 - 02/07 0700 In: 2125 [P.O.:100; I.V.:1875; IV Piggyback:150] Out: 450 [Urine:450] Intake/Output this shift: Total I/O In: 60 [P.O.:60] Out: -   General appearance: alert, cooperative and no distress GI: soft, non-tender; bowel sounds normal; no masses,  no organomegaly  Lab Results:   Recent Labs  09/19/16 1607 09/20/16 0811  WBC 7.6 7.3  HGB 12.3* 10.7*  HCT 37.6* 33.2*  PLT 85* 99*    BMET  Recent Labs  09/20/16 0811 09/22/16 0746  NA 140 138  K 4.2 3.7  CL 107 107  CO2 25 20*  GLUCOSE 92 72  BUN 10 5*  CREATININE 0.88 0.74  CALCIUM 8.4* 7.9*   PT/INR No results for input(s): LABPROT, INR in the last 72 hours.   Recent Labs Lab 09/17/16 1827 09/18/16 0835 09/19/16 0310 09/20/16 0811 09/22/16 0746  AST 46* 392* 182* 62* 33  ALT 35 269* 187* 101* 52  ALKPHOS 87 163* 152* 126 121  BILITOT 1.8* 2.2* 2.7* 1.6* 1.2  PROT 6.9 5.6* 5.5* 5.1* 4.6*  ALBUMIN 3.5 2.6* 2.5* 2.2* 1.9*     Lipase     Component Value Date/Time   LIPASE 58 (H) 09/20/2016 0811     Studies/Results: Nm Hepato W/eject Fract  Result Date: 09/21/2016 CLINICAL DATA:  Upper abdominal pain and vomiting with known cholelithiasis EXAM: NUCLEAR MEDICINE HEPATOBILIARY IMAGING TECHNIQUE: Sequential images of the abdomen  were obtained out to 60 minutes following intravenous administration of radiopharmaceutical. RADIOPHARMACEUTICALS:  8.4 mCi Tc-17m  Choletec IV COMPARISON:  None. FINDINGS: Prompt uptake and excretion of biliary tracer is noted. Biliary activity passes into small bowel, consistent with patent common bile duct. The gallbladder was not visualized at 90 minutes. Subsequent 2.4 mg of morphine was administered intravenously and all bladder visualization was noted. The gallbladder distends in a normal fashion. IMPRESSION: Normal uptake and excretion of biliary tracer. Gallbladder visualization is noted following morphine administration consistent with a patent cystic duct. Electronically Signed   By: Inez Catalina M.D.   On: 09/21/2016 12:58    Medications: . amiodarone  400 mg Oral Daily  . apixaban  2.5 mg Oral BID  . atorvastatin  40 mg Oral Daily  . brimonidine  1 drop Both Eyes QHS  . cefTRIAXone (ROCEPHIN)  IV  2 g Intravenous Q24H  . Chlorhexidine Gluconate Cloth  6 each Topical Daily  . feeding supplement (ENSURE ENLIVE)  237 mL Oral TID BM  . metoprolol  100 mg Oral BID  . metronidazole  500 mg Intravenous Q8H  . multivitamin with minerals  1 tablet Oral Daily  . mupirocin ointment  1 application Nasal BID  . oseltamivir  30 mg Oral BID  . pantoprazole  40 mg Oral BID  . senna  1 tablet Oral Daily  . sertraline  50 mg Oral Daily   . sodium chloride 0.9 % 1,000 mL infusion 125 mL/hr at 09/21/16 1500    Assessment/Plan Abdominal and chest pain Cholelithiasis and gallstone pancreatitis - lipase up to 101 Bacteremia  - Klebsiella oxytoca Mild troponin elevation Prior STEMI PAF on Apixaban BID daily Anemia  Failure to thrive/PCM DEPRESSION Flu - on Tamiflu Thrombocytopenia FEN:  IV fluids/regular diet DVT  On Eliquis ID: Day 5 Tamiflu, day 4 Ceftriaxone/Flagyl  Plan:  Currently no surgery planned. Medical management.  LOS: 3 days    Jaquaya Coyle 09/22/2016 [phone]

## 2016-09-23 MED ORDER — AMIODARONE HCL 100 MG PO TABS: 300.0000 mg | ORAL_TABLET | Freq: Every day | ORAL | 0 refills | Status: DC

## 2016-09-23 MED ORDER — METOPROLOL TARTRATE 100 MG PO TABS: 100.0000 mg | ORAL_TABLET | Freq: Two times a day (BID) | ORAL | 0 refills | Status: AC

## 2016-09-23 MED ORDER — AMOXICILLIN-POT CLAVULANATE 875-125 MG PO TABS: 1.0000 | ORAL_TABLET | Freq: Two times a day (BID) | ORAL | Status: DC

## 2016-09-23 MED ORDER — PANTOPRAZOLE SODIUM 40 MG PO TBEC: 40.0000 mg | DELAYED_RELEASE_TABLET | Freq: Two times a day (BID) | ORAL | 0 refills | Status: DC

## 2016-09-23 MED ORDER — SERTRALINE HCL 50 MG PO TABS: 50.0000 mg | ORAL_TABLET | Freq: Every day | ORAL | 0 refills | Status: AC

## 2016-09-23 MED ORDER — AMOXICILLIN-POT CLAVULANATE 875-125 MG PO TABS: 1.0000 | ORAL_TABLET | Freq: Two times a day (BID) | ORAL | 0 refills | Status: DC

## 2016-09-23 MED ORDER — CIPROFLOXACIN HCL 500 MG PO TABS: 500.0000 mg | ORAL_TABLET | Freq: Two times a day (BID) | ORAL | Status: DC

## 2016-09-23 MED ORDER — MIRTAZAPINE 15 MG PO TBDP: 7.5000 mg | ORAL_TABLET | Freq: Every day | ORAL | 0 refills | Status: AC

## 2016-09-23 MED ORDER — BRIMONIDINE TARTRATE 0.2 % OP SOLN: 1.0000 [drp] | Freq: Every day | OPHTHALMIC | 0 refills | Status: AC

## 2016-09-23 MED ORDER — ATORVASTATIN CALCIUM 40 MG PO TABS: 40.0000 mg | ORAL_TABLET | Freq: Every day | ORAL | 0 refills | Status: DC

## 2016-09-23 MED ORDER — ENSURE ENLIVE PO LIQD: 237.0000 mL | Freq: Two times a day (BID) | ORAL | 0 refills | Status: DC

## 2016-09-23 MED ORDER — APIXABAN 5 MG PO TABS: 5.0000 mg | ORAL_TABLET | Freq: Two times a day (BID) | ORAL | 0 refills | Status: DC

## 2016-09-23 NOTE — Progress Notes (Signed)
Physical Therapy Treatment Patient Details Name: Gregory [name] MRN: [id number] DOB: 1929-10-30 Today's Date: 09/23/2016    History of Present Illness 83 y.o.malewith chest and abdominal pain likely secondary to gallstone pancreatitis versus biliary colic, influenza, and bacteremia. PMH: STEMI in 2017, prostate Ca, paroxysmal atrial fibrillation, hypertension, gastric ulcers and GI bleed, CAD, CVA in 2015.    PT Comments    Patient continues to demonstrate generalized weakness, decreased activity tolerance, balance and cognitive deficits that decrease pt's safety with mobility. Pt is addiment about returning home. When asked about DME and assistance at home pt appeared confused and gave differing answers during session. Family present but it was still not clear what type of assistance will be available. Pt will need 24 hour supervision/assistance for safe mobility at home and will benefit further skilled PT to maximize independence and decreased falls risk.   Follow Up Recommendations  Home health PT;Supervision for mobility/OOB     Equipment Recommendations  Rolling walker with 5" wheels    Recommendations for Other Services       Precautions / Restrictions Precautions Precautions: Fall Restrictions Weight Bearing Restrictions: No    Mobility  Bed Mobility Overal bed mobility: Needs Assistance Bed Mobility: Supine to Sit     Supine to sit: Min guard     General bed mobility comments: cues for technique; heavy use of rails and HOB flat  Transfers Overall transfer level: Needs assistance Equipment used: Rolling walker (2 wheeled) Transfers: Sit to/from Stand Sit to Stand: Mod assist         General transfer comment: assist to power up into standing with max cues for safe hand placement and use of AD; pt pulled up on RW and became agitated when given cues for safer technique; c/o dizziness upon standing but subsided quickly  Ambulation/Gait Ambulation/Gait  assistance: Min assist Ambulation Distance (Feet): 25 Feet Assistive device: Rolling walker (2 wheeled) Gait Pattern/deviations: Step-through pattern;Decreased stride length;Decreased step length - left;Drifts right/left (drifts left) Gait velocity: decreased   General Gait Details: pt continues to drift L with RW likely due to R UE weakness from previous CVA; assist to steady and manage RW as pt gets too far away and needs assistance fro safe use of AD; cues for posture, proximity of RW   Stairs         General stair comments: pt declined stair training  Wheelchair Mobility    Modified Rankin (Stroke Patients Only)       Balance Overall balance assessment: Needs assistance Sitting-balance support: No upper extremity supported Sitting balance-Leahy Scale: Fair     Standing balance support: Bilateral upper extremity supported Standing balance-Leahy Scale: Poor Standing balance comment: using rw for support                    Cognition Arousal/Alertness: Awake/alert Behavior During Therapy: WFL for tasks assessed/performed Overall Cognitive Status:  (likely baseline-family member present) Area of Impairment: Memory;Safety/judgement     Memory: Decreased short-term memory   Safety/Judgement: Decreased awareness of safety;Decreased awareness of deficits     General Comments: pt with decreaed awareness of deficits and appeared confused at times when discussing DME use and assistance at home    Exercises      General Comments        Pertinent Vitals/Pain Pain Assessment: No/denies pain    Home Living                      Prior Function  PT Goals (current goals can now be found in the care plan section) Acute Rehab PT Goals Patient Stated Goal: go home PT Goal Formulation: With patient Time For Goal Achievement: 10/04/16 Potential to Achieve Goals: Good Progress towards PT goals: Not progressing toward goals - comment     Frequency    Min 3X/week      PT Plan Current plan remains appropriate    Co-evaluation             End of Session Equipment Utilized During Treatment: Gait belt Activity Tolerance: Patient tolerated treatment well Patient left: in chair;with call bell/phone within reach;with family/visitor present     Time: [id number] PT Time Calculation (min) (ACUTE ONLY): 26 min  Charges:  $Gait Training: 8-22 mins $Therapeutic Activity: 8-22 mins                    G Codes:      Salina April, PTA Pager: [phone]   09/23/2016, 12:07 PM

## 2016-09-23 NOTE — Care Management Note (Signed)
Case Management Note  Patient Details  Name: Gregory [name] MRN: [id number] Date of Birth: [birth year]  Subjective/Objective:      83 yr old gentleman admitted with Cholilithiasis.              Action/Plan: case manager spoke with patient and his goddaughter, concerning discharge plan. Patient is adamant that he will not go to a SNF for shortterm rehab, he wants to go to his home. Case manager offered choice for [place]. Referral was called to Danny Lawless, [facility] Liaison. Family member states patient does not have rolling walker or 3in1. CM has ordered both items. She states someone will be coming to pickup patient.    Expected Discharge Date:  09/23/16               Expected Discharge Plan:  [place]  In-House Referral:  NA, Clinical Social Work  Discharge planning Services  CM Consult  Post Acute Care Choice:  Home Health Choice offered to:  Patient  DME Arranged:  Rolling walker, 3in1  DME Agency:   [facility]  HH Arranged:  PT, OT, Nurse's Aide, Social Work [employer] Agency:  [place]  Status of Service:  Completed, signed off  If discussed at [employer], dates discussed:    Additional Comments:  Ninfa Meeker, RN 09/23/2016, 10:38 AM

## 2016-09-23 NOTE — Discharge Summary (Signed)
PATIENT DETAILS Name: Gregory [name] Age: 83 y.o. Sex: male Date of Birth: May 18, 1930 MRN: [id number]. Admitting Physician: Gregory Falcon, MD [id number] S, MD  Admit Date: 09/17/2016 Discharge date: 09/23/2016  Recommendations for Outpatient Follow-up:  1. Follow up with PCP in 1-2 weeks 2. Please obtain BMP/CBC in one week 3. Please ensure follow-up with cardiology   Admitted From:  SNF  Disposition: Home with home health services (refused SNF)   Home Health: Yes  Equipment/Devices: None  Discharge Condition: Stable  CODE STATUS:  DNR/ Comfort Care  Diet recommendation:  Heart Healthy   Brief Summary: See H&P, Labs, Consult and Test reports for all details in brief,Patient is a 83 y.o. male history of non-STEMI August 2017 (no significant lesions on cardiac cath to undergo PCI), paroxysmal atrial fibrillation on anticoagulation, history of CVA in 2015 admitted with acute abdominal pain, found to probable gallstone pancreatitis probably due to a past gallstone and gram-negative bacteremia. See below for further details  Brief Hospital Course: Gallstone pancreatitis: Resolved, abdominal exam is benign- tolerating regular diet.MRCP negative for choledocholithiasis, GI does not recommend ERCP. Gen. surgery not advising to proceed with cholecystectomy given negative HIDA scan- and numerous other comorbidities in this elderly patient.  Klebsiella bacteremia: Likely secondary to passed gallbladder stone-managed with IV ceftriaxone, repeat blood cultures on 2/5 negative so far. Spoke with infectious disease M.D.-Dr. Lucianne Lei [name], okay to discharge on Augmentin. Will plan 10-14 days of antibiotic therapy from 2/5.  Paroxysmal atrial fibrillation: Rate controlled with amiodarone and metoprolol.Continue anticoagulation with apixaban. Patient recently was loaded with amiodarone, spoke with patient's primary cardiologist-Dr. Claiborne [name] over the phone, recommendations were to lower  amiodarone to 300 mg daily, and have her follow up with cardiology in the next few weeks for further tapering down off his dosage.  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 Continue medical management.  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  Acute kidney injury: Likely mild prerenal azotemia in a setting of bacteremia/pancreatitis, resolved.  Thrombocytopenia: Likely secondary to bacteremia/acute infection, platelet count slowly improving. Follow occasionally.  Influenza: Was on Tamiflu prior to this admission-stop date on 2/8.  Hypertension: Controlled-continue metoprolol.  Dyslipidemia: Continue statin  Anemia: Suspect secondary to acute illness-follow CBC periodically  Deep tissue injury to sacrum/moisture associated skin damage: Likely present prior to admission, wound care evaluation completed-continue recommendations per wound care  Severe malnutrition: Continue supplements  Generalized weakness/deconditioning: seen by physical therapy-adamantly refuses SNF-wants to go home. Spoke with patient's niece over the phone, family aware that they will need to provide more support.  Palliative care: Poor overall long-term prognosis given multiple comorbidities-palliative care consulted during this hospital stay, now a DO NOT RESUSCITATE.  Procedures/Studies: Echo 2/5 When compared to the prior study from 08/13/2016 there is improvement in pericardial effusion, now mild to moderate with maximum thickness 1.4 cm located posteriorly, previously 2 cm. No evidence of tamponade. Severe biventricular myocardial hypertrophy suspicious for cardiac amyloidosis.  Discharge Diagnoses:    Principal Problem:   Abdominal pain Active Problems:   Hypertension   Hyperlipidemia   PAF (paroxysmal atrial fibrillation) (HCC)   Chronic diastolic congestive heart failure (HCC)   Adult failure to thrive   Pressure injury of skin   Protein-calorie malnutrition, severe   DNR (do not resuscitate) discussion   Palliative  care by specialist   Bacteremia due to Gram-negative bacteria   Cholelithiases   Gallstone pancreatitis: Probable   Biliary colic: Probable   Abnormal LFTs   DNR (do not resuscitate)   Discharge Instructions:  Activity:  As tolerated with Full fall precautions use walker/cane & assistance as needed   Discharge Instructions    Call MD for:  persistant dizziness or light-headedness    Complete by:  As directed    Call MD for:  persistant nausea and vomiting    Complete by:  As directed    Change dressing    Complete by:  As directed    Dressing procedure/placement/frequency: Foam dressing to protect and promote healing. Discussed plan of care with patient and he verbalized understanding.   Diet - low sodium heart healthy    Complete by:  As directed    Discharge instructions    Complete by:  As directed    Follow with Primary MD  Gregory Spurling, MD  and your Primary Cardiologist in 2-3 weeks  Please see your cardiologist in 1-2 weeks, you will likely need the dose of Amiodarone reduced further  Please get a complete blood count and chemistry panel checked by your Primary MD at your next visit, and again as instructed by your Primary MD.  Get Medicines reviewed and adjusted: Please take all your medications with you for your next visit with your Primary MD  Laboratory/radiological data: Please request your Primary MD to go over all hospital tests and procedure/radiological results at the follow up, please ask your Primary MD to get all Hospital records sent to his/her office.  In some cases, they will be blood work, cultures and biopsy results  pending at the time of your discharge. Please request that your primary care M.D. follows up on these results.  Also Note the following: If you experience worsening of your admission symptoms, develop shortness of breath, life threatening emergency, suicidal or homicidal thoughts you must seek medical attention immediately by calling 911 or calling your MD immediately  if symptoms less severe.  You must read complete instructions/literature along with all the possible adverse reactions/side effects for all the Medicines you take and that have been prescribed to you. Take any new Medicines after you have completely understood and accpet all the possible adverse reactions/side effects.   Do not drive when taking Pain medications or sleeping medications (Benzodaizepines)  Do not take more than prescribed Pain, Sleep and Anxiety Medications. It is not advisable to combine anxiety,sleep and pain medications without talking with your primary care practitioner  Special Instructions: If you have smoked or chewed Tobacco  in the last 2 yrs please stop smoking, stop any regular Alcohol  and or any Recreational drug use.  Wear Seat belts while driving.  Please note: You were cared for by a hospitalist during your hospital stay. Once you are discharged, your primary care physician will handle any further medical issues. Please note that NO REFILLS for any discharge medications will be authorized once you are discharged, as it is imperative that you return to your primary care physician (or establish a relationship with a primary care physician if you do not have one) for your post hospital discharge needs so that they can reassess your need for medications and monitor your lab values.   Increase activity slowly    Complete by:  As directed      Allergies as of 09/23/2016      Reactions   Sulfa Antibiotics  Reaction not noted on MAR   Ramipril    Reaction not noted on MAR   Ramipril Other (See  Comments)   Reaction not noted on MAR   Sulfamethoxazole Rash      Medication List    STOP taking these medications   barrier cream Crea Commonly known as:  non-specified   bisacodyl 10 MG suppository Commonly known as:  DULCOLAX   dextromethorphan-guaiFENesin 30-600 MG 12hr tablet Commonly known as:  MUCINEX DM   magnesium hydroxide 400 MG/5ML suspension Commonly known as:  MILK OF MAGNESIA   NON FORMULARY   NON FORMULARY   oseltamivir 30 MG capsule Commonly known as:  TAMIFLU   oxyCODONE 5 MG immediate release tablet Commonly known as:  Oxy IR/ROXICODONE   polyethylene glycol packet Commonly known as:  MIRALAX / GLYCOLAX   RA SALINE ENEMA 19-7 GM/118ML Enem   senna 8.6 MG Tabs tablet Commonly known as:  SENOKOT   senna-docusate 8.6-50 MG tablet Commonly known as:  Senokot-S   traMADol 50 MG tablet Commonly known as:  ULTRAM     TAKE these medications   amiodarone 100 MG tablet Commonly known as:  PACERONE Take 3 tablets (300 mg total) by mouth daily. Please see your cardiologist in 1-2 weeks, you will likely need the dose of Amiodarone reduced further What changed:  medication strength  how much to take  additional instructions   amoxicillin-clavulanate 875-125 MG tablet Commonly known as:  AUGMENTIN Take 1 tablet by mouth every 12 (twelve) hours.   apixaban 5 MG Tabs tablet Commonly known as:  ELIQUIS Take 1 tablet (5 mg total) by mouth 2 (two) times daily.   atorvastatin 40 MG tablet Commonly known as:  LIPITOR Take 1 tablet (40 mg total) by mouth daily.   brimonidine 0.2 % ophthalmic solution Commonly known as:  ALPHAGAN Place 1 drop into both eyes at bedtime.   colchicine 0.6 MG tablet Take 1 tablet (0.6 mg total) by mouth daily.   feeding supplement (ENSURE ENLIVE) Liqd Take 237 mLs by mouth 2 (two) times daily between meals.   metoprolol 100 MG tablet Commonly known as:  LOPRESSOR Take 1 tablet (100 mg total) by mouth 2 (two)  times daily.   mirtazapine 15 MG disintegrating tablet Commonly known as:  REMERON SOL-TAB Take 0.5 tablets (7.5 mg total) by mouth at bedtime.   multivitamin with minerals Tabs tablet Take 1 tablet by mouth daily.   ondansetron 4 MG disintegrating tablet Commonly known as:  ZOFRAN-ODT Take 1 tablet (4 mg total) by mouth every 6 (six) hours as needed for nausea. What changed:  additional instructions   pantoprazole 40 MG tablet Commonly known as:  PROTONIX Take 1 tablet (40 mg total) by mouth 2 (two) times daily.   sertraline 50 MG tablet Commonly known as:  ZOLOFT Take 1 tablet (50 mg total) by mouth daily.      Follow-up Information    Gregory Spurling, MD. Schedule an appointment as soon as possible for a visit in 1 week(s).   Specialty:  Internal Medicine Contact information: [address] [place] [address] [phone]        Shelva Majestic, MD. Schedule an appointment as soon as possible for a visit in 2 week(s).   Specialty:  Cardiology Contact information: [address] [place] [address] [phone]          Allergies  Allergen Reactions  . Sulfa Antibiotics     Reaction not noted on  MAR  . Ramipril     Reaction not noted on MAR  . Ramipril Other (See Comments)    Reaction not noted on MAR  . Sulfamethoxazole Rash    Consultations:   GI and general surgery  Other Procedures/Studies: Mr 3d Recon At Scanner  Result Date: 09/19/2016 CLINICAL DATA:  83 year old male inpatient admitted with upper abdominal pain with elevated liver function tests and diagnosed with acute pancreatitis. Leukocytosis. Cholelithiasis on abdominal sonogram. Status post incarcerated left inguinal hernia repair in December 2017. EXAM: MRI ABDOMEN WITHOUT AND WITH CONTRAST (INCLUDING MRCP) TECHNIQUE: Multiplanar multisequence MR imaging of the abdomen was performed both before and after the administration of intravenous contrast. Heavily  T2-weighted images of the biliary and pancreatic ducts were obtained, and three-dimensional MRCP images were rendered by post processing. CONTRAST:  74mL MULTIHANCE GADOBENATE DIMEGLUMINE 529 MG/ML IV SOLN COMPARISON:  09/17/2016 right upper quadrant abdominal sonogram. 08/05/2016 CT abdomen/pelvis. FINDINGS: Significantly motion degraded scan. Lower chest: Cardiomegaly. Small to moderate pericardial effusion/thickening, decreased since 08/05/2016 CT study. Mild compressive atelectasis at the left lung base. Hepatobiliary: Normal liver size and configuration. No hepatic steatosis. There are several scattered tiny subcentimeter simple liver cysts throughout the liver. No suspicious liver masses. Several irregular foci of arterial phase hyperenhancement throughout the liver are occult on all other sequences and are favored to represent benign transient perfusional phenomena. There is gallbladder distention. There are a few layering gallstones measuring up to 10 mm in size. There is moderate sludge in the gallbladder. There is mild diffuse gallbladder wall thickening without pericholecystic fluid. No intrahepatic biliary ductal dilatation. Common bile duct diameter 6 mm, within normal limits for age. There is ill-defined intermediate signal intensity material throughout the common bile duct compatible with sludge. No discrete stones in the bile ducts. No biliary or ampullary mass. Pancreas: Normal size pancreas. Normal pancreatic parenchymal signal intensity. No peripancreatic fluid collections. No pancreatic mass or duct dilation. No no evidence of pancreas divisum. Spleen: Normal size. No mass. Adrenals/Urinary Tract: No discrete adrenal nodules. No hydronephrosis. Normal kidneys with no renal mass. Stomach/Bowel: Grossly normal stomach. Visualized small and large bowel is normal caliber, with no bowel wall thickening. Vascular/Lymphatic: Atherosclerotic nonaneurysmal abdominal aorta . Patent portal, splenic, hepatic  and renal veins. No pathologically enlarged lymph nodes in the abdomen. Other: No abdominal ascites or focal fluid collection. Musculoskeletal: No aggressive appearing focal osseous lesions. IMPRESSION: 1. Cholelithiasis. Moderate gallbladder sludge. Diffuse gallbladder wall thickening. Gallbladder distention. No pericholecystic fluid. MRI findings are equivocal for acute cholecystitis and correlation with clinical exam, right upper quadrant abdominal sonogram and/ or hepatobiliary scintigraphy can be performed as clinically warranted. 2. No biliary ductal dilatation. Scattered sludge throughout the common bile duct without appreciable choledocholithiasis. No biliary mass or stricture. 3. No MRI findings of acute pancreatitis. No peripancreatic fluid collections. No pancreatic duct dilation. 4. Cardiomegaly. Small to moderate pericardial effusion/thickening, decreased since 08/05/2016 CT study. 5. Aortic atherosclerosis. Electronically Signed   By: Ilona Sorrel M.D.   On: 09/19/2016 15:07   Nm Hepato W/eject Fract  Result Date: 09/21/2016 CLINICAL DATA:  Upper abdominal pain and vomiting with known cholelithiasis EXAM: NUCLEAR MEDICINE HEPATOBILIARY IMAGING TECHNIQUE: Sequential images of the abdomen were obtained out to 60 minutes following intravenous administration of radiopharmaceutical. RADIOPHARMACEUTICALS:  8.4 mCi Tc-67m  Choletec IV COMPARISON:  None. FINDINGS: Prompt uptake and excretion of biliary tracer is noted. Biliary activity passes into small bowel, consistent with patent common bile duct. The gallbladder was not visualized at 90 minutes.  Subsequent 2.4 mg of morphine was administered intravenously and all bladder visualization was noted. The gallbladder distends in a normal fashion. IMPRESSION: Normal uptake and excretion of biliary tracer. Gallbladder visualization is noted following morphine administration consistent with a patent cystic duct. Electronically Signed   By: Inez Catalina M.D.    On: 09/21/2016 12:58   US Abdomen Limited  Result Date: 09/17/2016 CLINICAL DATA:  Initial valuation for acute abdominal pain. EXAM: US ABDOMEN LIMITED - RIGHT UPPER QUADRANT COMPARISON:  Prior CT from 08/15/2016. FINDINGS: Gallbladder: Few echogenic stones present within the gallbladder lumen, largest of which measured 1.5 cm. Associated gallbladder sludge. Gallbladder wall measured within normal limits at 2.8 mm. No free pericholecystic fluid. No sonographic Murphy sign elicited on exam. Common bile duct: Diameter: 6.6 mm Liver: No focal lesion identified. Within normal limits in parenchymal echogenicity. Left hepatic lobe not well visualized. IMPRESSION: Cholelithiasis with gallbladder sludge. No other sonographic features to suggest acute cholecystitis. No biliary dilatation. Electronically Signed   By: Jeannine Boga M.D.   On: 09/17/2016 21:09   Dg Abd 2 Views  Result Date: 09/18/2016 CLINICAL DATA:  83 year old male with generalized abdominal pain and constipation. Personal history of gastrointestinal bleed. Initial encounter. EXAM: ABDOMEN - 2 VIEW COMPARISON:  0952 hours today and earlier. FINDINGS: Upright and supine views of the abdomen and pelvis no pneumoperitoneum. Non obstructed bowel gas pattern. Cholelithiasis again noted. Extensive pelvic surgical clips again noted. Stable visualized osseous structures. Negative visible lung bases. Iliofemoral calcified atherosclerosis. IMPRESSION: Normal bowel gas pattern, no free air. Cholelithiasis. Electronically Signed   By: Genevie Ann M.D.   On: 09/18/2016 15:24   Dg Abd Portable 1v  Result Date: 09/18/2016 CLINICAL DATA:  83 year old male with chest and abdominal pain since yesterday. Initial encounter. EXAM: PORTABLE ABDOMEN - 1 VIEW COMPARISON:  Right upper quadrant ultrasound 09/17/2016. CT Abdomen and Pelvis 10/07/2015 FINDINGS: Portable AP supine view at 0952 hours. Non obstructed bowel gas pattern. Cholelithiasis again noted. Stable  abdominal and pelvic visceral contours. Stable osseous degenerative changes. Extensive pelvic sidewall surgical clips again noted. IMPRESSION: Normal bowel gas pattern.  Cholelithiasis. Electronically Signed   By: Genevie Ann M.D.   On: 09/18/2016 10:02   Mr Abdomen Mrcp Moise Boring Contast  Result Date: 09/19/2016 CLINICAL DATA:  83 year old male inpatient admitted with upper abdominal pain with elevated liver function tests and diagnosed with acute pancreatitis. Leukocytosis. Cholelithiasis on abdominal sonogram. Status post incarcerated left inguinal hernia repair in December 2017. EXAM: MRI ABDOMEN WITHOUT AND WITH CONTRAST (INCLUDING MRCP) TECHNIQUE: Multiplanar multisequence MR imaging of the abdomen was performed both before and after the administration of intravenous contrast. Heavily T2-weighted images of the biliary and pancreatic ducts were obtained, and three-dimensional MRCP images were rendered by post processing. CONTRAST:  51mL MULTIHANCE GADOBENATE DIMEGLUMINE 529 MG/ML IV SOLN COMPARISON:  09/17/2016 right upper quadrant abdominal sonogram. 08/05/2016 CT abdomen/pelvis. FINDINGS: Significantly motion degraded scan. Lower chest: Cardiomegaly. Small to moderate pericardial effusion/thickening, decreased since 08/05/2016 CT study. Mild compressive atelectasis at the left lung base. Hepatobiliary: Normal liver size and configuration. No hepatic steatosis. There are several scattered tiny subcentimeter simple liver cysts throughout the liver. No suspicious liver masses. Several irregular foci of arterial phase hyperenhancement throughout the liver are occult on all other sequences and are favored to represent benign transient perfusional phenomena. There is gallbladder distention. There are a few layering gallstones measuring up to 10 mm in size. There is moderate sludge in the gallbladder. There is mild diffuse gallbladder  wall thickening without pericholecystic fluid. No intrahepatic biliary ductal  dilatation. Common bile duct diameter 6 mm, within normal limits for age. There is ill-defined intermediate signal intensity material throughout the common bile duct compatible with sludge. No discrete stones in the bile ducts. No biliary or ampullary mass. Pancreas: Normal size pancreas. Normal pancreatic parenchymal signal intensity. No peripancreatic fluid collections. No pancreatic mass or duct dilation. No no evidence of pancreas divisum. Spleen: Normal size. No mass. Adrenals/Urinary Tract: No discrete adrenal nodules. No hydronephrosis. Normal kidneys with no renal mass. Stomach/Bowel: Grossly normal stomach. Visualized small and large bowel is normal caliber, with no bowel wall thickening. Vascular/Lymphatic: Atherosclerotic nonaneurysmal abdominal aorta . Patent portal, splenic, hepatic and renal veins. No pathologically enlarged lymph nodes in the abdomen. Other: No abdominal ascites or focal fluid collection. Musculoskeletal: No aggressive appearing focal osseous lesions. IMPRESSION: 1. Cholelithiasis. Moderate gallbladder sludge. Diffuse gallbladder wall thickening. Gallbladder distention. No pericholecystic fluid. MRI findings are equivocal for acute cholecystitis and correlation with clinical exam, right upper quadrant abdominal sonogram and/ or hepatobiliary scintigraphy can be performed as clinically warranted. 2. No biliary ductal dilatation. Scattered sludge throughout the common bile duct without appreciable choledocholithiasis. No biliary mass or stricture. 3. No MRI findings of acute pancreatitis. No peripancreatic fluid collections. No pancreatic duct dilation. 4. Cardiomegaly. Small to moderate pericardial effusion/thickening, decreased since 08/05/2016 CT study. 5. Aortic atherosclerosis. Electronically Signed   By: Ilona Sorrel M.D.   On: 09/19/2016 15:07     TODAY-DAY OF DISCHARGE:  Subjective:   Gregory [name] today has no headache,no chest abdominal pain,no new weakness tingling or  numbness, feels much better wants to go home today.   Objective:   Blood pressure (!) 141/62, pulse (!) 57, temperature 98 F (36.7 C), temperature source Oral, resp. rate 16, weight 59.2 kg (130 lb 8.2 oz), SpO2 100 %.  Intake/Output Summary (Last 24 hours) at 09/23/16 0912 Last data filed at 09/23/16 0647  Gross per 24 hour  Intake              120 ml  Output             1150 ml  Net            -1030 ml   Filed Weights   09/18/16 0049  Weight: 59.2 kg (130 lb 8.2 oz)    Exam: Awake Alert, Oriented *3, No new F.N deficits, Normal affect [place].AT,PERRAL Supple Neck,No JVD, No cervical lymphadenopathy appriciated.  Symmetrical Chest wall movement, Good air movement bilaterally, CTAB RRR,No Gallops,Rubs or new Murmurs, No Parasternal Heave +ve B.Sounds, Abd Soft, Non tender, No organomegaly appriciated, No rebound -guarding or rigidity. No Cyanosis, Clubbing or edema, No new Rash or bruise   PERTINENT RADIOLOGIC STUDIES: Mr 3d Recon At Scanner  Result Date: 09/19/2016 CLINICAL DATA:  83 year old male inpatient admitted with upper abdominal pain with elevated liver function tests and diagnosed with acute pancreatitis. Leukocytosis. Cholelithiasis on abdominal sonogram. Status post incarcerated left inguinal hernia repair in December 2017. EXAM: MRI ABDOMEN WITHOUT AND WITH CONTRAST (INCLUDING MRCP) TECHNIQUE: Multiplanar multisequence MR imaging of the abdomen was performed both before and after the administration of intravenous contrast. Heavily T2-weighted images of the biliary and pancreatic ducts were obtained, and three-dimensional MRCP images were rendered by post processing. CONTRAST:  68mL MULTIHANCE GADOBENATE DIMEGLUMINE 529 MG/ML IV SOLN COMPARISON:  09/17/2016 right upper quadrant abdominal sonogram. 08/05/2016 CT abdomen/pelvis. FINDINGS: Significantly motion degraded scan. Lower chest: Cardiomegaly. Small to moderate pericardial effusion/thickening, decreased  since 08/05/2016  CT study. Mild compressive atelectasis at the left lung base. Hepatobiliary: Normal liver size and configuration. No hepatic steatosis. There are several scattered tiny subcentimeter simple liver cysts throughout the liver. No suspicious liver masses. Several irregular foci of arterial phase hyperenhancement throughout the liver are occult on all other sequences and are favored to represent benign transient perfusional phenomena. There is gallbladder distention. There are a few layering gallstones measuring up to 10 mm in size. There is moderate sludge in the gallbladder. There is mild diffuse gallbladder wall thickening without pericholecystic fluid. No intrahepatic biliary ductal dilatation. Common bile duct diameter 6 mm, within normal limits for age. There is ill-defined intermediate signal intensity material throughout the common bile duct compatible with sludge. No discrete stones in the bile ducts. No biliary or ampullary mass. Pancreas: Normal size pancreas. Normal pancreatic parenchymal signal intensity. No peripancreatic fluid collections. No pancreatic mass or duct dilation. No no evidence of pancreas divisum. Spleen: Normal size. No mass. Adrenals/Urinary Tract: No discrete adrenal nodules. No hydronephrosis. Normal kidneys with no renal mass. Stomach/Bowel: Grossly normal stomach. Visualized small and large bowel is normal caliber, with no bowel wall thickening. Vascular/Lymphatic: Atherosclerotic nonaneurysmal abdominal aorta . Patent portal, splenic, hepatic and renal veins. No pathologically enlarged lymph nodes in the abdomen. Other: No abdominal ascites or focal fluid collection. Musculoskeletal: No aggressive appearing focal osseous lesions. IMPRESSION: 1. Cholelithiasis. Moderate gallbladder sludge. Diffuse gallbladder wall thickening. Gallbladder distention. No pericholecystic fluid. MRI findings are equivocal for acute cholecystitis and correlation with clinical exam, right upper quadrant  abdominal sonogram and/ or hepatobiliary scintigraphy can be performed as clinically warranted. 2. No biliary ductal dilatation. Scattered sludge throughout the common bile duct without appreciable choledocholithiasis. No biliary mass or stricture. 3. No MRI findings of acute pancreatitis. No peripancreatic fluid collections. No pancreatic duct dilation. 4. Cardiomegaly. Small to moderate pericardial effusion/thickening, decreased since 08/05/2016 CT study. 5. Aortic atherosclerosis. Electronically Signed   By: Ilona Sorrel M.D.   On: 09/19/2016 15:07   Nm Hepato W/eject Fract  Result Date: 09/21/2016 CLINICAL DATA:  Upper abdominal pain and vomiting with known cholelithiasis EXAM: NUCLEAR MEDICINE HEPATOBILIARY IMAGING TECHNIQUE: Sequential images of the abdomen were obtained out to 60 minutes following intravenous administration of radiopharmaceutical. RADIOPHARMACEUTICALS:  8.4 mCi Tc-27m  Choletec IV COMPARISON:  None. FINDINGS: Prompt uptake and excretion of biliary tracer is noted. Biliary activity passes into small bowel, consistent with patent common bile duct. The gallbladder was not visualized at 90 minutes. Subsequent 2.4 mg of morphine was administered intravenously and all bladder visualization was noted. The gallbladder distends in a normal fashion. IMPRESSION: Normal uptake and excretion of biliary tracer. Gallbladder visualization is noted following morphine administration consistent with a patent cystic duct. Electronically Signed   By: Inez Catalina M.D.   On: 09/21/2016 12:58   US Abdomen Limited  Result Date: 09/17/2016 CLINICAL DATA:  Initial valuation for acute abdominal pain. EXAM: US ABDOMEN LIMITED - RIGHT UPPER QUADRANT COMPARISON:  Prior CT from 08/15/2016. FINDINGS: Gallbladder: Few echogenic stones present within the gallbladder lumen, largest of which measured 1.5 cm. Associated gallbladder sludge. Gallbladder wall measured within normal limits at 2.8 mm. No free pericholecystic  fluid. No sonographic Murphy sign elicited on exam. Common bile duct: Diameter: 6.6 mm Liver: No focal lesion identified. Within normal limits in parenchymal echogenicity. Left hepatic lobe not well visualized. IMPRESSION: Cholelithiasis with gallbladder sludge. No other sonographic features to suggest acute cholecystitis. No biliary dilatation. Electronically Signed   By:  Jeannine Boga M.D.   On: 09/17/2016 21:09   Dg Abd 2 Views  Result Date: 09/18/2016 CLINICAL DATA:  83 year old male with generalized abdominal pain and constipation. Personal history of gastrointestinal bleed. Initial encounter. EXAM: ABDOMEN - 2 VIEW COMPARISON:  0952 hours today and earlier. FINDINGS: Upright and supine views of the abdomen and pelvis no pneumoperitoneum. Non obstructed bowel gas pattern. Cholelithiasis again noted. Extensive pelvic surgical clips again noted. Stable visualized osseous structures. Negative visible lung bases. Iliofemoral calcified atherosclerosis. IMPRESSION: Normal bowel gas pattern, no free air. Cholelithiasis. Electronically Signed   By: Genevie Ann M.D.   On: 09/18/2016 15:24   Dg Abd Portable 1v  Result Date: 09/18/2016 CLINICAL DATA:  83 year old male with chest and abdominal pain since yesterday. Initial encounter. EXAM: PORTABLE ABDOMEN - 1 VIEW COMPARISON:  Right upper quadrant ultrasound 09/17/2016. CT Abdomen and Pelvis 10/07/2015 FINDINGS: Portable AP supine view at 0952 hours. Non obstructed bowel gas pattern. Cholelithiasis again noted. Stable abdominal and pelvic visceral contours. Stable osseous degenerative changes. Extensive pelvic sidewall surgical clips again noted. IMPRESSION: Normal bowel gas pattern.  Cholelithiasis. Electronically Signed   By: Genevie Ann M.D.   On: 09/18/2016 10:02   Mr Abdomen Mrcp Moise Boring Contast  Result Date: 09/19/2016 CLINICAL DATA:  83 year old male inpatient admitted with upper abdominal pain with elevated liver function tests and diagnosed with acute  pancreatitis. Leukocytosis. Cholelithiasis on abdominal sonogram. Status post incarcerated left inguinal hernia repair in December 2017. EXAM: MRI ABDOMEN WITHOUT AND WITH CONTRAST (INCLUDING MRCP) TECHNIQUE: Multiplanar multisequence MR imaging of the abdomen was performed both before and after the administration of intravenous contrast. Heavily T2-weighted images of the biliary and pancreatic ducts were obtained, and three-dimensional MRCP images were rendered by post processing. CONTRAST:  76mL MULTIHANCE GADOBENATE DIMEGLUMINE 529 MG/ML IV SOLN COMPARISON:  09/17/2016 right upper quadrant abdominal sonogram. 08/05/2016 CT abdomen/pelvis. FINDINGS: Significantly motion degraded scan. Lower chest: Cardiomegaly. Small to moderate pericardial effusion/thickening, decreased since 08/05/2016 CT study. Mild compressive atelectasis at the left lung base. Hepatobiliary: Normal liver size and configuration. No hepatic steatosis. There are several scattered tiny subcentimeter simple liver cysts throughout the liver. No suspicious liver masses. Several irregular foci of arterial phase hyperenhancement throughout the liver are occult on all other sequences and are favored to represent benign transient perfusional phenomena. There is gallbladder distention. There are a few layering gallstones measuring up to 10 mm in size. There is moderate sludge in the gallbladder. There is mild diffuse gallbladder wall thickening without pericholecystic fluid. No intrahepatic biliary ductal dilatation. Common bile duct diameter 6 mm, within normal limits for age. There is ill-defined intermediate signal intensity material throughout the common bile duct compatible with sludge. No discrete stones in the bile ducts. No biliary or ampullary mass. Pancreas: Normal size pancreas. Normal pancreatic parenchymal signal intensity. No peripancreatic fluid collections. No pancreatic mass or duct dilation. No no evidence of pancreas divisum. Spleen:  Normal size. No mass. Adrenals/Urinary Tract: No discrete adrenal nodules. No hydronephrosis. Normal kidneys with no renal mass. Stomach/Bowel: Grossly normal stomach. Visualized small and large bowel is normal caliber, with no bowel wall thickening. Vascular/Lymphatic: Atherosclerotic nonaneurysmal abdominal aorta . Patent portal, splenic, hepatic and renal veins. No pathologically enlarged lymph nodes in the abdomen. Other: No abdominal ascites or focal fluid collection. Musculoskeletal: No aggressive appearing focal osseous lesions. IMPRESSION: 1. Cholelithiasis. Moderate gallbladder sludge. Diffuse gallbladder wall thickening. Gallbladder distention. No pericholecystic fluid. MRI findings are equivocal for acute cholecystitis and correlation with clinical  exam, right upper quadrant abdominal sonogram and/ or hepatobiliary scintigraphy can be performed as clinically warranted. 2. No biliary ductal dilatation. Scattered sludge throughout the common bile duct without appreciable choledocholithiasis. No biliary mass or stricture. 3. No MRI findings of acute pancreatitis. No peripancreatic fluid collections. No pancreatic duct dilation. 4. Cardiomegaly. Small to moderate pericardial effusion/thickening, decreased since 08/05/2016 CT study. 5. Aortic atherosclerosis. Electronically Signed   By: Ilona Sorrel M.D.   On: 09/19/2016 15:07     PERTINENT LAB RESULTS: CBC: No results for input(s): WBC, HGB, HCT, PLT in the last 72 hours. CMET CMP     Component Value Date/Time   NA 138 09/22/2016 0746   NA 140 08/25/2016   K 3.7 09/22/2016 0746   CL 107 09/22/2016 0746   CO2 20 (L) 09/22/2016 0746   GLUCOSE 72 09/22/2016 0746   BUN 5 (L) 09/22/2016 0746   BUN 9 08/25/2016   CREATININE 0.74 09/22/2016 0746   CREATININE 0.67 (L) 105/20/2017 0825   CALCIUM 7.9 (L) 09/22/2016 0746   PROT 4.6 (L) 09/22/2016 0746   ALBUMIN 1.9 (L) 09/22/2016 0746   AST 33 09/22/2016 0746   ALT 52 09/22/2016 0746   ALKPHOS  121 09/22/2016 0746   BILITOT 1.2 09/22/2016 0746   GFRNONAA >60 09/22/2016 0746   GFRAA >60 09/22/2016 0746    GFR Estimated Creatinine Clearance: 57.6 mL/min (by C-G formula based on SCr of 0.74 mg/dL). No results for input(s): LIPASE, AMYLASE in the last 72 hours. No results for input(s): CKTOTAL, CKMB, CKMBINDEX, TROPONINI in the last 72 hours. Invalid input(s): POCBNP No results for input(s): DDIMER in the last 72 hours. No results for input(s): HGBA1C in the last 72 hours. No results for input(s): CHOL, HDL, LDLCALC, TRIG, CHOLHDL, LDLDIRECT in the last 72 hours. No results for input(s): TSH, T4TOTAL, T3FREE, THYROIDAB in the last 72 hours.  Invalid input(s): FREET3 No results for input(s): VITAMINB12, FOLATE, FERRITIN, TIBC, IRON, RETICCTPCT in the last 72 hours. Coags: No results for input(s): INR in the last 72 hours.  Invalid input(s): PT Microbiology: Recent Results (from the past 240 hour(s))  Surgical pcr screen     Status: Abnormal   Collection Time: 09/18/16  1:02 AM  Result Value Ref Range Status   MRSA, PCR NEGATIVE NEGATIVE Final   Staphylococcus aureus POSITIVE (A) NEGATIVE Final    Comment:        The Xpert SA Assay (FDA approved for NASAL specimens in patients over 44 years of age), is one component of a comprehensive surveillance program.  Test performance has been validated by [facility] for patients greater than or equal to 55 year old. It is not intended to diagnose infection nor to guide or monitor treatment.   Culture, blood (routine x 2)     Status: Abnormal   Collection Time: 09/18/16  8:25 PM  Result Value Ref Range Status   Specimen Description BLOOD LEFT ARM  Final   Special Requests BOTTLES DRAWN AEROBIC ONLY  8CC  Final   Culture  Setup Time   Final    GRAM NEGATIVE RODS AEROBIC BOTTLE ONLY CRITICAL RESULT CALLED TO, READ BACK BY AND VERIFIED WITH: E. MARTIN, PHARM, 09/19/16 AT 1428 BY J FUDESCO    Culture (A)  Final    KLEBSIELLA  OXYTOCA SUSCEPTIBILITIES PERFORMED ON PREVIOUS CULTURE WITHIN THE LAST 5 DAYS.    Report Status 09/21/2016 FINAL  Final  Blood Culture ID Panel (Reflexed)     Status: Abnormal  Collection Time: 09/18/16  8:25 PM  Result Value Ref Range Status   Enterococcus species NOT DETECTED NOT DETECTED Final   Listeria monocytogenes NOT DETECTED NOT DETECTED Final   Staphylococcus species NOT DETECTED NOT DETECTED Final   Staphylococcus aureus NOT DETECTED NOT DETECTED Final   Streptococcus species NOT DETECTED NOT DETECTED Final   Streptococcus agalactiae NOT DETECTED NOT DETECTED Final   Streptococcus pneumoniae NOT DETECTED NOT DETECTED Final   Streptococcus pyogenes NOT DETECTED NOT DETECTED Final   Acinetobacter baumannii NOT DETECTED NOT DETECTED Final   Enterobacteriaceae species DETECTED (A) NOT DETECTED Final    Comment: Enterobacteriaceae represent a large family of gram-negative bacteria, not a single organism. CRITICAL RESULT CALLED TO, READ BACK BY AND VERIFIED WITH: E. MARTIN, 09/19/16 AT 1428 BY J FUDESCO    Enterobacter cloacae complex NOT DETECTED NOT DETECTED Final   Escherichia coli NOT DETECTED NOT DETECTED Final   Klebsiella oxytoca DETECTED (A) NOT DETECTED Final    Comment: CRITICAL RESULT CALLED TO, READ BACK BY AND VERIFIED WITH: E. MARTIN, 09/19/16 AT 1428 BY J FUDESCO    Klebsiella pneumoniae NOT DETECTED NOT DETECTED Final   Proteus species NOT DETECTED NOT DETECTED Final   Serratia marcescens NOT DETECTED NOT DETECTED Final   Carbapenem resistance NOT DETECTED NOT DETECTED Final   Haemophilus influenzae NOT DETECTED NOT DETECTED Final   Neisseria meningitidis NOT DETECTED NOT DETECTED Final   Pseudomonas aeruginosa NOT DETECTED NOT DETECTED Final   Candida albicans NOT DETECTED NOT DETECTED Final   Candida glabrata NOT DETECTED NOT DETECTED Final   Candida krusei NOT DETECTED NOT DETECTED Final   Candida parapsilosis NOT DETECTED NOT DETECTED Final   Candida  tropicalis NOT DETECTED NOT DETECTED Final  Culture, blood (routine x 2)     Status: Abnormal   Collection Time: 09/18/16  8:34 PM  Result Value Ref Range Status   Specimen Description BLOOD RIGHT HAND  Final   Special Requests IN PEDIATRIC BOTTLE  3CC  Final   Culture  Setup Time   Final    GRAM NEGATIVE RODS IN PEDIATRIC BOTTLE CRITICAL RESULT CALLED TO, READ BACK BY AND VERIFIED WITH: EHassell Done, PHARM, 09/19/16 AT 1428 BY J FUDESCO    Culture KLEBSIELLA OXYTOCA (A)  Final   Report Status 09/21/2016 FINAL  Final   Organism ID, Bacteria KLEBSIELLA OXYTOCA  Final      Susceptibility   Klebsiella oxytoca - MIC*    AMPICILLIN RESISTANT Resistant     CEFAZOLIN <=4 SENSITIVE Sensitive     CEFEPIME <=1 SENSITIVE Sensitive     CEFTAZIDIME <=1 SENSITIVE Sensitive     CEFTRIAXONE <=1 SENSITIVE Sensitive     CIPROFLOXACIN <=0.25 SENSITIVE Sensitive     GENTAMICIN <=1 SENSITIVE Sensitive     IMIPENEM <=0.25 SENSITIVE Sensitive     TRIMETH/SULFA <=20 SENSITIVE Sensitive     AMPICILLIN/SULBACTAM 4 SENSITIVE Sensitive     PIP/TAZO <=4 SENSITIVE Sensitive     Extended ESBL NEGATIVE Sensitive     * KLEBSIELLA OXYTOCA  Blood Culture ID Panel (Reflexed)     Status: Abnormal   Collection Time: 09/18/16  8:34 PM  Result Value Ref Range Status   Enterococcus species NOT DETECTED NOT DETECTED Final   Listeria monocytogenes NOT DETECTED NOT DETECTED Final   Staphylococcus species NOT DETECTED NOT DETECTED Final   Staphylococcus aureus NOT DETECTED NOT DETECTED Final   Streptococcus species NOT DETECTED NOT DETECTED Final   Streptococcus agalactiae NOT DETECTED NOT DETECTED Final  Streptococcus pneumoniae NOT DETECTED NOT DETECTED Final   Streptococcus pyogenes NOT DETECTED NOT DETECTED Final   Acinetobacter baumannii NOT DETECTED NOT DETECTED Final   Enterobacteriaceae species DETECTED (A) NOT DETECTED Final    Comment: Enterobacteriaceae represent a large family of gram-negative bacteria, not a  single organism. CRITICAL RESULT CALLED TO, READ BACK BY AND VERIFIED WITH: E. MARTIN, PHARM, 09/19/16 AT 1428 BY J FUDESCO    Enterobacter cloacae complex NOT DETECTED NOT DETECTED Final   Escherichia coli NOT DETECTED NOT DETECTED Final   Klebsiella oxytoca DETECTED (A) NOT DETECTED Final    Comment: CRITICAL RESULT CALLED TO, READ BACK BY AND VERIFIED WITH: E. MARTIN , PHARM, 09/19/16 AT 1428 BY J FUDESCO    Klebsiella pneumoniae NOT DETECTED NOT DETECTED Final   Proteus species NOT DETECTED NOT DETECTED Final   Serratia marcescens NOT DETECTED NOT DETECTED Final   Carbapenem resistance NOT DETECTED NOT DETECTED Final   Haemophilus influenzae NOT DETECTED NOT DETECTED Final   Neisseria meningitidis NOT DETECTED NOT DETECTED Final   Pseudomonas aeruginosa NOT DETECTED NOT DETECTED Final   Candida albicans NOT DETECTED NOT DETECTED Final   Candida glabrata NOT DETECTED NOT DETECTED Final   Candida krusei NOT DETECTED NOT DETECTED Final   Candida parapsilosis NOT DETECTED NOT DETECTED Final   Candida tropicalis NOT DETECTED NOT DETECTED Final  Culture, blood (routine x 2)     Status: None (Preliminary result)   Collection Time: 09/20/16  3:19 PM  Result Value Ref Range Status   Specimen Description BLOOD LEFT ANTECUBITAL  Final   Special Requests BOTTLES DRAWN AEROBIC ONLY  5 CC  Final   Culture NO GROWTH 2 DAYS  Final   Report Status PENDING  Incomplete  Culture, blood (Routine X 2) w Reflex to ID Panel     Status: None (Preliminary result)   Collection Time: 09/20/16  4:21 PM  Result Value Ref Range Status   Specimen Description BLOOD RIGHT HAND  Final   Special Requests IN PEDIATRIC BOTTLE 3CC  Final   Culture NO GROWTH 2 DAYS  Final   Report Status PENDING  Incomplete    FURTHER DISCHARGE INSTRUCTIONS:  Get Medicines reviewed and adjusted: Please take all your medications with you for your next visit with your Primary MD  Laboratory/radiological data: Please request your  Primary MD to go over all hospital tests and procedure/radiological results at the follow up, please ask your Primary MD to get all Hospital records sent to his/her office.  In some cases, they will be blood work, cultures and biopsy results pending at the time of your discharge. Please request that your primary care M.D. goes through all the records of your hospital data and follows up on these results.  Also Note the following: If you experience worsening of your admission symptoms, develop shortness of breath, life threatening emergency, suicidal or homicidal thoughts you must seek medical attention immediately by calling 911 or calling your MD immediately  if symptoms less severe.  You must read complete instructions/literature along with all the possible adverse reactions/side effects for all the Medicines you take and that have been prescribed to you. Take any new Medicines after you have completely understood and accpet all the possible adverse reactions/side effects.   Do not drive when taking Pain medications or sleeping medications (Benzodaizepines)  Do not take more than prescribed Pain, Sleep and Anxiety Medications. It is not advisable to combine anxiety,sleep and pain medications without talking with your primary care practitioner  Special Instructions: If you have smoked or chewed Tobacco  in the last 2 yrs please stop smoking, stop any regular Alcohol  and or any Recreational drug use.  Wear Seat belts while driving.  Please note: You were cared for by a hospitalist during your hospital stay. Once you are discharged, your primary care physician will handle any further medical issues. Please note that NO REFILLS for any discharge medications will be authorized once you are discharged, as it is imperative that you return to your primary care physician (or establish a relationship with a primary care physician if you do not have one) for your post hospital discharge needs so that they  can reassess your need for medications and monitor your lab values.  Total Time spent coordinating discharge including counseling, education and face to face time equals  45 minutes.  SignedOren Binet 09/23/2016 9:12 AM

## 2016-09-25 LAB — CULTURE, BLOOD (ROUTINE X 2)
Culture: NO GROWTH
Culture: NO GROWTH

## 2016-09-27 ENCOUNTER — Telehealth: Payer: Self-pay | Admitting: Cardiovascular Disease

## 2016-09-27 NOTE — Telephone Encounter (Addendum)
Returned call to [place] at [facility]. [name] sign orders for home RN as well as home PT.  They cannot get orders signed by PCP due to him not being "[employer]"? Certified.    Advised I would route to MD for approval.    Also patient d/c from hospital 2/8-needs 2 week f/u.    Appointment scheduled with Bernerd Pho PA for 2/22 at [facility] at [facility] office.    TOC call made-patient aware of appt.

## 2016-09-27 NOTE — Telephone Encounter (Signed)
Patient contacted regarding discharge from [facility] on 09/23/16.    Patient understands to follow up with provider Bernerd Pho PA on 10/07/16 at 2:30 PM at [facility].  Patient understands discharge instructions? yes  Patient understands medications and regiment? yes  Patient understands to bring all medications to this visit? yes

## 2016-09-27 NOTE — Telephone Encounter (Signed)
New message     Will Dr [name] sign orders for pt and follow pt for therapy , pt PCP is not [employer] certified

## 2016-09-30 NOTE — Telephone Encounter (Signed)
OK for home RN and PT

## 2016-10-05 NOTE — Telephone Encounter (Signed)
Returned call to [place] with [facility]-advised Dr [name] approved [facility] RN and PT.

## 2016-10-06 NOTE — Progress Notes (Signed)
Cardiology Office Note    Date:  10/07/2016   ID:  Ze, Colaluca 1929/08/24, MRN [id number]  PCP:  [name], MD  Cardiologist: Dr. [name]   Chief Complaint  Patient presents with  . Follow-up    Followup; discuss appetitie, weakness in legs     History of Present Illness:    Gregory [name] is a 83 y.o. male with past medical history of CAD (s/p STEMI in 03/2016 w/ 50% dLM stenosis, 75% ost Cx stenosis, 30% mid Cx, 50% Mrg, 30% prox RCA, 60-70% dRCA stenosis, and 50% dPDA stenosis - medical therapy recommended), prior nonhemorrhagic CVA, chronic diastolic CHF, paroxysmal atrial fibrillation (on Eliquis), HTN, and HLD who presents to the office today for hospital follow-up.   Was admitted from 08/05/2016 - 08/22/2016 for worsening lower abdominal pain found to be secondary to an incarcerated inguinal hernia. He underwent laparoscopic repair with small bowel resection on 12/21. Post-operatively, he was found to have a pericardial effusion with tamponade and underwent pericardiocentesis on 12/22 with drain removal on 12/24. Also had atrial fibrillation with RVR during admission and was started on Amiodarone and continued on high-dose Lopressor.   He was readmitted on 09/17/2016 -09/23/2016 for evaluation of chest and abdominal discomfort. Troponin values flat at 0.05. Found to have gallstone pancreatitis and GI did not recommend ERCP and Surgery advised against invasive options  Also diagnosed with bacteremia as blood cultures were positive for Klebsiella oxytoca. Palliative Care was consulted during admission to discuss goals of care and he was made DNR.   He reports feeling weak since his recent hospitalization, mostly noting weakness along his lower extremities. Denies any recent falls. Has been residing at home and just started working with [facility] earlier this week. Daughter reports they are planning to tour Assisted Living Facilities and he is open to this.   He  denies any recent chest discomfort, palpitations, dyspnea with exertion, orthopnea, PND, or abdominal pain. Does note worsening lower extremity edema over the past several weeks. Has a decreased appetite with associated weight loss (173 lbs in 07/2016 down to 139 lbs at today's visit).    Past Medical History:  Diagnosis Date  . Arthritis    "legs, knees" (08/05/2016)  . CAD (coronary artery disease)    a. cath 03/2016: diffuse disease w/ 50% ostial LM stenosis, 75% ostial Cx, 50% 3rd Mrg, 65% distal RCA, and 50% RPDA. Medical management recommended.  Marland Kitchen GIB (gastrointestinal bleeding)    a. occurred in 2016, secondary to gastritis and diverticulosis  . History of gout   . History of stomach ulcers   . Hypercholesteremia   . Hypertension   . Hypertensive heart disease   . LVH (left ventricular hypertrophy)    a. 03/2016 Echo: EF 55-65%, no rwma, Gr1 DD, sev LVH, mildly dil RA/LA, small peric eff;  b. 03/2016 Cardiac MRI: sev LVH, mildly dil RV, possible RV thrombus, diffuse LGE involving RV and diff subendocardial LGE in LV. *No M spike on SPEP, nl immunofixation pattern - no clear evidence of cardiac amyloidosis.  . Murmur, cardiac   . PAF (paroxysmal atrial fibrillation) ([place])    a. new-onset 03/2016, placed on Eliquis (CHA2DS2VASc = 6).  . Pericardial effusion    a. diagnosed in 07/2016. Underwent pericardiocentesis at that time.   . Pneumonia    "quite a few times" (08/05/2016)  . Poor appetite   . Prostate cancer ([place])   . STEMI (ST elevation myocardial infarction) (  [place]) 03/2016   Archie Endo 03/26/2016  . Stroke [facility])    a. nonhemorrhagic CVA in 2015    Past Surgical History:  Procedure Laterality Date  . CARDIAC CATHETERIZATION N/A 03/26/2016   Procedure: Left Heart Cath and Coronary Angiography;  Surgeon: Troy Sine, MD;  Location: [place] CV LAB;  Service: Cardiovascular;  Laterality: N/A;  . CARDIAC CATHETERIZATION N/A 08/06/2016   Procedure: Pericardiocentesis;   Surgeon: Sherren Mocha, MD;  Location: [place] CV LAB;  Service: Cardiovascular;  Laterality: N/A;  . COLONOSCOPY WITH PROPOFOL N/A 10/04/2014   Procedure: COLONOSCOPY WITH PROPOFOL;  Surgeon: Laurence Spates, MD;  Location: [place];  Service: Endoscopy;  Laterality: N/A;  . DIAGNOSTIC LAPAROSCOPY  08/05/2016   reduction of strangulated small bowel from left inguinal hernia, TAPP repair with phasix mesh, small bowel resection/notes 08/05/2016  . ESOPHAGOGASTRODUODENOSCOPY N/A 10/03/2014   Procedure: ESOPHAGOGASTRODUODENOSCOPY (EGD);  Surgeon: Winfield Cunas., MD;  [facility] END[facility];  Service: Endoscopy;  Laterality: N/A;  . HERNIA REPAIR    . LAPAROSCOPY N/A 08/05/2016   Procedure: LAPAROSCOPY DIAGNOSTIC AND LAPAROSCOPIC INGUINAL HERNIA REPAIR.;  Surgeon: Clovis Riley, MD;  Location: [place];  Service: General;  Laterality: N/A;  . LAPAROTOMY N/A 08/05/2016   Procedure: Anne Fu WITH  SMALL BOWEL RESECTION.;  Surgeon: Clovis Riley, MD;  Location: [place];  Service: General;  Laterality: N/A;  . PROSTATECTOMY  1990s?    Current Medications: Outpatient Medications Prior to Visit  Medication Sig Dispense Refill  . amoxicillin-clavulanate (AUGMENTIN) 875-125 MG tablet Take 1 tablet by mouth every 12 (twelve) hours. 20 tablet 0  . apixaban (ELIQUIS) 5 MG TABS tablet Take 1 tablet (5 mg total) by mouth 2 (two) times daily. 60 tablet 0  . atorvastatin (LIPITOR) 40 MG tablet Take 1 tablet (40 mg total) by mouth daily. 30 tablet 0  . brimonidine (ALPHAGAN) 0.2 % ophthalmic solution Place 1 drop into both eyes at bedtime. 5 mL 0  . colchicine 0.6 MG tablet Take 1 tablet (0.6 mg total) by mouth daily.    . feeding supplement, ENSURE ENLIVE, (ENSURE ENLIVE) LIQD Take 237 mLs by mouth 2 (two) times daily between meals. 60 Bottle 0  . metoprolol (LOPRESSOR) 100 MG tablet Take 1 tablet (100 mg total) by mouth 2 (two) times daily. 60 tablet 0  . mirtazapine (REMERON SOL-TAB)  15 MG disintegrating tablet Take 0.5 tablets (7.5 mg total) by mouth at bedtime. 30 tablet 0  . Multiple Vitamin (MULTIVITAMIN WITH MINERALS) TABS tablet Take 1 tablet by mouth daily.    . ondansetron (ZOFRAN-ODT) 4 MG disintegrating tablet Take 1 tablet (4 mg total) by mouth every 6 (six) hours as needed for nausea. (Patient taking differently: Take 4 mg by mouth every 6 (six) hours as needed for nausea. DISSOLVE IN THE MOUTH) 20 tablet 0  . pantoprazole (PROTONIX) 40 MG tablet Take 1 tablet (40 mg total) by mouth 2 (two) times daily. 60 tablet 0  . sertraline (ZOLOFT) 50 MG tablet Take 1 tablet (50 mg total) by mouth daily. 30 tablet 0  . amiodarone (PACERONE) 100 MG tablet Take 3 tablets (300 mg total) by mouth daily. Please see your cardiologist in 1-2 weeks, you will likely need the dose of Amiodarone reduced further (Patient taking differently: Take 100 mg by mouth daily. Please see your cardiologist in 1-2 weeks, you will likely need the dose of Amiodarone reduced further) 120 tablet 0   No facility-administered medications prior to visit.  Allergies:   Sulfa antibiotics; Ramipril; Ramipril; and Sulfamethoxazole   Social History   Social History  . Marital status: Divorced    Spouse name: N/A  . Number of children: N/A  . Years of education: N/A   Social History Main Topics  . Smoking status: Never Smoker  . Smokeless tobacco: Never Used  . Alcohol use No     Comment: 08/05/2016 "quit drinking in the 1970s"  . Drug use: No  . Sexual activity: Yes   Other Topics Concern  . None   Social History Narrative  . None     Family History:  The patient's family history includes Hypertension in his mother.   Review of Systems:   Please see the history of present illness.     General:  No chills, fever, night sweats or weight changes. Positive for generalized fatigue.  Cardiovascular:  No chest pain, dyspnea on exertion, edema, orthopnea, palpitations, paroxysmal nocturnal  dyspnea. Dermatological: No rash, lesions/masses Respiratory: No cough, dyspnea Urologic: No hematuria, dysuria Abdominal:   No nausea, vomiting, diarrhea, bright red blood per rectum, melena, or hematemesis Neurologic:  No visual changes, changes in mental status. Positive for lower extremity weakness.  All other systems reviewed and are otherwise negative except as noted above.   Physical Exam:    VS:  BP (!) 144/83   Pulse 92   Ht 5\' 11"  (1.803 m)   Wt 139 lb (63 kg)   BMI 19.39 kg/m    General: Frail-appearing elderly African American male, currently in no acute distress. Head: Normocephalic, atraumatic, sclera non-icteric, no xanthomas, nares are without discharge.  Neck: No carotid bruits. JVD not elevated.  Lungs: Respirations regular and unlabored, without wheezes or rales.  Heart: Regular rate and rhythm. No S3 or S4.  No murmur, no rubs, or gallops appreciated. Abdomen: Soft, non-tender, non-distended with normoactive bowel sounds. No hepatomegaly. No rebound/guarding. No obvious abdominal masses. Msk:  Strength and tone appear normal for age. No joint deformities or effusions. Extremities: No clubbing or cyanosis. 1+ pitting edema up to mid-shins bilaterally.  Distal pedal pulses are 2+ bilaterally. Neuro: Alert and oriented X 3. Moves all extremities spontaneously. No focal deficits noted. Psych:  Responds to questions appropriately with a normal affect. Skin: No rashes or lesions noted  Wt Readings from Last 3 Encounters:  10/07/16 139 lb (63 kg)  09/18/16 130 lb 8.2 oz (59.2 kg)  08/24/16 148 lb (67.1 kg)     Studies/Labs Reviewed:   EKG:  EKG is not ordered today.   Recent Labs: 107-06-202017: TSH 1.74 08/15/2016: Magnesium 2.2 09/20/2016: Hemoglobin 10.7; Platelets 99 09/22/2016: ALT 52; BUN 5; Creatinine, Ser 0.74; Potassium 3.7; Sodium 138   Lipid Panel    Component Value Date/Time   CHOL 85 107-06-202017 0825   TRIG 28 107-06-202017 0825   HDL 46 107-06-202017  0825   CHOLHDL 1.8 107-06-202017 0825   VLDL 6 107-06-202017 0825   LDLCALC 33 107-06-202017 0825    Additional studies/ records that were reviewed today include:   Echocardiogram: 09/20/2016 Study Conclusions  - Left ventricle: The cavity size was normal. There was severe   concentric hypertrophy. Systolic function was normal. The   estimated ejection fraction was in the range of 60% to 65%. Wall   motion was normal; there were no regional wall motion   abnormalities. Features are consistent with a pseudonormal left   ventricular filling pattern, with concomitant abnormal relaxation   and increased filling pressure (grade 2 diastolic  dysfunction).   Doppler parameters are consistent with elevated ventricular   end-diastolic filling pressure. - Mitral valve: There was mild regurgitation. - Left atrium: The atrium was mildly dilated. - Right ventricle: There was severe hypertrophy. Systolic function   was mildly to moderately reduced. - Right atrium: The atrium was mildly dilated. - Pericardium, extracardiac: Mild to moderate pericardial effusion.   Features were not consistent with tamponade physiology.  Impressions:  - When compared to the prior study from 08/13/2016 there is   improvement in pericardial effusion, now mild to moderate with   maximum thickness 1.4 cm located posteriorly, previously 2 cm. No   evidence of tamponade.   Severe biventricular myocardial hypertrophy suspicious for   cardiac amyloidosis.   Assessment:    1. Coronary artery disease involving native coronary artery of native heart without angina pectoris   2. Pericardial effusion   3. PAF (paroxysmal atrial fibrillation) ([place])   4. Chronic diastolic congestive heart failure ([place])   5. Essential hypertension   6. Weakness of both lower extremities      Plan:   In order of problems listed above:  1. CAD without angina - s/p STEMI in 03/2016 w/ 50% dLM stenosis, 75% ost Cx stenosis, 30% mid Cx,  50% Mrg, 30% prox RCA, 60-70% dRCA stenosis, and 50% dPDA stenosis - medical therapy recommended at that time. - denies any recent chest discomfort or worsening dyspnea with exertion. Has been less active than normal secondary to recent hospitalizations and lower extremity weakness. - continue statin therapy and BB. Not on ASA secondary to need for Eliquis.  2. Pericardial Effusion - diagnosed with pericardial effusion with tamponade in 07/2016, having undergone pericardiocentesis on 12/22 with drain removal on 12/24.  - no friction rub appreciated on examination today.  - recent echo on 09/20/2016 showed improvement in the pericardial effusion, now mild to moderate with maximum thickness 1.4 cm located posteriorly and no evidence of tamponade. Mention of possible cardiac amyloidosis in echo report. With multiple co-morbidities, would not be a candidate for further workup at this time.   3. Paroxysmal atrial fibrillation - This patients CHA2DS2-VASc Score and unadjusted Ischemic Stroke Rate (% per year) is equal to 9.7 % stroke rate/year from a score of 6 (HTN, Vascular, Age (2), Stroke (2)). Continue Eliquis for anticoagulation (full dosing as he only meets 1/3 criteria for reduced dosing - if continued weight loss, would reduce to 2.5mg  BID if less than 60 kg). No evidence of active bleeding.  - has only been taking Amiodarone 100mg  daily. Will update current medication list. Continue with this dosing for now. Continue Lopressor 100mg  BID.   4. Chronic Diastolic CHF - EF 123456 by recent echo. - he does have mild lower extremity edema on examination. Lungs are clear. - will start low-dose Lasix 20mg  daily as needed for edema or weight gain > 3 lbs overnight or > 5 lbs in one week. Sodium restriction reviewed with the patient.   5. HTN - BP at 144/83 today. Readings at home in the 120's- 130's/80's. - continue current medication regimen.   6. Generalized Weakness/ Decreased Appetite - recently  admitted for gallstone pancreatitis along with Klebsiella oxytoca bacteremia. Reports feeling weak since his recent hospitalization, mostly noting weakness along his lower extremities. Receiving Home Health PT and OT. Weight is down from 173 lbs in 07/2016 to 139 lbs at today's visit.  - encouraged consumption of nutritional supplements (Ensure) between meals.     Medication Adjustments/Labs and Tests  Ordered: Current medicines are reviewed at length with the patient today.  Concerns regarding medicines are outlined above.  Medication changes, Labs and Tests ordered today are listed in the Patient Instructions below. Patient Instructions  Medication Instructions:  DECREASE- Amiodarone 100 mg daily  START- Lasix 20 mg as needed for Edema  Labwork: None Ordered  Testing/Procedures: None Ordered  Follow-Up: Your physician recommends that you schedule a follow-up appointment in:  Keep appointment with Dr [name] March 7th at 10:00 am  Any Other Special Instructions Will Be Listed Below (If Applicable).  If you need a refill on your cardiac medications before your next appointment, please call your pharmacy.   Arna Medici, [place]  10/07/2016 5:43 PM    [facility] [place], [place], [place]  [address] Phone: [phone]; Fax: [phone]  [address], [place], [place] [address] Phone: [phone]

## 2016-10-07 ENCOUNTER — Encounter: Payer: Self-pay | Admitting: Student

## 2016-10-07 ENCOUNTER — Ambulatory Visit (INDEPENDENT_AMBULATORY_CARE_PROVIDER_SITE_OTHER): Payer: Medicare Other | Admitting: Student

## 2016-10-07 VITALS — BP 144/83 | HR 92 | Ht 71.0 in | Wt 139.0 lb

## 2016-10-07 DIAGNOSIS — I313 Pericardial effusion (noninflammatory): Secondary | ICD-10-CM | POA: Diagnosis not present

## 2016-10-07 DIAGNOSIS — R29898 Other symptoms and signs involving the musculoskeletal system: Secondary | ICD-10-CM

## 2016-10-07 DIAGNOSIS — I5032 Chronic diastolic (congestive) heart failure: Secondary | ICD-10-CM | POA: Diagnosis not present

## 2016-10-07 DIAGNOSIS — I2510 Atherosclerotic heart disease of native coronary artery without angina pectoris: Secondary | ICD-10-CM | POA: Diagnosis not present

## 2016-10-07 DIAGNOSIS — I480 Paroxysmal atrial fibrillation: Secondary | ICD-10-CM | POA: Diagnosis not present

## 2016-10-07 DIAGNOSIS — I10 Essential (primary) hypertension: Secondary | ICD-10-CM

## 2016-10-07 DIAGNOSIS — I3139 Other pericardial effusion (noninflammatory): Secondary | ICD-10-CM

## 2016-10-07 MED ORDER — FUROSEMIDE 20 MG PO TABS: 20.0000 mg | ORAL_TABLET | ORAL | 3 refills | Status: DC | PRN

## 2016-10-07 NOTE — Patient Instructions (Addendum)
Medication Instructions:  DECREASE- Amiodarone 100 mg daily  START- Lasix 20 mg as needed for Edema  Labwork: None Ordered  Testing/Procedures: None Ordered  Follow-Up: Your physician recommends that you schedule a follow-up appointment in:  Keep appointment with Dr [name] March 7th at 10:00 am   Any Other Special Instructions Will Be Listed Below (If Applicable).   If you need a refill on your cardiac medications before your next appointment, please call your pharmacy.

## 2016-10-12 ENCOUNTER — Telehealth: Payer: Self-pay | Admitting: Cardiovascular Disease

## 2016-10-12 NOTE — Telephone Encounter (Signed)
She needs an order for a Education officer, museum please.

## 2016-10-12 NOTE — Telephone Encounter (Signed)
Returned call to [facility]. She notes concerns due to patient living in home alone, scarcity of regular meals, care, etc. Needs social worker for resource assistance, meals on wheels, identify other needs.  I asked if this could come from PCP office, she states no, since our office signed their 485 form for home health they need this to come from [place].  Informed her I'd check w Dr. Evette Georges nurse. Gregory [name] did advise OK to give verbal for social worker consult, and that Dr. [name] or provider who initiated home care can sign revisions once faxed.  I've communicated verbal to caller, who voiced understanding. She'll fax any needed paperwork to [place].

## 2016-10-20 ENCOUNTER — Ambulatory Visit: Payer: Medicare Other | Admitting: Cardiovascular Disease

## 2016-10-22 ENCOUNTER — Telehealth: Payer: Self-pay | Admitting: Cardiovascular Disease

## 2016-10-22 NOTE — Telephone Encounter (Signed)
Returned call to [place] - OK to work w/patient an additional 2 weeks.   Per previous notes, Dr. Arnette Norris have OK'ed home care via [place]

## 2016-10-22 NOTE — Telephone Encounter (Signed)
New Message  [place] voiced needing verbal orders for this pt please f/u.  One time a week for two weeks for community resources.  Please f/u

## 2016-10-25 ENCOUNTER — Telehealth: Payer: Self-pay | Admitting: Cardiovascular Disease

## 2016-10-25 NOTE — Telephone Encounter (Signed)
Thanks for letting me know!

## 2016-10-25 NOTE — Telephone Encounter (Signed)
Spoke with Beckie Busing the home care-physical therapist she states that pt's O2 sat goes down with exertion to 76% then with rest it only goes back up to 90% so she stopped and will return Wednesday and will update Dr [name] again should this happen again

## 2016-11-02 ENCOUNTER — Telehealth: Payer: Self-pay | Admitting: Cardiovascular Disease

## 2016-11-02 NOTE — Telephone Encounter (Signed)
She wants to know if you received the orders that was faxed on 11-01-16? If,so please fax them back asap please

## 2016-11-02 NOTE — Telephone Encounter (Signed)
Spoke with nicole she states that she sent over a plan of care for February and states that she needs this signed and faxed back thank you

## 2016-11-08 NOTE — Telephone Encounter (Signed)
We have not received these orders. Dr [name] has signed everything that has been placed in his mailbox. Will await new fax.( Patient also missed his post hosp visit.)

## 2016-11-15 ENCOUNTER — Telehealth: Payer: Self-pay | Admitting: *Deleted

## 2016-11-15 NOTE — Telephone Encounter (Signed)
Home Health orders received 3/292018 signed by Dr. [name] and faxed to [facility] today 5:30pm

## 2016-11-23 ENCOUNTER — Other Ambulatory Visit: Payer: Self-pay | Admitting: Cardiovascular Disease

## 2016-11-23 MED ORDER — AMIODARONE HCL 100 MG PO TABS: 100.0000 mg | ORAL_TABLET | Freq: Every day | ORAL | 1 refills | Status: DC

## 2016-11-23 NOTE — Telephone Encounter (Signed)
°*  STAT* If patient is at the pharmacy, call can be transferred to refill team.   1. Which medications need to be refilled? (please list name of each medication and dose if known) Amiodarone 200mg  tablets   2. Which pharmacy/location (including street and city if local pharmacy) is medication to be sent to?[facility]   3. Do they need a 30 day or 90 day supply? [place]

## 2016-11-23 NOTE — Telephone Encounter (Signed)
Rx(s) sent to pharmacy electronically.  

## 2016-11-30 ENCOUNTER — Telehealth: Payer: Self-pay | Admitting: Physician Assistant

## 2016-11-30 NOTE — Telephone Encounter (Signed)
Received records from [facility] for appointment on 12/01/16 with Almyra Deforest, PA  Records put with Hao's schedule for 12/01/16. lp

## 2016-12-01 ENCOUNTER — Ambulatory Visit (INDEPENDENT_AMBULATORY_CARE_PROVIDER_SITE_OTHER): Payer: Medicare Other | Admitting: Physician Assistant

## 2016-12-01 ENCOUNTER — Telehealth: Payer: Self-pay | Admitting: *Deleted

## 2016-12-01 ENCOUNTER — Encounter: Payer: Self-pay | Admitting: Physician Assistant

## 2016-12-01 VITALS — BP 110/68 | HR 60 | Ht 71.0 in | Wt 146.0 lb

## 2016-12-01 DIAGNOSIS — R600 Localized edema: Secondary | ICD-10-CM

## 2016-12-01 DIAGNOSIS — Z79899 Other long term (current) drug therapy: Secondary | ICD-10-CM

## 2016-12-01 DIAGNOSIS — I5032 Chronic diastolic (congestive) heart failure: Secondary | ICD-10-CM

## 2016-12-01 DIAGNOSIS — I480 Paroxysmal atrial fibrillation: Secondary | ICD-10-CM

## 2016-12-01 DIAGNOSIS — E785 Hyperlipidemia, unspecified: Secondary | ICD-10-CM

## 2016-12-01 DIAGNOSIS — I10 Essential (primary) hypertension: Secondary | ICD-10-CM

## 2016-12-01 DIAGNOSIS — S81801A Unspecified open wound, right lower leg, initial encounter: Secondary | ICD-10-CM | POA: Diagnosis not present

## 2016-12-01 DIAGNOSIS — I2510 Atherosclerotic heart disease of native coronary artery without angina pectoris: Secondary | ICD-10-CM | POA: Diagnosis not present

## 2016-12-01 MED ORDER — FUROSEMIDE 20 MG PO TABS: 20.0000 mg | ORAL_TABLET | Freq: Every day | ORAL | 3 refills | Status: DC

## 2016-12-01 MED ORDER — POTASSIUM CHLORIDE ER 10 MEQ PO TBCR: 10.0000 meq | EXTENDED_RELEASE_TABLET | Freq: Every day | ORAL | 3 refills | Status: DC

## 2016-12-01 NOTE — Patient Instructions (Signed)
Medication Instructions:  START lasix 20mg  (1 tablet) one time daily  START potassium 80meq daily.    *If you do not take your lasix do not take potassium*  Labwork: Please have labwork today (BMET) and repeat in 1 week (BMET).   [place] [place] [address]  Testing/Procedures: Your physician has requested that you have an ankle brachial index (ABI). During this test an ultrasound and blood pressure cuff are used to evaluate the arteries that supply the arms and legs with blood. Allow thirty minutes for this exam. There are no restrictions or special instructions.  This will be done at the [facility] office.  Follow-Up:  4/25 at 11:30 with Almyra Deforest PA (ok to overbook per Almyra Deforest PA)  Any Other Special Instructions Will Be Listed Below (If Applicable).  *We have referred you to the wound care center  If you need a refill on your cardiac medications before your next appointment, please call your pharmacy.

## 2016-12-01 NOTE — Progress Notes (Signed)
Cardiology Office Note    Date:  12/01/2016   ID:  Gregory [name], DOB 10-14-1929, MRN [id number]  PCP:  [name], MD  Cardiologist:  Dr. [name]  Chief Complaint  Patient presents with  . Follow-up    pt c/o leg and foot pains. Seen for Dr. [name]    History of Present Illness:  Gregory [name] is a 83 y.o. male with PMH of CAD (s/p STEMI in 03/2016 w/ 50% dLM stenosis, 75% ost Cx stenosis, 30% mid Cx, 50% Mrg, 30% prox RCA, 60-70% dRCA stenosis, and 50% dPDA stenosis - medical therapy recommended), prior nonhemorrhagic CVA, chronic diastolic CHF, paroxysmal atrial fibrillation (on Eliquis), HTN, and HLD. He was admitted in December with worsening lower abdominal pain and found to have an incarcerated inguinal hernia. He underwent laparoscopic repair with small bowel resection on 12/21. Postoperatively, he was felt to have pericardial effusion was tapped and underwent pericardiocentesis on 12/22, the pericardial drain was removed on 12/24. He also had atrial fibrillation with RVR during the admission and was started on amiodarone and continued on high-dose Lipitor. He was readmitted in February for chest and abdominal discomfort. Troponin was flat at 0.05. He was found to have gallstone pancreatitis and GI did not recommend ERCP and surgery advised against invasive options. He was also diagnosed with positive blood culture for Klebsiella oxytoca. Palliative care was consulted to discuss goals of care and he was made DO NOT RESUSCITATE. Echocardiogram obtained 09/20/2016 showed improvement of the pericardial effusion, now mild to moderate located posteriorly with no evidence of temponade. There was mention of possible cardiac amyloidosis, however with multiple comorbidities, he would not be a candidate for further workup at this time. He was started on Lasix PRN 20mg  for edema.  Patient presents today for cardiology office follow-up. He has at least 3+ edema in the right lower extremity and 2+  pitting edema in the left lower extremity. He has a lot of tenderness in the LLE. He also has a deep nonhealing wound in the left heel. He says it has been going on for quite some time and it is actively draining. Based on records received from [facility], they are managing his stage II pressure ulcer in the left heel. I initially wanted to refer the patient to the wound clinic, however if it unclear to me to wound care nurse if already involved or not. I have left them a message for them to call me. On further review, it appears his last ABI and the lower extremity arterial Doppler was obtained in 2015 which showed at least moderate disease even back then. I am concerned that he is having decreased blood flow in the lower extremity which continue to contribute to the poor healing of the wound. I was unable to feel the lower extremity pulses due to the swelling. Of course the edema in bilateral lower extremity can also interfere with wound healing as well. I recommended him to increase his Lasix to 20 mg daily and we will also add potassium chloride supplement 10 mEq daily as well. The Lasix was originally prescribed for as needed basis, but since someone else is managing his medication pill box, he does not know when to take that PRN Lasix. I will see him back in a week to reassess the lower extremity. He will need a basic metabolic panel today and in one week.     Past Medical History:  Diagnosis Date  . Arthritis    "  legs, knees" (08/05/2016)  . CAD (coronary artery disease)    a. cath 03/2016: diffuse disease w/ 50% ostial LM stenosis, 75% ostial Cx, 50% 3rd Mrg, 65% distal RCA, and 50% RPDA. Medical management recommended.  [place] Kitchen GIB (gastrointestinal bleeding)    a. occurred in 2016, secondary to gastritis and diverticulosis  . History of gout   . History of stomach ulcers   . Hypercholesteremia   . Hypertension   . Hypertensive heart disease   . LVH (left ventricular hypertrophy)     a. 03/2016 Echo: EF 55-65%, no rwma, Gr1 DD, sev LVH, mildly dil RA/LA, small peric eff;  b. 03/2016 Cardiac MRI: sev LVH, mildly dil RV, possible RV thrombus, diffuse LGE involving RV and diff subendocardial LGE in LV. *No M spike on SPEP, nl immunofixation pattern - no clear evidence of cardiac amyloidosis.  . Murmur, cardiac   . PAF (paroxysmal atrial fibrillation) ([place])    a. new-onset 03/2016, placed on Eliquis (CHA2DS2VASc = 6).  . Pericardial effusion    a. diagnosed in 07/2016. Underwent pericardiocentesis at that time.   . Pneumonia    "quite a few times" (08/05/2016)  . Poor appetite   . Prostate cancer ([place])   . STEMI (ST elevation myocardial infarction) ([place]) 03/2016   Gregory [name] 03/26/2016  . Stroke [facility])    a. nonhemorrhagic CVA in 2015    Past Surgical History:  Procedure Laterality Date  . CARDIAC CATHETERIZATION N/A 03/26/2016   Procedure: Left Heart Cath and Coronary Angiography;  Surgeon: Troy Sine, MD;  Location: [place] [facility];  Service: Cardiovascular;  Laterality: N/A;  . CARDIAC CATHETERIZATION N/A 08/06/2016   Procedure: Pericardiocentesis;  Surgeon: Sherren Mocha, MD;  Location: [place] [facility];  Service: Cardiovascular;  Laterality: N/A;  . COLONOSCOPY WITH PROPOFOL N/A 10/04/2014   Procedure: COLONOSCOPY WITH PROPOFOL;  Surgeon: Laurence Spates, MD;  Location: [place];  Service: Endoscopy;  Laterality: N/A;  . DIAGNOSTIC LAPAROSCOPY  08/05/2016   reduction of strangulated small bowel from left inguinal hernia, TAPP repair with phasix mesh, small bowel resection/notes 08/05/2016  . ESOPHAGOGASTRODUODENOSCOPY N/A 10/03/2014   Procedure: ESOPHAGOGASTRODUODENOSCOPY (EGD);  Surgeon: Winfield Cunas., MD;  Location: [facility] ENDOSCOPY;  Service: Endoscopy;  Laterality: N/A;  . HERNIA REPAIR    . LAPAROSCOPY N/A 08/05/2016   Procedure: LAPAROSCOPY DIAGNOSTIC AND LAPAROSCOPIC INGUINAL HERNIA REPAIR.;  Surgeon: Clovis Riley, MD;  Location: [place];  Service:  General;  Laterality: N/A;  . LAPAROTOMY N/A 08/05/2016   Procedure: Anne Fu WITH  SMALL BOWEL RESECTION.;  Surgeon: Clovis Riley, MD;  Location: [place];  Service: General;  Laterality: N/A;  . PROSTATECTOMY  1990s?    Current Medications: Outpatient Medications Prior to Visit  Medication Sig Dispense Refill  . amiodarone (PACERONE) 100 MG tablet Take 1 tablet (100 mg total) by mouth daily. 90 tablet 1  . amoxicillin-clavulanate (AUGMENTIN) 875-125 MG tablet Take 1 tablet by mouth every 12 (twelve) hours. 20 tablet 0  . apixaban (ELIQUIS) 5 MG TABS tablet Take 1 tablet (5 mg total) by mouth 2 (two) times daily. 60 tablet 0  . atorvastatin (LIPITOR) 40 MG tablet Take 1 tablet (40 mg total) by mouth daily. 30 tablet 0  . brimonidine (ALPHAGAN) 0.2 % ophthalmic solution Place 1 drop into both eyes at bedtime. 5 mL 0  . colchicine 0.6 MG tablet Take 1 tablet (0.6 mg total) by mouth daily.    . feeding supplement, ENSURE ENLIVE, (ENSURE ENLIVE) LIQD Take 237  mLs by mouth 2 (two) times daily between meals. 60 Bottle 0  . metoprolol (LOPRESSOR) 100 MG tablet Take 1 tablet (100 mg total) by mouth 2 (two) times daily. 60 tablet 0  . mirtazapine (REMERON SOL-TAB) 15 MG disintegrating tablet Take 0.5 tablets (7.5 mg total) by mouth at bedtime. 30 tablet 0  . Multiple Vitamin (MULTIVITAMIN WITH MINERALS) TABS tablet Take 1 tablet by mouth daily.    . ondansetron (ZOFRAN-ODT) 4 MG disintegrating tablet Take 1 tablet (4 mg total) by mouth every 6 (six) hours as needed for nausea. (Patient taking differently: Take 4 mg by mouth every 6 (six) hours as needed for nausea. DISSOLVE IN THE MOUTH) 20 tablet 0  . pantoprazole (PROTONIX) 40 MG tablet Take 1 tablet (40 mg total) by mouth 2 (two) times daily. 60 tablet 0  . sertraline (ZOLOFT) 50 MG tablet Take 1 tablet (50 mg total) by mouth daily. 30 tablet 0  . furosemide (LASIX) 20 MG tablet Take 1 tablet (20 mg total) by mouth as needed. FOR EDEMA  30 tablet 3   No facility-administered medications prior to visit.      Allergies:   Sulfa antibiotics; Ramipril; Ramipril; and Sulfamethoxazole   Social History   Social History  . Marital status: Divorced    Spouse name: N/A  . Number of children: N/A  . Years of education: N/A   Social History Main Topics  . Smoking status: Never Smoker  . Smokeless tobacco: Never Used  . Alcohol use No     Comment: 08/05/2016 "quit drinking in the 1970s"  . Drug use: No  . Sexual activity: Yes   Other Topics Concern  . None   Social History Narrative  . None     Family History:  The patient's family history includes Hypertension in his mother.   ROS:   Please see the history of present illness.    ROS All other systems reviewed and are negative.   PHYSICAL EXAM:   VS:  BP 110/68   Pulse 60   Ht 5\' 11"  (1.803 m)   Wt 146 lb (66.2 kg)   BMI 20.36 kg/m    GEN: Well nourished, well developed, in no acute distress  HEENT: normal  Neck: no JVD, carotid bruits, or masses Cardiac: RRR; no murmurs, rubs, or gallops. 3+ RLE and 2+ LLE edema  Respiratory:  clear to auscultation bilaterally, normal work of breathing GI: soft, nontender, nondistended, + BS MS: no deformity or atrophy  Skin: warm and dry, no rash   +deep L heel ulcer, dressing in place, but draining Neuro:  Alert and Oriented x 3, Strength and sensation are intact Psych: euthymic mood, full affect  Wt Readings from Last 3 Encounters:  12/01/16 146 lb (66.2 kg)  10/07/16 139 lb (63 kg)  09/18/16 130 lb 8.2 oz (59.2 kg)      Studies/Labs Reviewed:   EKG:  EKG is not ordered today.    Recent Labs: 107-Aug-202017: TSH 1.74 08/15/2016: Magnesium 2.2 09/20/2016: Hemoglobin 10.7; Platelets 99 09/22/2016: ALT 52; BUN 5; Creatinine, Ser 0.74; Potassium 3.7; Sodium 138   Lipid Panel    Component Value Date/Time   CHOL 85 107-Aug-202017 0825   TRIG 28 107-Aug-202017 0825   HDL 46 107-Aug-202017 0825   CHOLHDL 1.8 107-Aug-202017 0825     VLDL 6 107-Aug-202017 0825   LDLCALC 33 107-Aug-202017 0825    Additional studies/ records that were reviewed today include:   Cath 03/26/2016 Conclusion  Ost LM to LM lesion, 50 %stenosed.  Ost Cx lesion, 75 %stenosed.  Mid Cx lesion, 30 %stenosed.  3rd Mrg lesion, 50 %stenosed.  Prox RCA lesion, 30 %stenosed.  Dist RCA lesion, 65 %stenosed.  RPDA lesion, 50 %stenosed.  The left ventricular systolic function is normal.  LV end diastolic pressure is normal.  The left ventricular ejection fraction is greater than 65% by visual estimate.   Hyperdynamic LV function without focal segmental wall motion abnormalities.  Coronary calcification with 50% distal left main stenosis, a normal LAD, 75% ostial circumflex stenosis with 30% mid circumflex and 50% distal marginal stenosis and 30% proximal RCA stenosis with 60-70% distal stenosis proximal to the PDA takeoff and 50% distal PDA stenosis.  RECOMMENDATION: Recommended initial medical therapy trial with aggressive medical management.     Echo 09/20/2016 LV EF: 60% -   65%  ------------------------------------------------------------------- Indications:      Pericardial effusion 423.9.  ------------------------------------------------------------------- History:   PMH:  LVH.  Atrial fibrillation.  Stroke.  PMH: Myocardial infarction.  Risk factors:  Hypertension.  ------------------------------------------------------------------- Study Conclusions  - Left ventricle: The cavity size was normal. There was severe   concentric hypertrophy. Systolic function was normal. The   estimated ejection fraction was in the range of 60% to 65%. Wall   motion was normal; there were no regional wall motion   abnormalities. Features are consistent with a pseudonormal left   ventricular filling pattern, with concomitant abnormal relaxation   and increased filling pressure (grade 2 diastolic dysfunction).   Doppler parameters are  consistent with elevated ventricular   end-diastolic filling pressure. - Mitral valve: There was mild regurgitation. - Left atrium: The atrium was mildly dilated. - Right ventricle: There was severe hypertrophy. Systolic function   was mildly to moderately reduced. - Right atrium: The atrium was mildly dilated. - Pericardium, extracardiac: Mild to moderate pericardial effusion.   Features were not consistent with tamponade physiology.  Impressions:  - When compared to the prior study from 08/13/2016 there is   improvement in pericardial effusion, now mild to moderate with   maximum thickness 1.4 cm located posteriorly, previously 2 cm. No   evidence of tamponade.   Severe biventricular myocardial hypertrophy suspicious for   cardiac amyloidosis.   ASSESSMENT:    1. Bilateral lower extremity edema   2. Wound of right lower extremity, initial encounter   3. Medication management   4. Coronary artery disease involving native coronary artery of native heart without angina pectoris   5. Chronic diastolic heart failure ([place])   6. PAF (paroxysmal atrial fibrillation) ([place])   7. Essential hypertension   8. Hyperlipidemia, unspecified hyperlipidemia type      PLAN:  In order of problems listed above:  1. Bilateral lower extremity edema  - He is unable to manage his medication, he does not know when to take the PRN lasix. I will simplify his medication by starting him on 20 mg daily of Lasix and 10 mEq of potassium chloride.  - I will see him back in a week to reassess  2. Chronic diastolic heart failure: Unclear if low extremity edema present acute heart failure, on physical exam, his lung is moving air quite well except only mild decrease in the right base. No obvious sign of interstitial edema on physical exam.  3. L heel ulcer: He will need to follow-up with his primary care physician closely, his left heel ulcer is actively draining. I initially wanted to refer him to the wound  clinic, however he says his wound is being managed by [facility] home health nurse, I am unclear if he is already being followed by wound care nurse. I contacted [facility], however was unable to reach his nurse. I left my contact information, they will call me back. I was unable to feel the lower extremity pulses due to the swelling. However based on the previous lower extremity Doppler in 2015, he already had moderate disease back then, I will repeat a lower extremity Doppler with ABI to make sure the left heel ulcer is not due to poor perfusion.  4. CAD s/p STEMI in 03/2016 w/ 50% dLM stenosis, 75% ost Cx stenosis, 30% mid Cx, 50% Mrg, 30% prox RCA, 60-70% dRCA stenosis, and 50% dPDA stenosis - medical therapy recommended: No obvious angina  5. PAF on eliquis: Continue amiodarone 100 mg daily.  6. HTN: Blood pressure well-controlled 110/68, continue metoprolol  7. HLD: On Lipitor 40 mg daily.    Medication Adjustments/Labs and Tests Ordered: Current medicines are reviewed at length with the patient today.  Concerns regarding medicines are outlined above.  Medication changes, Labs and Tests ordered today are listed in the Patient Instructions below. Patient Instructions  Medication Instructions:  START lasix 20mg  (1 tablet) one time daily  START potassium 38meq daily.    *If you do not take your lasix do not take potassium*  Labwork: Please have labwork today (BMET) and repeat in 1 week (BMET).   [place] [place] [address]  Testing/Procedures: Your physician has requested that you have an ankle brachial index (ABI). During this test an ultrasound and blood pressure cuff are used to evaluate the arteries that supply the arms and legs with blood. Allow thirty minutes for this exam. There are no restrictions or special instructions.  This will be done at the [facility] office.  Follow-Up:  4/25 at 11:30 with Gregory Deforest PA (ok to overbook per Gregory Deforest PA)  Any Other Special Instructions  Will Be Listed Below (If Applicable).  *We have referred you to the wound care center  If you need a refill on your cardiac medications before your next appointment, please call your pharmacy.      Gregory [name], [place]  12/01/2016 4:01 PM    [facility] [address], [place], [place]  [address] Phone: [phone]; Fax: [phone]

## 2016-12-01 NOTE — Telephone Encounter (Signed)
Spoke with patient regarding appointment at the [facility]--12/14/16 @ 1:30pm.  He stated he was trying to get in the house and was not feeling well.  I asked if I could mail the information to him and he stated that would be great.  I will mail information, date time and address.

## 2016-12-02 LAB — BASIC METABOLIC PANEL
BUN / CREAT RATIO: 21 (ref 10–24)
BUN: 19 mg/dL (ref 8–27)
CO2: 23 mmol/L (ref 18–29)
Calcium: 8.8 mg/dL (ref 8.6–10.2)
Chloride: 103 mmol/L (ref 96–106)
Creatinine, Ser: 0.89 mg/dL (ref 0.76–1.27)
GFR calc non Af Amer: 79 mL/min/{1.73_m2} (ref >59)
GFR, EST AFRICAN AMERICAN: 91 mL/min/{1.73_m2} (ref >59)
Glucose: 85 mg/dL (ref 65–99)
POTASSIUM: 4.2 mmol/L (ref 3.5–5.2)
SODIUM: 143 mmol/L (ref 134–144)

## 2016-12-08 ENCOUNTER — Encounter: Payer: Self-pay | Admitting: Physician Assistant

## 2016-12-08 ENCOUNTER — Ambulatory Visit (INDEPENDENT_AMBULATORY_CARE_PROVIDER_SITE_OTHER): Payer: Medicare Other | Admitting: Physician Assistant

## 2016-12-08 VITALS — BP 110/64 | HR 54 | Ht 71.0 in | Wt 146.0 lb

## 2016-12-08 DIAGNOSIS — Z79899 Other long term (current) drug therapy: Secondary | ICD-10-CM

## 2016-12-08 DIAGNOSIS — I2510 Atherosclerotic heart disease of native coronary artery without angina pectoris: Secondary | ICD-10-CM

## 2016-12-08 DIAGNOSIS — I10 Essential (primary) hypertension: Secondary | ICD-10-CM | POA: Diagnosis not present

## 2016-12-08 DIAGNOSIS — E785 Hyperlipidemia, unspecified: Secondary | ICD-10-CM

## 2016-12-08 DIAGNOSIS — R600 Localized edema: Secondary | ICD-10-CM

## 2016-12-08 DIAGNOSIS — I5032 Chronic diastolic (congestive) heart failure: Secondary | ICD-10-CM

## 2016-12-08 DIAGNOSIS — S81802D Unspecified open wound, left lower leg, subsequent encounter: Secondary | ICD-10-CM

## 2016-12-08 DIAGNOSIS — I480 Paroxysmal atrial fibrillation: Secondary | ICD-10-CM | POA: Diagnosis not present

## 2016-12-08 NOTE — Progress Notes (Signed)
Cardiology Office Note    Date:  12/09/2016   ID:  Gregory [name], DOB 1930-02-07, MRN [id number]  PCP:  [name], MD  Cardiologist:  Dr. [name]  Chief Complaint  Patient presents with  . Follow-up    seen for Dr. [name]    History of Present Illness:  Gregory [name] is a 83 y.o. male with PMH of CAD (s/p STEMI in 03/2016 w/ 50% dLM stenosis, 75% ost Cx stenosis, 30% mid Cx, 50% Mrg, 30% prox RCA, 60-70% dRCA stenosis, and 50% dPDA stenosis - medical therapy recommended), prior nonhemorrhagic CVA, chronic diastolic CHF, paroxysmal atrial fibrillation (on Eliquis), HTN, and HLD. He was admitted in December with worsening lower abdominal pain and found to have an incarcerated inguinal hernia. He underwent laparoscopic repair with small bowel resection on 12/21. Postoperatively, he was felt to have pericardial effusion was tapped and underwent pericardiocentesis on 12/22, the pericardial drain was removed on 12/24. He also had atrial fibrillation with RVR during the admission and was started on amiodarone and continued on high-dose Lipitor. He was readmitted in February for chest and abdominal discomfort. Troponin was flat at 0.05. He was found to have gallstone pancreatitis and GI did not recommend ERCP and surgery advised against invasive options. He was also diagnosed with positive blood culture for Klebsiella oxytoca. Palliative care was consulted to discuss goals of care and he was made DO NOT RESUSCITATE. Echocardiogram obtained 09/20/2016 showed improvement of the pericardial effusion, now mild to moderate located posteriorly with no evidence of temponade. There was mention of possible cardiac amyloidosis, however with multiple comorbidities, he would not be a candidate for further workup at this time. He was started on Lasix PRN 20mg  for edema.  I last saw the patient on 12/01/2016, he had at least 3+ pitting edema in the right lower extremity and a 2+ pitting edema in the left lower  extremity. He also had a deep nonhealing wound in the left heel. He said it went on for quite some time. I initially wanted to refer the patient to the wound clinic, however it was unclear to me if a wound care nurse has RV involved in his care. I left her nurse at [facility] a message to call me, I have not heard since. But it does look like he was set up to be seen in the wound care center on 12/14/2016. On further review, his last ABI and the lower extremity arterial Doppler obtained in 2015 showed at least moderate disease. I recommended a repeat lower extremity ABI and a arterial Doppler. I also increased his Lasix to 20 mg daily as he was unable to manage his own medication and does not know when to take the PRN doses.   He presents to cardiology office visit today. He continued to have 3+ right lower extremity edema, he does not have any left lower extremity edema despite he has a left heel wound. Due to slow healing nature of the wound, he has upcoming arterial Doppler and ABI in early May. I recommended continue on the current dose of diuretic, low suspicion for DVT since patient has been compliant with eliquis therapy. He may always have some degree of right lower extremity edema. I will repeat a basic metabolic panel today since he has been taking a scheduled diuretic for the past week. He can return to follow-up with Dr. [name] in 3 month.    Past Medical History:  Diagnosis Date  . Arthritis    "  legs, knees" (08/05/2016)  . CAD (coronary artery disease)    a. cath 03/2016: diffuse disease w/ 50% ostial LM stenosis, 75% ostial Cx, 50% 3rd Mrg, 65% distal RCA, and 50% RPDA. Medical management recommended.  Marland Kitchen GIB (gastrointestinal bleeding)    a. occurred in 2016, secondary to gastritis and diverticulosis  . History of gout   . History of stomach ulcers   . Hypercholesteremia   . Hypertension   . Hypertensive heart disease   . LVH (left ventricular hypertrophy)    a. 03/2016  Echo: EF 55-65%, no rwma, Gr1 DD, sev LVH, mildly dil RA/LA, small peric eff;  b. 03/2016 Cardiac MRI: sev LVH, mildly dil RV, possible RV thrombus, diffuse LGE involving RV and diff subendocardial LGE in LV. *No M spike on SPEP, nl immunofixation pattern - no clear evidence of cardiac amyloidosis.  . Murmur, cardiac   . PAF (paroxysmal atrial fibrillation) ([place])    a. new-onset 03/2016, placed on Eliquis (CHA2DS2VASc = 6).  . Pericardial effusion    a. diagnosed in 07/2016. Underwent pericardiocentesis at that time.   . Pneumonia    "quite a few times" (08/05/2016)  . Poor appetite   . Prostate cancer ([place])   . STEMI (ST elevation myocardial infarction) ([place]) 03/2016   Archie Endo 03/26/2016  . Stroke [facility])    a. nonhemorrhagic CVA in 2015    Past Surgical History:  Procedure Laterality Date  . CARDIAC CATHETERIZATION N/A 03/26/2016   Procedure: Left Heart Cath and Coronary Angiography;  Surgeon: Troy Sine, MD;  Location: [place] [facility];  Service: Cardiovascular;  Laterality: N/A;  . CARDIAC CATHETERIZATION N/A 08/06/2016   Procedure: Pericardiocentesis;  Surgeon: Sherren Mocha, MD;  Location: [place] [facility];  Service: Cardiovascular;  Laterality: N/A;  . COLONOSCOPY WITH PROPOFOL N/A 10/04/2014   Procedure: COLONOSCOPY WITH PROPOFOL;  Surgeon: Laurence Spates, MD;  Location: [place];  Service: Endoscopy;  Laterality: N/A;  . DIAGNOSTIC LAPAROSCOPY  08/05/2016   reduction of strangulated small bowel from left inguinal hernia, TAPP repair with phasix mesh, small bowel resection/notes 08/05/2016  . ESOPHAGOGASTRODUODENOSCOPY N/A 10/03/2014   Procedure: ESOPHAGOGASTRODUODENOSCOPY (EGD);  Surgeon: Winfield Cunas., MD;  Location[facility] END[facility];  Service: Endoscopy;  Laterality: N/A;  . HERNIA REPAIR    . LAPAROSCOPY N/A 08/05/2016   Procedure: LAPAROSCOPY DIAGNOSTIC AND LAPAROSCOPIC INGUINAL HERNIA REPAIR.;  Surgeon: Clovis Riley, MD;  Location: [place];  Service: General;   Laterality: N/A;  . LAPAROTOMY N/A 08/05/2016   Procedure: Anne Fu WITH  SMALL BOWEL RESECTION.;  Surgeon: Clovis Riley, MD;  Location: [place];  Service: General;  Laterality: N/A;  . PROSTATECTOMY  1990s?    Current Medications: Outpatient Medications Prior to Visit  Medication Sig Dispense Refill  . amiodarone (PACERONE) 100 MG tablet Take 1 tablet (100 mg total) by mouth daily. 90 tablet 1  . amoxicillin-clavulanate (AUGMENTIN) 875-125 MG tablet Take 1 tablet by mouth every 12 (twelve) hours. 20 tablet 0  . apixaban (ELIQUIS) 5 MG TABS tablet Take 1 tablet (5 mg total) by mouth 2 (two) times daily. 60 tablet 0  . atorvastatin (LIPITOR) 40 MG tablet Take 1 tablet (40 mg total) by mouth daily. 30 tablet 0  . brimonidine (ALPHAGAN) 0.2 % ophthalmic solution Place 1 drop into both eyes at bedtime. 5 mL 0  . colchicine 0.6 MG tablet Take 1 tablet (0.6 mg total) by mouth daily.    . feeding supplement, ENSURE ENLIVE, (ENSURE ENLIVE) LIQD Take 237  mLs by mouth 2 (two) times daily between meals. 60 Bottle 0  . furosemide (LASIX) 20 MG tablet Take 1 tablet (20 mg total) by mouth daily. FOR EDEMA 30 tablet 3  . metoprolol (LOPRESSOR) 100 MG tablet Take 1 tablet (100 mg total) by mouth 2 (two) times daily. 60 tablet 0  . mirtazapine (REMERON SOL-TAB) 15 MG disintegrating tablet Take 0.5 tablets (7.5 mg total) by mouth at bedtime. 30 tablet 0  . Multiple Vitamin (MULTIVITAMIN WITH MINERALS) TABS tablet Take 1 tablet by mouth daily.    . ondansetron (ZOFRAN-ODT) 4 MG disintegrating tablet Take 1 tablet (4 mg total) by mouth every 6 (six) hours as needed for nausea. (Patient taking differently: Take 4 mg by mouth every 6 (six) hours as needed for nausea. DISSOLVE IN THE MOUTH) 20 tablet 0  . pantoprazole (PROTONIX) 40 MG tablet Take 1 tablet (40 mg total) by mouth 2 (two) times daily. 60 tablet 0  . potassium chloride (K-DUR) 10 MEQ tablet Take 1 tablet (10 mEq total) by mouth daily. 30  tablet 3  . sertraline (ZOLOFT) 50 MG tablet Take 1 tablet (50 mg total) by mouth daily. 30 tablet 0   No facility-administered medications prior to visit.      Allergies:   Sulfa antibiotics; Ramipril; Ramipril; and Sulfamethoxazole   Social History   Social History  . Marital status: Divorced    Spouse name: N/A  . Number of children: N/A  . Years of education: N/A   Social History Main Topics  . Smoking status: Never Smoker  . Smokeless tobacco: Never Used  . Alcohol use No     Comment: 08/05/2016 "quit drinking in the 1970s"  . Drug use: No  . Sexual activity: Yes   Other Topics Concern  . None   Social History Narrative  . None     Family History:  The patient's family history includes Hypertension in his mother.   ROS:   Please see the history of present illness.    ROS All other systems reviewed and are negative.   PHYSICAL EXAM:   VS:  BP 110/64   Pulse (!) 54   Ht 5\' 11"  (1.803 m)   Wt 146 lb (66.2 kg)   BMI 20.36 kg/m    GEN: Well nourished, well developed, in no acute distress  HEENT: normal  Neck: no JVD, carotid bruits, or masses Cardiac: RRR; no murmurs, rubs, or gallops,no edema  Respiratory:  clear to auscultation bilaterally, normal work of breathing GI: soft, nontender, nondistended, + BS MS: no deformity or atrophy  Skin: warm and dry, no rash Neuro:  Alert and Oriented x 3, Strength and sensation are intact Psych: euthymic mood, full affect  Wt Readings from Last 3 Encounters:  12/08/16 146 lb (66.2 kg)  12/01/16 146 lb (66.2 kg)  10/07/16 139 lb (63 kg)      Studies/Labs Reviewed:   EKG:  EKG is not ordered today.   Recent Labs: 128-Oct-202017: TSH 1.74 08/15/2016: Magnesium 2.2 09/20/2016: Hemoglobin 10.7; Platelets 99 09/22/2016: ALT 52 12/01/2016: BUN 19; Creatinine, Ser 0.89; Potassium 4.2; Sodium 143   Lipid Panel    Component Value Date/Time   CHOL 85 128-Oct-202017 0825   TRIG 28 128-Oct-202017 0825   HDL 46 128-Oct-202017 0825    CHOLHDL 1.8 128-Oct-202017 0825   VLDL 6 128-Oct-202017 0825   LDLCALC 33 128-Oct-202017 0825    Additional studies/ records that were reviewed today include:   Cath 03/26/2016 Conclusion  Ost LM to LM lesion, 50 %stenosed.  Ost Cx lesion, 75 %stenosed.  Mid Cx lesion, 30 %stenosed.  3rd Mrg lesion, 50 %stenosed.  Prox RCA lesion, 30 %stenosed.  Dist RCA lesion, 65 %stenosed.  RPDA lesion, 50 %stenosed.  The left ventricular systolic function is normal.  LV end diastolic pressure is normal.  The left ventricular ejection fraction is greater than 65% by visual estimate.  Hyperdynamic LV function without focal segmental wall motion abnormalities.  Coronary calcification with 50% distal left main stenosis, a normal LAD, 75% ostial circumflex stenosis with 30% mid circumflex and 50% distal marginal stenosis and 30% proximal RCA stenosis with 60-70% distal stenosis proximal to the PDA takeoff and 50% distal PDA stenosis.  RECOMMENDATION: Recommended initial medical therapy trial with aggressive medical management.     Echo 09/20/2016 LV EF: 60% - 65%  Study Conclusions  - Left ventricle: The cavity size was normal. There was severe concentric hypertrophy. Systolic function was normal. The estimated ejection fraction was in the range of 60% to 65%. Wall motion was normal; there were no regional wall motion abnormalities. Features are consistent with a pseudonormal left ventricular filling pattern, with concomitant abnormal relaxation and increased filling pressure (grade 2 diastolic dysfunction). Doppler parameters are consistent with elevated ventricular end-diastolic filling pressure. - Mitral valve: There was mild regurgitation. - Left atrium: The atrium was mildly dilated. - Right ventricle: There was severe hypertrophy. Systolic function was mildly to moderately reduced. - Right atrium: The atrium was mildly dilated. - Pericardium,  extracardiac: Mild to moderate pericardial effusion. Features were not consistent with tamponade physiology.  Impressions:  - When compared to the prior study from 08/13/2016 there is improvement in pericardial effusion, now mild to moderate with maximum thickness 1.4 cm located posteriorly, previously 2 cm. No evidence of tamponade. Severe biventricular myocardial hypertrophy suspicious for cardiac amyloidosis.    ASSESSMENT:    1. Bilateral lower extremity edema   2. Encounter for long-term (current) use of medications   3. Wound of left lower extremity, subsequent encounter   4. Coronary artery disease involving native coronary artery of native heart without angina pectoris   5. Chronic diastolic heart failure ([place])   6. PAF (paroxysmal atrial fibrillation) ([place])   7. Essential hypertension   8. Hyperlipidemia, unspecified hyperlipidemia type      PLAN:  In order of problems listed above:  1. Bilateral lower extremity edema: Continued to have lower extremity edema, will continue on the current dose of Lasix. He likely will always have some degree of lower extremity edema. Hesitant to increase Lasix further.  2. Chronic diastolic heart failure: Unclear how much of chronic diastolic heart failure is contributing to his lower extremity edema. I did start him on a scheduled dose of Lasix, will check a basic metabolic panel to make sure his renal function stable.  3. Left heel ulcer: Has upcoming follow-up with wound clinic. Also has bilateral lower extremity ABI and arterial Doppler arranged to rule out decreased perfusion as a cause of slow healing  4. CAD s/p STEMI in 03/2016 w/ 50% dLM stenosis, 75% ost Cx stenosis, 30% mid Cx, 50% Mrg, 30% prox RCA, 60-70% dRCA stenosis, and 50% dPDA stenosis: Continue medical therapy, no obvious angina  5. PAF on eliquis: Continue amiodarone 100 mg daily  6. HTN: Blood pressure stable  7. HLD: On Lipitor 40 mg  daily    Medication Adjustments/Labs and Tests Ordered: Current medicines are reviewed at length with the patient today.  Concerns regarding medicines are outlined above.  Medication changes, Labs and Tests ordered today are listed in the Patient Instructions below. Patient Instructions  Medication Instructions:  No changes today   Labwork:  BMET in 1 week (Non-fasting test)   Testing/Procedures: Your physician has requested that you have a lower extremity arterial duplex. This test is an ultrasound of the arteries in the legs. It looks at arterial blood flow in the legs. Allow one hour for Lower and Upper Arterial scans. There are no restrictions or special instructions.   Follow-Up: 3 months with Dr. [name]  Keep the appointment for your upcoming lower extremity doppler exam. If the results of this test are abnormal we will refer you to a peripheral vascular specialist.   Any Other Special Instructions Will Be Listed Below (If Applicable).     If you need a refill on your cardiac medications before your next appointment, please call your pharmacy.      Hilbert Corrigan, [place]  12/09/2016 2:05 PM    [facility] [address], [place], [place]  [address] Phone: [phone]; Fax: [phone]

## 2016-12-08 NOTE — Patient Instructions (Addendum)
Medication Instructions:  No changes today   Labwork:  BMET in 1 week (Non-fasting test)   Testing/Procedures: Your physician has requested that you have a lower extremity arterial duplex. This test is an ultrasound of the arteries in the legs. It looks at arterial blood flow in the legs. Allow one hour for Lower and Upper Arterial scans. There are no restrictions or special instructions.   Follow-Up: 3 months with Dr. [name]  Keep the appointment for your upcoming lower extremity doppler exam. If the results of this test are abnormal we will refer you to a peripheral vascular specialist.   Any Other Special Instructions Will Be Listed Below (If Applicable).     If you need a refill on your cardiac medications before your next appointment, please call your pharmacy.

## 2016-12-09 ENCOUNTER — Encounter: Payer: Self-pay | Admitting: Physician Assistant

## 2016-12-14 ENCOUNTER — Encounter (HOSPITAL_BASED_OUTPATIENT_CLINIC_OR_DEPARTMENT_OTHER): Payer: Medicare Other | Attending: Surgery

## 2016-12-14 DIAGNOSIS — L89623 Pressure ulcer of left heel, stage 3: Secondary | ICD-10-CM | POA: Diagnosis not present

## 2016-12-14 DIAGNOSIS — I252 Old myocardial infarction: Secondary | ICD-10-CM | POA: Insufficient documentation

## 2016-12-14 DIAGNOSIS — I480 Paroxysmal atrial fibrillation: Secondary | ICD-10-CM | POA: Diagnosis not present

## 2016-12-14 DIAGNOSIS — I70201 Unspecified atherosclerosis of native arteries of extremities, right leg: Secondary | ICD-10-CM | POA: Diagnosis not present

## 2016-12-14 DIAGNOSIS — Z79899 Other long term (current) drug therapy: Secondary | ICD-10-CM | POA: Diagnosis not present

## 2016-12-14 DIAGNOSIS — I110 Hypertensive heart disease with heart failure: Secondary | ICD-10-CM | POA: Insufficient documentation

## 2016-12-14 DIAGNOSIS — I2510 Atherosclerotic heart disease of native coronary artery without angina pectoris: Secondary | ICD-10-CM | POA: Insufficient documentation

## 2016-12-14 DIAGNOSIS — I509 Heart failure, unspecified: Secondary | ICD-10-CM | POA: Diagnosis not present

## 2016-12-14 DIAGNOSIS — Z515 Encounter for palliative care: Secondary | ICD-10-CM | POA: Diagnosis not present

## 2016-12-14 DIAGNOSIS — Z66 Do not resuscitate: Secondary | ICD-10-CM | POA: Insufficient documentation

## 2016-12-14 DIAGNOSIS — Z7901 Long term (current) use of anticoagulants: Secondary | ICD-10-CM | POA: Diagnosis not present

## 2016-12-14 DIAGNOSIS — I890 Lymphedema, not elsewhere classified: Secondary | ICD-10-CM | POA: Diagnosis not present

## 2016-12-16 ENCOUNTER — Other Ambulatory Visit: Payer: Self-pay | Admitting: Physician Assistant

## 2016-12-16 ENCOUNTER — Telehealth: Payer: Self-pay | Admitting: Cardiovascular Disease

## 2016-12-16 DIAGNOSIS — R600 Localized edema: Secondary | ICD-10-CM

## 2016-12-16 NOTE — Telephone Encounter (Signed)
Samples at the front desk 

## 2016-12-16 NOTE — Telephone Encounter (Signed)
Notified he will pick up tomorrow

## 2016-12-16 NOTE — Telephone Encounter (Signed)
New message ° °Patient calling the office for samples of medication: ° ° °1.  What medication and dosage are you requesting samples for?apixaban (ELIQUIS) 5 MG TABS tablet ° °2.  Are you currently out of this medication? yes ° ° ° °

## 2016-12-22 DIAGNOSIS — L89623 Pressure ulcer of left heel, stage 3: Secondary | ICD-10-CM | POA: Diagnosis not present

## 2016-12-24 ENCOUNTER — Encounter (HOSPITAL_COMMUNITY): Payer: Medicare Other

## 2016-12-24 ENCOUNTER — Other Ambulatory Visit: Payer: Self-pay | Admitting: Physician Assistant

## 2016-12-24 ENCOUNTER — Ambulatory Visit (HOSPITAL_COMMUNITY)
Admission: RE | Admit: 2016-12-24 | Discharge: 2016-12-24 | Disposition: A | Discharge status: Home / Self Care | Payer: Medicare Other | Source: Ambulatory Visit | Attending: Cardiovascular Disease | Admitting: Cardiovascular Disease

## 2016-12-24 DIAGNOSIS — L97429 Non-pressure chronic ulcer of left heel and midfoot with unspecified severity: Secondary | ICD-10-CM

## 2016-12-24 DIAGNOSIS — I2510 Atherosclerotic heart disease of native coronary artery without angina pectoris: Secondary | ICD-10-CM | POA: Insufficient documentation

## 2016-12-24 DIAGNOSIS — I10 Essential (primary) hypertension: Secondary | ICD-10-CM | POA: Insufficient documentation

## 2016-12-24 DIAGNOSIS — E785 Hyperlipidemia, unspecified: Secondary | ICD-10-CM | POA: Diagnosis not present

## 2016-12-24 DIAGNOSIS — Z8673 Personal history of transient ischemic attack (TIA), and cerebral infarction without residual deficits: Secondary | ICD-10-CM | POA: Insufficient documentation

## 2016-12-27 ENCOUNTER — Telehealth: Payer: Self-pay | Admitting: Cardiovascular Disease

## 2016-12-27 ENCOUNTER — Encounter (HOSPITAL_COMMUNITY): Payer: Medicare Other

## 2016-12-27 NOTE — Telephone Encounter (Signed)
VAS [place] LE ART SEG MULTI (Segm&LE Reynauds)  Order: [id number]  Status:  Final result Visible to patient:  No (Not Released) Dx:  Heel ulceration, left, with unspecifi...  Notes recorded by Therisa Doyne on 12/27/2016 at 1:02 PM EDT Spoke to pt and gave results. Attempted to schedule appt, but pt was very rude and did not want to wait until June. I tried to find a sooner time and he told me to call him back with a date. I tried to call him back and he did not answer. Left message for him to call back about scheduling appt for 12/31/16 with Dr. [name] as New PV Consult. ------  Notes recorded by Erma Heritage, PA-C on 12/26/2016 at 3:56 PM EDT Covering for [facility] - Please let the patient know his doppler studies show abnormal blood flow along his lower extremities. Found to have 50-74% stenosis in the right distal SFA/proximal popliteal artery and left distal SFA. I recommend a Peripheral Vascular consult in the setting of his non-healing lower extremity wounds. Please arrange for him to see Dr. [name] in the next available new PV patient slot. Thanks!

## 2016-12-29 ENCOUNTER — Telehealth: Payer: Self-pay | Admitting: Cardiovascular Disease

## 2016-12-29 DIAGNOSIS — L89623 Pressure ulcer of left heel, stage 3: Secondary | ICD-10-CM | POA: Diagnosis not present

## 2016-12-29 NOTE — Telephone Encounter (Signed)
Message to T. Cannon Kettle, CMA

## 2016-12-29 NOTE — Telephone Encounter (Signed)
New Message  Pt voiced calling to speak with Nelta Numbers to make an appt to see MD-Berry.  Please f/u

## 2016-12-29 NOTE — Telephone Encounter (Signed)
Spoke to pt. Scheduled appt w/ Dr. [name] on 01/11/17 at 9:40 am.

## 2017-01-05 DIAGNOSIS — L89623 Pressure ulcer of left heel, stage 3: Secondary | ICD-10-CM | POA: Diagnosis not present

## 2017-01-06 ENCOUNTER — Telehealth: Payer: Self-pay | Admitting: Cardiovascular Disease

## 2017-01-06 NOTE — Telephone Encounter (Signed)
She wants to know if pt diagnosis is CHF?If he does what was his last EF?

## 2017-01-06 NOTE — Telephone Encounter (Signed)
chronic diastolic CHF-LV EF: 73% -   65% Mitzie Na at [facility] she will call pt and see if he wants to have their heart care benefit

## 2017-01-07 ENCOUNTER — Telehealth: Payer: Self-pay | Admitting: *Deleted

## 2017-01-07 NOTE — Telephone Encounter (Signed)
Notified Lynda Rainwater @ [facility] per Dr [name] that he will not be signing orders for patient's foot ulcer. These orders should be deferred to th patient's PCP or the wound care physician. Elmyra Ricks voiced understanding and states that she will send the orders to the appropriate physician.

## 2017-01-11 ENCOUNTER — Encounter: Payer: Self-pay | Admitting: Cardiovascular Disease

## 2017-01-11 ENCOUNTER — Ambulatory Visit (INDEPENDENT_AMBULATORY_CARE_PROVIDER_SITE_OTHER): Payer: Medicare Other | Admitting: Cardiovascular Disease

## 2017-01-11 VITALS — BP 124/69 | HR 57 | Ht 71.0 in | Wt 144.6 lb

## 2017-01-11 DIAGNOSIS — I10 Essential (primary) hypertension: Secondary | ICD-10-CM

## 2017-01-11 DIAGNOSIS — E7800 Pure hypercholesterolemia, unspecified: Secondary | ICD-10-CM | POA: Diagnosis not present

## 2017-01-11 DIAGNOSIS — I998 Other disorder of circulatory system: Secondary | ICD-10-CM | POA: Insufficient documentation

## 2017-01-11 DIAGNOSIS — I70229 Atherosclerosis of native arteries of extremities with rest pain, unspecified extremity: Secondary | ICD-10-CM | POA: Insufficient documentation

## 2017-01-11 NOTE — Progress Notes (Signed)
01/11/2017 EBUBECHUKWU JEDLICKA   07/28/1930  [id number]  Primary Physician [name], MD Primary Cardiologist: Lorretta Harp MD Renae Gloss  HPI:  Mr. Brumbaugh is a pleasant 83 year old male fell appearing African-American male followed by Dr. [name] history of ischemic heart disease status post STEMI 8/17 with moderate CAD treated medically. He also has a history of PAF on Eliquis,  hypertension, hyperlipidemia and chronic diastolic heart failure. He has asymmetric lower extremity edema right greater than left. He also has a nonhealing ulcer on his left heel which she's had for last 3 months. Lower extremity until Doppler studies performed by/11/18 revealed a right ABI of 0.91 and left of 0.95. He did have a high-frequency signal in his distal left SFA. He's also been referred to the wound care center for aggressive local wound care.   Current Outpatient Prescriptions  Medication Sig Dispense Refill  . amiodarone (PACERONE) 100 MG tablet Take 1 tablet (100 mg total) by mouth daily. 90 tablet 1  . apixaban (ELIQUIS) 5 MG TABS tablet Take 1 tablet (5 mg total) by mouth 2 (two) times daily. 60 tablet 0  . atorvastatin (LIPITOR) 40 MG tablet Take 1 tablet (40 mg total) by mouth daily. 30 tablet 0  . brimonidine (ALPHAGAN) 0.2 % ophthalmic solution Place 1 drop into both eyes at bedtime. 5 mL 0  . colchicine 0.6 MG tablet Take 1 tablet (0.6 mg total) by mouth daily.    . feeding supplement, ENSURE ENLIVE, (ENSURE ENLIVE) LIQD Take 237 mLs by mouth 2 (two) times daily between meals. 60 Bottle 0  . furosemide (LASIX) 20 MG tablet Take 1 tablet (20 mg total) by mouth daily. FOR EDEMA 30 tablet 3  . metoprolol (LOPRESSOR) 100 MG tablet Take 1 tablet (100 mg total) by mouth 2 (two) times daily. 60 tablet 0  . mirtazapine (REMERON SOL-TAB) 15 MG disintegrating tablet Take 0.5 tablets (7.5 mg total) by mouth at bedtime. 30 tablet 0  . Multiple Vitamin (MULTIVITAMIN WITH MINERALS)  TABS tablet Take 1 tablet by mouth daily.    . ondansetron (ZOFRAN-ODT) 4 MG disintegrating tablet Take 1 tablet (4 mg total) by mouth every 6 (six) hours as needed for nausea. (Patient taking differently: Take 4 mg by mouth every 6 (six) hours as needed for nausea. DISSOLVE IN THE MOUTH) 20 tablet 0  . pantoprazole (PROTONIX) 40 MG tablet Take 1 tablet (40 mg total) by mouth 2 (two) times daily. 60 tablet 0  . potassium chloride (K-DUR) 10 MEQ tablet Take 1 tablet (10 mEq total) by mouth daily. 30 tablet 3  . sertraline (ZOLOFT) 50 MG tablet Take 1 tablet (50 mg total) by mouth daily. 30 tablet 0   No current facility-administered medications for this visit.     Allergies  Allergen Reactions  . Sulfa Antibiotics     Reaction not noted on MAR  . Ramipril     Reaction not noted on MAR  . Ramipril Other (See Comments)    Reaction not noted on MAR  . Sulfamethoxazole Rash    Social History   Social History  . Marital status: Divorced    Spouse name: N/A  . Number of children: N/A  . Years of education: N/A   Occupational History  . Not on file.   Social History Main Topics  . Smoking status: Never Smoker  . Smokeless tobacco: Never Used  . Alcohol use No     Comment: 08/05/2016 "quit drinking  in the 1970s"  . Drug use: No  . Sexual activity: Yes   Other Topics Concern  . Not on file   Social History Narrative  . No narrative on file     Review of Systems: General: negative for chills, fever, night sweats or weight changes.  Cardiovascular: negative for chest pain, dyspnea on exertion, edema, orthopnea, palpitations, paroxysmal nocturnal dyspnea or shortness of breath Dermatological: negative for rash Respiratory: negative for cough or wheezing Urologic: negative for hematuria Abdominal: negative for nausea, vomiting, diarrhea, bright red blood per rectum, melena, or hematemesis Neurologic: negative for visual changes, syncope, or dizziness All other systems reviewed  and are otherwise negative except as noted above.    Blood pressure 124/69, pulse (!) 57, height 5\' 11"  (1.803 m), weight 144 lb 9.6 oz (65.6 kg), SpO2 99 %.  General appearance: alert and no distress Neck: no adenopathy, no carotid bruit, no JVD, supple, symmetrical, trachea midline and thyroid not enlarged, symmetric, no tenderness/mass/nodules Lungs: clear to auscultation bilaterally Heart: regular rate and rhythm, S1, S2 normal, no murmur, click, rub or gallop Extremities: 23+ right lower extremity pitting edema, small nonhealing ulcer left heel  EKG not performed today  ASSESSMENT AND PLAN:   Hypertension History of hypertension blood pressure measured 122/82. She is on atenolol. Continue current meds at current dosing  Hyperlipidemia History of hyperlipidemia not on statin therapy followed by her PCP  Critical lower limb ischemia Mr. Mccurdy was referred to me by Dr. [name] and Almyra Deforest PA-C as well as the wound care center for critical limb ischemia. He has had a nonhealing left heel wound for several months. He also has asymmetric edema right greater than left on diuretics. He had repeat recent Dopplers in our office revealing normal ABIs however he had a high-frequency signal in his distal left SFA. This may be limiting blood flow to his left foot and this healing of his left heel ulcer. He does have normal renal function. He is on Eliquis  oral anticoagulation for atrial fibrillation which we will stop 3 days prior to his angiogram and potential left SFA intervention.      Lorretta Harp MD FACP,FACC,FAHA, [facility] 01/11/2017 9:57 AM

## 2017-01-11 NOTE — Assessment & Plan Note (Signed)
Gregory [name] was referred to me by Dr. [name] and Almyra Deforest PA-C as well as the wound care center for critical limb ischemia. He has had a nonhealing left heel wound for several months. He also has asymmetric edema right greater than left on diuretics. He had repeat recent Dopplers in our office revealing normal ABIs however he had a high-frequency signal in his distal left SFA. This may be limiting blood flow to his left foot and this healing of his left heel ulcer. He does have normal renal function. He is on Eliquis  oral anticoagulation for atrial fibrillation which we will stop 3 days prior to his angiogram and potential left SFA intervention.

## 2017-01-11 NOTE — Patient Instructions (Signed)
   [facility] [address] [place] [address] Dept: [phone] Loc: [phone]  Gregory [name]  01/11/2017  You are scheduled for a Peripheral Angiogram on Monday, June 11 with Dr. [name].  1. Please arrive at the [facility] (Main Entrance A) at [facility]: [address] [place], [place] [address] at 7:30 AM (two hours before your procedure to ensure your preparation). Free valet parking service is available.   Special note: Every effort is made to have your procedure done on time. Please understand that emergencies sometimes delay scheduled procedures.  2. Diet: Do not eat or drink anything after midnight prior to your procedure except sips of water to take medications.  3. Labs: You will need to have blood drawn on Tuesday, May 29 at [place], [place]  Open: [place] (Lunch 12:30 - 1:30)   Phone: [phone]. You do not need to be fasting.  4. Medication instructions in preparation for your procedure:  Stop taking Eliquis (Apixiban) on Friday, June 8.  On the morning of your procedure, take any morning medicines NOT listed above.  You may use sips of water.  5. Plan for one night stay--bring personal belongings. 6. Bring a current list of your medications and current insurance cards. 7. You MUST have a responsible person to drive you home. 8. Someone MUST be with you the first 24 hours after you arrive home or your discharge will be delayed. 9. Please wear clothes that are easy to get on and off and wear slip-on shoes.  Thank you for allowing [place] to care for you!   -- [facility]

## 2017-01-11 NOTE — Assessment & Plan Note (Signed)
History of hypertension blood pressure measured 122/82. She is on atenolol. Continue current meds at current dosing

## 2017-01-11 NOTE — Assessment & Plan Note (Signed)
History of hyperlipidemia not on statin therapy followed by her PCP 

## 2017-01-12 LAB — CBC WITH DIFFERENTIAL/PLATELET
BASOS: 1 %
Basophils Absolute: 0 10*3/uL (ref 0.0–0.2)
EOS (ABSOLUTE): 0.1 10*3/uL (ref 0.0–0.4)
EOS: 3 %
HEMATOCRIT: 35.8 % — AB (ref 37.5–51.0)
Hemoglobin: 11.6 g/dL — ABNORMAL LOW (ref 13.0–17.7)
IMMATURE GRANS (ABS): 0 10*3/uL (ref 0.0–0.1)
Immature Granulocytes: 0 %
LYMPHS: 28 %
Lymphocytes Absolute: 1.5 10*3/uL (ref 0.7–3.1)
MCH: 29 pg (ref 26.6–33.0)
MCHC: 32.4 g/dL (ref 31.5–35.7)
MCV: 90 fL (ref 79–97)
Monocytes Absolute: 0.6 10*3/uL (ref 0.1–0.9)
Monocytes: 11 %
Neutrophils Absolute: 2.9 10*3/uL (ref 1.4–7.0)
Neutrophils: 57 %
PLATELETS: 155 10*3/uL (ref 150–379)
RBC: 4 x10E6/uL — AB (ref 4.14–5.80)
RDW: 16.9 % — AB (ref 12.3–15.4)
WBC: 5.1 10*3/uL (ref 3.4–10.8)

## 2017-01-12 LAB — BASIC METABOLIC PANEL
BUN/Creatinine Ratio: 16 (ref 10–24)
BUN: 18 mg/dL (ref 8–27)
CHLORIDE: 105 mmol/L (ref 96–106)
CO2: 23 mmol/L (ref 18–29)
Calcium: 9.2 mg/dL (ref 8.6–10.2)
Creatinine, Ser: 1.11 mg/dL (ref 0.76–1.27)
GFR calc Af Amer: 70 mL/min/{1.73_m2} (ref >59)
GFR calc non Af Amer: 61 mL/min/{1.73_m2} (ref >59)
Glucose: 84 mg/dL (ref 65–99)
POTASSIUM: 4.6 mmol/L (ref 3.5–5.2)
Sodium: 141 mmol/L (ref 134–144)

## 2017-01-12 LAB — PROTIME-INR
INR: 1.2 (ref 0.8–1.2)
Prothrombin Time: 12.3 s — ABNORMAL HIGH (ref 9.1–12.0)

## 2017-01-12 LAB — TSH: TSH: 2.18 u[IU]/mL (ref 0.450–4.500)

## 2017-01-12 LAB — APTT: aPTT: 28 s (ref 24–33)

## 2017-01-19 ENCOUNTER — Other Ambulatory Visit: Payer: Self-pay | Admitting: Cardiovascular Disease

## 2017-01-19 DIAGNOSIS — I998 Other disorder of circulatory system: Secondary | ICD-10-CM

## 2017-01-19 DIAGNOSIS — I70229 Atherosclerosis of native arteries of extremities with rest pain, unspecified extremity: Secondary | ICD-10-CM

## 2017-01-20 ENCOUNTER — Ambulatory Visit
Admission: RE | Admit: 2017-01-20 | Discharge: 2017-01-20 | Disposition: A | Discharge status: Home / Self Care | Payer: Medicare Other | Source: Ambulatory Visit | Attending: Cardiovascular Disease | Admitting: Cardiovascular Disease

## 2017-01-20 DIAGNOSIS — I70229 Atherosclerosis of native arteries of extremities with rest pain, unspecified extremity: Secondary | ICD-10-CM

## 2017-01-20 DIAGNOSIS — I998 Other disorder of circulatory system: Secondary | ICD-10-CM

## 2017-01-24 ENCOUNTER — Other Ambulatory Visit: Payer: Self-pay | Admitting: Physician Assistant

## 2017-01-24 ENCOUNTER — Ambulatory Visit (HOSPITAL_COMMUNITY)
Admission: RE | Admit: 2017-01-24 | Discharge: 2017-01-25 | Disposition: A | Discharge status: Home / Self Care | Payer: Medicare Other | Source: Ambulatory Visit | Attending: Cardiovascular Disease | Admitting: Cardiovascular Disease

## 2017-01-24 ENCOUNTER — Encounter (HOSPITAL_COMMUNITY): Payer: Self-pay | Admitting: Cardiovascular Disease

## 2017-01-24 ENCOUNTER — Encounter (HOSPITAL_COMMUNITY)
Admission: RE | Disposition: A | Discharge status: Home / Self Care | Payer: Self-pay | Source: Ambulatory Visit | Attending: Cardiovascular Disease

## 2017-01-24 DIAGNOSIS — Z882 Allergy status to sulfonamides status: Secondary | ICD-10-CM | POA: Diagnosis not present

## 2017-01-24 DIAGNOSIS — I2510 Atherosclerotic heart disease of native coronary artery without angina pectoris: Secondary | ICD-10-CM | POA: Diagnosis not present

## 2017-01-24 DIAGNOSIS — I998 Other disorder of circulatory system: Secondary | ICD-10-CM | POA: Diagnosis present

## 2017-01-24 DIAGNOSIS — M5134 Other intervertebral disc degeneration, thoracic region: Secondary | ICD-10-CM | POA: Diagnosis not present

## 2017-01-24 DIAGNOSIS — I70201 Unspecified atherosclerosis of native arteries of extremities, right leg: Secondary | ICD-10-CM | POA: Diagnosis not present

## 2017-01-24 DIAGNOSIS — Z7901 Long term (current) use of anticoagulants: Secondary | ICD-10-CM | POA: Insufficient documentation

## 2017-01-24 DIAGNOSIS — I110 Hypertensive heart disease with heart failure: Secondary | ICD-10-CM | POA: Insufficient documentation

## 2017-01-24 DIAGNOSIS — I5032 Chronic diastolic (congestive) heart failure: Secondary | ICD-10-CM | POA: Insufficient documentation

## 2017-01-24 DIAGNOSIS — I70244 Atherosclerosis of native arteries of left leg with ulceration of heel and midfoot: Secondary | ICD-10-CM | POA: Insufficient documentation

## 2017-01-24 DIAGNOSIS — I70229 Atherosclerosis of native arteries of extremities with rest pain, unspecified extremity: Secondary | ICD-10-CM

## 2017-01-24 DIAGNOSIS — L97429 Non-pressure chronic ulcer of left heel and midfoot with unspecified severity: Secondary | ICD-10-CM | POA: Insufficient documentation

## 2017-01-24 DIAGNOSIS — I252 Old myocardial infarction: Secondary | ICD-10-CM | POA: Diagnosis not present

## 2017-01-24 DIAGNOSIS — I70248 Atherosclerosis of native arteries of left leg with ulceration of other part of lower left leg: Secondary | ICD-10-CM | POA: Diagnosis not present

## 2017-01-24 DIAGNOSIS — I739 Peripheral vascular disease, unspecified: Secondary | ICD-10-CM

## 2017-01-24 DIAGNOSIS — E785 Hyperlipidemia, unspecified: Secondary | ICD-10-CM | POA: Insufficient documentation

## 2017-01-24 DIAGNOSIS — I480 Paroxysmal atrial fibrillation: Secondary | ICD-10-CM | POA: Diagnosis not present

## 2017-01-24 HISTORY — PX: PERIPHERAL VASCULAR ATHERECTOMY: CATH118256

## 2017-01-24 HISTORY — DX: Other disorder of circulatory system: I99.8

## 2017-01-24 HISTORY — DX: Atherosclerosis of native arteries of extremities with rest pain, unspecified extremity: I70.229

## 2017-01-24 HISTORY — PX: ABDOMINAL AORTOGRAM W/LOWER EXTREMITY: CATH118223

## 2017-01-24 LAB — POCT ACTIVATED CLOTTING TIME
ACTIVATED CLOTTING TIME: 274 s
ACTIVATED CLOTTING TIME: 246 s

## 2017-01-24 SURGERY — ABDOMINAL AORTOGRAM W/LOWER EXTREMITY
Anesthesia: LOCAL

## 2017-01-24 MED ORDER — PANTOPRAZOLE SODIUM 40 MG PO TBEC
40.0000 mg | DELAYED_RELEASE_TABLET | Freq: Two times a day (BID) | ORAL | Status: DC
  Administered 2017-01-24 – 2017-01-25 (×3): 40 mg via ORAL
  Filled 2017-01-24 (×3): qty 1

## 2017-01-24 MED ORDER — MIDAZOLAM HCL 2 MG/2ML IJ SOLN
INTRAMUSCULAR | Status: AC
  Filled 2017-01-24: qty 2

## 2017-01-24 MED ORDER — BRIMONIDINE TARTRATE 0.2 % OP SOLN
1.0000 [drp] | Freq: Every day | OPHTHALMIC | Status: DC
  Administered 2017-01-24: 22:00:00 1 [drp] via OPHTHALMIC
  Filled 2017-01-24: qty 5

## 2017-01-24 MED ORDER — AMIODARONE HCL 200 MG PO TABS
100.0000 mg | ORAL_TABLET | Freq: Every day | ORAL | Status: DC
  Administered 2017-01-24 – 2017-01-25 (×2): 100 mg via ORAL
  Filled 2017-01-24 (×2): qty 1

## 2017-01-24 MED ORDER — CLOPIDOGREL BISULFATE 75 MG PO TABS
75.0000 mg | ORAL_TABLET | Freq: Every day | ORAL | Status: DC
  Administered 2017-01-25: 75 mg via ORAL
  Filled 2017-01-24: qty 1

## 2017-01-24 MED ORDER — SODIUM CHLORIDE 0.9 % WEIGHT BASED INFUSION
3.0000 mL/kg/h | INTRAVENOUS | Status: DC
  Administered 2017-01-24: 3 mL/kg/h via INTRAVENOUS

## 2017-01-24 MED ORDER — HYDRALAZINE HCL 20 MG/ML IJ SOLN: 10.0000 mg | INTRAMUSCULAR | Status: DC | PRN

## 2017-01-24 MED ORDER — COLCHICINE 0.6 MG PO TABS: 0.6000 mg | ORAL_TABLET | Freq: Every day | ORAL | Status: DC

## 2017-01-24 MED ORDER — FENTANYL CITRATE (PF) 100 MCG/2ML IJ SOLN
INTRAMUSCULAR | Status: DC | PRN
Start: 2017-01-24 — End: 2017-01-24
  Administered 2017-01-24: 25 ug via INTRAVENOUS

## 2017-01-24 MED ORDER — SODIUM CHLORIDE 0.9 % IV SOLN
INTRAVENOUS | Status: AC
  Administered 2017-01-24: 12:00:00 via INTRAVENOUS

## 2017-01-24 MED ORDER — LIDOCAINE HCL 1 % IJ SOLN
INTRAMUSCULAR | Status: AC
  Filled 2017-01-24: qty 40

## 2017-01-24 MED ORDER — APIXABAN 5 MG PO TABS
5.0000 mg | ORAL_TABLET | Freq: Two times a day (BID) | ORAL | Status: DC
  Administered 2017-01-24 – 2017-01-25 (×2): 5 mg via ORAL
  Filled 2017-01-24 (×2): qty 1

## 2017-01-24 MED ORDER — SODIUM CHLORIDE 0.9% FLUSH: 3.0000 mL | INTRAVENOUS | Status: DC | PRN

## 2017-01-24 MED ORDER — SERTRALINE HCL 50 MG PO TABS
50.0000 mg | ORAL_TABLET | Freq: Every day | ORAL | Status: DC
  Administered 2017-01-24 – 2017-01-25 (×2): 50 mg via ORAL
  Filled 2017-01-24 (×2): qty 1

## 2017-01-24 MED ORDER — MIRTAZAPINE 15 MG PO TBDP
7.5000 mg | ORAL_TABLET | Freq: Every day | ORAL | Status: DC
  Administered 2017-01-24: 7.5 mg via ORAL
  Filled 2017-01-24: qty 0.5

## 2017-01-24 MED ORDER — ATORVASTATIN CALCIUM 40 MG PO TABS
40.0000 mg | ORAL_TABLET | Freq: Every day | ORAL | Status: DC
  Administered 2017-01-24 – 2017-01-25 (×2): 40 mg via ORAL
  Filled 2017-01-24 (×2): qty 1

## 2017-01-24 MED ORDER — POTASSIUM CHLORIDE ER 10 MEQ PO TBCR
10.0000 meq | EXTENDED_RELEASE_TABLET | Freq: Every day | ORAL | Status: DC
  Administered 2017-01-24 – 2017-01-25 (×2): 10 meq via ORAL
  Filled 2017-01-24 (×4): qty 1

## 2017-01-24 MED ORDER — FUROSEMIDE 20 MG PO TABS
20.0000 mg | ORAL_TABLET | Freq: Every day | ORAL | Status: DC
Start: 2017-01-24 — End: 2017-01-25
  Administered 2017-01-24 – 2017-01-25 (×2): 20 mg via ORAL
  Filled 2017-01-24 (×2): qty 1

## 2017-01-24 MED ORDER — HEPARIN SODIUM (PORCINE) 1000 UNIT/ML IJ SOLN
INTRAMUSCULAR | Status: DC | PRN
  Administered 2017-01-24: 7000 [IU] via INTRAVENOUS

## 2017-01-24 MED ORDER — LIDOCAINE HCL (PF) 1 % IJ SOLN
INTRAMUSCULAR | Status: DC | PRN
  Administered 2017-01-24: 25 mL

## 2017-01-24 MED ORDER — SODIUM CHLORIDE 0.9 % WEIGHT BASED INFUSION: 1.0000 mL/kg/h | INTRAVENOUS | Status: DC

## 2017-01-24 MED ORDER — MORPHINE SULFATE (PF) 4 MG/ML IV SOLN: 2.0000 mg | INTRAVENOUS | Status: DC | PRN

## 2017-01-24 MED ORDER — IODIXANOL 320 MG/ML IV SOLN
INTRAVENOUS | Status: DC | PRN
  Administered 2017-01-24: 195 mL via INTRA_ARTERIAL

## 2017-01-24 MED ORDER — MIDAZOLAM HCL 2 MG/2ML IJ SOLN
INTRAMUSCULAR | Status: DC | PRN
  Administered 2017-01-24: 1 mg via INTRAVENOUS

## 2017-01-24 MED ORDER — ENSURE ENLIVE PO LIQD
237.0000 mL | Freq: Two times a day (BID) | ORAL | Status: DC
  Administered 2017-01-25: 10:00:00 237 mL via ORAL
  Filled 2017-01-24: qty 237

## 2017-01-24 MED ORDER — ACETAMINOPHEN 325 MG PO TABS: 650.0000 mg | ORAL_TABLET | ORAL | Status: DC | PRN

## 2017-01-24 MED ORDER — ASPIRIN EC 81 MG PO TBEC
81.0000 mg | DELAYED_RELEASE_TABLET | Freq: Every day | ORAL | Status: DC
  Administered 2017-01-25: 81 mg via ORAL
  Filled 2017-01-24: qty 1

## 2017-01-24 MED ORDER — HEPARIN (PORCINE) IN NACL 2-0.9 UNIT/ML-% IJ SOLN
INTRAMUSCULAR | Status: AC | PRN
  Administered 2017-01-24: 1000 mL

## 2017-01-24 MED ORDER — ASPIRIN 81 MG PO CHEW
81.0000 mg | CHEWABLE_TABLET | ORAL | Status: AC
  Administered 2017-01-24: 81 mg via ORAL

## 2017-01-24 MED ORDER — CLOPIDOGREL BISULFATE 300 MG PO TABS
ORAL_TABLET | ORAL | Status: AC
  Filled 2017-01-24: qty 1

## 2017-01-24 MED ORDER — CLOPIDOGREL BISULFATE 300 MG PO TABS
ORAL_TABLET | ORAL | Status: DC | PRN
Start: 2017-01-24 — End: 2017-01-24
  Administered 2017-01-24: 300 mg via ORAL

## 2017-01-24 MED ORDER — METOPROLOL TARTRATE 25 MG PO TABS
100.0000 mg | ORAL_TABLET | Freq: Two times a day (BID) | ORAL | Status: DC
  Filled 2017-01-24 (×2): qty 4

## 2017-01-24 MED ORDER — ONDANSETRON HCL 4 MG/2ML IJ SOLN: 4.0000 mg | Freq: Four times a day (QID) | INTRAMUSCULAR | Status: DC | PRN

## 2017-01-24 MED ORDER — ONDANSETRON 4 MG PO TBDP
4.0000 mg | ORAL_TABLET | Freq: Four times a day (QID) | ORAL | Status: DC | PRN
  Filled 2017-01-24: qty 1

## 2017-01-24 MED ORDER — FENTANYL CITRATE (PF) 100 MCG/2ML IJ SOLN
INTRAMUSCULAR | Status: AC
  Filled 2017-01-24: qty 2

## 2017-01-24 MED ORDER — HEPARIN SODIUM (PORCINE) 1000 UNIT/ML IJ SOLN
INTRAMUSCULAR | Status: AC
  Filled 2017-01-24: qty 1

## 2017-01-24 MED ORDER — ANGIOPLASTY BOOK
Freq: Once | Status: AC
  Administered 2017-01-24: 20:00:00
  Filled 2017-01-24: qty 1

## 2017-01-24 MED ORDER — ASPIRIN 81 MG PO CHEW
CHEWABLE_TABLET | ORAL | Status: AC
  Administered 2017-01-24: 81 mg via ORAL
  Filled 2017-01-24: qty 1

## 2017-01-24 SURGICAL SUPPLY — 19 items
BALLN IN.PACT DCB 5X80 (BALLOONS) ×3
CATH ANGIO 5F PIGTAIL 65CM (CATHETERS) ×1 IMPLANT
CATH CROSS OVER TEMPO 5F (CATHETERS) ×1 IMPLANT
CATH HAWKONE LX EXTENDED TIP (CATHETERS) ×1 IMPLANT
DCB IN.PACT 5X80 (BALLOONS) IMPLANT
DEVICE SPIDERFX EMB PROT 6MM (WIRE) ×1 IMPLANT
GUIDEWIRE ANGLED .035X150CM (WIRE) ×1 IMPLANT
KIT PV (KITS) ×3 IMPLANT
LUBRICANT VIPERSLIDE CORONARY (MISCELLANEOUS) ×1 IMPLANT
SHEATH HIGHFLEX ANSEL 7FR 55CM (SHEATH) ×1 IMPLANT
SHEATH PINNACLE 5F 10CM (SHEATH) ×1 IMPLANT
SHEATH PINNACLE 7F 10CM (SHEATH) ×1 IMPLANT
SYRINGE MEDRAD AVANTA MACH 7 (SYRINGE) ×1 IMPLANT
TAPE RADIOPAQUE TURBO (MISCELLANEOUS) ×1 IMPLANT
TRANSDUCER W/STOPCOCK (MISCELLANEOUS) ×3 IMPLANT
TRAY PV CATH (CUSTOM PROCEDURE TRAY) ×3 IMPLANT
WIRE HITORQ VERSACORE ST 145CM (WIRE) ×1 IMPLANT
WIRE ROSEN-J .035X180CM (WIRE) ×1 IMPLANT
WIRE SPARTACORE .014X300CM (WIRE) ×1 IMPLANT

## 2017-01-24 NOTE — Interval H&P Note (Signed)
History and Physical Interval Note:  01/24/2017 9:30 AM  Gregory [name]  has presented today for surgery, with the diagnosis of pad  The various methods of treatment have been discussed with the patient and family. After consideration of risks, benefits and other options for treatment, the patient has consented to  Procedure(s): Lower Extremity Angiography - turbohawk (N/A) as a surgical intervention .  The patient's history has been reviewed, patient examined, no change in status, stable for surgery.  I have reviewed the patient's chart and labs.  Questions were answered to the patient's satisfaction.     [name]

## 2017-01-24 NOTE — Progress Notes (Signed)
Site area: right groin  Site Prior to Removal:  Level 0  Pressure Applied For 20 MINUTES    Minutes Beginning at 1515  Manual:   Yes.    Patient Status During Pull:  AAO X 4  Post Pull Groin Site:  Level 0  Post Pull Instructions Given:  Yes.    Post Pull Pulses Present:  Yes.    Dressing Applied:  Yes.    Comments:  TOLERATED PROCEDURE WELL

## 2017-01-24 NOTE — H&P (View-Only) (Signed)
01/11/2017 Gregory [name]   19-Oct-1929  [id number]  Primary Physician Gregory Spurling, MD Primary Cardiologist: Gregory Harp MD Gregory [name]  HPI:  Gregory [name] is a pleasant 83 year old male fell appearing African-American male followed by Dr. Claiborne [name] history of ischemic heart disease status post STEMI 8/17 with moderate CAD treated medically. He also has a history of PAF on Eliquis,  hypertension, hyperlipidemia and chronic diastolic heart failure. He has asymmetric lower extremity edema right greater than left. He also has a nonhealing ulcer on his left heel which she's had for last 3 months. Lower extremity until Doppler studies performed by/11/18 revealed a right ABI of 0.91 and left of 0.95. He did have a high-frequency signal in his distal left SFA. He's also been referred to the wound care center for aggressive local wound care.   Current Outpatient Prescriptions  Medication Sig Dispense Refill  . amiodarone (PACERONE) 100 MG tablet Take 1 tablet (100 mg total) by mouth daily. 90 tablet 1  . apixaban (ELIQUIS) 5 MG TABS tablet Take 1 tablet (5 mg total) by mouth 2 (two) times daily. 60 tablet 0  . atorvastatin (LIPITOR) 40 MG tablet Take 1 tablet (40 mg total) by mouth daily. 30 tablet 0  . brimonidine (ALPHAGAN) 0.2 % ophthalmic solution Place 1 drop into both eyes at bedtime. 5 mL 0  . colchicine 0.6 MG tablet Take 1 tablet (0.6 mg total) by mouth daily.    . feeding supplement, ENSURE ENLIVE, (ENSURE ENLIVE) LIQD Take 237 mLs by mouth 2 (two) times daily between meals. 60 Bottle 0  . furosemide (LASIX) 20 MG tablet Take 1 tablet (20 mg total) by mouth daily. FOR EDEMA 30 tablet 3  . metoprolol (LOPRESSOR) 100 MG tablet Take 1 tablet (100 mg total) by mouth 2 (two) times daily. 60 tablet 0  . mirtazapine (REMERON SOL-TAB) 15 MG disintegrating tablet Take 0.5 tablets (7.5 mg total) by mouth at bedtime. 30 tablet 0  . Multiple Vitamin (MULTIVITAMIN WITH MINERALS)  TABS tablet Take 1 tablet by mouth daily.    . ondansetron (ZOFRAN-ODT) 4 MG disintegrating tablet Take 1 tablet (4 mg total) by mouth every 6 (six) hours as needed for nausea. (Patient taking differently: Take 4 mg by mouth every 6 (six) hours as needed for nausea. DISSOLVE IN THE MOUTH) 20 tablet 0  . pantoprazole (PROTONIX) 40 MG tablet Take 1 tablet (40 mg total) by mouth 2 (two) times daily. 60 tablet 0  . potassium chloride (K-DUR) 10 MEQ tablet Take 1 tablet (10 mEq total) by mouth daily. 30 tablet 3  . sertraline (ZOLOFT) 50 MG tablet Take 1 tablet (50 mg total) by mouth daily. 30 tablet 0   No current facility-administered medications for this visit.     Allergies  Allergen Reactions  . Sulfa Antibiotics     Reaction not noted on MAR  . Ramipril     Reaction not noted on MAR  . Ramipril Other (See Comments)    Reaction not noted on MAR  . Sulfamethoxazole Rash    Social History   Social History  . Marital status: Divorced    Spouse name: N/A  . Number of children: N/A  . Years of education: N/A   Occupational History  . Not on file.   Social History Main Topics  . Smoking status: Never Smoker  . Smokeless tobacco: Never Used  . Alcohol use No     Comment: 08/05/2016 "quit drinking  in the 1970s"  . Drug use: No  . Sexual activity: Yes   Other Topics Concern  . Not on file   Social History Narrative  . No narrative on file     Review of Systems: General: negative for chills, fever, night sweats or weight changes.  Cardiovascular: negative for chest pain, dyspnea on exertion, edema, orthopnea, palpitations, paroxysmal nocturnal dyspnea or shortness of breath Dermatological: negative for rash Respiratory: negative for cough or wheezing Urologic: negative for hematuria Abdominal: negative for nausea, vomiting, diarrhea, bright red blood per rectum, melena, or hematemesis Neurologic: negative for visual changes, syncope, or dizziness All other systems reviewed  and are otherwise negative except as noted above.    Blood pressure 124/69, pulse (!) 57, height 5\' 11"  (1.803 m), weight 144 lb 9.6 oz (65.6 kg), SpO2 99 %.  General appearance: alert and no distress Neck: no adenopathy, no carotid bruit, no JVD, supple, symmetrical, trachea midline and thyroid not enlarged, symmetric, no tenderness/mass/nodules Lungs: clear to auscultation bilaterally Heart: regular rate and rhythm, S1, S2 normal, no murmur, click, rub or gallop Extremities: 23+ right lower extremity pitting edema, small nonhealing ulcer left heel  EKG not performed today  ASSESSMENT AND PLAN:   Hypertension History of hypertension blood pressure measured 122/82. She is on atenolol. Continue current meds at current dosing  Hyperlipidemia History of hyperlipidemia not on statin therapy followed by her PCP  Critical lower limb ischemia Gregory [name] was referred to me by Dr. Claiborne [name] and Gregory Deforest PA-C as well as the wound care center for critical limb ischemia. He has had a nonhealing left heel wound for several months. He also has asymmetric edema right greater than left on diuretics. He had repeat recent Dopplers in our office revealing normal ABIs however he had a high-frequency signal in his distal left SFA. This may be limiting blood flow to his left foot and this healing of his left heel ulcer. He does have normal renal function. He is on Eliquis  oral anticoagulation for atrial fibrillation which we will stop 3 days prior to his angiogram and potential left SFA intervention.      Gregory Harp MD FACP,FACC,FAHA, [facility] 01/11/2017 9:57 AM

## 2017-01-24 NOTE — Care Management Note (Signed)
Case Management Note  Patient Details  Name: OLUWADARASIMI REDMON MRN: [id number] Date of Birth: [birth year]  Subjective/Objective:    From home , s/p successful pv intervention, will be on  plavix , asa and eliquis which he was already taking.    PCP Jeanann Lewandowsky                Action/Plan: NCM will follow for dc needs.  Expected Discharge Date:                  Expected Discharge Plan:     In-House Referral:     Discharge planning Services  CM Consult  Post Acute Care Choice:    Choice offered to:     DME Arranged:    DME Agency:     HH Arranged:    HH Agency:     Status of Service:  In process, will continue to follow  If discussed at Long Length of Stay Meetings, dates discussed:    Additional Comments:  Zenon Mayo, RN 01/24/2017, 11:40 AM

## 2017-01-24 NOTE — Discharge Instructions (Addendum)

## 2017-01-25 ENCOUNTER — Telehealth: Payer: Self-pay | Admitting: Cardiovascular Disease

## 2017-01-25 DIAGNOSIS — I739 Peripheral vascular disease, unspecified: Secondary | ICD-10-CM

## 2017-01-25 DIAGNOSIS — I70244 Atherosclerosis of native arteries of left leg with ulceration of heel and midfoot: Secondary | ICD-10-CM | POA: Diagnosis not present

## 2017-01-25 LAB — CBC
HCT: 31 % — ABNORMAL LOW (ref 39.0–52.0)
HEMOGLOBIN: 10.1 g/dL — AB (ref 13.0–17.0)
MCH: 28.3 pg (ref 26.0–34.0)
MCHC: 32.6 g/dL (ref 30.0–36.0)
MCV: 86.8 fL (ref 78.0–100.0)
Platelets: 103 10*3/uL — ABNORMAL LOW (ref 150–400)
RBC: 3.57 MIL/uL — AB (ref 4.22–5.81)
RDW: 16.6 % — ABNORMAL HIGH (ref 11.5–15.5)
WBC: 5.5 10*3/uL (ref 4.0–10.5)

## 2017-01-25 LAB — BASIC METABOLIC PANEL
ANION GAP: 5 (ref 5–15)
BUN: 19 mg/dL (ref 6–20)
CHLORIDE: 107 mmol/L (ref 101–111)
CO2: 25 mmol/L (ref 22–32)
CREATININE: 0.93 mg/dL (ref 0.61–1.24)
Calcium: 8.4 mg/dL — ABNORMAL LOW (ref 8.9–10.3)
GFR calc Af Amer: >60 mL/min (ref >60)
GFR calc non Af Amer: >60 mL/min (ref >60)
GLUCOSE: 116 mg/dL — AB (ref 65–99)
Potassium: 3.3 mmol/L — ABNORMAL LOW (ref 3.5–5.1)
Sodium: 137 mmol/L (ref 135–145)

## 2017-01-25 MED ORDER — CLOPIDOGREL BISULFATE 75 MG PO TABS: 75.0000 mg | ORAL_TABLET | Freq: Every day | ORAL | 11 refills | Status: DC

## 2017-01-25 MED ORDER — ASPIRIN 81 MG PO TBEC: 81.0000 mg | DELAYED_RELEASE_TABLET | Freq: Every day | ORAL | 11 refills | Status: AC

## 2017-01-25 NOTE — Telephone Encounter (Signed)
Patient has appointment to see Dr [name] for post hospital visit July 6th. Maybe they can send orders to him.

## 2017-01-25 NOTE — Progress Notes (Signed)
Progress Note  Patient Name: Gregory [name] Date of Encounter: 01/25/2017  Primary Cardiologist: [name] PV: [name]  Subjective   DAy 1 s/p PV angio and  left SFA Hawk one directional atherectomy followed by drug eluding balloon angioplasty of high-grade calcified mid left SFA stenosis with 1 vessel runoff in the setting of critical limb ischemia. No CP or SOB.  Inpatient Medications    Scheduled Meds: . amiodarone  100 mg Oral Daily  . apixaban  5 mg Oral BID  . aspirin EC  81 mg Oral Daily  . atorvastatin  40 mg Oral Daily  . brimonidine  1 drop Both Eyes QHS  . clopidogrel  75 mg Oral Q breakfast  . feeding supplement (ENSURE ENLIVE)  237 mL Oral BID BM  . furosemide  20 mg Oral Daily  . metoprolol tartrate  100 mg Oral BID  . mirtazapine  7.5 mg Oral QHS  . pantoprazole  40 mg Oral BID  . potassium chloride  10 mEq Oral Daily  . sertraline  50 mg Oral Daily   Continuous Infusions:  PRN Meds: acetaminophen, hydrALAZINE, morphine injection, ondansetron (ZOFRAN) IV, ondansetron   Vital Signs    Vitals:   01/24/17 2300 01/24/17 2311 01/24/17 2335 01/25/17 0627  BP: (!) 75/46 (!) 79/42 (!) 127/44 (!) 116/56  Pulse:    (!) 54  Resp: (!) 22 (!) 23 19 (!) 21  Temp:    97.5 F (36.4 C)  TempSrc:    Oral  SpO2:    97%  Weight:    119 lb 0.8 oz (54 kg)  Height:        Intake/Output Summary (Last 24 hours) at 01/25/17 0829 Last data filed at 01/25/17 0644  Gross per 24 hour  Intake           1342.5 ml  Output             2350 ml  Net          -1007.5 ml    I/O since admission:  -1007  Filed Weights   01/24/17 0615 01/25/17 0627  Weight: 120 lb (54.4 kg) 119 lb 0.8 oz (54 kg)    Telemetry    Sinus Bradyucardia at 55 - Personally Reviewed  ECG    ECG (independently read by me):  Not done during admission  Physical Exam   BP (!) 116/56   Pulse (!) 54   Temp 97.5 F (36.4 C) (Oral)   Resp (!) 21   Ht 5\' 11"  (1.803 m)   Wt 119 lb 0.8 oz (54 kg)    SpO2 97%   BMI 16.60 kg/m  General: Alert, oriented, no distress.  Skin: normal turgor, no rashes, warm and dry HEENT: Normocephalic, atraumatic. Pupils equal round and reactive to light; sclera anicteric; extraocular muscles intact;  Nose without nasal septal hypertrophy Mouth/Parynx benign; Mallinpatti scale 3 Neck: No JVD, no carotid bruits; normal carotid upstroke Lungs: clear to ausculatation and percussion; no wheezing or rales Chest wall: without tenderness to palpitation Heart: PMI not displaced, RRR, s1 s2 normal, 1/6 systolic murmur, no diastolic murmur, no rubs, gallops, thrills, or heaves Abdomen: soft, nontender; no hepatosplenomehaly, BS+; abdominal aorta nontender and not dilated by palpation. Back: no CVA tenderness Pulses 2+;  R groin site stable Musculoskeletal: full range of motion, normal strength, no joint deformities Extremities:  Icyhthiosisl; no clubbing cyanosis or edema, Homan's sign negative  Left leg warm Neurologic: grossly nonfocal; Cranial nerves grossly wnl Psychologic:  Normal mood and affect   Labs    Chemistry Recent Labs Lab 01/25/17 0309  NA 137  K 3.3*  CL 107  CO2 25  GLUCOSE 116*  BUN 19  CREATININE 0.93  CALCIUM 8.4*  GFRNONAA >60  GFRAA >60  ANIONGAP 5     Hematology Recent Labs Lab 01/25/17 0309  WBC 5.5  RBC 3.57*  HGB 10.1*  HCT 31.0*  MCV 86.8  MCH 28.3  MCHC 32.6  RDW 16.6*  PLT 103*    Cardiac EnzymesNo results for input(s): TROPONINI in the last 168 hours. No results for input(s): TROPIPOC in the last 168 hours.   BNPNo results for input(s): BNP, PROBNP in the last 168 hours.   DDimer No results for input(s): DDIMER in the last 168 hours.   Lipid Panel     Component Value Date/Time   CHOL 85 12020-05-1816 0825   TRIG 28 12020-05-1816 0825   HDL 46 12020-05-1816 0825   CHOLHDL 1.8 12020-05-1816 0825   VLDL 6 12020-05-1816 0825   LDLCALC 33 12020-05-1816 0825    Radiology    No results found.  Cardiac Studies    Angiographic Data:   1: Abdominal aorta-free of significant disease 2: Left lower extremity-90% segmental calcified mid left SFA, 50% P2 segment left popliteal artery, one vessel runoff via the peroneal 3: Right lower extremity-50-60% segmental calcified mid right SFA with 0 vessel runoff below the knee  Final Impression: Successful left SFA Hawk one directional atherectomy followed by drug eluding balloon angioplasty of high-grade calcified mid left SFA stenosis with 1 vessel runoff in the setting of critical limb ischemia. The sheath will be removed once the AST falls below 170 and pressure held. The patient will be treated with dual antiplatelet therapy in addition to his Eliquis for one month after which the aspirin will be discontinued. He'll be discharged home in the morning and will obtain lower extremity arterial Doppler studies in our [place] office next week. I will see him back in 2-3 weeks thereafter. He left the lab in stable condition.   Patient Profile     Mr. Craton is a pleasant 83 year old  male with ischemic heart disease status post STEMI 8/17 with moderate CAD treated medically. He also has a history of PAF on Eliquis,  hypertension, hyperlipidemia and chronic diastolic heart failure and was admitted for PV angio to evaluate critical limb ischemia.  Assessment & Plan    1. PVD: s/p PV angio and  left SFA Hawk one directional atherectomy followed by drug eluding balloon angioplasty of high-grade calcified mid left SFA stenosis with 1 vessel runoff in the setting of critical limb ischemia. Left leg warm; feels better  2. CAD; no angina  3. PAF with [id number] score of 6;  To resume eliquis today with low dose ASA/Plavix. Maintaining SB at 55 on amiodarone, metoprolol.   4. Chronic diastolic heart failure.  5. HTN: stable BP on meds  DC today with f/u Dr. [name].  Signed, Troy Sine, MD, [facility] 01/25/2017, 8:29 AM

## 2017-01-25 NOTE — Discharge Summary (Signed)
Discharge Summary    Patient ID: Gregory [name],  MRN: [id number], DOB/AGE: July 03, 1930 83 y.o.  Admit date: 01/24/2017 Discharge date: 01/25/2017  Primary Care Provider: [name] Primary Cardiologist: Dr. [name] PV: Dr. [name]   Discharge Diagnoses    Active Problems:   Critical lower limb ischemia   Allergies Allergies  Allergen Reactions  . Sulfa Antibiotics Rash  . Ramipril     unknown     History of Present Illness     83 year old male with past medical history of ischemic heart disease s/p STEMI 03/2016 with moderate CAD treated medically, PAF on Eliquis, hypertension, hyperlipidemia, and chronic diastolic heart failure.; He presented to [facility] for admission for peripheral vascular angiography and intervention on 01/24/2017 for critical lower limb ischemia.   Hospital Course     Consultants: None   His vital signs remained stable. His oxygen saturation remained stable on room air. He was afebrile.  PV Angiogram/Invervention revealed left lower extremity 90 % segmental calcified mid left SFA, 50% P2 segment left popliteal and underwent a successful left SFA Hawk one directional atherectomy followed by DES balloon angioplasty of high-grade calcified mid left SFA stenosis with 1 vessel runoff. The plan is for him to be treated with dual antiplatelet therapy (low dose Plavix and ASA) in addition to his Eliquis for one month which after the the ASA can be discontinued. CHADVASC score pf 5 (age, hypertension, CHF, vascular disease). Continue Metoprolol and Amiodarone. He will go for a lower extremity arterial doppler study at the [facility] office next week and Dr. [name] will see him in the office 2-3 weeks from the date of his doppler study.  The patient has had an uncomplicated hospital course and is recovering well. The right groin site is stable. He has been seen by Dr. [name] today and deemed ready for discharge home. His left leg is warm and feels  better.  All follow-up appointments have been scheduled. Smoking cessation was disscussed in length. A written RX for a 30 day free supply of Brilinta was provided for the patient. A work excuse note was provided as well. Discharge medications are listed below.  _____________  Discharge Vitals Blood pressure (!) 108/54, pulse (!) 55, temperature 98 F (36.7 C), temperature source Oral, resp. rate (!) 21, height 5\' 11"  (1.803 m), weight 119 lb 0.8 oz (54 kg), SpO2 97 %.  Filed Weights   01/24/17 0615 01/25/17 0627  Weight: 120 lb (54.4 kg) 119 lb 0.8 oz (54 kg)    Labs & Radiologic Studies     CBC  Recent Labs  01/25/17 0309  WBC 5.5  HGB 10.1*  HCT 31.0*  MCV 86.8  PLT 270*   Basic Metabolic Panel  Recent Labs  01/25/17 0309  NA 137  K 3.3*  CL 107  CO2 25  GLUCOSE 116*  BUN 19  CREATININE 0.93  CALCIUM 8.4*   Liver Function Tests No results for input(s): AST, ALT, ALKPHOS, BILITOT, PROT, ALBUMIN in the last 72 hours. No results for input(s): LIPASE, AMYLASE in the last 72 hours. Cardiac Enzymes No results for input(s): CKTOTAL, CKMB, CKMBINDEX, TROPONINI in the last 72 hours. BNP Invalid input(s): POCBNP D-Dimer No results for input(s): DDIMER in the last 72 hours. Hemoglobin A1C No results for input(s): HGBA1C in the last 72 hours. Fasting Lipid Panel No results for input(s): CHOL, HDL, LDLCALC, TRIG, CHOLHDL, LDLDIRECT in the last 72 hours. Thyroid Function Tests No results  for input(s): TSH, T4TOTAL, T3FREE, THYROIDAB in the last 72 hours.  Invalid input(s): FREET3  Dg Chest 2 View  Result Date: 01/20/2017 CLINICAL DATA:  History of coronary artery disease. Preoperative study prior to procedure for left lower limb ischemia. No current chest complaints. EXAM: CHEST  2 VIEW COMPARISON:  Chest x-ray of August 08, 2016 FINDINGS: The lungs are well-expanded. The left pleural effusion has nearly totally resolved. The cardiac silhouette is top-normal in size.  The pulmonary vascularity is normal. There is calcification in the wall of the aortic arch. The mediastinum is normal in width. There is multilevel degenerative disc disease of the thoracic spine. IMPRESSION: Persistent small left pleural effusion. No pneumonia, pulmonary edema, nor other acute cardiopulmonary abnormality. Thoracic aortic atherosclerosis. Electronically Signed   By: David  Martinique M.D.   On: 01/20/2017 13:55     Diagnostic Studies/Procedures    Angiographic Data:   1: Abdominal aorta-free of significant disease 2: Left lower extremity-90% segmental calcified mid left SFA, 50% P2 segment left popliteal artery, one vessel runoff via the peroneal 3: Right lower extremity-50-60% segmental calcified mid right SFA with 0 vessel runoff below the knee  Final Impression:Successful left SFA Hawk one directional atherectomy followed by drug eluding balloon angioplasty of high-grade calcified mid left SFA stenosis with 1 vessel runoff in the setting of critical limb ischemia. The sheath will be removed once the AST falls below 170 and pressure held. The patient will be treated with dual antiplatelet therapy in addition to his Eliquis for one month after which the aspirin will be discontinued. He'll be discharged home in the morning and will obtain lower extremity arterial Doppler studies in our [place] office next week. I will see him back in 2-3 weeks thereafter. He left the lab in stable condition. _____________    Disposition   Pt is being discharged home today in good condition.  Follow-up Plans & Appointments    Follow-up Information    [facility]. Go on 01/31/2017.   Specialty:  Cardiology Why:  For dopplers of your legs @ 1:30pm Contact information: [address] [place] [phone]       Lorretta Harp, MD. Go on 02/18/2017.   Specialties:  Cardiology, Radiology Why:  @ 10:45 am.  Contact information: [address] [place] [address] [phone]          Discharge Instructions    Diet - low sodium heart healthy    Complete by:  As directed    Increase activity slowly    Complete by:  As directed    May shower / Bathe    Complete by:  As directed       Discharge Medications   Allergies as of 01/25/2017      Reactions   Sulfa Antibiotics Rash   Ramipril    unknown      Medication List    TAKE these medications   amiodarone 100 MG tablet Commonly known as:  PACERONE Take 1 tablet (100 mg total) by mouth daily.   apixaban 5 MG Tabs tablet Commonly known as:  ELIQUIS Take 1 tablet (5 mg total) by mouth 2 (two) times daily.   aspirin 81 MG EC tablet Take 1 tablet (81 mg total) by mouth daily. Start taking on:  01/26/2017   atorvastatin 40 MG tablet Commonly known as:  LIPITOR Take 1 tablet (40 mg total) by mouth daily.   brimonidine 0.2 % ophthalmic solution Commonly  known as:  ALPHAGAN Place 1 drop into both eyes at bedtime.   clopidogrel 75 MG tablet Commonly known as:  PLAVIX Take 1 tablet (75 mg total) by mouth daily with breakfast. Start taking on:  01/26/2017   colchicine 0.6 MG tablet Take 1 tablet (0.6 mg total) by mouth daily.   feeding supplement (ENSURE ENLIVE) Liqd Take 237 mLs by mouth 2 (two) times daily between meals.   furosemide 20 MG tablet Commonly known as:  LASIX Take 1 tablet (20 mg total) by mouth daily. FOR EDEMA   metoprolol tartrate 100 MG tablet Commonly known as:  LOPRESSOR Take 1 tablet (100 mg total) by mouth 2 (two) times daily.   mirtazapine 15 MG disintegrating tablet Commonly known as:  REMERON SOL-TAB Take 0.5 tablets (7.5 mg total) by mouth at bedtime.   multivitamin with minerals Tabs tablet Take 1 tablet by mouth daily.   ondansetron 4 MG disintegrating tablet Commonly known as:  ZOFRAN-ODT Take 1 tablet (4 mg total) by mouth every 6 (six) hours as needed for nausea. What changed:  additional  instructions   pantoprazole 40 MG tablet Commonly known as:  PROTONIX Take 1 tablet (40 mg total) by mouth 2 (two) times daily.   potassium chloride 10 MEQ tablet Commonly known as:  K-DUR Take 1 tablet (10 mEq total) by mouth daily.   sertraline 50 MG tablet Commonly known as:  ZOLOFT Take 1 tablet (50 mg total) by mouth daily.          Outstanding Labs/Studies   Lower extremity doppler study's.  Duration of Discharge Encounter   Greater than 30 minutes including physician time.  Kristopher Glee PA-C 01/25/2017, 12:36 PM

## 2017-01-25 NOTE — Telephone Encounter (Signed)
Notified Edwena Felty of [place] advice. She will try to contact patient's PCP for orders

## 2017-01-25 NOTE — Telephone Encounter (Signed)
Edwena Felty with [facility], calling for verbal orders to resume nursing services with patient. Thanks.

## 2017-01-25 NOTE — Telephone Encounter (Signed)
Called [place] with [facility]. Patient was discharged yesterday and needs orders for skilled nursing. Advised that it is noted in patient's chart that Dr. [name] does not to [facility] orders for nursing care related to his foot ulcer and Edwena Felty could not elaborate on what the specifics of this order request were, just that Dr. Evette Georges name was attached to the order. Advised I would have to send the message to Dr. [name] and Mariann Laster for follow up. If orders OK'ed, can call w/verbal. Fax: [phone]

## 2017-01-25 NOTE — Care Management Note (Signed)
Case Management Note  Patient Details  Name: ADITHYA DIFRANCESCO MRN: [id number] Date of Birth: 1929/10/10  Subjective/Objective:   From home alone, pta indep , s/p successful pv intervention, will be on  plavix , asa and eliquis which he was already taking.  For dc today, no needs.  His nephew will be transporting him home.  PCP Jeanann Lewandowsky                              Action/Plan: NCM will follow for dc needs.  Expected Discharge Date:                  Expected Discharge Plan:  Home/Self Care  In-House Referral:     Discharge planning Services  CM Consult  Post Acute Care Choice:    Choice offered to:     DME Arranged:    DME Agency:     HH Arranged:    [place] Agency:     Status of Service:  Completed, signed off  If discussed at [employer] of Stay Meetings, dates discussed:    Additional Comments:  Zenon Mayo, RN 01/25/2017, 10:56 AM

## 2017-01-26 ENCOUNTER — Encounter (HOSPITAL_BASED_OUTPATIENT_CLINIC_OR_DEPARTMENT_OTHER): Payer: Medicare Other | Attending: Surgery

## 2017-01-26 ENCOUNTER — Other Ambulatory Visit: Payer: Self-pay | Admitting: Cardiovascular Disease

## 2017-01-26 DIAGNOSIS — I4891 Unspecified atrial fibrillation: Secondary | ICD-10-CM | POA: Diagnosis not present

## 2017-01-26 DIAGNOSIS — Z515 Encounter for palliative care: Secondary | ICD-10-CM | POA: Insufficient documentation

## 2017-01-26 DIAGNOSIS — L89623 Pressure ulcer of left heel, stage 3: Secondary | ICD-10-CM | POA: Diagnosis not present

## 2017-01-26 DIAGNOSIS — I5032 Chronic diastolic (congestive) heart failure: Secondary | ICD-10-CM | POA: Diagnosis not present

## 2017-01-26 DIAGNOSIS — Z79899 Other long term (current) drug therapy: Secondary | ICD-10-CM | POA: Insufficient documentation

## 2017-01-26 DIAGNOSIS — I252 Old myocardial infarction: Secondary | ICD-10-CM | POA: Diagnosis not present

## 2017-01-26 DIAGNOSIS — I890 Lymphedema, not elsewhere classified: Secondary | ICD-10-CM | POA: Diagnosis not present

## 2017-01-26 DIAGNOSIS — I739 Peripheral vascular disease, unspecified: Secondary | ICD-10-CM

## 2017-01-26 DIAGNOSIS — L97221 Non-pressure chronic ulcer of left calf limited to breakdown of skin: Secondary | ICD-10-CM | POA: Diagnosis not present

## 2017-01-26 DIAGNOSIS — Z66 Do not resuscitate: Secondary | ICD-10-CM | POA: Diagnosis not present

## 2017-01-26 DIAGNOSIS — I110 Hypertensive heart disease with heart failure: Secondary | ICD-10-CM | POA: Diagnosis not present

## 2017-01-26 LAB — POCT ACTIVATED CLOTTING TIME
Activated Clotting Time: 180 seconds
Activated Clotting Time: 169 seconds

## 2017-01-31 ENCOUNTER — Ambulatory Visit (HOSPITAL_COMMUNITY)
Admission: RE | Admit: 2017-01-31 | Discharge: 2017-01-31 | Disposition: A | Discharge status: Home / Self Care | Payer: Medicare Other | Source: Ambulatory Visit | Attending: Cardiovascular Disease | Admitting: Cardiovascular Disease

## 2017-01-31 DIAGNOSIS — I70292 Other atherosclerosis of native arteries of extremities, left leg: Secondary | ICD-10-CM | POA: Insufficient documentation

## 2017-01-31 DIAGNOSIS — I739 Peripheral vascular disease, unspecified: Secondary | ICD-10-CM | POA: Diagnosis not present

## 2017-02-02 DIAGNOSIS — L89623 Pressure ulcer of left heel, stage 3: Secondary | ICD-10-CM | POA: Diagnosis not present

## 2017-02-09 DIAGNOSIS — L89623 Pressure ulcer of left heel, stage 3: Secondary | ICD-10-CM | POA: Diagnosis not present

## 2017-02-18 ENCOUNTER — Encounter: Payer: Self-pay | Admitting: Cardiovascular Disease

## 2017-02-18 ENCOUNTER — Ambulatory Visit (INDEPENDENT_AMBULATORY_CARE_PROVIDER_SITE_OTHER): Payer: Medicare Other | Admitting: Cardiovascular Disease

## 2017-02-18 VITALS — BP 115/68 | HR 51 | Ht 71.0 in | Wt 144.0 lb

## 2017-02-18 DIAGNOSIS — I998 Other disorder of circulatory system: Secondary | ICD-10-CM

## 2017-02-18 DIAGNOSIS — I10 Essential (primary) hypertension: Secondary | ICD-10-CM

## 2017-02-18 DIAGNOSIS — I70229 Atherosclerosis of native arteries of extremities with rest pain, unspecified extremity: Secondary | ICD-10-CM

## 2017-02-18 NOTE — Patient Instructions (Signed)
Medication Instructions: Your physician recommends that you continue on your current medications as directed. Please refer to the Current Medication list given to you today.'   Testing/Procedures: Your physician has requested that you have a lower extremity arterial duplex. During this test, ultrasound is used to evaluate arterial blood flow in the legs. Allow one hour for this exam. There are no restrictions or special instructions.  Your physician has requested that you have an ankle brachial index (ABI). During this test an ultrasound and blood pressure cuff are used to evaluate the arteries that supply the arms and legs with blood. Allow thirty minutes for this exam. There are no restrictions or special instructions.  (We will repeat these every 6 months)   Follow-Up: Your physician recommends that you schedule a follow-up appointment as needed with Dr. [name].

## 2017-02-18 NOTE — Progress Notes (Signed)
02/18/2017 RAYKWON HOBBS   08-Dec-1929  [id number]  Primary Physician [name], MD Primary Cardiologist: Lorretta Harp MD Renae Gloss  HPI:  Mr. Longton is a pleasant 83 year old male fell appearing African-American male followed by Dr. [name] . I last saw him in the office 01/11/17. He has a history of ischemic heart disease status post STEMI 8/17 with moderate CAD treated medically. He also has a history of PAF on Eliquis, hypertension, hyperlipidemia and chronic diastolic heart failure. He has asymmetric lower extremity edema right greater than left. He also has a nonhealing ulcer on his left heel which she's had for last 3 months. Lower extremity until Doppler studies performed by/11/18 revealed a right ABI of 0.91 and left of 0.95. He did have a high-frequency signal in his distal left SFA. He's also been referred to the wound care center for aggressive local wound care. He underwent lower extremity angiography by myself 01/24/17 because of a nonhealing wound on his left heel consistent with critical limb ischemia. I performed [facility]  1 directional Atherectomy followed by drug-eluting balloon angioplasty of a 90% segmental mid left SFA stenosis. He had 1 vessel runoff below the knee via peroneal artery. Follow-up Dopplers performed 01/31/17 revealed a widely patent SFA with a left ABI 1.1. His left heel wound has subsequently healed.   Current Outpatient Prescriptions  Medication Sig Dispense Refill  . amiodarone (PACERONE) 100 MG tablet Take 1 tablet (100 mg total) by mouth daily. 90 tablet 1  . apixaban (ELIQUIS) 5 MG TABS tablet Take 1 tablet (5 mg total) by mouth 2 (two) times daily. 60 tablet 0  . aspirin EC 81 MG EC tablet Take 1 tablet (81 mg total) by mouth daily. 30 tablet 11  . atorvastatin (LIPITOR) 40 MG tablet Take 1 tablet (40 mg total) by mouth daily. 30 tablet 0  . brimonidine (ALPHAGAN) 0.2 % ophthalmic solution Place 1 drop into both eyes at bedtime.  5 mL 0  . clopidogrel (PLAVIX) 75 MG tablet Take 1 tablet (75 mg total) by mouth daily with breakfast. 30 tablet 11  . colchicine 0.6 MG tablet Take 1 tablet (0.6 mg total) by mouth daily.    . feeding supplement, ENSURE ENLIVE, (ENSURE ENLIVE) LIQD Take 237 mLs by mouth 2 (two) times daily between meals. 60 Bottle 0  . furosemide (LASIX) 20 MG tablet Take 1 tablet (20 mg total) by mouth daily. FOR EDEMA 30 tablet 3  . metoprolol (LOPRESSOR) 100 MG tablet Take 1 tablet (100 mg total) by mouth 2 (two) times daily. 60 tablet 0  . mirtazapine (REMERON SOL-TAB) 15 MG disintegrating tablet Take 0.5 tablets (7.5 mg total) by mouth at bedtime. 30 tablet 0  . Multiple Vitamin (MULTIVITAMIN WITH MINERALS) TABS tablet Take 1 tablet by mouth daily.    . ondansetron (ZOFRAN-ODT) 4 MG disintegrating tablet Take 1 tablet (4 mg total) by mouth every 6 (six) hours as needed for nausea. (Patient taking differently: Take 4 mg by mouth every 6 (six) hours as needed for nausea. DISSOLVE IN THE MOUTH) 20 tablet 0  . pantoprazole (PROTONIX) 40 MG tablet Take 1 tablet (40 mg total) by mouth 2 (two) times daily. 60 tablet 0  . potassium chloride (K-DUR) 10 MEQ tablet Take 1 tablet (10 mEq total) by mouth daily. 30 tablet 3  . sertraline (ZOLOFT) 50 MG tablet Take 1 tablet (50 mg total) by mouth daily. 30 tablet 0   No current facility-administered medications  for this visit.     Allergies  Allergen Reactions  . Sulfa Antibiotics Rash  . Ramipril     unknown    Social History   Social History  . Marital status: Divorced    Spouse name: N/A  . Number of children: N/A  . Years of education: N/A   Occupational History  . Not on file.   Social History Main Topics  . Smoking status: Never Smoker  . Smokeless tobacco: Never Used  . Alcohol use No     Comment: 08/05/2016 "quit drinking in the 1970s"  . Drug use: No  . Sexual activity: Yes   Other Topics Concern  . Not on file   Social History Narrative    . No narrative on file     Review of Systems: General: negative for chills, fever, night sweats or weight changes.  Cardiovascular: negative for chest pain, dyspnea on exertion, edema, orthopnea, palpitations, paroxysmal nocturnal dyspnea or shortness of breath Dermatological: negative for rash Respiratory: negative for cough or wheezing Urologic: negative for hematuria Abdominal: negative for nausea, vomiting, diarrhea, bright red blood per rectum, melena, or hematemesis Neurologic: negative for visual changes, syncope, or dizziness All other systems reviewed and are otherwise negative except as noted above.    Blood pressure 115/68, pulse (!) 51, height 5\' 11"  (1.803 m), weight 144 lb (65.3 kg).  General appearance: alert and no distress Neck: no adenopathy, no carotid bruit, no JVD, supple, symmetrical, trachea midline and thyroid not enlarged, symmetric, no tenderness/mass/nodules Lungs: clear to auscultation bilaterally Heart: regular rate and rhythm, S1, S2 normal, no murmur, click, rub or gallop Extremities: extremities normal, atraumatic, no cyanosis or edema  EKG sinus bradycardia 51 with right bundle-branch block, left intrafascicular block and first-degree AV block. I personally reviewed this EKG.  ASSESSMENT AND PLAN:   Critical lower limb ischemia Mr. Morua returns today for post hospital follow-up. I performed peripheral angiography and intervention on him 01/24/17 because of a nonhealing wound on his left heel. I performed clockwise direction atherectomy followed by drug-eluting balloon angioplasty of a 90% segmental mid left SFA stenosis. He did have 1 vessel runoff below the knee with a patent peroneal artery. This follow-up Dopplers performed 01/31/17 revealed a left ABI 1.1 with a widely patent left SFA. His left heel wound has subsequently healed. We will continue Dopplers on him on a semiannual basis and I will see him back when necessary.      Lorretta Harp MD FACP,FACC,FAHA, [facility] 02/18/2017 11:04 AM

## 2017-02-18 NOTE — Assessment & Plan Note (Signed)
Gregory [name] returns today for post hospital follow-up. I performed peripheral angiography and intervention on him 01/24/17 because of a nonhealing wound on his left heel. I performed clockwise direction atherectomy followed by drug-eluting balloon angioplasty of a 90% segmental mid left SFA stenosis. He did have 1 vessel runoff below the knee with a patent peroneal artery. This follow-up Dopplers performed 01/31/17 revealed a left ABI 1.1 with a widely patent left SFA. His left heel wound has subsequently healed. We will continue Dopplers on him on a semiannual basis and I will see him back when necessary.

## 2017-03-15 ENCOUNTER — Telehealth: Payer: Self-pay

## 2017-03-15 ENCOUNTER — Encounter: Payer: Self-pay | Admitting: Cardiovascular Disease

## 2017-03-15 ENCOUNTER — Ambulatory Visit (INDEPENDENT_AMBULATORY_CARE_PROVIDER_SITE_OTHER): Payer: Medicare Other | Admitting: Cardiovascular Disease

## 2017-03-15 VITALS — BP 116/70 | HR 54 | Ht 71.0 in | Wt 146.8 lb

## 2017-03-15 DIAGNOSIS — I480 Paroxysmal atrial fibrillation: Secondary | ICD-10-CM

## 2017-03-15 DIAGNOSIS — R600 Localized edema: Secondary | ICD-10-CM | POA: Diagnosis not present

## 2017-03-15 DIAGNOSIS — I10 Essential (primary) hypertension: Secondary | ICD-10-CM

## 2017-03-15 DIAGNOSIS — I4510 Unspecified right bundle-branch block: Secondary | ICD-10-CM

## 2017-03-15 DIAGNOSIS — Z79899 Other long term (current) drug therapy: Secondary | ICD-10-CM | POA: Diagnosis not present

## 2017-03-15 DIAGNOSIS — Z7901 Long term (current) use of anticoagulants: Secondary | ICD-10-CM

## 2017-03-15 MED ORDER — TORSEMIDE 20 MG PO TABS
ORAL_TABLET | ORAL | 3 refills | Status: DC
Start: 2017-03-15 — End: 2017-06-03

## 2017-03-15 NOTE — Patient Instructions (Signed)
Your physician has requested that you have a lower  extremity venous duplex. This test is an ultrasound of the veins in the legs or arms. It looks at venous blood flow that carries blood from the heart to the legs or arms. Allow one hour for a Lower Venous exam. Allow thirty minutes for an Upper Venous exam. There are no restrictions or special instructions.  Your physician recommends that you return for lab work in: 1 week here in our office. No appointment needed, however the lab is usually closed between 1-2 for lunch.  Your physician has recommended you make the following change in your medication:   1.) STOP the furosemide. This has been replaced with torsemide. Take as directed on the bottle.  Marland Kitchen How to Use Compression Stockings Compression stockings are elastic socks that squeeze the legs. They help to increase blood flow to the legs, decrease swelling in the legs, and reduce the chance of developing blood clots in the lower legs. Compression stockings are often used by people who:  Are recovering from surgery.  Have poor circulation in their legs.  Are prone to getting blood clots in their legs.  Have varicose veins.  Sit or stay in bed for long periods of time.  How to use compression stockings Before you put on your compression stockings:  Make sure that they are the correct size. If you do not know your size, ask your health care provider.  Make sure that they are clean, dry, and in good condition.  Check them for rips and tears. Do not put them on if they are ripped or torn.  Put your stockings on first thing in the morning, before you get out of bed. Keep them on for as long as your health care provider advises. When you are wearing your stockings:  Keep them as smooth as possible. Do not allow them to bunch up. It is especially important to prevent the stockings from bunching up around your toes or behind your knees.  Do not roll the stockings downward and leave them  rolled down. This can decrease blood flow to your leg.  Change them right away if they become wet or dirty. 1.   When you take off your stockings, inspect your legs and feet. Anything that does not seem normal may require medical attention. Look for:  Open sores.  Red spots.  Swelling.  Information and tips  Do not stop wearing your compression stockings without talking to your health care provider first.  Wash your stockings every day with mild detergent in cold or warm water. Do not use bleach. Air-dry your stockings or dry them in a clothes dryer on low heat.  Replace your stockings every 3-6 months.  If skin moisturizing is part of your treatment plan, apply lotion or cream at night so that your skin will be dry when you put on the stockings in the morning. It is harder to put the stockings on when you have lotion on your legs or feet. Contact a health care provider if: Remove your stockings and seek medical care if:  You have a feeling of pins and needles in your feet or legs.  You have any new changes in your skin.  You have skin lesions that are getting worse.  You have swelling or pain that is getting worse.  Get help right away if:  You have numbness or tingling in your lower legs that does not get better right after you take the stockings  off.  Your toes or feet become cold and blue.  You develop open sores or red spots on your legs that do not go away.  You see or feel a warm spot on your leg.  You have new swelling or soreness in your leg.  You are short of breath or you have chest pain for no reason.  You have a rapid or irregular heartbeat.  You feel light-headed or dizzy. This information is not intended to replace advice given to you by your health care provider. Make sure you discuss any questions you have with your health care provider. Document Released: 05/30/2009 Document Revised: 12/31/2015 Document Reviewed: 07/10/2014 Elsevier Interactive  Patient Education  2018 [employer].   Your physician recommends that you schedule a follow-up appointment in: 3 months with Dr [name]

## 2017-03-15 NOTE — Telephone Encounter (Signed)
FAXED NOTES TO [facility] 

## 2017-03-15 NOTE — Progress Notes (Signed)
Cardiology Office Note    Date:  03/15/2017   ID:  Gregory, [name] Dec 20, 1929, MRN [id number]  PCP:  [name], MD  Cardiologist:  Shelva Majestic, MD   No chief complaint on file.   History of Present Illness:  Gregory [name] is a 83 y.o. male presents to the office for an 8 month follow-up cardiology evaluation.  [name] is an 83 year old African-American gentleman who has a history of hypertension as well as a history of a prior nonhemorrhagic CVA and a remote history of GI bleeding.  He developed severe chest pain which persisted throughout the night and presented to [facility] hospital on 03/26/2016.  A code STEMI was activated.  When he presented with new right bundle branch block with slight ST elevation in V2 and V3.  Emergent catheterization by me revealed chronic calcification with 50% distal left main stenosis, a normal LAD, 75% ostial circumflex stenosis with 30% mid circumflex and 50% distal marginal stenosis, 30% proximal RCA with 60-70% distal stenosis proximal to the PDA takeoff and 50% distal PDA stenosis.  In initial medical therapy trial was recommended with aggressive medical management.  An echo Doppler study showed an EF of 55-60% with grade 1 diastolic dysfunction.  There was severe LVH, mild biatrial enlargement, and his myocardium had a speckled appearance suggesting the possibility of amyloid.  He was seen in the office by [name], NP on 04/14/2016.  He had developed PAF during his hospitalization and was started on eliquis prior to discharge.  When I saw him for his initial follow-up evaluation with me in December 2017 he was maintaining sinus rhythm and was without anginal symptomatology.  He had significant 2+ edema in his right lower extremity 1+ in his left.  I added furosemide to his medical regimen.  He was found to have significant lower extremity arterial insufficiency with heel ulceration.  He has subsequently undergone PV angiogram and  intervention by Dr. [name] on 01/24/2017 and was found to have high grade calcified segmental mid left SFA stenoses with one-vessel runoff via the peroneal artery with critical limb ischemia.  He underwent successful directional atherectomy followed by drug-eluting balloon angioplasty.  Previously, he had poor wound healing.  He had undergone wound center treatments..  He had follow-up lower extremity Doppler studies on 01/31/2017 which showed significant improvement in his lower extremity arterial disease with 30-49% disease in the left CFA, distal SFA, and popliteal artery, status post left mid SFA atherectomy and angioplasty.  Patient currently admits to tense lower extremity leg swelling.  He denies recurrent anginal symptoms.  He is unaware of palpitations.  He presents for evaluation.    Past Medical History:  Diagnosis Date  . Arthritis    "legs, knees" (08/05/2016)  . CAD (coronary artery disease)    a. cath 03/2016: diffuse disease w/ 50% ostial LM stenosis, 75% ostial Cx, 50% 3rd Mrg, 65% distal RCA, and 50% RPDA. Medical management recommended.  . Critical lower limb ischemia 01/24/2017  . GIB (gastrointestinal bleeding)    a. occurred in 2016, secondary to gastritis and diverticulosis  . History of gout   . History of stomach ulcers   . Hypercholesteremia   . Hypertension   . Hypertensive heart disease   . LVH (left ventricular hypertrophy)    a. 03/2016 Echo: EF 55-65%, no rwma, Gr1 DD, sev LVH, mildly dil RA/LA, small peric eff;  b. 03/2016 Cardiac MRI: sev LVH, mildly dil RV, possible RV thrombus, diffuse LGE  involving RV and diff subendocardial LGE in LV. *No M spike on SPEP, nl immunofixation pattern - no clear evidence of cardiac amyloidosis.  . Murmur, cardiac   . PAF (paroxysmal atrial fibrillation) ([place])    a. new-onset 03/2016, placed on Eliquis (CHA2DS2VASc = 6).  . Pericardial effusion    a. diagnosed in 07/2016. Underwent pericardiocentesis at that time.   . Pneumonia      "quite a few times" (08/05/2016)  . Poor appetite   . Prostate cancer ([place])   . STEMI (ST elevation myocardial infarction) ([place]) 03/2016   Archie Endo 03/26/2016  . Stroke [facility])    a. nonhemorrhagic CVA in 2015    Past Surgical History:  Procedure Laterality Date  . ABDOMINAL AORTOGRAM W/LOWER EXTREMITY N/A 01/24/2017   Procedure: Abdominal Aortogram w/Lower Extremity;  Surgeon: Lorretta Harp, MD;  Location: [place] [facility];  Service: Cardiovascular;  Laterality: N/A;  . CARDIAC CATHETERIZATION N/A 03/26/2016   Procedure: Left Heart Cath and Coronary Angiography;  Surgeon: Troy Sine, MD;  Location: [place] [facility];  Service: Cardiovascular;  Laterality: N/A;  . CARDIAC CATHETERIZATION N/A 08/06/2016   Procedure: Pericardiocentesis;  Surgeon: Sherren Mocha, MD;  Location: [place] [facility];  Service: Cardiovascular;  Laterality: N/A;  . COLONOSCOPY WITH PROPOFOL N/A 10/04/2014   Procedure: COLONOSCOPY WITH PROPOFOL;  Surgeon: Laurence Spates, MD;  Location: [place];  Service: Endoscopy;  Laterality: N/A;  . DIAGNOSTIC LAPAROSCOPY  08/05/2016   reduction of strangulated small bowel from left inguinal hernia, TAPP repair with phasix mesh, small bowel resection/notes 08/05/2016  . ESOPHAGOGASTRODUODENOSCOPY N/A 10/03/2014   Procedure: ESOPHAGOGASTRODUODENOSCOPY (EGD);  Surgeon: Winfield Cunas., MD;  Location: [facility] ENDOSCOPY;  Service: Endoscopy;  Laterality: N/A;  . HERNIA REPAIR    . LAPAROSCOPY N/A 08/05/2016   Procedure: LAPAROSCOPY DIAGNOSTIC AND LAPAROSCOPIC INGUINAL HERNIA REPAIR.;  Surgeon: Clovis Riley, MD;  Location: [place];  Service: General;  Laterality: N/A;  . LAPAROTOMY N/A 08/05/2016   Procedure: Anne Fu WITH  SMALL BOWEL RESECTION.;  Surgeon: Clovis Riley, MD;  Location: [place];  Service: General;  Laterality: N/A;  . PERIPHERAL VASCULAR ATHERECTOMY  01/24/2017   Procedure: Peripheral Vascular Atherectomy;  Surgeon: Lorretta Harp, MD;   Location: [place] [facility];  Service: Cardiovascular;;  Lt SFA  . PROSTATECTOMY  1990s?    Current Medications: Outpatient Medications Prior to Visit  Medication Sig Dispense Refill  . amiodarone (PACERONE) 100 MG tablet Take 1 tablet (100 mg total) by mouth daily. 90 tablet 1  . apixaban (ELIQUIS) 5 MG TABS tablet Take 1 tablet (5 mg total) by mouth 2 (two) times daily. 60 tablet 0  . aspirin EC 81 MG EC tablet Take 1 tablet (81 mg total) by mouth daily. 30 tablet 11  . atorvastatin (LIPITOR) 40 MG tablet Take 1 tablet (40 mg total) by mouth daily. 30 tablet 0  . brimonidine (ALPHAGAN) 0.2 % ophthalmic solution Place 1 drop into both eyes at bedtime. 5 mL 0  . clopidogrel (PLAVIX) 75 MG tablet Take 1 tablet (75 mg total) by mouth daily with breakfast. 30 tablet 11  . colchicine 0.6 MG tablet Take 1 tablet (0.6 mg total) by mouth daily.    . feeding supplement, ENSURE ENLIVE, (ENSURE ENLIVE) LIQD Take 237 mLs by mouth 2 (two) times daily between meals. 60 Bottle 0  . metoprolol (LOPRESSOR) 100 MG tablet Take 1 tablet (100 mg total) by mouth 2 (two) times daily. 60 tablet 0  .  mirtazapine (REMERON SOL-TAB) 15 MG disintegrating tablet Take 0.5 tablets (7.5 mg total) by mouth at bedtime. 30 tablet 0  . Multiple Vitamin (MULTIVITAMIN WITH MINERALS) TABS tablet Take 1 tablet by mouth daily.    . ondansetron (ZOFRAN-ODT) 4 MG disintegrating tablet Take 1 tablet (4 mg total) by mouth every 6 (six) hours as needed for nausea. (Patient taking differently: Take 4 mg by mouth every 6 (six) hours as needed for nausea. DISSOLVE IN THE MOUTH) 20 tablet 0  . pantoprazole (PROTONIX) 40 MG tablet Take 1 tablet (40 mg total) by mouth 2 (two) times daily. 60 tablet 0  . potassium chloride (K-DUR) 10 MEQ tablet Take 1 tablet (10 mEq total) by mouth daily. 30 tablet 3  . sertraline (ZOLOFT) 50 MG tablet Take 1 tablet (50 mg total) by mouth daily. 30 tablet 0  . furosemide (LASIX) 20 MG tablet Take 1 tablet (20 mg  total) by mouth daily. FOR EDEMA 30 tablet 3   No facility-administered medications prior to visit.      Allergies:   Sulfa antibiotics and Ramipril   Social History   Social History  . Marital status: Divorced    Spouse name: N/A  . Number of children: N/A  . Years of education: N/A   Social History Main Topics  . Smoking status: Never Smoker  . Smokeless tobacco: Never Used  . Alcohol use No     Comment: 08/05/2016 "quit drinking in the 1970s"  . Drug use: No  . Sexual activity: Yes   Other Topics Concern  . Not on file   Social History Narrative  . No narrative on file     Family History:  The patient's family history includes Hypertension in his mother.   ROS General: Negative; No fevers, chills, or night sweats;  HEENT: Negative; No changes in vision or hearing, sinus congestion, difficulty swallowing Pulmonary: Negative; No cough, wheezing, shortness of breath, hemoptysis Cardiovascular: See history of present illness Positive for lower extremity swelling GI: Negative; No nausea, vomiting, diarrhea, or abdominal pain GU: Negative; No dysuria, hematuria, or difficulty voiding Musculoskeletal: Mild leg weakness Hematologic/Oncology: Negative; no easy bruising, bleeding Endocrine: Negative; no heat/cold intolerance; no diabetes Neuro: Negative; no changes in balance, headaches Skin: Negative; No rashes or skin lesions Psychiatric: Negative; No behavioral problems, depression Sleep: Negative; No snoring, daytime sleepiness, hypersomnolence, bruxism, restless legs, hypnogognic hallucinations, no cataplexy Other comprehensive 14 point system review is negative.   PHYSICAL EXAM:   VS:  BP 116/70   Pulse (!) 54   Ht '5\' 11"'  (1.803 m)   Wt 146 lb 12.8 oz (66.6 kg)   BMI 20.47 kg/m     Wt Readings from Last 3 Encounters:  03/15/17 146 lb 12.8 oz (66.6 kg)  02/18/17 144 lb (65.3 kg)  01/25/17 119 lb 0.8 oz (54 kg)    General: Alert, oriented, no distress.    Skin: normal turgor, no rashes, warm and dry HEENT: Normocephalic, atraumatic. Pupils equal round and reactive to light; sclera anicteric; extraocular muscles intact;  Nose without nasal septal hypertrophy Mouth/Parynx benign; Mallinpatti scale 3 Neck: No JVD, no carotid bruits; normal carotid upstroke Lungs: clear to ausculatation and percussion; no wheezing or rales Chest wall: without tenderness to palpitation Heart: PMI not displaced, RRR, s1 s2 normal, 1/6 systolic murmur, no diastolic murmur, no rubs, gallops, thrills, or heaves Abdomen: soft, nontender; no hepatosplenomehaly, BS+; abdominal aorta nontender and not dilated by palpation. Back: no CVA tenderness Pulses 2+ Musculoskeletal: full range  of motion, normal strength, no joint deformities Extremities: 2+ tense lower extremity edema, right greater than left.  A bandage is still present in left lower leg;  no clubbing, cyanosis , Homan's sign negative  Neurologic: grossly nonfocal; Cranial nerves grossly wnl Psychologic: Normal mood and affect   Studies/Labs Reviewed:   ECG (independently read by me): Sinus rhythm with first-degree AV block.  Right bundle branch block with repolarization changes.  Recent Labs: BMP Latest Ref Rng & Units 01/25/2017 01/11/2017 12/01/2016  Glucose 65 - 99 mg/dL 116(H) 84 85  BUN 6 - 20 mg/dL '19 18 19  ' Creatinine 0.61 - 1.24 mg/dL 0.93 1.11 0.89  BUN/Creat Ratio 10 - 24 - 16 21  Sodium 135 - 145 mmol/L 137 141 143  Potassium 3.5 - 5.1 mmol/L 3.3(L) 4.6 4.2  Chloride 101 - 111 mmol/L 107 105 103  CO2 22 - 32 mmol/L '25 23 23  ' Calcium 8.9 - 10.3 mg/dL 8.4(L) 9.2 8.8     Hepatic Function Latest Ref Rng & Units 09/22/2016 09/20/2016 09/19/2016  Total Protein 6.5 - 8.1 g/dL 4.6(L) 5.1(L) 5.5(L)  Albumin 3.5 - 5.0 g/dL 1.9(L) 2.2(L) 2.5(L)  AST 15 - 41 U/L 33 62(H) 182(H)  ALT 17 - 63 U/L 52 101(H) 187(H)  Alk Phosphatase 38 - 126 U/L 121 126 152(H)  Total Bilirubin 0.3 - 1.2 mg/dL 1.2 1.6(H) 2.7(H)   Bilirubin, Direct 0.1 - 0.5 mg/dL - 0.7(H) -    CBC Latest Ref Rng & Units 01/25/2017 01/11/2017 09/20/2016  WBC 4.0 - 10.5 K/uL 5.5 5.1 7.3  Hemoglobin 13.0 - 17.0 g/dL 10.1(L) 11.6(L) 10.7(L)  Hematocrit 39.0 - 52.0 % 31.0(L) 35.8(L) 33.2(L)  Platelets 150 - 400 K/uL 103(L) 155 99(L)   Lab Results  Component Value Date   MCV 86.8 01/25/2017   MCV 90 01/11/2017   MCV 84.5 09/20/2016   Lab Results  Component Value Date   TSH 2.180 01/11/2017   Lab Results  Component Value Date   HGBA1C 5.6 03/26/2016     BNP No results found for: BNP  ProBNP No results found for: PROBNP   Lipid Panel     Component Value Date/Time   CHOL 85 1Oct 31, 202017 0825   TRIG 28 1Oct 31, 202017 0825   HDL 46 1Oct 31, 202017 0825   CHOLHDL 1.8 1Oct 31, 202017 0825   VLDL 6 1Oct 31, 202017 0825   LDLCALC 33 1Oct 31, 202017 0825     RADIOLOGY: No results found.   Additional studies/ records that were reviewed today include:  I reviewed the patient's hospitalization, catheterization study, and subsequent office notes. I have reviewed his Doppler lower extremity arterial studies from 12/24/2016 and 01/31/2017, as well as his PV angiographic evaluation and intervention   ASSESSMENT:    1. Paroxysmal atrial fibrillation (HCC)   2. Essential hypertension   3. Bilateral lower extremity edema   4. Right bundle branch block   5. Medication management   6. Anticoagulation adequate      PLAN:  Mr. Othar Curto is an 83 year old African-American male who developed an acute coronary syndrome on 03/26/2016 and underwent emergent cardiac catheterization.  He was found to have hyperdynamic function without focal segmental wall motion abnormalities.  He had significant coronary calcification with diffuse stenoses as noted above. He developed atrial fibrillation during his hospitalization and was started on anticoagulation.  He is maintaining sinus rhythm and remains on low-dose amiodarone at 100 mg twice a day in addition to  metoprolol which currently is 100 mg twice a day.  He is  asymptomatic with reference to his bradycardia, but if this increases the beta blocker regimen will be reduced.  His ECG today shows sinus bradycardia with first-degree AV block, right bundle branch block.  He has not had any anginal symptomatology on his medical regimen.  His blood pressure today is controlled at 116/70.  In light of his peripheral edema he is no longer on amlodipine which had been discontinued.  He continues to have tense lower extremity edema despite taking furosemide.  I will discontinue this and switch this to torsemide, which she will initiate a 20 mg twice a day for the next 3-4 days and will then change to daily unless the peripheral edema has not resolved.  I'm also scheduling to wear compression stockings with 20-30 mm pressure support.  I'm scheduling him to undergo lower extremity venous Doppler studies to assess for venous insufficiency.  He will undergo a follow-up comprehensive metabolic panel in 1 week.  I will see him in 2-3 months for reevaluation.   Medication Adjustments/Labs and Tests Ordered: Current medicines are reviewed at length with the patient today.  Concerns regarding medicines are outlined above.  Medication changes, Labs and Tests ordered today are listed in the Patient Instructions below. Patient Instructions  Your physician has requested that you have a lower  extremity venous duplex. This test is an ultrasound of the veins in the legs or arms. It looks at venous blood flow that carries blood from the heart to the legs or arms. Allow one hour for a Lower Venous exam. Allow thirty minutes for an Upper Venous exam. There are no restrictions or special instructions.  Your physician recommends that you return for lab work in: 1 week here in our office. No appointment needed, however the lab is usually closed between 1-2 for lunch.  Your physician has recommended you make the following change in your  medication:   1.) STOP the furosemide. This has been replaced with torsemide. Take as directed on the bottle.  Marland Kitchen How to Use Compression Stockings Compression stockings are elastic socks that squeeze the legs. They help to increase blood flow to the legs, decrease swelling in the legs, and reduce the chance of developing blood clots in the lower legs. Compression stockings are often used by people who:  Are recovering from surgery.  Have poor circulation in their legs.  Are prone to getting blood clots in their legs.  Have varicose veins.  Sit or stay in bed for long periods of time.  How to use compression stockings Before you put on your compression stockings:  Make sure that they are the correct size. If you do not know your size, ask your health care provider.  Make sure that they are clean, dry, and in good condition.  Check them for rips and tears. Do not put them on if they are ripped or torn.  Put your stockings on first thing in the morning, before you get out of bed. Keep them on for as long as your health care provider advises. When you are wearing your stockings:  Keep them as smooth as possible. Do not allow them to bunch up. It is especially important to prevent the stockings from bunching up around your toes or behind your knees.  Do not roll the stockings downward and leave them rolled down. This can decrease blood flow to your leg.  Change them right away if they become wet or dirty. 1.   When you take off your stockings, inspect your  legs and feet. Anything that does not seem normal may require medical attention. Look for:  Open sores.  Red spots.  Swelling.  Information and tips  Do not stop wearing your compression stockings without talking to your health care provider first.  Wash your stockings every day with mild detergent in cold or warm water. Do not use bleach. Air-dry your stockings or dry them in a clothes dryer on low heat.  Replace your  stockings every 3-6 months.  If skin moisturizing is part of your treatment plan, apply lotion or cream at night so that your skin will be dry when you put on the stockings in the morning. It is harder to put the stockings on when you have lotion on your legs or feet. Contact a health care provider if: Remove your stockings and seek medical care if:  You have a feeling of pins and needles in your feet or legs.  You have any new changes in your skin.  You have skin lesions that are getting worse.  You have swelling or pain that is getting worse.  Get help right away if:  You have numbness or tingling in your lower legs that does not get better right after you take the stockings off.  Your toes or feet become cold and blue.  You develop open sores or red spots on your legs that do not go away.  You see or feel a warm spot on your leg.  You have new swelling or soreness in your leg.  You are short of breath or you have chest pain for no reason.  You have a rapid or irregular heartbeat.  You feel light-headed or dizzy. This information is not intended to replace advice given to you by your health care provider. Make sure you discuss any questions you have with your health care provider. Document Released: 05/30/2009 Document Revised: 12/31/2015 Document Reviewed: 07/10/2014 Elsevier Interactive Patient Education  2018 [employer].   Your physician recommends that you schedule a follow-up appointment in: 3 months with Dr [name]        Signed, Shelva Majestic, MD  03/15/2017 6:52 PM    [facility] [address] [place], [place], [place]  [address] Phone: [phone]

## 2017-03-24 ENCOUNTER — Ambulatory Visit (HOSPITAL_COMMUNITY)
Admission: RE | Admit: 2017-03-24 | Discharge: 2017-03-24 | Disposition: A | Discharge status: Home / Self Care | Payer: Medicare Other | Source: Ambulatory Visit | Attending: Internal Medicine | Admitting: Internal Medicine

## 2017-03-24 DIAGNOSIS — R600 Localized edema: Secondary | ICD-10-CM | POA: Diagnosis present

## 2017-03-24 DIAGNOSIS — M7989 Other specified soft tissue disorders: Secondary | ICD-10-CM | POA: Diagnosis not present

## 2017-04-22 ENCOUNTER — Telehealth: Payer: Self-pay | Admitting: *Deleted

## 2017-04-22 NOTE — Telephone Encounter (Signed)
Left message to call back for results, per dpr:  Notes recorded by Troy Sine, MD on 04/22/2017 at 6:53 AM EDT No dvt; interstial edema is present in calves

## 2017-04-28 ENCOUNTER — Other Ambulatory Visit: Payer: Self-pay | Admitting: Physician Assistant

## 2017-04-28 NOTE — Telephone Encounter (Signed)
Refill Request.  

## 2017-06-03 ENCOUNTER — Encounter: Payer: Self-pay | Admitting: Cardiovascular Disease

## 2017-06-03 ENCOUNTER — Ambulatory Visit (INDEPENDENT_AMBULATORY_CARE_PROVIDER_SITE_OTHER): Payer: Medicare Other | Admitting: Cardiovascular Disease

## 2017-06-03 VITALS — BP 104/60 | HR 81 | Ht 71.0 in | Wt 149.0 lb

## 2017-06-03 DIAGNOSIS — Z7901 Long term (current) use of anticoagulants: Secondary | ICD-10-CM | POA: Diagnosis not present

## 2017-06-03 DIAGNOSIS — R600 Localized edema: Secondary | ICD-10-CM

## 2017-06-03 DIAGNOSIS — I2510 Atherosclerotic heart disease of native coronary artery without angina pectoris: Secondary | ICD-10-CM | POA: Diagnosis not present

## 2017-06-03 DIAGNOSIS — I480 Paroxysmal atrial fibrillation: Secondary | ICD-10-CM | POA: Diagnosis not present

## 2017-06-03 DIAGNOSIS — I10 Essential (primary) hypertension: Secondary | ICD-10-CM

## 2017-06-03 DIAGNOSIS — I440 Atrioventricular block, first degree: Secondary | ICD-10-CM

## 2017-06-03 DIAGNOSIS — I4510 Unspecified right bundle-branch block: Secondary | ICD-10-CM

## 2017-06-03 MED ORDER — TORSEMIDE 20 MG PO TABS: 20.0000 mg | ORAL_TABLET | Freq: Two times a day (BID) | ORAL | 3 refills | Status: DC

## 2017-06-03 MED ORDER — AMIODARONE HCL 100 MG PO TABS: 100.0000 mg | ORAL_TABLET | Freq: Every day | ORAL | 1 refills | Status: AC

## 2017-06-03 NOTE — Progress Notes (Signed)
Cardiology Office Note    Date:  06/05/2017   ID:  Gregory [name] 1930-01-28, MRN [id number]  PCP:  [name], MD  Cardiologist:  Shelva Majestic, MD   Chief Complaint  Patient presents with  . Follow-up    History of Present Illness:  Gregory [name] is a 83 y.o. male presents to the office for a 3 month follow-up cardiology evaluation.  Gregory [name] is an 83 year old African-American gentleman who has a history of hypertension as well as a history of a prior nonhemorrhagic CVA and a remote history of GI bleeding.  He developed severe chest pain which persisted throughout the night and presented to [facility] hospital on 03/26/2016.  A code STEMI was activated.  When he presented with new right bundle branch block with slight ST elevation in V2 and V3.  Emergent catheterization by me revealed chronic calcification with 50% distal left main stenosis, a normal LAD, 75% ostial circumflex stenosis with 30% mid circumflex and 50% distal marginal stenosis, 30% proximal RCA with 60-70% distal stenosis proximal to the PDA takeoff and 50% distal PDA stenosis.  In initial medical therapy trial was recommended with aggressive medical management.  An echo Doppler study showed an EF of 55-60% with grade 1 diastolic dysfunction.  There was severe LVH, mild biatrial enlargement, and his myocardium had a speckled appearance suggesting the possibility of amyloid.  He was seen in the office by Gregory Hodgkins, NP on 04/14/2016.  He had developed PAF during his hospitalization and was started on eliquis prior to discharge.  When I saw him for his initial follow-up evaluation with me in December 2017 he was maintaining sinus rhythm and was without anginal symptomatology.  He had significant 2+ edema in his right lower extremity 1+ in his left.  I added furosemide to his medical regimen.  He was found to have significant lower extremity arterial insufficiency with heel ulceration.  He has subsequently undergone  PV angiogram and intervention by Dr. [name] on 01/24/2017 and was found to have high grade calcified segmental mid left SFA stenoses with one-vessel runoff via the peroneal artery with critical limb ischemia.  He underwent successful directional atherectomy followed by drug-eluting balloon angioplasty.  Previously, he had poor wound healing.  He had undergone wound center treatments..  He had follow-up lower extremity Doppler studies on 01/31/2017 which showed significant improvement in his lower extremity arterial disease with 30-49% disease in the left CFA, distal SFA, and popliteal artery, status post left mid SFA atherectomy and angioplasty.    When I last saw him, he complained of significant lower extremity swelling.  He had tense edema despite taking furosemide.  I recommended switching him to torsemide, initially at 20 Devera twice a day for 3-4 days and then daily.  I recommended compression stockings.  I also scheduled him to undergo lower from the venous Doppler studies to assess for venous insufficiency.  The Doppler study did not reveal evidence for lower extremity deep or superficial venous thrombus or incompetence bilaterally.  There was interstitial fluid noted in the calf bilaterally.  Over the last several months.  He denies any recurrent awareness of atrial fibrillation.  He denies chest pain.  He completed therapy in the wound clinic.  He continues to note leg swelling, right greater than left.  He presents for evaluation.  Past Medical History:  Diagnosis Date  . Arthritis    "legs, knees" (08/05/2016)  . CAD (coronary artery disease)    a. cath  03/2016: diffuse disease w/ 50% ostial LM stenosis, 75% ostial Cx, 50% 3rd Mrg, 65% distal RCA, and 50% RPDA. Medical management recommended.  . Critical lower limb ischemia 01/24/2017  . GIB (gastrointestinal bleeding)    a. occurred in 2016, secondary to gastritis and diverticulosis  . History of gout   . History of stomach ulcers   .  Hypercholesteremia   . Hypertension   . Hypertensive heart disease   . LVH (left ventricular hypertrophy)    a. 03/2016 Echo: EF 55-65%, no rwma, Gr1 DD, sev LVH, mildly dil RA/LA, small peric eff;  b. 03/2016 Cardiac MRI: sev LVH, mildly dil RV, possible RV thrombus, diffuse LGE involving RV and diff subendocardial LGE in LV. *No M spike on SPEP, nl immunofixation pattern - no clear evidence of cardiac amyloidosis.  . Murmur, cardiac   . PAF (paroxysmal atrial fibrillation) (Gregory [name])    a. new-onset 03/2016, placed on Eliquis (CHA2DS2VASc = 6).  . Pericardial effusion    a. diagnosed in 07/2016. Underwent pericardiocentesis at that time.   . Pneumonia    "quite a few times" (08/05/2016)  . Poor appetite   . Prostate cancer (Gregory [name])   . STEMI (ST elevation myocardial infarction) (Gregory [name]) 03/2016   Gregory [name] 03/26/2016  . Stroke [facility])    a. nonhemorrhagic CVA in 2015    Past Surgical History:  Procedure Laterality Date  . ABDOMINAL AORTOGRAM W/LOWER EXTREMITY N/A 01/24/2017   Procedure: Abdominal Aortogram w/Lower Extremity;  Surgeon: Lorretta Harp, MD;  Location: [place] [facility];  Service: Cardiovascular;  Laterality: N/A;  . CARDIAC CATHETERIZATION N/A 03/26/2016   Procedure: Left Heart Cath and Coronary Angiography;  Surgeon: Troy Sine, MD;  Location: [facility];  Service: Cardiovascular;  Laterality: N/A;  . CARDIAC CATHETERIZATION N/A 08/06/2016   Procedure: Pericardiocentesis;  Surgeon: Sherren Mocha, MD;  Location: [place];  Service: Cardiovascular;  Laterality: N/A;  . COLONOSCOPY WITH PROPOFOL N/A 10/04/2014   Procedure: COLONOSCOPY WITH PROPOFOL;  Surgeon: Laurence Spates, MD;  Location: [place];  Service: Endoscopy;  Laterality: N/A;  . DIAGNOSTIC LAPAROSCOPY  08/05/2016   reduction of strangulated small bowel from left inguinal hernia, TAPP repair with phasix mesh, small bowel resection/notes 08/05/2016  . ESOPHAGOGASTRODUODENOSCOPY N/A 10/03/2014    Procedure: ESOPHAGOGASTRODUODENOSCOPY (EGD);  Surgeon: Winfield Cunas., MD;  Location: [facility];  Service: Endoscopy;  Laterality: N/A;  . HERNIA REPAIR    . LAPAROSCOPY N/A 08/05/2016   Procedure: LAPAROSCOPY DIAGNOSTIC AND LAPAROSCOPIC INGUINAL HERNIA REPAIR.;  Surgeon: Clovis Riley, MD;  Location: [place];  Service: General;  Laterality: N/A;  . LAPAROTOMY N/A 08/05/2016   Procedure: Anne Fu WITH  SMALL BOWEL RESECTION.;  Surgeon: Clovis Riley, MD;  Location: [place];  Service: General;  Laterality: N/A;  . PERIPHERAL VASCULAR ATHERECTOMY  01/24/2017   Procedure: Peripheral Vascular Atherectomy;  Surgeon: Lorretta Harp, MD;  Location: [place] [facility];  Service: Cardiovascular;;  Lt SFA  . PROSTATECTOMY  1990s?    Current Medications: Outpatient Medications Prior to Visit  Medication Sig Dispense Refill  . apixaban (ELIQUIS) 5 MG TABS tablet Take 1 tablet (5 mg total) by mouth 2 (two) times daily. 60 tablet 0  . aspirin EC 81 MG EC tablet Take 1 tablet (81 mg total) by mouth daily. 30 tablet 11  . atorvastatin (LIPITOR) 40 MG tablet Take 1 tablet (40 mg total) by mouth daily. 30 tablet 0  . brimonidine (ALPHAGAN) 0.2 % ophthalmic solution  Place 1 drop into both eyes at bedtime. 5 mL 0  . clopidogrel (PLAVIX) 75 MG tablet Take 1 tablet (75 mg total) by mouth daily with breakfast. 30 tablet 11  . colchicine 0.6 MG tablet Take 1 tablet (0.6 mg total) by mouth daily.    . metoprolol (LOPRESSOR) 100 MG tablet Take 1 tablet (100 mg total) by mouth 2 (two) times daily. (Patient taking differently: Take 100 mg by mouth daily. ) 60 tablet 0  . mirtazapine (REMERON SOL-TAB) 15 MG disintegrating tablet Take 0.5 tablets (7.5 mg total) by mouth at bedtime. 30 tablet 0  . Multiple Vitamin (MULTIVITAMIN WITH MINERALS) TABS tablet Take 1 tablet by mouth daily.    . ondansetron (ZOFRAN-ODT) 4 MG disintegrating tablet Take 1 tablet (4 mg total) by mouth every 6 (six) hours as  needed for nausea. (Patient taking differently: Take 4 mg by mouth every 6 (six) hours as needed for nausea. DISSOLVE IN THE MOUTH) 20 tablet 0  . pantoprazole (PROTONIX) 40 MG tablet Take 1 tablet (40 mg total) by mouth 2 (two) times daily. (Patient taking differently: Take 40 mg by mouth daily. ) 60 tablet 0  . potassium chloride (K-DUR) 10 MEQ tablet TAKE 1 TABLET EACH DAY. 30 tablet 3  . sertraline (ZOLOFT) 50 MG tablet Take 1 tablet (50 mg total) by mouth daily. 30 tablet 0  . amiodarone (PACERONE) 100 MG tablet Take 1 tablet (100 mg total) by mouth daily. (Patient taking differently: Take 50 mg by mouth daily. ) 90 tablet 1  . torsemide (DEMADEX) 20 MG tablet Take 1 tablet twice a day for 3 to 4 days then take 1 tablet daily thereafter 90 tablet 3  . feeding supplement, ENSURE ENLIVE, (ENSURE ENLIVE) LIQD Take 237 mLs by mouth 2 (two) times daily between meals. 60 Bottle 0  . potassium chloride (K-DUR) 10 MEQ tablet Take 1 tablet (10 mEq total) by mouth daily. 30 tablet 3   No facility-administered medications prior to visit.      Allergies:   Sulfa antibiotics and Ramipril   Social History   Social History  . Marital status: Divorced    Spouse name: N/A  . Number of children: N/A  . Years of education: N/A   Social History Main Topics  . Smoking status: Never Smoker  . Smokeless tobacco: Never Used  . Alcohol use No     Comment: 08/05/2016 "quit drinking in the 1970s"  . Drug use: No  . Sexual activity: Yes   Other Topics Concern  . None   Social History Narrative  . None     Family History:  The patient's family history includes Hypertension in his mother.   ROS General: Negative; No fevers, chills, or night sweats;  HEENT: Negative; No changes in vision or hearing, sinus congestion, difficulty swallowing Pulmonary: Negative; No cough, wheezing, shortness of breath, hemoptysis Cardiovascular: See history of present illness Positive for lower extremity swelling GI:  Negative; No nausea, vomiting, diarrhea, or abdominal pain GU: Negative; No dysuria, hematuria, or difficulty voiding Musculoskeletal: Mild leg weakness Hematologic/Oncology: Negative; no easy bruising, bleeding Endocrine: Negative; no heat/cold intolerance; no diabetes Neuro: Negative; no changes in balance, headaches Skin: Negative; No rashes or skin lesions Psychiatric: Negative; No behavioral problems, depression Sleep: Negative; No snoring, daytime sleepiness, hypersomnolence, bruxism, restless legs, hypnogognic hallucinations, no cataplexy Other comprehensive 14 point system review is negative.   PHYSICAL EXAM:   VS:  BP 104/60 (BP Location: Left Arm, Patient Position: Sitting, Cuff  Size: Normal)   Pulse 81   Ht '5\' 11"'  (1.803 m)   Wt 149 lb (67.6 kg)   BMI 20.78 kg/m     Repeat blood pressure was 110/64  Wt Readings from Last 3 Encounters:  06/03/17 149 lb (67.6 kg)  03/15/17 146 lb 12.8 oz (66.6 kg)  02/18/17 144 lb (65.3 kg)    General: Alert, oriented, no distress.  Skin: normal turgor, no rashes, warm and dry HEENT: Normocephalic, atraumatic. Pupils equal round and reactive to light; sclera anicteric; extraocular muscles intact;  Nose without nasal septal hypertrophy Mouth/Parynx benign; Mallinpatti scale 3 Neck: No JVD, no carotid bruits; normal carotid upstroke Lungs: clear to ausculatation and percussion; no wheezing or rales Chest wall: without tenderness to palpitation Heart: PMI not displaced, RRR, s1 s2 normal, 1/6 systolic murmur, no diastolic murmur, no rubs, gallops, thrills, or heaves Abdomen: soft, nontender; no hepatosplenomehaly, BS+; abdominal aorta nontender and not dilated by palpation. Back: no CVA tenderness Pulses 2+ Musculoskeletal: full range of motion, normal strength, no joint deformities Extremities: Venous stasis bilaterally, right greater than left lower extremity edema, 2+ right, 1+ left;  no clubbing, cyanosis, Homan's sign negative    Neurologic: grossly nonfocal; Cranial nerves grossly wnl Psychologic: Normal mood and affect   Studies/Labs Reviewed:   ECG (independently read by me):  Normal sinus rhythm at 81 bpm.  First-degree AV block with PR interval 244 ms.  Right bundle-branch block with repolarization changes.  Q waves inferiorly.  July 2018 ECG (independently read by me): Sinus rhythm with first-degree AV block.  Right bundle branch block with repolarization changes.  Recent Labs: BMP Latest Ref Rng & Units 01/25/2017 01/11/2017 12/01/2016  Glucose 65 - 99 mg/dL 116(H) 84 85  BUN 6 - 20 mg/dL '19 18 19  ' Creatinine 0.61 - 1.24 mg/dL 0.93 1.11 0.89  BUN/Creat Ratio 10 - 24 - 16 21  Sodium 135 - 145 mmol/L 137 141 143  Potassium 3.5 - 5.1 mmol/L 3.3(L) 4.6 4.2  Chloride 101 - 111 mmol/L 107 105 103  CO2 22 - 32 mmol/L '25 23 23  ' Calcium 8.9 - 10.3 mg/dL 8.4(L) 9.2 8.8     Hepatic Function Latest Ref Rng & Units 09/22/2016 09/20/2016 09/19/2016  Total Protein 6.5 - 8.1 g/dL 4.6(L) 5.1(L) 5.5(L)  Albumin 3.5 - 5.0 g/dL 1.9(L) 2.2(L) 2.5(L)  AST 15 - 41 U/L 33 62(H) 182(H)  ALT 17 - 63 U/L 52 101(H) 187(H)  Alk Phosphatase 38 - 126 U/L 121 126 152(H)  Total Bilirubin 0.3 - 1.2 mg/dL 1.2 1.6(H) 2.7(H)  Bilirubin, Direct 0.1 - 0.5 mg/dL - 0.7(H) -    CBC Latest Ref Rng & Units 01/25/2017 01/11/2017 09/20/2016  WBC 4.0 - 10.5 K/uL 5.5 5.1 7.3  Hemoglobin 13.0 - 17.0 g/dL 10.1(L) 11.6(L) 10.7(L)  Hematocrit 39.0 - 52.0 % 31.0(L) 35.8(L) 33.2(L)  Platelets 150 - 400 K/uL 103(L) 155 99(L)   Lab Results  Component Value Date   MCV 86.8 01/25/2017   MCV 90 01/11/2017   MCV 84.5 09/20/2016   Lab Results  Component Value Date   TSH 2.180 01/11/2017   Lab Results  Component Value Date   HGBA1C 5.6 03/26/2016     BNP No results found for: BNP  ProBNP No results found for: PROBNP   Lipid Panel     Component Value Date/Time   CHOL 85 12020/07/916 0825   TRIG 28 12020/07/916 0825   HDL 46 12020/07/916 0825    CHOLHDL 1.8 12020/07/916 0825  VLDL 6 1April 10, 202017 0825   LDLCALC 33 1April 10, 202017 0825     RADIOLOGY: No results found.   Additional studies/ records that were reviewed today include:  I reviewed the patient's hospitalization, catheterization study, and subsequent office notes. I have reviewed his Doppler lower extremity arterial studies from 12/24/2016 and 01/31/2017, as well as his PV angiographic evaluation and intervention   ASSESSMENT:    1. Bilateral lower extremity edema   2. Paroxysmal atrial fibrillation (HCC)   3. CAD in native artery   4. Essential hypertension   5. Anticoagulation adequate   6. Right bundle branch block   7. First degree AV block      PLAN:  Gregory [name] is an 83 year old African-American male who developed an acute coronary syndrome on 03/26/2016 and underwent emergent cardiac catheterization.  He was found to have hyperdynamic function without focal segmental wall motion abnormalities.  He had significant coronary calcification with diffuse stenoses as noted above. He developed atrial fibrillation during his hospitalization and was started on anticoagulation.   He has not had any recurrent atrial fibrillation and continues to be on amiodarone now at 100 mg daily.  His ECG shows sinus rhythm with his stable right bundle branch block and first-degree AV block.  He denies any symptoms of presyncope or syncope.  He is on anticoagulation with eliquis 5 twice a day and continues to be on triple drug therapy following his lower extremity atherectomy in June 2018.  With his continued edema.  I have suggested that he increase torsemide and take 20 mg twice a in the morning and at 4 PM.  His blood pressure today is controlled on metoprolol 100 twice a in addition to his torsemide.  He continues to be on atorvastatin 40 mg for his CAD/PVD with target LDL less than 70 and is tolerating this well.  I reviewed his lower extremity venous Doppler study, which did not show any  DVT and did not show significant venous incompetence.  I have suggested he follow-up with Dr. [name] in 3 months for PV reevaluation.  At that time, I suspect he may be able to be taken off Plavix and be maintained on eliquis and aspirin.  I will see him in 6 months for cardiology reevaluation.  Medication Adjustments/Labs and Tests Ordered: Current medicines are reviewed at length with the patient today.  Concerns regarding medicines are outlined above.  Medication changes, Labs and Tests ordered today are listed in the Patient Instructions below. Patient Instructions  Medication Instructions:  INCREASE torsemide to 20 mg two times daily (AM and 3pm)  Follow-Up: Your physician recommends that you schedule a follow-up appointment in: 3 months with Dr. [name]  Your physician wants you to follow-up in: 6 months with Dr. [name].  You will receive a reminder letter in the mail two months in advance. If you don't receive a letter, please call our office to schedule the follow-up appointment.    Any Other Special Instructions Will Be Listed Below (If Applicable).     If you need a refill on your cardiac medications before your next appointment, please call your pharmacy.      Signed, Shelva Majestic, MD  06/05/2017 11:05 AM    [facility] [address] [place], [place], [place]  [address] Phone: [phone]

## 2017-06-03 NOTE — Patient Instructions (Signed)
Medication Instructions:  INCREASE torsemide to 20 mg two times daily (AM and 3pm)  Follow-Up: Your physician recommends that you schedule a follow-up appointment in: 3 months with Dr. [name]  Your physician wants you to follow-up in: 6 months with Dr. [name].  You will receive a reminder letter in the mail two months in advance. If you don't receive a letter, please call our office to schedule the follow-up appointment.    Any Other Special Instructions Will Be Listed Below (If Applicable).     If you need a refill on your cardiac medications before your next appointment, please call your pharmacy.

## 2017-06-07 NOTE — Addendum Note (Signed)
Addended by: Ulice Brilliant T on: 06/07/2017 11:17 AM   Modules accepted: Orders

## 2017-06-16 ENCOUNTER — Ambulatory Visit: Payer: Medicare Other | Admitting: Cardiovascular Disease

## 2017-09-02 ENCOUNTER — Ambulatory Visit (INDEPENDENT_AMBULATORY_CARE_PROVIDER_SITE_OTHER): Payer: Medicare Other | Admitting: Cardiovascular Disease

## 2017-09-02 ENCOUNTER — Encounter: Payer: Self-pay | Admitting: Cardiovascular Disease

## 2017-09-02 DIAGNOSIS — I998 Other disorder of circulatory system: Secondary | ICD-10-CM | POA: Diagnosis not present

## 2017-09-02 DIAGNOSIS — I70229 Atherosclerosis of native arteries of extremities with rest pain, unspecified extremity: Secondary | ICD-10-CM

## 2017-09-02 NOTE — Patient Instructions (Signed)
Medication Instructions: Your physician recommends that you continue on your current medications as directed. Please refer to the Current Medication list given to you today.   Testing/Procedures: Your physician has requested that you have a lower extremity arterial duplex. During this test, ultrasound is used to evaluate arterial blood flow in the legs. Allow one hour for this exam. There are no restrictions or special instructions.  Your physician has requested that you have an ankle brachial index (ABI). During this test an ultrasound and blood pressure cuff are used to evaluate the arteries that supply the arms and legs with blood. Allow thirty minutes for this exam. There are no restrictions or special instructions.  Follow-Up: Your physician recommends that you schedule a follow-up appointment as needed with Dr. Berry.     

## 2017-09-02 NOTE — Progress Notes (Signed)
09/02/2017 Gregory [name]   11/23/1929  [id number]  Primary Physician [name], MD Primary Cardiologist: Lorretta Harp MD Lupe Carney, [place]  HPI:  Gregory [name] is a 84 y.o.  frail appearing African-American male followed by Dr. [name] . I last saw him in the office 02/18/17. He has a history of ischemic heart disease status post STEMI 8/17 with moderate CAD treated medically. He also has a history of PAF on Eliquis, hypertension, hyperlipidemia and chronic diastolic heart failure. He has asymmetric lower extremity edema right greater than left. He also has a nonhealing ulcer on his left heel which she's had for last 3 months. Lower extremity until Doppler studies performed by/11/18 revealed a right ABI of 0.91 and left of 0.95. He did have a high-frequency signal in his distal left SFA. He's also been referred to the wound care center for aggressive local wound care. He underwent lower extremity angiography by myself 01/24/17 because of a nonhealing wound on his left heel consistent with critical limb ischemia. I performed [facility]  1 directional Atherectomy followed by drug-eluting balloon angioplasty of a 90% segmental mid left SFA stenosis. He had 1 vessel runoff below the knee via peroneal artery. Follow-up Dopplers performed 01/31/17 revealed a widely patent SFA with a left ABI 1.1. His left heel wound has subsequently healed. Since I saw him in the office 6 months ago he started well. He denies claudication. The wound on his left heel has subsequently healed.     Current Meds  Medication Sig  . amiodarone (PACERONE) 100 MG tablet Take 1 tablet (100 mg total) by mouth daily.  Marland Kitchen apixaban (ELIQUIS) 5 MG TABS tablet Take 1 tablet (5 mg total) by mouth 2 (two) times daily.  Marland Kitchen aspirin EC 81 MG EC tablet Take 1 tablet (81 mg total) by mouth daily.  Marland Kitchen atorvastatin (LIPITOR) 40 MG tablet Take 1 tablet (40 mg total) by mouth daily.  . brimonidine (ALPHAGAN) 0.2 % ophthalmic solution  Place 1 drop into both eyes at bedtime.  . clopidogrel (PLAVIX) 75 MG tablet Take 1 tablet (75 mg total) by mouth daily with breakfast.  . colchicine 0.6 MG tablet Take 1 tablet (0.6 mg total) by mouth daily.  . metoprolol (LOPRESSOR) 100 MG tablet Take 1 tablet (100 mg total) by mouth 2 (two) times daily. (Patient taking differently: Take 100 mg by mouth daily. )  . mirtazapine (REMERON SOL-TAB) 15 MG disintegrating tablet Take 0.5 tablets (7.5 mg total) by mouth at bedtime.  . Multiple Vitamin (MULTIVITAMIN WITH MINERALS) TABS tablet Take 1 tablet by mouth daily.  . ondansetron (ZOFRAN-ODT) 4 MG disintegrating tablet Take 1 tablet (4 mg total) by mouth every 6 (six) hours as needed for nausea.  . pantoprazole (PROTONIX) 40 MG tablet Take 1 tablet (40 mg total) by mouth 2 (two) times daily. (Patient taking differently: Take 40 mg by mouth daily. )  . potassium chloride (K-DUR) 10 MEQ tablet TAKE 1 TABLET EACH DAY.  . sertraline (ZOLOFT) 50 MG tablet Take 1 tablet (50 mg total) by mouth daily.  Marland Kitchen torsemide (DEMADEX) 20 MG tablet Take 1 tablet (20 mg total) by mouth 2 (two) times daily.     Allergies  Allergen Reactions  . Sulfa Antibiotics Rash  . Ramipril     unknown    Social History   Socioeconomic History  . Marital status: Divorced    Spouse name: Not on file  . Number of children: Not on  file  . Years of education: Not on file  . Highest education level: Not on file  Social Needs  . Financial resource strain: Not on file  . Food insecurity - worry: Not on file  . Food insecurity - inability: Not on file  . Transportation needs - medical: Not on file  . Transportation needs - non-medical: Not on file  Occupational History  . Not on file  Tobacco Use  . Smoking status: Never Smoker  . Smokeless tobacco: Never Used  Substance and Sexual Activity  . Alcohol use: No    Comment: 08/05/2016 "quit drinking in the 1970s"  . Drug use: No  . Sexual activity: Yes  Other Topics  Concern  . Not on file  Social History Narrative  . Not on file     Review of Systems: General: negative for chills, fever, night sweats or weight changes.  Cardiovascular: negative for chest pain, dyspnea on exertion, edema, orthopnea, palpitations, paroxysmal nocturnal dyspnea or shortness of breath Dermatological: negative for rash Respiratory: negative for cough or wheezing Urologic: negative for hematuria Abdominal: negative for nausea, vomiting, diarrhea, bright red blood per rectum, melena, or hematemesis Neurologic: negative for visual changes, syncope, or dizziness All other systems reviewed and are otherwise negative except as noted above.    Blood pressure 132/70, pulse 68, height 5\' 11"  (1.803 m), weight 143 lb (64.9 kg), SpO2 99 %.  General appearance: alert and no distress Neck: no adenopathy, no carotid bruit, no JVD, supple, symmetrical, trachea midline and thyroid not enlarged, symmetric, no tenderness/mass/nodules Lungs: clear to auscultation bilaterally Heart: regular rate and rhythm, S1, S2 normal, no murmur, click, rub or gallop Extremities: extremities normal, atraumatic, no cyanosis or edema Pulses: 2+ and symmetric Skin: Skin color, texture, turgor normal. No rashes or lesions Neurologic: Alert and oriented X 3, normal strength and tone. Normal symmetric reflexes. Normal coordination and gait  EKG not performed today  ASSESSMENT AND PLAN:   Critical lower limb ischemia Mr. Gitto was referred to me for critical limb ischemia. He had a nonhealing wound on his left heel. I performed directional atherectomy and drug-eluting balloon angioplasty of his mid left SFA 01/24/17. He did have one vessel runoff via the peroneal artery. His follow-up Doppler studies performed 01/31/17 showed a widely patent SFA with a right ABI of 0.9 and a left 1.1. He was going to the wound center after that and his left heel wound has since healed. He denies  claudication.      Lorretta Harp MD FACP,FACC,FAHA, [facility] 09/02/2017 11:37 AM

## 2017-09-02 NOTE — Assessment & Plan Note (Signed)
Gregory [name] was referred to me for critical limb ischemia. He had a nonhealing wound on his left heel. I performed directional atherectomy and drug-eluting balloon angioplasty of his mid left SFA 01/24/17. He did have one vessel runoff via the peroneal artery. His follow-up Doppler studies performed 01/31/17 showed a widely patent SFA with a right ABI of 0.9 and a left 1.1. He was going to the wound center after that and his left heel wound has since healed. He denies claudication.

## 2017-09-12 ENCOUNTER — Ambulatory Visit (HOSPITAL_COMMUNITY)
Admission: RE | Admit: 2017-09-12 | Discharge: 2017-09-12 | Disposition: A | Discharge status: Home / Self Care | Payer: Medicare Other | Source: Ambulatory Visit | Attending: Cardiovascular Disease | Admitting: Cardiovascular Disease

## 2017-09-12 DIAGNOSIS — I10 Essential (primary) hypertension: Secondary | ICD-10-CM | POA: Diagnosis not present

## 2017-09-12 DIAGNOSIS — Z8673 Personal history of transient ischemic attack (TIA), and cerebral infarction without residual deficits: Secondary | ICD-10-CM | POA: Insufficient documentation

## 2017-09-12 DIAGNOSIS — E785 Hyperlipidemia, unspecified: Secondary | ICD-10-CM | POA: Insufficient documentation

## 2017-09-12 DIAGNOSIS — I2510 Atherosclerotic heart disease of native coronary artery without angina pectoris: Secondary | ICD-10-CM | POA: Diagnosis not present

## 2017-09-12 DIAGNOSIS — Z9862 Peripheral vascular angioplasty status: Secondary | ICD-10-CM | POA: Insufficient documentation

## 2017-09-12 DIAGNOSIS — I998 Other disorder of circulatory system: Secondary | ICD-10-CM

## 2017-09-12 DIAGNOSIS — I70202 Unspecified atherosclerosis of native arteries of extremities, left leg: Secondary | ICD-10-CM | POA: Diagnosis not present

## 2017-09-12 DIAGNOSIS — I70229 Atherosclerosis of native arteries of extremities with rest pain, unspecified extremity: Secondary | ICD-10-CM

## 2017-09-22 ENCOUNTER — Other Ambulatory Visit: Payer: Self-pay | Admitting: Cardiovascular Disease

## 2017-09-22 ENCOUNTER — Telehealth: Payer: Self-pay

## 2017-09-22 DIAGNOSIS — I70229 Atherosclerosis of native arteries of extremities with rest pain, unspecified extremity: Secondary | ICD-10-CM

## 2017-09-22 DIAGNOSIS — I998 Other disorder of circulatory system: Secondary | ICD-10-CM

## 2017-09-22 NOTE — Telephone Encounter (Signed)
Need more information.

## 2017-09-22 NOTE — Telephone Encounter (Signed)
Result Notes for VAS [place] LOWER EXTREMITY ARTERIAL DUPLEX   Notes recorded by Therisa Doyne on 09/22/2017 at 3:31 PM EST Results given to pt. Pt verbalized understanding. Repeat order entered.  Pt stated his legs are swelling---asked pt if taking torsemide 20 mg twice daily as this was changed at his last visit with Dr. [name]. Pt was not sure and went to look at his medicines and hung up. Sending message to Dr. [name] for advisement and will call pt back.

## 2017-10-04 ENCOUNTER — Other Ambulatory Visit: Payer: Self-pay | Admitting: Cardiovascular Disease

## 2017-10-19 ENCOUNTER — Other Ambulatory Visit: Payer: Self-pay | Admitting: Cardiovascular Disease

## 2017-11-04 ENCOUNTER — Other Ambulatory Visit: Payer: Self-pay

## 2017-11-08 ENCOUNTER — Other Ambulatory Visit: Payer: Self-pay | Admitting: Cardiovascular Disease

## 2017-11-09 ENCOUNTER — Other Ambulatory Visit: Payer: Self-pay | Admitting: Cardiovascular Disease

## 2017-11-16 ENCOUNTER — Other Ambulatory Visit: Payer: Self-pay | Admitting: Cardiovascular Disease

## 2017-11-16 NOTE — Telephone Encounter (Signed)
REFILL 

## 2017-11-28 ENCOUNTER — Other Ambulatory Visit: Payer: Self-pay | Admitting: Cardiovascular Disease

## 2017-11-29 NOTE — Telephone Encounter (Signed)
Rx has been sent to the pharmacy electronically. ° °

## 2017-12-06 ENCOUNTER — Other Ambulatory Visit: Payer: Self-pay | Admitting: Cardiovascular Disease

## 2018-01-20 ENCOUNTER — Other Ambulatory Visit: Payer: Self-pay | Admitting: Cardiovascular Disease

## 2018-01-20 NOTE — Telephone Encounter (Signed)
Rx sent to pharmacy   

## 2018-01-27 ENCOUNTER — Other Ambulatory Visit: Payer: Self-pay | Admitting: Cardiovascular Disease

## 2018-01-30 ENCOUNTER — Other Ambulatory Visit: Payer: Self-pay | Admitting: Cardiovascular Disease

## 2018-02-02 ENCOUNTER — Other Ambulatory Visit: Payer: Self-pay | Admitting: Cardiovascular Disease

## 2018-02-02 NOTE — Telephone Encounter (Signed)
Rx(s) sent to pharmacy electronically.  

## 2018-02-23 ENCOUNTER — Other Ambulatory Visit: Payer: Self-pay | Admitting: Cardiovascular Disease

## 2018-02-28 ENCOUNTER — Other Ambulatory Visit: Payer: Self-pay | Admitting: Cardiovascular Disease

## 2018-03-01 ENCOUNTER — Other Ambulatory Visit: Payer: Self-pay | Admitting: Cardiovascular Disease

## 2018-04-03 ENCOUNTER — Other Ambulatory Visit: Payer: Self-pay | Admitting: Cardiovascular Disease

## 2018-04-12 ENCOUNTER — Other Ambulatory Visit: Payer: Self-pay | Admitting: Cardiovascular Disease

## 2018-05-23 ENCOUNTER — Other Ambulatory Visit: Payer: Self-pay | Admitting: Cardiovascular Disease

## 2018-07-11 ENCOUNTER — Ambulatory Visit (INDEPENDENT_AMBULATORY_CARE_PROVIDER_SITE_OTHER): Payer: Medicare Other | Admitting: Physician Assistant

## 2018-07-11 ENCOUNTER — Encounter (INDEPENDENT_AMBULATORY_CARE_PROVIDER_SITE_OTHER): Payer: Self-pay | Admitting: Physician Assistant

## 2018-07-11 VITALS — Ht 71.0 in | Wt 143.0 lb

## 2018-07-11 DIAGNOSIS — L97922 Non-pressure chronic ulcer of unspecified part of left lower leg with fat layer exposed: Secondary | ICD-10-CM

## 2018-07-11 DIAGNOSIS — I739 Peripheral vascular disease, unspecified: Secondary | ICD-10-CM | POA: Diagnosis not present

## 2018-07-11 NOTE — Progress Notes (Signed)
Office Visit Note   Patient: Gregory [name]           Date of Birth: July 08, 1930           MRN: [id number] Visit Date: 07/11/2018              Requested by: [name], MD No address on file PCP: [name], MD  Chief Complaint  Patient presents with  . Right Leg - Wound Check  . Left Leg - Wound Check      HPI: The patient is a pleasant 84 year old male who presents with new ulcers over his left anterior calf area. The patient has a past medical history significant for left lower extremity critical limb ischemia and June 2018 and he underwent a left SFA Hawk 1 directional atherectomy followed by a drug-eluting balloon angioplasty by Dr. [name].  He had a left heel wound at the time of this revascularization procedure and this has since healed completely.  He was last seen by Dr. [name] in January 2019 and was doing well at that time and was without evidence for restenosis. He is wearing single layer compression hose today but notes that he has significant swelling over the right lower extremity greater than the left.  He also has 2 small ulcers over the anterior left calf which almost appear like skin tears.  He is on Plavix 75 mg daily and Demadex 20 mg daily.  He has a history of a STEMI and in August 2017 with moderate CAD treated medically, and paroxysmal atrial fib as well as hypertension hyperlipidemia and chronic diastolic heart failure.   Assessment & Plan: Visit Diagnoses:  1. Skin ulcer of left lower leg with fat layer exposed ([place])   2. PAD (peripheral artery disease) ([place])     Plan: The patient will be referred back to Dr. [name] for follow-up of his vascular status following revascularization procedure to the left lower extremity in 2018.  We do recommended continued compression and recommend silver compression socks 15 to 20 mmHg compression which he can apply directly over his small skin tears over the anterior left calf.  Recommend he wear compression at all  times except for showering.  He will follow-up here in 1 month or sooner should he have difficulties in the interim.  Follow-Up Instructions: Return in about 4 weeks (around 08/08/2018).   Ortho Exam  Patient is alert, oriented, no adenopathy, well-dressed, normal affect, normal respiratory effort. The right lower extremity is quite edematous and measures approximately 40 cm.  He has diffuse brawny edema with thickening of the skin and xerosis.  The left lower extremity has some minimal edema.  He does have 2 small skin tears approximately 1 cm each which are very superficial.  Pulses in the bilateral lower extremities were by Doppler only and were difficult to palpate due to the edema especially on the right side.  There is no acute cellulitis or signs of infection today.  Imaging: No results found. No images are attached to the encounter.  Labs: Lab Results  Component Value Date   HGBA1C 5.6 03/26/2016   HGBA1C 5.9 (H) 01/31/2014   ESRSEDRATE 3 04/20/2009   LABURIC 3.0 (L) 08/13/2016   REPTSTATUS 09/25/2016 FINAL 09/20/2016   GRAMSTAIN  08/06/2016    RARE WBC PRESENT, PREDOMINANTLY MONONUCLEAR NO ORGANISMS SEEN    CULT NO GROWTH 5 DAYS 09/20/2016   LABORGA KLEBSIELLA OXYTOCA 09/18/2016     Lab Results  Component Value Date  ALBUMIN 1.9 (L) 09/22/2016   ALBUMIN 2.2 (L) 09/20/2016   ALBUMIN 2.5 (L) 09/19/2016   LABURIC 3.0 (L) 08/13/2016    Body mass index is 19.94 kg/m.  Orders:  No orders of the defined types were placed in this encounter.  No orders of the defined types were placed in this encounter.    Procedures: No procedures performed  Clinical Data: No additional findings.  ROS:  All other systems negative, except as noted in the HPI. Review of Systems  Objective: Vital Signs: Ht 5\' 11"  (1.803 m)   Wt 143 lb (64.9 kg)   BMI 19.94 kg/m   Specialty Comments:  No specialty comments available.  PMFS History: Patient Active Problem List    Diagnosis Date Noted  . Critical lower limb ischemia 01/11/2017  . DNR (do not resuscitate)   . DNR (do not resuscitate) discussion 09/19/2016  . Palliative care by specialist 09/19/2016  . Bacteremia due to Gram-negative bacteria 09/19/2016  . Cholelithiases 09/19/2016  . Gallstone pancreatitis: Probable 09/19/2016  . Biliary colic: Probable 10/62/6948  . Abnormal LFTs   . Pressure injury of skin 09/18/2016  . Protein-calorie malnutrition, severe 09/18/2016  . Abdominal pain 09/17/2016  . Adult failure to thrive 08/24/2016  . Persistent atrial fibrillation   . Anticoagulated   . Difficulty in walking, not elsewhere classified   . Melanotic stools 08/14/2016  . Postoperative anemia due to acute blood loss 08/14/2016  . Pericardial effusion   . Hypokalemia   . Malnutrition of moderate degree 08/06/2016  . Hypotension   . Cardiac tamponade   . Chronic diastolic congestive heart failure ([place]) 08/05/2016  . Incarcerated inguinal hernia 08/05/2016  . Incarcerated left inguinal hernia 08/05/2016  . Hypercholesteremia   . Hypertensive heart disease   . Coronary artery disease   . LVH (left ventricular hypertrophy)   . PAF (paroxysmal atrial fibrillation) ([place])   . NSTEMI (non-ST elevated myocardial infarction) ([place]) 03/26/2016  . Acute coronary syndrome ([place])   . GI bleed 10/03/2014  . H/O: CVA (cerebrovascular accident)   . CVA (cerebral infarction) 01/30/2014  . Right arm weakness 01/30/2014  . Hypertension 12/05/2010  . Hyperlipidemia 12/05/2010   Past Medical History:  Diagnosis Date  . Arthritis    "legs, knees" (08/05/2016)  . CAD (coronary artery disease)    a. cath 03/2016: diffuse disease w/ 50% ostial LM stenosis, 75% ostial Cx, 50% 3rd Mrg, 65% distal RCA, and 50% RPDA. Medical management recommended.  . Critical lower limb ischemia 01/24/2017  . GIB (gastrointestinal bleeding)    a. occurred in 2016, secondary to gastritis and diverticulosis  . History of gout     . History of stomach ulcers   . Hypercholesteremia   . Hypertension   . Hypertensive heart disease   . LVH (left ventricular hypertrophy)    a. 03/2016 Echo: EF 55-65%, no rwma, Gr1 DD, sev LVH, mildly dil RA/LA, small peric eff;  b. 03/2016 Cardiac MRI: sev LVH, mildly dil RV, possible RV thrombus, diffuse LGE involving RV and diff subendocardial LGE in LV. *No M spike on SPEP, nl immunofixation pattern - no clear evidence of cardiac amyloidosis.  . Murmur, cardiac   . PAF (paroxysmal atrial fibrillation) ([place])    a. new-onset 03/2016, placed on Eliquis (CHA2DS2VASc = 6).  . Pericardial effusion    a. diagnosed in 07/2016. Underwent pericardiocentesis at that time.   . Pneumonia    "quite a few times" (08/05/2016)  . Poor appetite   . Prostate  cancer ([place])   . STEMI (ST elevation myocardial infarction) ([place]) 03/2016   Archie Endo 03/26/2016  . Stroke [facility])    a. nonhemorrhagic CVA in 2015    Family History  Problem Relation Age of Onset  . Hypertension Mother     Past Surgical History:  Procedure Laterality Date  . ABDOMINAL AORTOGRAM W/LOWER EXTREMITY N/A 01/24/2017   Procedure: Abdominal Aortogram w/Lower Extremity;  Surgeon: Lorretta Harp, MD;  Location: [place] [facility];  Service: Cardiovascular;  Laterality: N/A;  . CARDIAC CATHETERIZATION N/A 03/26/2016   Procedure: Left Heart Cath and Coronary Angiography;  Surgeon: Troy Sine, MD;  Location: [place] [facility];  Service: Cardiovascular;  Laterality: N/A;  . CARDIAC CATHETERIZATION N/A 08/06/2016   Procedure: Pericardiocentesis;  Surgeon: Sherren Mocha, MD;  Location: [place] [facility];  Service: Cardiovascular;  Laterality: N/A;  . COLONOSCOPY WITH PROPOFOL N/A 10/04/2014   Procedure: COLONOSCOPY WITH PROPOFOL;  Surgeon: Laurence Spates, MD;  Location: [place];  Service: Endoscopy;  Laterality: N/A;  . DIAGNOSTIC LAPAROSCOPY  08/05/2016   reduction of strangulated small bowel from left inguinal hernia, TAPP repair  with phasix mesh, small bowel resection/notes 08/05/2016  . ESOPHAGOGASTRODUODENOSCOPY N/A 10/03/2014   Procedure: ESOPHAGOGASTRODUODENOSCOPY (EGD);  Surgeon: Winfield Cunas., MD;  Location[facility] END[facility];  Service: Endoscopy;  Laterality: N/A;  . HERNIA REPAIR    . LAPAROSCOPY N/A 08/05/2016   Procedure: LAPAROSCOPY DIAGNOSTIC AND LAPAROSCOPIC INGUINAL HERNIA REPAIR.;  Surgeon: Clovis Riley, MD;  Location: [place];  Service: General;  Laterality: N/A;  . LAPAROTOMY N/A 08/05/2016   Procedure: Anne Fu WITH  SMALL BOWEL RESECTION.;  Surgeon: Clovis Riley, MD;  Location: [place];  Service: General;  Laterality: N/A;  . PERIPHERAL VASCULAR ATHERECTOMY  01/24/2017   Procedure: Peripheral Vascular Atherectomy;  Surgeon: Lorretta Harp, MD;  Location: [place] [facility];  Service: Cardiovascular;;  Lt SFA  . PROSTATECTOMY  1990s?   Social History   Occupational History  . Not on file  Tobacco Use  . Smoking status: Never Smoker  . Smokeless tobacco: Never Used  Substance and Sexual Activity  . Alcohol use: No    Comment: 08/05/2016 "quit drinking in the 1970s"  . Drug use: No  . Sexual activity: Yes

## 2018-07-12 ENCOUNTER — Ambulatory Visit: Payer: Medicare Other | Admitting: Cardiovascular Disease

## 2018-07-12 ENCOUNTER — Encounter: Payer: Self-pay | Admitting: Cardiovascular Disease

## 2018-07-12 VITALS — BP 122/70 | HR 72 | Ht 71.0 in | Wt 150.0 lb

## 2018-07-12 DIAGNOSIS — I480 Paroxysmal atrial fibrillation: Secondary | ICD-10-CM

## 2018-07-12 DIAGNOSIS — I70229 Atherosclerosis of native arteries of extremities with rest pain, unspecified extremity: Secondary | ICD-10-CM

## 2018-07-12 DIAGNOSIS — I998 Other disorder of circulatory system: Secondary | ICD-10-CM | POA: Diagnosis not present

## 2018-07-12 NOTE — Progress Notes (Signed)
07/12/2018 Gregory [name]   [birth year]  [id number]  Primary Physician Janie Morning, DO Primary Cardiologist: Lorretta Harp MD Lupe Carney, [place]  HPI:  Gregory [name] is a 84 y.o.  frail appearing African-American male followed by Dr. [name].I last saw him in the office  09/02/2017. He has ahistory of ischemic heart disease status post STEMI 8/17 with moderate CAD treated medically. He also has a history of PAF on Eliquis, hypertension, hyperlipidemia and chronic diastolic heart failure. He has asymmetric lower extremity edema right greater than left. He also has a nonhealing ulcer on his left heel which she's had for last 3 months. Lower extremity until Doppler studies performed by/11/18 revealed a right ABI of 0.91 and left of 0.95. He did have a high-frequency signal in his distal left SFA. He's also been referred to the wound care center for aggressive local wound care. Heunderwent lower extremity angiography by myself 01/24/17 because of a nonhealing wound on his left heel consistent with critical limb ischemia. I performed Hawk1 directional Atherectomy followed by drug-eluting balloon angioplasty of a 90% segmental mid left SFA stenosis. He had 1 vessel runoff below the knee via peroneal artery. Follow-up Dopplers performed 01/31/17 revealed a widely patent SFA with a left ABI 1.1. His left heel wound has subsequently healed.   Since I saw him a year ago his left heel ulcer continues to have healed.  His Dopplers performed 09/13/2017 revealed a right ABI of 0.91 and left 1.15.  His right SFA remains widely patent.  He is wearing compression stockings bilaterally for lower extremity edema which is chronic and he apparently has several venous stasis ulcers on the left side.  Current Meds  Medication Sig  . amiodarone (PACERONE) 100 MG tablet Take 1 tablet (100 mg total) by mouth daily.  Marland Kitchen amiodarone (PACERONE) 200 MG tablet TAKE (1/2) TABLET DAILY.  Marland Kitchen amLODipine (NORVASC) 5 MG  tablet TAKE 1 TABLET EACH DAY.  Marland Kitchen aspirin EC 81 MG EC tablet Take 1 tablet (81 mg total) by mouth daily.  Marland Kitchen atorvastatin (LIPITOR) 40 MG tablet TAKE 1 TABLET BY MOUTH DAILY.  . brimonidine (ALPHAGAN) 0.2 % ophthalmic solution Place 1 drop into both eyes at bedtime.  . clopidogrel (PLAVIX) 75 MG tablet TAKE 1 TABLET DAILY WITH BREAKFAST.  Marland Kitchen colchicine 0.6 MG tablet Take 1 tablet (0.6 mg total) by mouth daily.  . cyproheptadine (PERIACTIN) 4 MG tablet TAKE ONE TABLET AT BEDTIME.  Marland Kitchen ELIQUIS 5 MG TABS tablet TAKE 1 TABLET BY MOUTH TWICE DAILY.  Marland Kitchen levothyroxine (SYNTHROID, LEVOTHROID) 75 MCG tablet TAKE 1 TABLET ONCE DAILY.  . metoprolol (LOPRESSOR) 100 MG tablet Take 1 tablet (100 mg total) by mouth 2 (two) times daily. (Patient taking differently: Take 100 mg by mouth daily. )  . mirtazapine (REMERON SOL-TAB) 15 MG disintegrating tablet Take 0.5 tablets (7.5 mg total) by mouth at bedtime.  . mirtazapine (REMERON) 15 MG tablet TAKE 1/2 TABLET AT BEDTIME.  . Multiple Vitamin (MULTIVITAMIN WITH MINERALS) TABS tablet Take 1 tablet by mouth daily.  . ondansetron (ZOFRAN-ODT) 4 MG disintegrating tablet Take 1 tablet (4 mg total) by mouth every 6 (six) hours as needed for nausea.  . pantoprazole (PROTONIX) 40 MG tablet Take 1 tablet (40 mg total) by mouth daily.  . potassium chloride (K-DUR) 10 MEQ tablet TAKE 1 TABLET EACH DAY.  . sertraline (ZOLOFT) 50 MG tablet Take 1 tablet (50 mg total) by mouth daily.  Marland Kitchen torsemide (DEMADEX) 20 MG  tablet Take 1 tablet (20 mg total) by mouth 2 (two) times daily.     Allergies  Allergen Reactions  . Sulfa Antibiotics Rash  . Ramipril     unknown    Social History   Socioeconomic History  . Marital status: Divorced    Spouse name: Not on file  . Number of children: Not on file  . Years of education: Not on file  . Highest education level: Not on file  Occupational History  . Not on file  Social Needs  . Financial resource strain: Not on file  . Food  insecurity:    Worry: Not on file    Inability: Not on file  . Transportation needs:    Medical: Not on file    Non-medical: Not on file  Tobacco Use  . Smoking status: Never Smoker  . Smokeless tobacco: Never Used  Substance and Sexual Activity  . Alcohol use: No    Comment: 08/05/2016 "quit drinking in the 1970s"  . Drug use: No  . Sexual activity: Yes  Lifestyle  . Physical activity:    Days per week: Not on file    Minutes per session: Not on file  . Stress: Not on file  Relationships  . Social connections:    Talks on phone: Not on file    Gets together: Not on file    Attends religious service: Not on file    Active member of club or organization: Not on file    Attends meetings of clubs or organizations: Not on file    Relationship status: Not on file  . Intimate partner violence:    Fear of current or ex partner: Not on file    Emotionally abused: Not on file    Physically abused: Not on file    Forced sexual activity: Not on file  Other Topics Concern  . Not on file  Social History Narrative  . Not on file     Review of Systems: General: negative for chills, fever, night sweats or weight changes.  Cardiovascular: negative for chest pain, dyspnea on exertion, edema, orthopnea, palpitations, paroxysmal nocturnal dyspnea or shortness of breath Dermatological: negative for rash Respiratory: negative for cough or wheezing Urologic: negative for hematuria Abdominal: negative for nausea, vomiting, diarrhea, bright red blood per rectum, melena, or hematemesis Neurologic: negative for visual changes, syncope, or dizziness All other systems reviewed and are otherwise negative except as noted above.    Blood pressure 122/70, pulse 72, height 5\' 11"  (1.803 m), weight 150 lb (68 kg).  General appearance: alert and no distress Neck: no adenopathy, no carotid bruit, no JVD, supple, symmetrical, trachea midline and thyroid not enlarged, symmetric, no  tenderness/mass/nodules Lungs: clear to auscultation bilaterally Heart: regular rate and rhythm, S1, S2 normal, no murmur, click, rub or gallop Extremities: extremities normal, atraumatic, no cyanosis or edema Pulses: 2+ and symmetric Skin: 2 venous stasis ulcers left shin Neurologic: Alert and oriented X 3, normal strength and tone. Normal symmetric reflexes. Normal coordination and gait  EKG sinus rhythm at 72 with right bundle branch block.  Personally reviewed this EKG.  ASSESSMENT AND PLAN:   Critical lower limb ischemia History of critical limb ischemia status post endovascular therapy 01/24/2017 with directional atherectomy and drug-eluting balloon angioplasty of his mid left SFA with one-vessel runoff via peroneal artery.  His most recent Dopplers performed 09/13/2017 revealed a right ABI 0.91 and a left of 1.15 patent patent left SFA.  His left heel ulcer has  healed.  He does wear compression stockings and has some venous stasis ulcers on his left shin.      Lorretta Harp MD FACP,FACC,FAHA, [facility] 07/12/2018 9:32 AM

## 2018-07-12 NOTE — Assessment & Plan Note (Signed)
History of critical limb ischemia status post endovascular therapy 01/24/2017 with directional atherectomy and drug-eluting balloon angioplasty of his mid left SFA with one-vessel runoff via peroneal artery.  His most recent Dopplers performed 09/13/2017 revealed a right ABI 0.91 and a left of 1.15 patent patent left SFA.  His left heel ulcer has healed.  He does wear compression stockings and has some venous stasis ulcers on his left shin.

## 2018-07-12 NOTE — Patient Instructions (Signed)
Medication Instructions:  NONE If you need a refill on your cardiac medications before your next appointment, please call your pharmacy.   Lab work: NONE If you have labs (blood work) drawn today and your tests are completely normal, you will receive your results only by: Marland Kitchen MyChart Message (if you have MyChart) OR . A paper copy in the mail If you have any lab test that is abnormal or we need to change your treatment, we will call you to review the results.  Testing/Procedures: Your physician has requested that you have a lower extremity arterial duplex. During this test, ultrasound IS used to evaluate arterial blood flow in the legs. Allow one hour for this exam. There are no restrictions or special instructions.  SCHEDULE IN January 2020  Follow-Up:  Your physician recommends that you schedule a follow-up appointment AS NEEDED

## 2018-07-29 ENCOUNTER — Emergency Department (HOSPITAL_COMMUNITY): Payer: Medicare Other

## 2018-07-29 ENCOUNTER — Ambulatory Visit (HOSPITAL_COMMUNITY)
Admission: EM | Admit: 2018-07-29 | Discharge: 2018-07-29 | Disposition: A | Discharge status: Home / Self Care | Payer: Medicare Other | Source: Home / Self Care

## 2018-07-29 ENCOUNTER — Emergency Department (HOSPITAL_COMMUNITY)
Admission: EM | Admit: 2018-07-29 | Discharge: 2018-07-29 | Disposition: A | Discharge status: Home / Self Care | Payer: Medicare Other | Attending: Emergency Medicine | Admitting: Emergency Medicine

## 2018-07-29 ENCOUNTER — Encounter (HOSPITAL_COMMUNITY): Payer: Self-pay | Admitting: Emergency Medicine

## 2018-07-29 ENCOUNTER — Encounter (HOSPITAL_COMMUNITY): Payer: Self-pay

## 2018-07-29 DIAGNOSIS — I480 Paroxysmal atrial fibrillation: Secondary | ICD-10-CM | POA: Insufficient documentation

## 2018-07-29 DIAGNOSIS — Z7902 Long term (current) use of antithrombotics/antiplatelets: Secondary | ICD-10-CM | POA: Insufficient documentation

## 2018-07-29 DIAGNOSIS — I252 Old myocardial infarction: Secondary | ICD-10-CM | POA: Diagnosis not present

## 2018-07-29 DIAGNOSIS — E7800 Pure hypercholesterolemia, unspecified: Secondary | ICD-10-CM | POA: Insufficient documentation

## 2018-07-29 DIAGNOSIS — Z79899 Other long term (current) drug therapy: Secondary | ICD-10-CM | POA: Diagnosis not present

## 2018-07-29 DIAGNOSIS — I2510 Atherosclerotic heart disease of native coronary artery without angina pectoris: Secondary | ICD-10-CM | POA: Insufficient documentation

## 2018-07-29 DIAGNOSIS — I5032 Chronic diastolic (congestive) heart failure: Secondary | ICD-10-CM | POA: Insufficient documentation

## 2018-07-29 DIAGNOSIS — Z7982 Long term (current) use of aspirin: Secondary | ICD-10-CM | POA: Diagnosis not present

## 2018-07-29 DIAGNOSIS — Z8546 Personal history of malignant neoplasm of prostate: Secondary | ICD-10-CM | POA: Insufficient documentation

## 2018-07-29 DIAGNOSIS — I110 Hypertensive heart disease with heart failure: Secondary | ICD-10-CM | POA: Diagnosis not present

## 2018-07-29 DIAGNOSIS — Z8673 Personal history of transient ischemic attack (TIA), and cerebral infarction without residual deficits: Secondary | ICD-10-CM | POA: Insufficient documentation

## 2018-07-29 DIAGNOSIS — R600 Localized edema: Secondary | ICD-10-CM | POA: Insufficient documentation

## 2018-07-29 HISTORY — DX: Heart failure, unspecified: I50.9

## 2018-07-29 LAB — CBC WITH DIFFERENTIAL/PLATELET
ABS IMMATURE GRANULOCYTES: 0.02 10*3/uL (ref 0.00–0.07)
BASOS ABS: 0 10*3/uL (ref 0.0–0.1)
BASOS PCT: 1 %
EOS ABS: 0.2 10*3/uL (ref 0.0–0.5)
Eosinophils Relative: 3 %
HCT: 34.8 % — ABNORMAL LOW (ref 39.0–52.0)
Hemoglobin: 10.7 g/dL — ABNORMAL LOW (ref 13.0–17.0)
IMMATURE GRANULOCYTES: 0 %
LYMPHS ABS: 1.4 10*3/uL (ref 0.7–4.0)
LYMPHS PCT: 22 %
MCH: 27.4 pg (ref 26.0–34.0)
MCHC: 30.7 g/dL (ref 30.0–36.0)
MCV: 89 fL (ref 80.0–100.0)
Monocytes Absolute: 0.6 10*3/uL (ref 0.1–1.0)
Monocytes Relative: 9 %
Neutro Abs: 4.4 10*3/uL (ref 1.7–7.7)
Neutrophils Relative %: 65 %
PLATELETS: 169 10*3/uL (ref 150–400)
RBC: 3.91 MIL/uL — AB (ref 4.22–5.81)
RDW: 17.7 % — AB (ref 11.5–15.5)
WBC: 6.7 10*3/uL (ref 4.0–10.5)
nRBC: 0 % (ref 0.0–0.2)

## 2018-07-29 LAB — BASIC METABOLIC PANEL
ANION GAP: 9 (ref 5–15)
BUN: 18 mg/dL (ref 8–23)
CALCIUM: 8.8 mg/dL — AB (ref 8.9–10.3)
CO2: 31 mmol/L (ref 22–32)
Chloride: 98 mmol/L (ref 98–111)
Creatinine, Ser: 1.25 mg/dL — ABNORMAL HIGH (ref 0.61–1.24)
GFR, EST NON AFRICAN AMERICAN: 52 mL/min — AB (ref >60)
Glucose, Bld: 114 mg/dL — ABNORMAL HIGH (ref 70–99)
POTASSIUM: 3.8 mmol/L (ref 3.5–5.1)
SODIUM: 138 mmol/L (ref 135–145)

## 2018-07-29 MED ORDER — FUROSEMIDE 20 MG PO TABS: 40.0000 mg | ORAL_TABLET | Freq: Every day | ORAL | 0 refills | Status: AC

## 2018-07-29 MED ORDER — FUROSEMIDE 20 MG PO TABS: 20.0000 mg | ORAL_TABLET | Freq: Once | ORAL | Status: DC

## 2018-07-29 MED ORDER — POTASSIUM CHLORIDE CRYS ER 20 MEQ PO TBCR: 20.0000 meq | EXTENDED_RELEASE_TABLET | Freq: Every day | ORAL | 0 refills | Status: AC

## 2018-07-29 NOTE — ED Provider Notes (Signed)
[facility] EMERGENCY DEPARTMENT Provider Note   CSN: [id number] Arrival date & time: 07/29/18  1403     History   Chief Complaint Chief Complaint  Patient presents with  . Leg Swelling    HPI Gregory [name] is a 84 y.o. male with history of CAD, CHF, hypertension, limb ischemia, stroke presenting today for bilateral leg swelling.  Patient states that he has had intermittent bilateral lower leg swelling for greater than 1 year, he states that he has had increased swelling over the past 2 weeks.  Patient denies trauma or injury to the legs.  Patient states that he is feeling well and is without pain. Patient states that he was seen by his primary care provider recently who informed him to use elevation and compression stockings which she has been doing without relief.  Patient states that he has developed draining areas to his anterior left lower leg for the past few weeks, denies fever or associated pain.  Patient denies chest pain, shortness of breath, cough, pain, numbness/tingling or weakness, abdominal pain, nausea/vomiting or fever. HPI  Past Medical History:  Diagnosis Date  . Arthritis    "legs, knees" (08/05/2016)  . CAD (coronary artery disease)    a. cath 03/2016: diffuse disease w/ 50% ostial LM stenosis, 75% ostial Cx, 50% 3rd Mrg, 65% distal RCA, and 50% RPDA. Medical management recommended.  . CHF (congestive heart failure) ([place])   . Critical lower limb ischemia 01/24/2017  . GIB (gastrointestinal bleeding)    a. occurred in 2016, secondary to gastritis and diverticulosis  . History of gout   . History of stomach ulcers   . Hypercholesteremia   . Hypertension   . Hypertensive heart disease   . LVH (left ventricular hypertrophy)    a. 03/2016 Echo: EF 55-65%, no rwma, Gr1 DD, sev LVH, mildly dil RA/LA, small peric eff;  b. 03/2016 Cardiac MRI: sev LVH, mildly dil RV, possible RV thrombus, diffuse LGE involving RV and diff subendocardial LGE in LV.  *No M spike on SPEP, nl immunofixation pattern - no clear evidence of cardiac amyloidosis.  . Murmur, cardiac   . PAF (paroxysmal atrial fibrillation) ([place])    a. new-onset 03/2016, placed on Eliquis (CHA2DS2VASc = 6).  . Pericardial effusion    a. diagnosed in 07/2016. Underwent pericardiocentesis at that time.   . Pneumonia    "quite a few times" (08/05/2016)  . Poor appetite   . Prostate cancer ([place])   . STEMI (ST elevation myocardial infarction) ([place]) 03/2016   Archie Endo 03/26/2016  . Stroke [facility])    a. nonhemorrhagic CVA in 2015    Patient Active Problem List   Diagnosis Date Noted  . Critical lower limb ischemia 01/11/2017  . DNR (do not resuscitate)   . DNR (do not resuscitate) discussion 09/19/2016  . Palliative care by specialist 09/19/2016  . Bacteremia due to Gram-negative bacteria 09/19/2016  . Cholelithiases 09/19/2016  . Gallstone pancreatitis: Probable 09/19/2016  . Biliary colic: Probable 98/33/8250  . Abnormal LFTs   . Pressure injury of skin 09/18/2016  . Protein-calorie malnutrition, severe 09/18/2016  . Abdominal pain 09/17/2016  . Adult failure to thrive 08/24/2016  . Persistent atrial fibrillation   . Anticoagulated   . Difficulty in walking, not elsewhere classified   . Melanotic stools 08/14/2016  . Postoperative anemia due to acute blood loss 08/14/2016  . Pericardial effusion   . Hypokalemia   . Malnutrition of moderate degree 08/06/2016  . Hypotension   .  Cardiac tamponade   . Chronic diastolic congestive heart failure ([place]) 08/05/2016  . Incarcerated inguinal hernia 08/05/2016  . Incarcerated left inguinal hernia 08/05/2016  . Hypercholesteremia   . Hypertensive heart disease   . Coronary artery disease   . LVH (left ventricular hypertrophy)   . PAF (paroxysmal atrial fibrillation) ([place])   . NSTEMI (non-ST elevated myocardial infarction) ([place]) 03/26/2016  . Acute coronary syndrome ([place])   . GI bleed 10/03/2014  . H/O: CVA (cerebrovascular  accident)   . CVA (cerebral infarction) 01/30/2014  . Right arm weakness 01/30/2014  . Hypertension 12/05/2010  . Hyperlipidemia 12/05/2010    Past Surgical History:  Procedure Laterality Date  . ABDOMINAL AORTOGRAM W/LOWER EXTREMITY N/A 01/24/2017   Procedure: Abdominal Aortogram w/Lower Extremity;  Surgeon: Lorretta Harp, MD;  Location: [facility] CV LAB;  Service: Cardiovascular;  Laterality: N/A;  . CARDIAC CATHETERIZATION N/A 03/26/2016   Procedure: Left Heart Cath and Coronary Angiography;  Surgeon: Troy Sine, MD;  Location: [facility] CV LAB;  Service: Cardiovascular;  Laterality: N/A;  . CARDIAC CATHETERIZATION N/A 08/06/2016   Procedure: Pericardiocentesis;  Surgeon: Sherren Mocha, MD;  Location: [facility] CV LAB;  Service: Cardiovascular;  Laterality: N/A;  . COLONOSCOPY WITH PROPOFOL N/A 10/04/2014   Procedure: COLONOSCOPY WITH PROPOFOL;  Surgeon: Laurence Spates, MD;  Location: [facility];  Service: Endoscopy;  Laterality: N/A;  . DIAGNOSTIC LAPAROSCOPY  08/05/2016   reduction of strangulated small bowel from left inguinal hernia, TAPP repair with phasix mesh, small bowel resection/notes 08/05/2016  . ESOPHAGOGASTRODUODENOSCOPY N/A 10/03/2014   Procedure: ESOPHAGOGASTRODUODENOSCOPY (EGD);  Surgeon: Winfield Cunas., MD;  Location: [facility] ENDOSCOPY;  Service: Endoscopy;  Laterality: N/A;  . HERNIA REPAIR    . LAPAROSCOPY N/A 08/05/2016   Procedure: LAPAROSCOPY DIAGNOSTIC AND LAPAROSCOPIC INGUINAL HERNIA REPAIR.;  Surgeon: Clovis Riley, MD;  Location: [facility];  Service: General;  Laterality: N/A;  . LAPAROTOMY N/A 08/05/2016   Procedure: Anne Fu WITH  SMALL BOWEL RESECTION.;  Surgeon: Clovis Riley, MD;  Location: [facility];  Service: General;  Laterality: N/A;  . PERIPHERAL VASCULAR ATHERECTOMY  01/24/2017   Procedure: Peripheral Vascular Atherectomy;  Surgeon: Lorretta Harp, MD;  Location: [facility];  Service: Cardiovascular;;  Lt SFA  .  PROSTATECTOMY  1990s?        Home Medications    Prior to Admission medications   Medication Sig Start Date End Date Taking? Authorizing Provider  amiodarone (PACERONE) 100 MG tablet Take 1 tablet (100 mg total) by mouth daily. 06/03/17   Troy Sine, MD  amiodarone (PACERONE) 200 MG tablet TAKE (1/2) TABLET DAILY. 02/23/18   Troy Sine, MD  amLODipine (NORVASC) 5 MG tablet TAKE 1 TABLET EACH DAY. 03/01/18   Lorretta Harp, MD  aspirin EC 81 MG EC tablet Take 1 tablet (81 mg total) by mouth daily. 01/26/17   Delos Haring, PA-C  atorvastatin (LIPITOR) 40 MG tablet TAKE 1 TABLET BY MOUTH DAILY. 01/30/18   Troy Sine, MD  brimonidine (ALPHAGAN) 0.2 % ophthalmic solution Place 1 drop into both eyes at bedtime. 09/23/16   Ghimire, Henreitta Leber, MD  clopidogrel (PLAVIX) 75 MG tablet TAKE 1 TABLET DAILY WITH BREAKFAST. 11/29/17   Troy Sine, MD  colchicine 0.6 MG tablet Take 1 tablet (0.6 mg total) by mouth daily. 08/22/16   Rai, Vernelle Emerald, MD  cyproheptadine (PERIACTIN) 4 MG tablet TAKE ONE TABLET AT BEDTIME. 11/16/17   Troy Sine, MD  Arne Cleveland  5 MG TABS tablet TAKE 1 TABLET BY MOUTH TWICE DAILY. 11/29/17   Troy Sine, MD  furosemide (LASIX) 20 MG tablet Take 2 tablets (40 mg total) by mouth daily for 3 days. 07/29/18 08/01/18  Nuala Alpha A, PA-C  levothyroxine (SYNTHROID, LEVOTHROID) 75 MCG tablet TAKE 1 TABLET ONCE DAILY. 11/16/17   Troy Sine, MD  metoprolol (LOPRESSOR) 100 MG tablet Take 1 tablet (100 mg total) by mouth 2 (two) times daily. Patient taking differently: Take 100 mg by mouth daily.  09/23/16   Ghimire, Henreitta Leber, MD  mirtazapine (REMERON SOL-TAB) 15 MG disintegrating tablet Take 0.5 tablets (7.5 mg total) by mouth at bedtime. 09/23/16   Jonetta Osgood, MD  mirtazapine (REMERON) 15 MG tablet TAKE 1/2 TABLET AT BEDTIME. 04/03/18   Lorretta Harp, MD  Multiple Vitamin (MULTIVITAMIN WITH MINERALS) TABS tablet Take 1 tablet by mouth daily. 08/22/16   Rai,  Ripudeep K, MD  ondansetron (ZOFRAN-ODT) 4 MG disintegrating tablet Take 1 tablet (4 mg total) by mouth every 6 (six) hours as needed for nausea. 08/22/16   Rai, Ripudeep K, MD  pantoprazole (PROTONIX) 40 MG tablet Take 1 tablet (40 mg total) by mouth daily. 02/02/18   Lorretta Harp, MD  potassium chloride (K-DUR) 10 MEQ tablet TAKE 1 TABLET EACH DAY. 04/28/17   Troy Sine, MD  potassium chloride SA (K-DUR,KLOR-CON) 20 MEQ tablet Take 1 tablet (20 mEq total) by mouth daily for 3 days. 07/29/18 08/01/18  Nuala Alpha A, PA-C  sertraline (ZOLOFT) 50 MG tablet Take 1 tablet (50 mg total) by mouth daily. 09/23/16   Ghimire, Henreitta Leber, MD  torsemide (DEMADEX) 20 MG tablet Take 1 tablet (20 mg total) by mouth 2 (two) times daily. 06/03/17   Troy Sine, MD    Family History Family History  Problem Relation Age of Onset  . Hypertension Mother     Social History Social History   Tobacco Use  . Smoking status: Never Smoker  . Smokeless tobacco: Never Used  Substance Use Topics  . Alcohol use: No    Comment: 08/05/2016 "quit drinking in the 1970s"  . Drug use: No     Allergies   Sulfa antibiotics and Ramipril   Review of Systems Review of Systems  Constitutional: Negative.  Negative for chills and fever.  HENT: Negative.  Negative for rhinorrhea and sore throat.   Eyes: Negative.  Negative for visual disturbance.  Respiratory: Negative.  Negative for cough and shortness of breath.   Cardiovascular: Positive for leg swelling. Negative for chest pain.  Gastrointestinal: Negative.  Negative for abdominal pain, blood in stool, diarrhea, nausea and vomiting.  Genitourinary: Negative.  Negative for dysuria and hematuria.  Musculoskeletal: Negative.  Negative for arthralgias and myalgias.  Skin: Negative.  Negative for rash.  Neurological: Negative.  Negative for dizziness, weakness and headaches.   Physical Exam Updated Vital Signs BP 121/74   Pulse 69   Temp 98.1 F (36.7  C) (Oral)   Resp 16   Ht 5\' 11"  (1.803 m)   Wt 68 kg   SpO2 99%   BMI 20.91 kg/m   Physical Exam Constitutional:      General: He is not in acute distress.    Appearance: He is well-developed. He is not ill-appearing.  HENT:     Head: Normocephalic and atraumatic.     Right Ear: External ear normal.     Left Ear: External ear normal.     Nose: Nose  normal.  Eyes:     Pupils: Pupils are equal, round, and reactive to light.  Neck:     Musculoskeletal: Normal range of motion.     Trachea: Trachea normal. No tracheal deviation.  Cardiovascular:     Rate and Rhythm: Normal rate and regular rhythm.     Pulses:          Dorsalis pedis pulses are 1+ on the right side and 1+ on the left side.       Posterior tibial pulses are 1+ on the right side and 1+ on the left side.     Heart sounds: Normal heart sounds.  Pulmonary:     Effort: Pulmonary effort is normal. No respiratory distress.     Breath sounds: Normal breath sounds. No rales.  Abdominal:     Palpations: Abdomen is soft.     Tenderness: There is no abdominal tenderness. There is no guarding or rebound.  Musculoskeletal: Normal range of motion.     Right lower leg: He exhibits no tenderness. Edema present.     Left lower leg: He exhibits no tenderness. Edema present.     Comments: Patient with bilateral equal 2+ pitting edema.  Multiple small draining areas to left anterior thigh, serosanguineous drainage.  No overt surrounding cellulitis.  No induration.  No tenderness to the lower extremities, negative Homans sign.  Patient neurovascular intact to bilateral lower extremities.  Feet:     Right foot:     Protective Sensation: 3 sites tested. 3 sites sensed.     Left foot:     Protective Sensation: 3 sites tested. 3 sites sensed.  Skin:    General: Skin is warm and dry.     Capillary Refill: Capillary refill takes less than 2 seconds.  Neurological:     General: No focal deficit present.     Mental Status: He is  alert.     GCS: GCS eye subscore is 4. GCS verbal subscore is 5. GCS motor subscore is 6.     Comments: Speech is clear and goal oriented, follows commands Major Cranial nerves without deficit, no facial droop Normal strength in upper and lower extremities bilaterally including dorsiflexion and plantar flexion, strong and equal grip strength Sensation normal to light touch Moves extremities without ataxia, coordination intact  Psychiatric:        Mood and Affect: Mood normal.        Behavior: Behavior normal.    ED Treatments / Results  Labs (all labs ordered are listed, but only abnormal results are displayed) Labs Reviewed  CBC WITH DIFFERENTIAL/PLATELET - Abnormal; Notable for the following components:      Result Value   RBC 3.91 (*)    Hemoglobin 10.7 (*)    HCT 34.8 (*)    RDW 17.7 (*)    All other components within normal limits  BASIC METABOLIC PANEL - Abnormal; Notable for the following components:   Glucose, Bld 114 (*)    Creatinine, Ser 1.25 (*)    Calcium 8.8 (*)    GFR calc non Af Amer 52 (*)    All other components within normal limits    EKG EKG Interpretation  Date/Time:  Saturday July 29 2018 15:34:28 EST Ventricular Rate:  69 PR Interval:    QRS Duration: 177 QT Interval:  506 QTC Calculation: 543 R Axis:   -83 Text Interpretation:  Sinus rhythm Prolonged PR interval Right bundle branch block Confirmed by Dene Gentry [phone]) on 07/29/2018 3:37:52 PM  Radiology Dg Chest 2 View  Result Date: 07/29/2018 CLINICAL DATA:  Bilateral lower extremity swelling for the past several days. EXAM: CHEST - 2 VIEW COMPARISON:  PA and lateral chest 01/20/2017.  CT chest 03/27/2016. FINDINGS: There is cardiomegaly without edema. No consolidative process, pneumothorax or effusion. Aortic atherosclerosis is noted. Elevation of the right hemidiaphragm relative to the left is unchanged. IMPRESSION: No acute disease. Cardiomegaly. Atherosclerosis. Electronically  Signed   By: Inge Rise M.D.   On: 07/29/2018 15:30    Procedures Procedures (including critical care time)  Medications Ordered in ED Medications - No data to display   Initial Impression / Assessment and Plan / ED Course  I have reviewed the triage vital signs and the nursing notes.  Pertinent labs & imaging results that were available during my care of the patient were reviewed by me and considered in my medical decision making (see chart for details).    84 year old male with significant cardiac medical history including CHF presenting today for increasing leg swelling over the past year.  Denies history of chest pain or shortness of breath.  Examination today shows equal bilateral lower extremity swelling, 2+ pitting edema.  Mild serosanguineous drainage to the left anterior leg, no overt infection, no overt cellulitis present. Extremity neurovascularly intact; no signs of infection, septic joint, DVT, compartment syndrome.  This appears to be a chronic process for the patient, he has been using compression stockings and elevation with minimal relief.  After review of medical record with patient it does not appear that the patient is using a diuretic at this time even though he was using 1 in the recent past.  Patient is afebrile, not tachycardic, not hypotensive, not hypoxic, well-appearing and in no acute distress.  He is ambulatory with a cane without difficulty or assistance.  BMP with creatinine of 1.25, CBC nonacute.  Chest x-ray without acute findings.  EKG reviewed by Dr. Francia Greaves without acute findings.  Consult was called to Dr. Acie Fredrickson, cardiology, who advises starting the patient on 40 mg Lasix and 20 equivalents potassium daily for the next 3 days and for the patient to follow-up with his cardiologist Dr. [name] on Monday.  At this time there does not appear to be any evidence of an acute emergency medical condition and the patient appears stable for discharge with  appropriate outpatient follow up. Diagnosis was discussed with patient who verbalizes understanding of care plan and is agreeable to discharge. I have discussed return precautions with patient who verbalizes understanding of return precautions. Patient strongly encouraged to follow-up with cardiology on Monday. All questions answered.  Patient was also seen and evaluated by Dr. Francia Greaves during this visit who agrees with cardiology recommendations and discharge at this time.  Note: Portions of this report may have been transcribed using voice recognition software. Every effort was made to ensure accuracy; however, inadvertent computerized transcription errors may still be present. Final Clinical Impressions(s) / ED Diagnoses   Final diagnoses:  Bilateral lower extremity edema    ED Discharge Orders         Ordered    potassium chloride SA (K-DUR,KLOR-CON) 20 MEQ tablet  Daily     07/29/18 1614    furosemide (LASIX) 20 MG tablet  Daily     07/29/18 1614           Gari Crown 07/29/18 1638    Valarie Merino, MD 07/30/18 [phone]

## 2018-07-29 NOTE — ED Triage Notes (Signed)
Pt presents for bilateral leg swelling, denies pain, able to ambulate. Reports hx of CHF, states has been taking his medication as prescribed.

## 2018-07-29 NOTE — Discharge Instructions (Signed)
You have been diagnosed today with bilateral lower extremity edema.  At this time there does not appear to be the presence of an emergent medical condition, however there is always the potential for conditions to change. Please read and follow the below instructions.  Please return to the Emergency Department immediately for any new or worsening symptoms. Please be sure to follow up with your Primary Care Provider next week regarding your visit today; please call their office to schedule an appointment even if you are feeling better for a follow-up visit. Please call your cardiologist Dr. Alvester Chou on Monday to schedule an appointment to see him early next week. Please take the fluid pill Lasix as prescribed, 40 mg daily for 3 days.  In addition please take the potassium, 20 mEq once daily for 3 days. The Lasix will make you urinate more frequently, use this medication in the morning as do not have to night in the middle of the night.   Please continue to use your compression stockings and elevation to help with your extremity swelling.  Get help right away if: You develop shortness of breath, especially when you are lying down. You have pain in your chest or abdomen. You feel weak. You faint. You have a fever. Your edema starts suddenly or is getting worse, especially if you are pregnant or have a medical condition. You have swelling in only one leg. You have increased swelling and pain in your legs.  Please read the additional information packets attached to your discharge summary.  Do not take your medicine if  develop an itchy rash, swelling in your mouth or lips, or difficulty breathing.

## 2018-07-29 NOTE — ED Notes (Signed)
Patient transported to X-ray 

## 2018-07-29 NOTE — ED Triage Notes (Signed)
Pt here for swelling to bilateral legs x months worse over last several days

## 2018-07-29 NOTE — ED Notes (Signed)
Pt using urinal at bedside at this time

## 2018-07-29 NOTE — ED Notes (Signed)
Patient Alert and oriented to baseline. Stable and ambulatory to baseline. Patient verbalized understanding of the discharge instructions.  Patient belongings were taken by the patient.   

## 2018-07-29 NOTE — ED Notes (Signed)
Messick MD at bedside 

## 2018-07-29 NOTE — ED Notes (Signed)
ED PA at bedside

## 2018-08-10 ENCOUNTER — Other Ambulatory Visit: Payer: Self-pay | Admitting: Cardiovascular Disease

## 2018-08-15 ENCOUNTER — Other Ambulatory Visit: Payer: Self-pay | Admitting: Cardiovascular Disease

## 2018-08-21 ENCOUNTER — Ambulatory Visit (HOSPITAL_COMMUNITY)
Admission: RE | Admit: 2018-08-21 | Payer: Medicare Other | Source: Ambulatory Visit | Attending: Cardiovascular Disease | Admitting: Cardiovascular Disease

## 2018-09-12 ENCOUNTER — Ambulatory Visit (HOSPITAL_COMMUNITY)
Admission: RE | Admit: 2018-09-12 | Discharge: 2018-09-12 | Disposition: A | Discharge status: Home / Self Care | Payer: Medicare Other | Source: Ambulatory Visit | Attending: Internal Medicine | Admitting: Internal Medicine

## 2018-09-12 DIAGNOSIS — I998 Other disorder of circulatory system: Secondary | ICD-10-CM | POA: Insufficient documentation

## 2018-09-12 DIAGNOSIS — I70229 Atherosclerosis of native arteries of extremities with rest pain, unspecified extremity: Secondary | ICD-10-CM

## 2018-09-13 ENCOUNTER — Other Ambulatory Visit: Payer: Self-pay | Admitting: *Deleted

## 2018-09-13 DIAGNOSIS — I70229 Atherosclerosis of native arteries of extremities with rest pain, unspecified extremity: Secondary | ICD-10-CM

## 2018-09-13 DIAGNOSIS — I998 Other disorder of circulatory system: Secondary | ICD-10-CM

## 2018-09-15 ENCOUNTER — Other Ambulatory Visit: Payer: Self-pay | Admitting: Cardiovascular Disease

## 2018-09-29 ENCOUNTER — Other Ambulatory Visit: Payer: Self-pay | Admitting: Cardiovascular Disease

## 2018-10-03 ENCOUNTER — Inpatient Hospital Stay (HOSPITAL_COMMUNITY)
Admission: EM | Admit: 2018-10-03 | Discharge: 2018-10-15 | DRG: 388 | Disposition: E | Payer: Medicare Other | Attending: Pulmonary Disease | Admitting: Pulmonary Disease

## 2018-10-03 ENCOUNTER — Inpatient Hospital Stay (HOSPITAL_COMMUNITY): Payer: Medicare Other

## 2018-10-03 ENCOUNTER — Encounter (HOSPITAL_COMMUNITY): Payer: Self-pay | Admitting: *Deleted

## 2018-10-03 ENCOUNTER — Emergency Department (HOSPITAL_COMMUNITY): Payer: Medicare Other

## 2018-10-03 ENCOUNTER — Other Ambulatory Visit: Payer: Self-pay

## 2018-10-03 DIAGNOSIS — I4901 Ventricular fibrillation: Secondary | ICD-10-CM | POA: Diagnosis not present

## 2018-10-03 DIAGNOSIS — K56609 Unspecified intestinal obstruction, unspecified as to partial versus complete obstruction: Principal | ICD-10-CM | POA: Diagnosis present

## 2018-10-03 DIAGNOSIS — Z8673 Personal history of transient ischemic attack (TIA), and cerebral infarction without residual deficits: Secondary | ICD-10-CM

## 2018-10-03 DIAGNOSIS — Z7901 Long term (current) use of anticoagulants: Secondary | ICD-10-CM

## 2018-10-03 DIAGNOSIS — E876 Hypokalemia: Secondary | ICD-10-CM | POA: Diagnosis not present

## 2018-10-03 DIAGNOSIS — E7800 Pure hypercholesterolemia, unspecified: Secondary | ICD-10-CM | POA: Diagnosis present

## 2018-10-03 DIAGNOSIS — I480 Paroxysmal atrial fibrillation: Secondary | ICD-10-CM | POA: Diagnosis present

## 2018-10-03 DIAGNOSIS — E870 Hyperosmolality and hypernatremia: Secondary | ICD-10-CM | POA: Diagnosis not present

## 2018-10-03 DIAGNOSIS — I340 Nonrheumatic mitral (valve) insufficiency: Secondary | ICD-10-CM | POA: Diagnosis not present

## 2018-10-03 DIAGNOSIS — H409 Unspecified glaucoma: Secondary | ICD-10-CM | POA: Diagnosis present

## 2018-10-03 DIAGNOSIS — I469 Cardiac arrest, cause unspecified: Secondary | ICD-10-CM

## 2018-10-03 DIAGNOSIS — Z8711 Personal history of peptic ulcer disease: Secondary | ICD-10-CM | POA: Diagnosis not present

## 2018-10-03 DIAGNOSIS — R34 Anuria and oliguria: Secondary | ICD-10-CM | POA: Diagnosis not present

## 2018-10-03 DIAGNOSIS — N179 Acute kidney failure, unspecified: Secondary | ICD-10-CM | POA: Diagnosis not present

## 2018-10-03 DIAGNOSIS — R6521 Severe sepsis with septic shock: Secondary | ICD-10-CM | POA: Diagnosis not present

## 2018-10-03 DIAGNOSIS — E785 Hyperlipidemia, unspecified: Secondary | ICD-10-CM | POA: Diagnosis present

## 2018-10-03 DIAGNOSIS — I2510 Atherosclerotic heart disease of native coronary artery without angina pectoris: Secondary | ICD-10-CM | POA: Diagnosis present

## 2018-10-03 DIAGNOSIS — Z8546 Personal history of malignant neoplasm of prostate: Secondary | ICD-10-CM | POA: Diagnosis not present

## 2018-10-03 DIAGNOSIS — A419 Sepsis, unspecified organism: Secondary | ICD-10-CM

## 2018-10-03 DIAGNOSIS — Z66 Do not resuscitate: Secondary | ICD-10-CM | POA: Diagnosis not present

## 2018-10-03 DIAGNOSIS — Z882 Allergy status to sulfonamides status: Secondary | ICD-10-CM

## 2018-10-03 DIAGNOSIS — I5032 Chronic diastolic (congestive) heart failure: Secondary | ICD-10-CM | POA: Diagnosis present

## 2018-10-03 DIAGNOSIS — I9789 Other postprocedural complications and disorders of the circulatory system, not elsewhere classified: Secondary | ICD-10-CM

## 2018-10-03 DIAGNOSIS — Z8249 Family history of ischemic heart disease and other diseases of the circulatory system: Secondary | ICD-10-CM

## 2018-10-03 DIAGNOSIS — Z452 Encounter for adjustment and management of vascular access device: Secondary | ICD-10-CM

## 2018-10-03 DIAGNOSIS — J690 Pneumonitis due to inhalation of food and vomit: Secondary | ICD-10-CM | POA: Diagnosis not present

## 2018-10-03 DIAGNOSIS — J9601 Acute respiratory failure with hypoxia: Secondary | ICD-10-CM | POA: Diagnosis not present

## 2018-10-03 DIAGNOSIS — R188 Other ascites: Secondary | ICD-10-CM | POA: Diagnosis present

## 2018-10-03 DIAGNOSIS — R451 Restlessness and agitation: Secondary | ICD-10-CM | POA: Diagnosis not present

## 2018-10-03 DIAGNOSIS — Z8701 Personal history of pneumonia (recurrent): Secondary | ICD-10-CM

## 2018-10-03 DIAGNOSIS — I739 Peripheral vascular disease, unspecified: Secondary | ICD-10-CM | POA: Diagnosis present

## 2018-10-03 DIAGNOSIS — I361 Nonrheumatic tricuspid (valve) insufficiency: Secondary | ICD-10-CM | POA: Diagnosis not present

## 2018-10-03 DIAGNOSIS — Z888 Allergy status to other drugs, medicaments and biological substances status: Secondary | ICD-10-CM

## 2018-10-03 DIAGNOSIS — K219 Gastro-esophageal reflux disease without esophagitis: Secondary | ICD-10-CM | POA: Diagnosis present

## 2018-10-03 DIAGNOSIS — N189 Chronic kidney disease, unspecified: Secondary | ICD-10-CM | POA: Diagnosis present

## 2018-10-03 DIAGNOSIS — R17 Unspecified jaundice: Secondary | ICD-10-CM | POA: Diagnosis present

## 2018-10-03 DIAGNOSIS — M170 Bilateral primary osteoarthritis of knee: Secondary | ICD-10-CM | POA: Diagnosis present

## 2018-10-03 DIAGNOSIS — M109 Gout, unspecified: Secondary | ICD-10-CM | POA: Diagnosis present

## 2018-10-03 DIAGNOSIS — E874 Mixed disorder of acid-base balance: Secondary | ICD-10-CM | POA: Diagnosis not present

## 2018-10-03 DIAGNOSIS — E039 Hypothyroidism, unspecified: Secondary | ICD-10-CM | POA: Diagnosis present

## 2018-10-03 DIAGNOSIS — E46 Unspecified protein-calorie malnutrition: Secondary | ICD-10-CM | POA: Diagnosis present

## 2018-10-03 DIAGNOSIS — J189 Pneumonia, unspecified organism: Secondary | ICD-10-CM

## 2018-10-03 DIAGNOSIS — E162 Hypoglycemia, unspecified: Secondary | ICD-10-CM | POA: Diagnosis not present

## 2018-10-03 DIAGNOSIS — Z6820 Body mass index (BMI) 20.0-20.9, adult: Secondary | ICD-10-CM

## 2018-10-03 DIAGNOSIS — Z79899 Other long term (current) drug therapy: Secondary | ICD-10-CM

## 2018-10-03 DIAGNOSIS — Z79890 Hormone replacement therapy (postmenopausal): Secondary | ICD-10-CM

## 2018-10-03 DIAGNOSIS — Z9862 Peripheral vascular angioplasty status: Secondary | ICD-10-CM

## 2018-10-03 DIAGNOSIS — R410 Disorientation, unspecified: Secondary | ICD-10-CM | POA: Diagnosis not present

## 2018-10-03 DIAGNOSIS — R570 Cardiogenic shock: Secondary | ICD-10-CM | POA: Diagnosis not present

## 2018-10-03 DIAGNOSIS — E860 Dehydration: Secondary | ICD-10-CM | POA: Diagnosis present

## 2018-10-03 DIAGNOSIS — Z7982 Long term (current) use of aspirin: Secondary | ICD-10-CM

## 2018-10-03 DIAGNOSIS — I248 Other forms of acute ischemic heart disease: Secondary | ICD-10-CM | POA: Diagnosis not present

## 2018-10-03 DIAGNOSIS — I130 Hypertensive heart and chronic kidney disease with heart failure and stage 1 through stage 4 chronic kidney disease, or unspecified chronic kidney disease: Secondary | ICD-10-CM | POA: Diagnosis present

## 2018-10-03 DIAGNOSIS — Z0189 Encounter for other specified special examinations: Secondary | ICD-10-CM

## 2018-10-03 DIAGNOSIS — I252 Old myocardial infarction: Secondary | ICD-10-CM

## 2018-10-03 DIAGNOSIS — F329 Major depressive disorder, single episode, unspecified: Secondary | ICD-10-CM | POA: Diagnosis present

## 2018-10-03 DIAGNOSIS — Z7902 Long term (current) use of antithrombotics/antiplatelets: Secondary | ICD-10-CM

## 2018-10-03 DIAGNOSIS — Z4659 Encounter for fitting and adjustment of other gastrointestinal appliance and device: Secondary | ICD-10-CM

## 2018-10-03 DIAGNOSIS — Z8719 Personal history of other diseases of the digestive system: Secondary | ICD-10-CM

## 2018-10-03 LAB — COMPREHENSIVE METABOLIC PANEL
ALT: 17 U/L (ref 0–44)
AST: 34 U/L (ref 15–41)
Albumin: 3.6 g/dL (ref 3.5–5.0)
Alkaline Phosphatase: 102 U/L (ref 38–126)
Anion gap: 10 (ref 5–15)
BUN: 21 mg/dL (ref 8–23)
CO2: 23 mmol/L (ref 22–32)
Calcium: 9 mg/dL (ref 8.9–10.3)
Chloride: 103 mmol/L (ref 98–111)
Creatinine, Ser: 0.92 mg/dL (ref 0.61–1.24)
GFR calc Af Amer: >60 mL/min (ref >60)
GFR calc non Af Amer: >60 mL/min (ref >60)
Glucose, Bld: 103 mg/dL — ABNORMAL HIGH (ref 70–99)
Potassium: 3.6 mmol/L (ref 3.5–5.1)
SODIUM: 136 mmol/L (ref 135–145)
Total Bilirubin: 1.1 mg/dL (ref 0.3–1.2)
Total Protein: 7.5 g/dL (ref 6.5–8.1)

## 2018-10-03 LAB — CBC
HCT: 40.3 % (ref 39.0–52.0)
Hemoglobin: 12.9 g/dL — ABNORMAL LOW (ref 13.0–17.0)
MCH: 28 pg (ref 26.0–34.0)
MCHC: 32 g/dL (ref 30.0–36.0)
MCV: 87.6 fL (ref 80.0–100.0)
Platelets: 200 10*3/uL (ref 150–400)
RBC: 4.6 MIL/uL (ref 4.22–5.81)
RDW: 17.5 % — ABNORMAL HIGH (ref 11.5–15.5)
WBC: 11.1 10*3/uL — ABNORMAL HIGH (ref 4.0–10.5)
nRBC: 0 % (ref 0.0–0.2)

## 2018-10-03 LAB — URINALYSIS, ROUTINE W REFLEX MICROSCOPIC
Bilirubin Urine: NEGATIVE
Glucose, UA: NEGATIVE mg/dL
Hgb urine dipstick: NEGATIVE
Ketones, ur: NEGATIVE mg/dL
Leukocytes,Ua: NEGATIVE
Nitrite: NEGATIVE
Protein, ur: NEGATIVE mg/dL
Specific Gravity, Urine: 1.014 (ref 1.005–1.030)
pH: 7 (ref 5.0–8.0)

## 2018-10-03 LAB — TROPONIN I
TROPONIN I: 0.06 ng/mL — AB (ref <0.03)
Troponin I: 0.07 ng/mL (ref <0.03)
Troponin I: 0.07 ng/mL (ref <0.03)

## 2018-10-03 LAB — LIPASE, BLOOD: Lipase: 32 U/L (ref 11–51)

## 2018-10-03 LAB — LACTIC ACID, PLASMA: Lactic Acid, Venous: 1.2 mmol/L (ref 0.5–1.9)

## 2018-10-03 MED ORDER — FAMOTIDINE IN NACL 20-0.9 MG/50ML-% IV SOLN
20.0000 mg | Freq: Two times a day (BID) | INTRAVENOUS | Status: DC
  Administered 2018-10-03 – 2018-10-05 (×4): 20 mg via INTRAVENOUS
  Filled 2018-10-03 (×4): qty 50

## 2018-10-03 MED ORDER — BRIMONIDINE TARTRATE 0.2 % OP SOLN
1.0000 [drp] | Freq: Every day | OPHTHALMIC | Status: DC
  Administered 2018-10-03 – 2018-10-04 (×2): 1 [drp] via OPHTHALMIC
  Filled 2018-10-03: qty 5

## 2018-10-03 MED ORDER — IOPAMIDOL (ISOVUE-300) INJECTION 61%
100.0000 mL | Freq: Once | INTRAVENOUS | Status: AC | PRN
  Administered 2018-10-03: 100 mL via INTRAVENOUS

## 2018-10-03 MED ORDER — IOPAMIDOL (ISOVUE-300) INJECTION 61%
INTRAVENOUS | Status: AC
  Filled 2018-10-03: qty 100

## 2018-10-03 MED ORDER — MORPHINE SULFATE (PF) 2 MG/ML IV SOLN: 1.0000 mg | INTRAVENOUS | Status: DC | PRN

## 2018-10-03 MED ORDER — MORPHINE SULFATE (PF) 4 MG/ML IV SOLN
4.0000 mg | Freq: Once | INTRAVENOUS | Status: AC
  Administered 2018-10-03: 4 mg via INTRAVENOUS
  Filled 2018-10-03: qty 1

## 2018-10-03 MED ORDER — ONDANSETRON HCL 4 MG/2ML IJ SOLN
4.0000 mg | Freq: Once | INTRAMUSCULAR | Status: AC
  Administered 2018-10-03: 4 mg via INTRAVENOUS
  Filled 2018-10-03: qty 2

## 2018-10-03 MED ORDER — LIDOCAINE HCL URETHRAL/MUCOSAL 2 % EX GEL
CUTANEOUS | Status: AC
  Filled 2018-10-03: qty 5

## 2018-10-03 MED ORDER — ACETAMINOPHEN 650 MG RE SUPP: 650.0000 mg | Freq: Four times a day (QID) | RECTAL | Status: DC | PRN

## 2018-10-03 MED ORDER — METOPROLOL TARTRATE 5 MG/5ML IV SOLN
5.0000 mg | Freq: Four times a day (QID) | INTRAVENOUS | Status: DC
  Administered 2018-10-03 – 2018-10-05 (×6): 5 mg via INTRAVENOUS
  Filled 2018-10-03 (×6): qty 5

## 2018-10-03 MED ORDER — SODIUM CHLORIDE 0.9 % IV SOLN: Freq: Once | INTRAVENOUS | Status: DC

## 2018-10-03 MED ORDER — LACTATED RINGERS IV SOLN
INTRAVENOUS | Status: DC
  Administered 2018-10-03 – 2018-10-04 (×2): via INTRAVENOUS

## 2018-10-03 MED ORDER — COLCHICINE 0.6 MG PO TABS
0.6000 mg | ORAL_TABLET | Freq: Every day | ORAL | Status: DC
  Administered 2018-10-03 – 2018-10-04 (×2): 0.6 mg via ORAL
  Filled 2018-10-03 (×3): qty 1

## 2018-10-03 MED ORDER — ONDANSETRON HCL 4 MG/2ML IJ SOLN: 4.0000 mg | Freq: Four times a day (QID) | INTRAMUSCULAR | Status: DC | PRN

## 2018-10-03 MED ORDER — ACETAMINOPHEN 325 MG PO TABS: 650.0000 mg | ORAL_TABLET | Freq: Four times a day (QID) | ORAL | Status: DC | PRN

## 2018-10-03 MED ORDER — SODIUM CHLORIDE 0.9% FLUSH
3.0000 mL | Freq: Once | INTRAVENOUS | Status: AC
  Administered 2018-10-03: 3 mL via INTRAVENOUS

## 2018-10-03 MED ORDER — ENOXAPARIN SODIUM 40 MG/0.4ML ~~LOC~~ SOLN
40.0000 mg | SUBCUTANEOUS | Status: DC
  Administered 2018-10-03: 40 mg via SUBCUTANEOUS
  Filled 2018-10-03: qty 0.4

## 2018-10-03 MED ORDER — SODIUM CHLORIDE (PF) 0.9 % IJ SOLN
INTRAMUSCULAR | Status: AC
  Filled 2018-10-03: qty 50

## 2018-10-03 MED ORDER — LEVOTHYROXINE SODIUM 100 MCG/5ML IV SOLN
37.5000 ug | Freq: Every day | INTRAVENOUS | Status: DC
  Administered 2018-10-03 – 2018-10-05 (×3): 37.5 ug via INTRAVENOUS
  Filled 2018-10-03 (×3): qty 5

## 2018-10-03 MED ORDER — ONDANSETRON HCL 4 MG PO TABS: 4.0000 mg | ORAL_TABLET | Freq: Four times a day (QID) | ORAL | Status: DC | PRN

## 2018-10-03 MED ORDER — FENTANYL CITRATE (PF) 100 MCG/2ML IJ SOLN
25.0000 ug | Freq: Once | INTRAMUSCULAR | Status: AC
  Administered 2018-10-03: 25 ug via INTRAVENOUS
  Filled 2018-10-03: qty 2

## 2018-10-03 NOTE — ED Notes (Signed)
Bed: QA44 Expected date:  Expected time:  Means of arrival:  Comments: EMS 80s emesis

## 2018-10-03 NOTE — ED Provider Notes (Signed)
[facility] DEPT Provider Note   CSN: [id number] Arrival date & time: 09/16/2018  7026    History   Chief Complaint Chief Complaint  Patient presents with  . Abdominal Pain    HPI Gregory [name] is a 85 y.o. male.     HPI 85 year old male with extensive past medical history as below including CHF, coronary disease, hypertension, hyperlipidemia, history of ulcers, paroxysmal A. fib on Eliquis, here with generalized weakness, nausea, and vomiting.  The patient states that for the last week, he has had a persistent, aching, throbbing, epigastric and diffuse abdominal pain.  He said associated nausea and poor appetite.  He has had minimal p.o. intake and endorses generalized weakness due to this.  He has a history of ulcers as well as pancreatitis with similar symptoms.  Denies any alcohol use.  Denies any melena or hematochezia.  No hematemesis.  He states because of his poor appetite, he has felt generally weak and had some difficulty getting around the house.  No urinary symptoms.  No other complaints.  No chest pain or shortness of breath.  Past Medical History:  Diagnosis Date  . Arthritis    "legs, knees" (08/05/2016)  . CAD (coronary artery disease)    a. cath 03/2016: diffuse disease w/ 50% ostial LM stenosis, 75% ostial Cx, 50% 3rd Mrg, 65% distal RCA, and 50% RPDA. Medical management recommended.  . CHF (congestive heart failure) ([place])   . Critical lower limb ischemia 01/24/2017  . GIB (gastrointestinal bleeding)    a. occurred in 2016, secondary to gastritis and diverticulosis  . History of gout   . History of stomach ulcers   . Hypercholesteremia   . Hypertension   . Hypertensive heart disease   . LVH (left ventricular hypertrophy)    a. 03/2016 Echo: EF 55-65%, no rwma, Gr1 DD, sev LVH, mildly dil RA/LA, small peric eff;  b. 03/2016 Cardiac MRI: sev LVH, mildly dil RV, possible RV thrombus, diffuse LGE involving RV and diff subendocardial LGE  in LV. *No M spike on SPEP, nl immunofixation pattern - no clear evidence of cardiac amyloidosis.  . Murmur, cardiac   . PAF (paroxysmal atrial fibrillation) ([place])    a. new-onset 03/2016, placed on Eliquis (CHA2DS2VASc = 6).  . Pericardial effusion    a. diagnosed in 07/2016. Underwent pericardiocentesis at that time.   . Pneumonia    "quite a few times" (08/05/2016)  . Poor appetite   . Prostate cancer ([place])   . STEMI (ST elevation myocardial infarction) ([place]) 03/2016   Archie Endo 03/26/2016  . Stroke [facility])    a. nonhemorrhagic CVA in 2015    Patient Active Problem List   Diagnosis Date Noted  . Critical lower limb ischemia 01/11/2017  . DNR (do not resuscitate)   . DNR (do not resuscitate) discussion 09/19/2016  . Palliative care by specialist 09/19/2016  . Bacteremia due to Gram-negative bacteria 09/19/2016  . Cholelithiases 09/19/2016  . Gallstone pancreatitis: Probable 09/19/2016  . Biliary colic: Probable 37/85/8850  . Abnormal LFTs   . Pressure injury of skin 09/18/2016  . Protein-calorie malnutrition, severe 09/18/2016  . Abdominal pain 09/17/2016  . Adult failure to thrive 08/24/2016  . Persistent atrial fibrillation   . Anticoagulated   . Difficulty in walking, not elsewhere classified   . Melanotic stools 08/14/2016  . Postoperative anemia due to acute blood loss 08/14/2016  . Pericardial effusion   . Hypokalemia   . Malnutrition of moderate degree 08/06/2016  .  Hypotension   . Cardiac tamponade   . Chronic diastolic congestive heart failure ([place]) 08/05/2016  . Incarcerated inguinal hernia 08/05/2016  . Incarcerated left inguinal hernia 08/05/2016  . Hypercholesteremia   . Hypertensive heart disease   . Coronary artery disease   . LVH (left ventricular hypertrophy)   . PAF (paroxysmal atrial fibrillation) ([place])   . NSTEMI (non-ST elevated myocardial infarction) ([place]) 03/26/2016  . Acute coronary syndrome ([place])   . GI bleed 10/03/2014  . H/O: CVA  (cerebrovascular accident)   . CVA (cerebral infarction) 01/30/2014  . Right arm weakness 01/30/2014  . Hypertension 12/05/2010  . Hyperlipidemia 12/05/2010    Past Surgical History:  Procedure Laterality Date  . ABDOMINAL AORTOGRAM W/LOWER EXTREMITY N/A 01/24/2017   Procedure: Abdominal Aortogram w/Lower Extremity;  Surgeon: Lorretta Harp, MD;  Location: [facility];  Service: Cardiovascular;  Laterality: N/A;  . CARDIAC CATHETERIZATION N/A 03/26/2016   Procedure: Left Heart Cath and Coronary Angiography;  Surgeon: Troy Sine, MD;  Location: [facility];  Service: Cardiovascular;  Laterality: N/A;  . CARDIAC CATHETERIZATION N/A 08/06/2016   Procedure: Pericardiocentesis;  Surgeon: Sherren Mocha, MD;  Location: [facility];  Service: Cardiovascular;  Laterality: N/A;  . COLONOSCOPY WITH PROPOFOL N/A 10/04/2014   Procedure: COLONOSCOPY WITH PROPOFOL;  Surgeon: Laurence Spates, MD;  Location: [facility];  Service: Endoscopy;  Laterality: N/A;  . DIAGNOSTIC LAPAROSCOPY  08/05/2016   reduction of strangulated small bowel from left inguinal hernia, TAPP repair with phasix mesh, small bowel resection/notes 08/05/2016  . ESOPHAGOGASTRODUODENOSCOPY N/A 10/03/2014   Procedure: ESOPHAGOGASTRODUODENOSCOPY (EGD);  Surgeon: Winfield Cunas., MD;  Location: [facility] ENDOSCOPY;  Service: Endoscopy;  Laterality: N/A;  . HERNIA REPAIR    . LAPAROSCOPY N/A 08/05/2016   Procedure: LAPAROSCOPY DIAGNOSTIC AND LAPAROSCOPIC INGUINAL HERNIA REPAIR.;  Surgeon: Clovis Riley, MD;  Location: [facility];  Service: General;  Laterality: N/A;  . LAPAROTOMY N/A 08/05/2016   Procedure: Anne Fu WITH  SMALL BOWEL RESECTION.;  Surgeon: Clovis Riley, MD;  Location: [facility];  Service: General;  Laterality: N/A;  . PERIPHERAL VASCULAR ATHERECTOMY  01/24/2017   Procedure: Peripheral Vascular Atherectomy;  Surgeon: Lorretta Harp, MD;  Location: [facility];  Service:  Cardiovascular;;  Lt SFA  . PROSTATECTOMY  1990s?        Home Medications    Prior to Admission medications   Medication Sig Start Date End Date Taking? Authorizing Provider  amiodarone (PACERONE) 100 MG tablet Take 1 tablet (100 mg total) by mouth daily. 06/03/17   Troy Sine, MD  amiodarone (PACERONE) 200 MG tablet TAKE (1/2) TABLET DAILY. 02/23/18   Troy Sine, MD  amLODipine (NORVASC) 5 MG tablet TAKE 1 TABLET EACH DAY. Patient taking differently: Take 5 mg by mouth daily.  03/01/18   Lorretta Harp, MD  aspirin EC 81 MG EC tablet Take 1 tablet (81 mg total) by mouth daily. 01/26/17   Delos Haring, PA-C  atorvastatin (LIPITOR) 40 MG tablet TAKE 1 TABLET BY MOUTH DAILY. Patient taking differently: Take 40 mg by mouth daily at 6 PM.  01/30/18   Troy Sine, MD  brimonidine (ALPHAGAN) 0.2 % ophthalmic solution Place 1 drop into both eyes at bedtime. 09/23/16   Ghimire, Henreitta Leber, MD  clopidogrel (PLAVIX) 75 MG tablet TAKE 1 TABLET DAILY WITH BREAKFAST. Patient taking differently: Take 75 mg by mouth daily.  11/29/17   Troy Sine, MD  colchicine 0.6 MG  tablet Take 1 tablet (0.6 mg total) by mouth daily. 08/22/16   Rai, Vernelle Emerald, MD  cyproheptadine (PERIACTIN) 4 MG tablet TAKE ONE TABLET AT BEDTIME. 11/16/17   Troy Sine, MD  ELIQUIS 5 MG TABS tablet TAKE 1 TABLET BY MOUTH TWICE DAILY. 11/29/17   Troy Sine, MD  furosemide (LASIX) 20 MG tablet Take 2 tablets (40 mg total) by mouth daily for 3 days. 07/29/18 08/01/18  Nuala Alpha A, PA-C  levothyroxine (SYNTHROID, LEVOTHROID) 75 MCG tablet TAKE 1 TABLET ONCE DAILY. 11/16/17   Troy Sine, MD  metoprolol (LOPRESSOR) 100 MG tablet Take 1 tablet (100 mg total) by mouth 2 (two) times daily. Patient taking differently: Take 100 mg by mouth daily.  09/23/16   Ghimire, Henreitta Leber, MD  mirtazapine (REMERON SOL-TAB) 15 MG disintegrating tablet Take 0.5 tablets (7.5 mg total) by mouth at bedtime. 09/23/16   Jonetta Osgood, MD  mirtazapine (REMERON) 15 MG tablet TAKE 1/2 TABLET AT BEDTIME. 04/03/18   Lorretta Harp, MD  Multiple Vitamin (MULTIVITAMIN WITH MINERALS) TABS tablet Take 1 tablet by mouth daily. 08/22/16   Rai, Ripudeep K, MD  ondansetron (ZOFRAN-ODT) 4 MG disintegrating tablet Take 1 tablet (4 mg total) by mouth every 6 (six) hours as needed for nausea. 08/22/16   Rai, Ripudeep K, MD  pantoprazole (PROTONIX) 40 MG tablet Take 1 tablet (40 mg total) by mouth daily. 02/02/18   Lorretta Harp, MD  potassium chloride (K-DUR) 10 MEQ tablet TAKE 1 TABLET EACH DAY. 09/15/18   Lorretta Harp, MD  potassium chloride SA (K-DUR,KLOR-CON) 20 MEQ tablet Take 1 tablet (20 mEq total) by mouth daily for 3 days. 07/29/18 08/01/18  Nuala Alpha A, PA-C  sertraline (ZOLOFT) 50 MG tablet Take 1 tablet (50 mg total) by mouth daily. 09/23/16   Ghimire, Henreitta Leber, MD  torsemide (DEMADEX) 20 MG tablet TAKE 1 TABLET BY MOUTH TWICE DAILY. 09/29/18   Troy Sine, MD    Family History Family History  Problem Relation Age of Onset  . Hypertension Mother     Social History Social History   Tobacco Use  . Smoking status: Never Smoker  . Smokeless tobacco: Never Used  Substance Use Topics  . Alcohol use: No    Comment: 08/05/2016 "quit drinking in the 1970s"  . Drug use: No     Allergies   Sulfa antibiotics and Ramipril   Review of Systems Review of Systems  Constitutional: Positive for fatigue. Negative for chills and fever.  HENT: Negative for congestion and rhinorrhea.   Eyes: Negative for visual disturbance.  Respiratory: Negative for cough, shortness of breath and wheezing.   Cardiovascular: Negative for chest pain and leg swelling.  Gastrointestinal: Positive for abdominal pain, nausea and vomiting. Negative for diarrhea.  Genitourinary: Negative for dysuria and flank pain.  Musculoskeletal: Negative for neck pain and neck stiffness.  Skin: Negative for rash and wound.  Allergic/Immunologic:  Negative for immunocompromised state.  Neurological: Positive for weakness. Negative for syncope and headaches.  All other systems reviewed and are negative.    Physical Exam Updated Vital Signs BP 131/84   Pulse 85   Temp 97.9 F (36.6 C) (Oral)   Resp 15   SpO2 99%   Physical Exam Vitals signs and nursing note reviewed.  Constitutional:      General: He is not in acute distress.    Appearance: He is well-developed.  HENT:     Head: Normocephalic and atraumatic.  Eyes:     Conjunctiva/sclera: Conjunctivae normal.  Neck:     Musculoskeletal: Neck supple.  Cardiovascular:     Rate and Rhythm: Normal rate and regular rhythm.     Heart sounds: Normal heart sounds. No murmur. No friction rub.  Pulmonary:     Effort: Pulmonary effort is normal. No respiratory distress.     Breath sounds: Normal breath sounds. No wheezing or rales.  Abdominal:     General: There is no distension.     Palpations: Abdomen is soft.     Tenderness: There is generalized abdominal tenderness. There is no guarding or rebound.     Comments: Well-healed midline lower abdominal surgical scar  Musculoskeletal:     Right lower leg: Edema (2+ pitting edema) present.     Left lower leg: Edema (1+) present.  Skin:    General: Skin is warm.     Capillary Refill: Capillary refill takes less than 2 seconds.  Neurological:     Mental Status: He is alert and oriented to person, place, and time.     Motor: No abnormal muscle tone.      ED Treatments / Results  Labs (all labs ordered are listed, but only abnormal results are displayed) Labs Reviewed  COMPREHENSIVE METABOLIC PANEL - Abnormal; Notable for the following components:      Result Value   Glucose, Bld 103 (*)    All other components within normal limits  CBC - Abnormal; Notable for the following components:   WBC 11.1 (*)    Hemoglobin 12.9 (*)    RDW 17.5 (*)    All other components within normal limits  TROPONIN I - Abnormal; Notable  for the following components:   Troponin I 0.07 (*)    All other components within normal limits  LIPASE, BLOOD  URINALYSIS, ROUTINE W REFLEX MICROSCOPIC  LACTIC ACID, PLASMA    EKG None  Radiology Ct Abdomen Pelvis W Contrast  Result Date: 09/23/2018 CLINICAL DATA:  Nausea, vomiting, lower abdominal pain EXAM: CT ABDOMEN AND PELVIS WITH CONTRAST TECHNIQUE: Multidetector CT imaging of the abdomen and pelvis was performed using the standard protocol following bolus administration of intravenous contrast. CONTRAST:  181mL ISOVUE-300 IOPAMIDOL (ISOVUE-300) INJECTION 61% COMPARISON:  08/05/2016 FINDINGS: Lower chest: Coronary artery calcifications. Hepatobiliary: No focal liver abnormality is seen. Gallstones. No gallbladder wall thickening, or biliary dilatation. Pancreas: Unremarkable. No pancreatic ductal dilatation or surrounding inflammatory changes. Spleen: Normal in size without focal abnormality. Adrenals/Urinary Tract: Adrenal glands are unremarkable. Kidneys are normal, without renal calculi, focal lesion, or hydronephrosis. Bladder is unremarkable. Stomach/Bowel: Stomach is within normal limits. The small bowel is diffusely distended with air and fluid levels. There is an anastomosis of the distal small bowel, with a focal, tight transition point in the mid abdomen just proximal to the anastomosis (series 2, image 45). Vascular/Lymphatic: No significant vascular findings are present. No enlarged abdominal or pelvic lymph nodes. Reproductive: Postoperative findings of prostatectomy with numerous surgical clips about the pelvis. Other: No abdominal wall hernia or abnormality. Small volume ascites. Musculoskeletal: No acute or significant osseous findings. IMPRESSION: 1. The small bowel is diffusely distended with air and fluid levels. There is an anastomosis of the distal small bowel, new compared to prior examination dated 08/05/2016, with a focal, tight transition point in the mid abdomen just  proximal to the anastomosis (series 2, image 45). There is scattered gas and stool in the generally decompressed colon. Findings are concerning for small bowel obstruction. 2. Small volume  ascites, non-specific although likely reactive in the setting of bowel obstruction. 3. Chronic, incidental, and postoperative findings as detailed above. Electronically Signed   By: Eddie Candle M.D.   On: 10/08/2018 10:44    Procedures Procedures (including critical care time)  Medications Ordered in ED Medications  iopamidol (ISOVUE-300) 61 % injection (has no administration in time range)  sodium chloride (PF) 0.9 % injection (has no administration in time range)  sodium chloride flush (NS) 0.9 % injection 3 mL (3 mLs Intravenous Given 09/30/2018 0804)  fentaNYL (SUBLIMAZE) injection 25 mcg (25 mcg Intravenous Given 09/17/2018 0934)  ondansetron (ZOFRAN) injection 4 mg (4 mg Intravenous Given 09/20/2018 0933)  iopamidol (ISOVUE-300) 61 % injection 100 mL (100 mLs Intravenous Contrast Given 09/20/2018 1008)     Initial Impression / Assessment and Plan / ED Course  I have reviewed the triage vital signs and the nursing notes.  Pertinent labs & imaging results that were available during my care of the patient were reviewed by me and considered in my medical decision making (see chart for details).        85 yo M with extensive PMHx as above here w/ abd pain, intermittent diarrhea, vomiting. Labs, imaging is c/w SBO. Suspect this is 2/2 adhesions from his prior surgeries. No signs of perforation or acute abdomen. LA normal. Mild leukocytosis is likely reactive. EKG non-ischemic, trop minimally elevated but this is not far from baseline, likely demand-related. Surgery consulted, will place NGT and admit to medicine.  Final Clinical Impressions(s) / ED Diagnoses   Final diagnoses:  SBO (small bowel obstruction) [facility])    ED Discharge Orders    None       Duffy Bruce, MD 09/22/2018 1149

## 2018-10-03 NOTE — ED Notes (Signed)
Date and time results received: 10/06/2018 10:01 AM  (use smartphrase ".now" to insert current time)  Test: Troponin Critical Value: 0.07  Name of Provider Notified: Isaacs  Orders Received? Or Actions Taken?: awaiting orders

## 2018-10-03 NOTE — H&P (Signed)
History and Physical    Gregory [name]:[id number] DOB: May 03, 1930 DOA: 10/07/2018  PCP: Janie Morning, DO  Patient coming from: home  I have personally briefly reviewed patient's old medical records in [place]  Chief Complaint: abodminal pain  HPI: Gregory [name] is a 85 y.o. male with medical history significant of coronary disease, peripheral vascular disease, stroke, hypertension, A. fib, prostate cancer many other medical problems presenting with abdominal pain and CT findings concerning for small bowel obstruction.   Patient notes that he has been having symptoms for about 3 weeks.  He describes this pressure all the way around to his belly.  History is difficult as the patient speaks softly is difficult to understand at times.  Additional history was obtained from the nephew, who noted that the patient called a few days ago and complained of abdominal pain in his belly.  He was going to see if he got better, but this morning he was throwing up.  He called the nephew again who called EMS.  Notes his last bowel movement was 2 days ago (though he endorsed diarrhea to the EDP).  He is not really passing gas.  He endorses vomiting and nausea.    ED Course: Labs, EKG, imaging. Surgery c/s.  Review of Systems: As per HPI otherwise 10 point review of systems negative.   Past Medical History:  Diagnosis Date  . Arthritis    "legs, knees" (08/05/2016)  . CAD (coronary artery disease)    a. cath 03/2016: diffuse disease w/ 50% ostial LM stenosis, 75% ostial Cx, 50% 3rd Mrg, 65% distal RCA, and 50% RPDA. Medical management recommended.  . CHF (congestive heart failure) ([place])   . Critical lower limb ischemia 01/24/2017  . GIB (gastrointestinal bleeding)    a. occurred in 2016, secondary to gastritis and diverticulosis  . History of gout   . History of stomach ulcers   . Hypercholesteremia   . Hypertension   . Hypertensive heart disease   . LVH (left ventricular hypertrophy)      a. 03/2016 Echo: EF 55-65%, no rwma, Gr1 DD, sev LVH, mildly dil RA/LA, small peric eff;  b. 03/2016 Cardiac MRI: sev LVH, mildly dil RV, possible RV thrombus, diffuse LGE involving RV and diff subendocardial LGE in LV. *No M spike on SPEP, nl immunofixation pattern - no clear evidence of cardiac amyloidosis.  . Murmur, cardiac   . PAF (paroxysmal atrial fibrillation) ([place])    a. new-onset 03/2016, placed on Eliquis (CHA2DS2VASc = 6).  . Pericardial effusion    a. diagnosed in 07/2016. Underwent pericardiocentesis at that time.   . Pneumonia    "quite a few times" (08/05/2016)  . Poor appetite   . Prostate cancer ([place])   . STEMI (ST elevation myocardial infarction) ([place]) 03/2016   Archie Endo 03/26/2016  . Stroke [facility])    a. nonhemorrhagic CVA in 2015    Past Surgical History:  Procedure Laterality Date  . ABDOMINAL AORTOGRAM W/LOWER EXTREMITY N/A 01/24/2017   Procedure: Abdominal Aortogram w/Lower Extremity;  Surgeon: Lorretta Harp, MD;  Location: [place] [facility];  Service: Cardiovascular;  Laterality: N/A;  . CARDIAC CATHETERIZATION N/A 03/26/2016   Procedure: Left Heart Cath and Coronary Angiography;  Surgeon: Troy Sine, MD;  Location: [place] [facility];  Service: Cardiovascular;  Laterality: N/A;  . CARDIAC CATHETERIZATION N/A 08/06/2016   Procedure: Pericardiocentesis;  Surgeon: Sherren Mocha, MD;  Location: [place] [facility];  Service: Cardiovascular;  Laterality: N/A;  .  COLONOSCOPY WITH PROPOFOL N/A 10/04/2014   Procedure: COLONOSCOPY WITH PROPOFOL;  Surgeon: Laurence Spates, MD;  Location: [place];  Service: Endoscopy;  Laterality: N/A;  . DIAGNOSTIC LAPAROSCOPY  08/05/2016   reduction of strangulated small bowel from left inguinal hernia, TAPP repair with phasix mesh, small bowel resection/notes 08/05/2016  . ESOPHAGOGASTRODUODENOSCOPY N/A 02/09/202016   Procedure: ESOPHAGOGASTRODUODENOSCOPY (EGD);  Surgeon: Winfield Cunas., MD;  Location: [facility];  Service:  Endoscopy;  Laterality: N/A;  . HERNIA REPAIR    . LAPAROSCOPY N/A 08/05/2016   Procedure: LAPAROSCOPY DIAGNOSTIC AND LAPAROSCOPIC INGUINAL HERNIA REPAIR.;  Surgeon: Clovis Riley, MD;  Location: [place];  Service: General;  Laterality: N/A;  . LAPAROTOMY N/A 08/05/2016   Procedure: Anne Fu WITH  SMALL BOWEL RESECTION.;  Surgeon: Clovis Riley, MD;  Location: [place];  Service: General;  Laterality: N/A;  . PERIPHERAL VASCULAR ATHERECTOMY  01/24/2017   Procedure: Peripheral Vascular Atherectomy;  Surgeon: Lorretta Harp, MD;  Location: [place] [facility];  Service: Cardiovascular;;  Lt SFA  . PROSTATECTOMY  1990s?     reports that he has never smoked. He has never used smokeless tobacco. He reports that he does not drink alcohol or use drugs.  Allergies  Allergen Reactions  . Sulfa Antibiotics Rash  . Ramipril     unknown    Family History  Problem Relation Age of Onset  . Hypertension Mother    Prior to Admission medications   Medication Sig Start Date End Date Taking? Authorizing Provider  Multiple Vitamins-Minerals (CENTRUM SILVER PO) Take by mouth.   Yes [provider]  potassium chloride (K-DUR,KLOR-CON) 10 MEQ tablet Take 10 mEq by mouth 2 (two) times daily.   Yes [provider]  amiodarone (PACERONE) 100 MG tablet Take 1 tablet (100 mg total) by mouth daily. Patient not taking: Reported on 10/10/2018 06/03/17   Troy Sine, MD  amiodarone (PACERONE) 200 MG tablet TAKE (1/2) TABLET DAILY. Patient taking differently: Take 100 mg by mouth daily.  02/23/18   Troy Sine, MD  amLODipine (NORVASC) 5 MG tablet TAKE 1 TABLET EACH DAY. Patient taking differently: Take 5 mg by mouth daily.  03/01/18   Lorretta Harp, MD  aspirin EC 81 MG EC tablet Take 1 tablet (81 mg total) by mouth daily. Patient not taking: Reported on 10/03/2018 01/26/17   Delos Haring, PA-C  atorvastatin (LIPITOR) 40 MG tablet TAKE 1 TABLET BY MOUTH DAILY. Patient  taking differently: Take 40 mg by mouth daily at 6 PM.  01/30/18   Troy Sine, MD  brimonidine (ALPHAGAN) 0.2 % ophthalmic solution Place 1 drop into both eyes at bedtime. 09/23/16   Ghimire, Henreitta Leber, MD  clopidogrel (PLAVIX) 75 MG tablet TAKE 1 TABLET DAILY WITH BREAKFAST. Patient taking differently: Take 75 mg by mouth daily.  11/29/17   Troy Sine, MD  colchicine 0.6 MG tablet Take 1 tablet (0.6 mg total) by mouth daily. 08/22/16   Rai, Vernelle Emerald, MD  cyproheptadine (PERIACTIN) 4 MG tablet TAKE ONE TABLET AT BEDTIME. 11/16/17   Troy Sine, MD  ELIQUIS 5 MG TABS tablet TAKE 1 TABLET BY MOUTH TWICE DAILY. Patient taking differently: Take 5 mg by mouth daily.  11/29/17   Troy Sine, MD  furosemide (LASIX) 20 MG tablet Take 2 tablets (40 mg total) by mouth daily for 3 days. 07/29/18 08/01/18  Nuala Alpha A, PA-C  levothyroxine (SYNTHROID, LEVOTHROID) 75 MCG tablet TAKE 1 TABLET ONCE DAILY. 11/16/17  Troy Sine, MD  metoprolol (LOPRESSOR) 100 MG tablet Take 1 tablet (100 mg total) by mouth 2 (two) times daily. Patient taking differently: Take 100 mg by mouth daily.  09/23/16   Ghimire, Henreitta Leber, MD  mirtazapine (REMERON SOL-TAB) 15 MG disintegrating tablet Take 0.5 tablets (7.5 mg total) by mouth at bedtime. Patient not taking: Reported on 09/30/2018 09/23/16   Jonetta Osgood, MD  mirtazapine (REMERON) 15 MG tablet TAKE 1/2 TABLET AT BEDTIME. Patient taking differently: Take 7.5 mg by mouth at bedtime.  04/03/18   Lorretta Harp, MD  Multiple Vitamin (MULTIVITAMIN WITH MINERALS) TABS tablet Take 1 tablet by mouth daily. 08/22/16   Rai, Ripudeep K, MD  ondansetron (ZOFRAN-ODT) 4 MG disintegrating tablet Take 1 tablet (4 mg total) by mouth every 6 (six) hours as needed for nausea. 08/22/16   Rai, Ripudeep K, MD  pantoprazole (PROTONIX) 40 MG tablet Take 1 tablet (40 mg total) by mouth daily. 02/02/18   Lorretta Harp, MD  potassium chloride (K-DUR) 10 MEQ tablet TAKE 1 TABLET  EACH DAY. 09/15/18   Lorretta Harp, MD  potassium chloride SA (K-DUR,KLOR-CON) 20 MEQ tablet Take 1 tablet (20 mEq total) by mouth daily for 3 days. Patient not taking: Reported on 10/10/2018 07/29/18 08/01/18  Nuala Alpha A, PA-C  sertraline (ZOLOFT) 50 MG tablet Take 1 tablet (50 mg total) by mouth daily. 09/23/16   Ghimire, Henreitta Leber, MD  torsemide (DEMADEX) 20 MG tablet TAKE 1 TABLET BY MOUTH TWICE DAILY. 09/29/18   Troy Sine, MD    Physical Exam: Vitals:   09/22/2018 0750 10/02/2018 1000 10/11/2018 1525 09/30/2018 1532  BP: (!) 157/86 131/84  (!) 151/92  Pulse: 86 85  86  Resp: 18 15  16   Temp: 97.9 F (36.6 C)   98.2 F (36.8 C)  TempSrc: Oral   Oral  SpO2: 100% 99%  95%  Weight:   65.2 kg   Height:   5\' 11"  (1.803 m)     Constitutional: NAD, calm, comfortable Vitals:   09/26/2018 0750 09/27/2018 1000 10/08/2018 1525 09/21/2018 1532  BP: (!) 157/86 131/84  (!) 151/92  Pulse: 86 85  86  Resp: 18 15  16   Temp: 97.9 F (36.6 C)   98.2 F (36.8 C)  TempSrc: Oral   Oral  SpO2: 100% 99%  95%  Weight:   65.2 kg   Height:   5\' 11"  (1.803 m)    Eyes: PERRL, lids and conjunctivae normal ENMT: Mucous membranes are moist. Posterior pharynx clear of any exudate or lesions.Normal dentition.  Neck: normal, supple, no masses, no thyromegaly Respiratory: clear to auscultation bilaterally, no wheezing, no crackles Cardiovascular: Regular rate and rhythm, no murmurs / rubs / gallops. Chronic right greater than left lower extremity edema. Abdomen: NG in place.  no tenderness, no masses palpated. No hepatosplenomegaly. Bowel sounds positive.  Musculoskeletal: no clubbing / cyanosis. No joint deformity upper and lower extremities. Good ROM, no contractures. Normal muscle tone.  Skin: no rashes, lesions, ulcers. No induration Neurologic: CN 2-12 grossly intact. Sensation intact.Marland Kitchen  Psychiatric: Normal judgment and insight. Alert and oriented x 3. Normal mood.   Labs on Admission: I have  personally reviewed following labs and imaging studies  CBC: Recent Labs  Lab 09/27/2018 0805  WBC 11.1*  HGB 12.9*  HCT 40.3  MCV 87.6  PLT 892   Basic Metabolic Panel: Recent Labs  Lab 10/10/2018 0805  NA 136  K 3.6  CL 103  CO2 23  GLUCOSE 103*  BUN 21  CREATININE 0.92  CALCIUM 9.0   GFR: Estimated Creatinine Clearance: 53.2 mL/min (by C-G formula based on SCr of 0.92 mg/dL). Liver Function Tests: Recent Labs  Lab 10/09/2018 0805  AST 34  ALT 17  ALKPHOS 102  BILITOT 1.1  PROT 7.5  ALBUMIN 3.6   Recent Labs  Lab 10/12/2018 0805  LIPASE 32   No results for input(s): AMMONIA in the last 168 hours. Coagulation Profile: No results for input(s): INR, PROTIME in the last 168 hours. Cardiac Enzymes: Recent Labs  Lab 09/23/2018 0805 10/01/2018 1414  TROPONINI 0.07* 0.06*   BNP (last 3 results) No results for input(s): PROBNP in the last 8760 hours. HbA1C: No results for input(s): HGBA1C in the last 72 hours. CBG: No results for input(s): GLUCAP in the last 168 hours. Lipid Profile: No results for input(s): CHOL, HDL, LDLCALC, TRIG, CHOLHDL, LDLDIRECT in the last 72 hours. Thyroid Function Tests: No results for input(s): TSH, T4TOTAL, FREET4, T3FREE, THYROIDAB in the last 72 hours. Anemia Panel: No results for input(s): VITAMINB12, FOLATE, FERRITIN, TIBC, IRON, RETICCTPCT in the last 72 hours. Urine analysis:    Component Value Date/Time   COLORURINE YELLOW 09/17/2018 0805   APPEARANCEUR CLEAR 09/29/2018 0805   LABSPEC 1.014 09/25/2018 0805   PHURINE 7.0 09/27/2018 0805   GLUCOSEU NEGATIVE 10/09/2018 0805   HGBUR NEGATIVE 10/01/2018 0805   BILIRUBINUR NEGATIVE 09/29/2018 0805   KETONESUR NEGATIVE 10/13/2018 0805   PROTEINUR NEGATIVE 10/02/2018 0805   UROBILINOGEN 4.0 (H) 01/30/2014 1402   NITRITE NEGATIVE 09/23/2018 0805   LEUKOCYTESUR NEGATIVE 10/06/2018 0805    Radiological Exams on Admission: Dg Abdomen 1 View  Result Date: 09/28/2018 CLINICAL  DATA:  Nasogastric tube placement and small-bowel obstruction. EXAM: ABDOMEN - 1 VIEW COMPARISON:  Film earlier at 1223 hours FINDINGS: The nasogastric tube has now been straightened with the tip just barely below the diaphragm and likely at the GE junction. No significant change in small bowel dilatation primarily involving proximal small bowel loops. IMPRESSION: Nasogastric tube straightened with tip likely at the GE junction. The tube should be advanced at least 6 cm in order to position the side hole in the proximal stomach. Electronically Signed   By: Aletta Edouard M.D.   On: 10/13/2018 14:05   Dg Abdomen 1 View  Result Date: 10/04/2018 CLINICAL DATA:  Is gastric tube placement EXAM: ABDOMEN - 1 VIEW COMPARISON:  Portable exam 1223 hours compared to CT abdomen and pelvis of 09/26/2018 FINDINGS: Tip of nasogastric tube is coiled in the distal esophagus and reflected cranially. Dilated loops of small bowel with paucity of colonic gas/stool question small bowel obstruction. No bowel wall thickening. Bones demineralized with multilevel degenerative disc disease changes of the thoracolumbar spine. Excreted contrast material within bladder. IMPRESSION: Question small bowel obstruction. Nasogastric tube coiled in the distal esophagus with tip directed cranially; recommend withdrawal/replacement or repositioning. Findings called to [facility] in ED on 10/02/2018 at 1306 hours. Electronically Signed   By: Lavonia Dana M.D.   On: 09/27/2018 13:07   Ct Abdomen Pelvis W Contrast  Result Date: 10/10/2018 CLINICAL DATA:  Nausea, vomiting, lower abdominal pain EXAM: CT ABDOMEN AND PELVIS WITH CONTRAST TECHNIQUE: Multidetector CT imaging of the abdomen and pelvis was performed using the standard protocol following bolus administration of intravenous contrast. CONTRAST:  185mL ISOVUE-300 IOPAMIDOL (ISOVUE-300) INJECTION 61% COMPARISON:  08/05/2016 FINDINGS: Lower chest: Coronary artery calcifications. Hepatobiliary: No  focal liver abnormality is seen. Gallstones. No  gallbladder wall thickening, or biliary dilatation. Pancreas: Unremarkable. No pancreatic ductal dilatation or surrounding inflammatory changes. Spleen: Normal in size without focal abnormality. Adrenals/Urinary Tract: Adrenal glands are unremarkable. Kidneys are normal, without renal calculi, focal lesion, or hydronephrosis. Bladder is unremarkable. Stomach/Bowel: Stomach is within normal limits. The small bowel is diffusely distended with air and fluid levels. There is an anastomosis of the distal small bowel, with a focal, tight transition point in the mid abdomen just proximal to the anastomosis (series 2, image 45). Vascular/Lymphatic: No significant vascular findings are present. No enlarged abdominal or pelvic lymph nodes. Reproductive: Postoperative findings of prostatectomy with numerous surgical clips about the pelvis. Other: No abdominal wall hernia or abnormality. Small volume ascites. Musculoskeletal: No acute or significant osseous findings. IMPRESSION: 1. The small bowel is diffusely distended with air and fluid levels. There is an anastomosis of the distal small bowel, new compared to prior examination dated 08/05/2016, with a focal, tight transition point in the mid abdomen just proximal to the anastomosis (series 2, image 45). There is scattered gas and stool in the generally decompressed colon. Findings are concerning for small bowel obstruction. 2. Small volume ascites, non-specific although likely reactive in the setting of bowel obstruction. 3. Chronic, incidental, and postoperative findings as detailed above. Electronically Signed   By: Eddie Candle M.D.   On: 10/09/2018 10:44    EKG: Independently reviewed. Sinus, RBBB, appears similar to prior  Assessment/Plan Active Problems:   SBO (small bowel obstruction) (HCC)  Small Bowel Obstruction:  CT imaging findings concerning for SBO.   NG, bowel rest, IVF Holding PO meds for now Plan  for small bowel protocol today or in AM per surgery Surgery c/s, appreciate recs  Atrial Fibrillation: chadsvasc at least ~7.  Holding eliquis for now.  Pt did have some difficulty with NG placement and some bleeding noted.  Will hold off on heparin for now, but could consider tomorrow. Amiodarone currently on hold Eliquis on hold Holding PO metoprolol, substituted IV  Coronary Artery Disease  Peripheral Vascular Disease:  S/p directional atherectomy and drug eluting balloon angioplasty of his mid left SFA 01/24/17.  ACS in 2017, cath'd in 03/2016, recommended medical management. Currently holding asa/plavix He doesn't know his medications.  Per Dr. [name] note from 05/2017, he was supposed to eventually d/c plavix and maintain on eliquis and aspirin.  I'm not sure he's done this.  Elevated Troponin: suspect demand.  No concerning EKG changes.  HFpEF; again, pt unsure of meds, awaiting med rec.  Appears euvolemic to dry.   Follow volume status with gentle IVF  Hx Gout: continue colcichine, please confirm on med rec  Hx Depression: holding zoloft for now, holding remeron  Hypothyroidism: continue synthroid IV  Glaucoma: continue eye drops  GERD: H2 blocker  Pt did not know his medications.  Unable to tell us these with confidence.  Holding meds as noted above.  Please follow up med rec when completed.   DVT prophylaxis: lovenox  Code Status: full Family Communication: nephew  Disposition Plan: pending Consults called: surgery  Admission status: inpatient    Fayrene Helper MD [facility] Pager AMION  If 7PM-7AM, please contact night-coverage [email or web address] Password [facility]  10/06/2018, 5:48 PM

## 2018-10-03 NOTE — ED Triage Notes (Signed)
Per EMS, pt complains of n/v/lower abd pain x 2 days. Pt states he has not had an appetite for the past couple of days.   141/90 HR 88

## 2018-10-03 NOTE — ED Notes (Signed)
ED TO INPATIENT HANDOFF REPORT  Name/Age/Gender Gregory [name] 85 y.o. male  Code Status Code Status History    Date Active Date Inactive Code Status Order ID Comments User Context   01/24/2017 1127 01/25/2017 1610 Full Code [id number]  Lorretta Harp, MD Inpatient   09/21/2016 1042 09/23/2016 1840 DNR [id number]  Knox Royalty, NP Inpatient   09/18/2016 0048 09/21/2016 1042 Full Code [id number]  Sid Falcon, MD Inpatient   08/05/2016 1219 08/22/2016 1838 Full Code [id number]  Kalman Drape, [place] [facility]   03/26/2016 1044 04/01/2016 1734 Full Code [id number]  Troy Sine, MD Inpatient   02/26/202016 0316 10/04/2014 1854 Full Code [id number]  Orvan Falconer, MD ED   01/30/2014 2126 02/01/2014 1619 Full Code [id number]  Robbie Lis, MD Inpatient      Home/SNF/Other Home  Chief Complaint abdominal pain   Level of Care/Admitting Diagnosis ED Disposition    ED Disposition Condition [place] [facility] Area: [facility] [[id number]]  Level of Care: Telemetry [5]  Admit to tele based on following criteria: Monitor for Ischemic changes  Diagnosis: SBO (small bowel obstruction) [facility]) [696789]  Admitting Physician: Elodia Florence [phone]  Attending Physician: Cephus Slater, A [place] [phone]  Estimated length of stay: past midnight tomorrow  Certification:: I certify this patient will need inpatient services for at least 2 midnights  PT Class (Do Not Modify): Inpatient [101]  PT Acc Code (Do Not Modify): Private [1]       Medical History Past Medical History:  Diagnosis Date  . Arthritis    "legs, knees" (08/05/2016)  . CAD (coronary artery disease)    a. cath 03/2016: diffuse disease w/ 50% ostial LM stenosis, 75% ostial Cx, 50% 3rd Mrg, 65% distal RCA, and 50% RPDA. Medical management recommended.  . CHF (congestive heart failure) ([place])   . Critical lower limb ischemia 01/24/2017  . GIB (gastrointestinal bleeding)    a. occurred in 2016, secondary to gastritis and  diverticulosis  . History of gout   . History of stomach ulcers   . Hypercholesteremia   . Hypertension   . Hypertensive heart disease   . LVH (left ventricular hypertrophy)    a. 03/2016 Echo: EF 55-65%, no rwma, Gr1 DD, sev LVH, mildly dil RA/LA, small peric eff;  b. 03/2016 Cardiac MRI: sev LVH, mildly dil RV, possible RV thrombus, diffuse LGE involving RV and diff subendocardial LGE in LV. *No M spike on SPEP, nl immunofixation pattern - no clear evidence of cardiac amyloidosis.  . Murmur, cardiac   . PAF (paroxysmal atrial fibrillation) ([place])    a. new-onset 03/2016, placed on Eliquis (CHA2DS2VASc = 6).  . Pericardial effusion    a. diagnosed in 07/2016. Underwent pericardiocentesis at that time.   . Pneumonia    "quite a few times" (08/05/2016)  . Poor appetite   . Prostate cancer ([place])   . STEMI (ST elevation myocardial infarction) ([place]) 03/2016   Archie Endo 03/26/2016  . Stroke [facility])    a. nonhemorrhagic CVA in 2015    Allergies Allergies  Allergen Reactions  . Sulfa Antibiotics Rash  . Ramipril     unknown    IV Location/Drains/Wounds Patient Lines/Drains/Airways Status   Active Line/Drains/Airways    Name:   Placement date:   Placement time:   Site:   Days:   Peripheral IV 10/04/2018 Left Forearm   10/03/18    0805    Forearm   less than 1  NG/OG Tube Nasogastric 16 Fr. Left nare Xray   09/18/2018    1235    Left nare   less than 1   Incision (Closed) 08/05/16 Abdomen Other (Comment)   08/05/16    1641     789   Incision - 2 Ports Abdomen Right;Lateral;Medial Left;Upper;Lateral   08/05/16    -     789   Pressure Injury 09/18/16 Stage II -  Partial thickness loss of dermis presenting as a shallow open ulcer with a red, pink wound bed without slough. Apperance is consistent with full thickness wounds related to moisture associated skin damage in inner glu   09/18/16    0130     745   Wound / Incision (Open or Dehisced) 01/24/17 Non-pressure wound Ankle Left dry  with scab wound    01/24/17    1130    Ankle   617          Labs/Imaging Results for orders placed or performed during the hospital encounter of 10/09/2018 (from the past 48 hour(s))  Lipase, blood     Status: None   Collection Time: 10/10/2018  8:05 AM  Result Value Ref Range   Lipase 32 11 - 51 U/L    Comment: Performed at [facility], [place] [address] [place], [place] [address]  Comprehensive metabolic panel     Status: Abnormal   Collection Time: 10/02/2018  8:05 AM  Result Value Ref Range   Sodium 136 135 - 145 mmol/L   Potassium 3.6 3.5 - 5.1 mmol/L   Chloride 103 98 - 111 mmol/L   CO2 23 22 - 32 mmol/L   Glucose, Bld 103 (H) 70 - 99 mg/dL   BUN 21 8 - 23 mg/dL   Creatinine, Ser 0.92 0.61 - 1.24 mg/dL   Calcium 9.0 8.9 - 10.3 mg/dL   Total Protein 7.5 6.5 - 8.1 g/dL   Albumin 3.6 3.5 - 5.0 g/dL   AST 34 15 - 41 U/L   ALT 17 0 - 44 U/L   Alkaline Phosphatase 102 38 - 126 U/L   Total Bilirubin 1.1 0.3 - 1.2 mg/dL   GFR calc non Af Amer >60 >60 mL/min   GFR calc Af Amer >60 >60 mL/min   Anion gap 10 5 - 15    Comment: Performed at [facility] [place] [address] [place], [place] [address]  CBC     Status: Abnormal   Collection Time: 09/26/2018  8:05 AM  Result Value Ref Range   WBC 11.1 (H) 4.0 - 10.5 K/uL   RBC 4.60 4.22 - 5.81 MIL/uL   Hemoglobin 12.9 (L) 13.0 - 17.0 g/dL   HCT 40.3 39.0 - 52.0 %   MCV 87.6 80.0 - 100.0 fL   MCH 28.0 26.0 - 34.0 pg   MCHC 32.0 30.0 - 36.0 g/dL   RDW 17.5 (H) 11.5 - 15.5 %   Platelets 200 150 - 400 K/uL   nRBC 0.0 0.0 - 0.2 %    Comment: Performed at [facility], [place] [address] [place], [place] [address]  Urinalysis, Routine w reflex microscopic     Status: None   Collection Time: 09/29/2018  8:05 AM  Result Value Ref Range   Color, Urine YELLOW YELLOW   APPearance CLEAR CLEAR   Specific Gravity, Urine 1.014 1.005 - 1.030   pH 7.0 5.0 - 8.0   Glucose, UA NEGATIVE NEGATIVE mg/dL   Hgb urine  dipstick  NEGATIVE NEGATIVE   Bilirubin Urine NEGATIVE NEGATIVE   Ketones, ur NEGATIVE NEGATIVE mg/dL   Protein, ur NEGATIVE NEGATIVE mg/dL   Nitrite NEGATIVE NEGATIVE   Leukocytes,Ua NEGATIVE NEGATIVE    Comment: Performed at [place] [address] [place], [place] [address]  Troponin I - Add-On to previous collection     Status: Abnormal   Collection Time: 10/11/2018  8:05 AM  Result Value Ref Range   Troponin I 0.07 (HH) <0.03 ng/mL    Comment: CRITICAL RESULT CALLED TO, READ BACK BY AND VERIFIED WITHCharlton Haws RN AT [phone] 09/20/2018 MULLINS,T Performed at [facility], [place] [address] [place], [place] [address]   Lactic acid, plasma     Status: None   Collection Time: 10/02/2018  9:35 AM  Result Value Ref Range   Lactic Acid, Venous 1.2 0.5 - 1.9 mmol/L    Comment: Performed at [facility], [place] [address], [place], [place] [address]   Dg Abdomen 1 View  Result Date: 10/06/2018 CLINICAL DATA:  Nasogastric tube placement and small-bowel obstruction. EXAM: ABDOMEN - 1 VIEW COMPARISON:  Film earlier at 1223 hours FINDINGS: The nasogastric tube has now been straightened with the tip just barely below the diaphragm and likely at the GE junction. No significant change in small bowel dilatation primarily involving proximal small bowel loops. IMPRESSION: Nasogastric tube straightened with tip likely at the GE junction. The tube should be advanced at least 6 cm in order to position the side hole in the proximal stomach. Electronically Signed   By: Aletta Edouard M.D.   On: 09/21/2018 14:05   Dg Abdomen 1 View  Result Date: 10/04/2018 CLINICAL DATA:  Is gastric tube placement EXAM: ABDOMEN - 1 VIEW COMPARISON:  Portable exam 1223 hours compared to CT abdomen and pelvis of 09/24/2018 FINDINGS: Tip of nasogastric tube is coiled in the distal esophagus and reflected cranially. Dilated loops of small bowel with paucity of colonic gas/stool  question small bowel obstruction. No bowel wall thickening. Bones demineralized with multilevel degenerative disc disease changes of the thoracolumbar spine. Excreted contrast material within bladder. IMPRESSION: Question small bowel obstruction. Nasogastric tube coiled in the distal esophagus with tip directed cranially; recommend withdrawal/replacement or repositioning. Findings called to [facility] in ED on 09/16/2018 at 1306 hours. Electronically Signed   By: Lavonia Dana M.D.   On: 09/16/2018 13:07   Ct Abdomen Pelvis W Contrast  Result Date: 10/11/2018 CLINICAL DATA:  Nausea, vomiting, lower abdominal pain EXAM: CT ABDOMEN AND PELVIS WITH CONTRAST TECHNIQUE: Multidetector CT imaging of the abdomen and pelvis was performed using the standard protocol following bolus administration of intravenous contrast. CONTRAST:  162mL ISOVUE-300 IOPAMIDOL (ISOVUE-300) INJECTION 61% COMPARISON:  08/05/2016 FINDINGS: Lower chest: Coronary artery calcifications. Hepatobiliary: No focal liver abnormality is seen. Gallstones. No gallbladder wall thickening, or biliary dilatation. Pancreas: Unremarkable. No pancreatic ductal dilatation or surrounding inflammatory changes. Spleen: Normal in size without focal abnormality. Adrenals/Urinary Tract: Adrenal glands are unremarkable. Kidneys are normal, without renal calculi, focal lesion, or hydronephrosis. Bladder is unremarkable. Stomach/Bowel: Stomach is within normal limits. The small bowel is diffusely distended with air and fluid levels. There is an anastomosis of the distal small bowel, with a focal, tight transition point in the mid abdomen just proximal to the anastomosis (series 2, image 45). Vascular/Lymphatic: No significant vascular findings are present. No enlarged abdominal or pelvic lymph nodes. Reproductive: Postoperative findings of prostatectomy with numerous surgical clips about the pelvis. Other: No abdominal  wall hernia or abnormality. Small volume ascites.  Musculoskeletal: No acute or significant osseous findings. IMPRESSION: 1. The small bowel is diffusely distended with air and fluid levels. There is an anastomosis of the distal small bowel, new compared to prior examination dated 08/05/2016, with a focal, tight transition point in the mid abdomen just proximal to the anastomosis (series 2, image 45). There is scattered gas and stool in the generally decompressed colon. Findings are concerning for small bowel obstruction. 2. Small volume ascites, non-specific although likely reactive in the setting of bowel obstruction. 3. Chronic, incidental, and postoperative findings as detailed above. Electronically Signed   By: Eddie Candle M.D.   On: 09/23/2018 10:44   None  Pending Labs Unresulted Labs (From admission, onward)    Start     Ordered   10/04/18 0500  CBC  Tomorrow morning,   R     10/08/2018 1253   10/04/18 1062  Basic metabolic panel  Tomorrow morning,   R     10/04/2018 1253   10/04/18 0500  Prealbumin  Tomorrow morning,   R     10/07/2018 1253   09/16/2018 1251  Troponin I - Now Then Q6H  Now then every 6 hours,   R     09/30/2018 1250   Signed and Held  Comprehensive metabolic panel  Tomorrow morning,   R     Signed and Held   Signed and Held  CBC  Tomorrow morning,   R     Signed and Held          Vitals/Pain Today's Vitals   10/10/2018 0750 10/01/2018 0751 10/10/2018 1000  BP: (!) 157/86  131/84  Pulse: 86  85  Resp: 18  15  Temp: 97.9 F (36.6 C)    TempSrc: Oral    SpO2: 100%  99%  PainSc:  8      Isolation Precautions No active isolations  Medications Medications  iopamidol (ISOVUE-300) 61 % injection (has no administration in time range)  sodium chloride (PF) 0.9 % injection (has no administration in time range)  lidocaine (XYLOCAINE) 2 % jelly (has no administration in time range)  sodium chloride flush (NS) 0.9 % injection 3 mL (3 mLs Intravenous Given 09/21/2018 0804)  fentaNYL (SUBLIMAZE) injection 25 mcg (25 mcg  Intravenous Given 10/12/2018 0934)  ondansetron (ZOFRAN) injection 4 mg (4 mg Intravenous Given 10/11/2018 0933)  iopamidol (ISOVUE-300) 61 % injection 100 mL (100 mLs Intravenous Contrast Given 10/04/2018 1008)  morphine 4 MG/ML injection 4 mg (4 mg Intravenous Given 10/11/2018 1207)    Mobility walks

## 2018-10-03 NOTE — Consult Note (Signed)
Reason for Consult:  SBO Referring Physician: [name] is an 85 y.o. male.    HPI: Patient is an 85 year old male who presents to the ED with 2 days of nausea vomiting and lower abdominal pain. He notes he has been feeling bad the last couple weeks, worse over the last week.  He also notes that his appetites been diminished for the last couple days.  He notes persistent aching throbbing epigastric pain throughout his abdomen for the last week.    He has a history of diagnostic laparoscopy, reduction of strangulated bowel from left inguinal hernia,TAPP repair with phasic's mesh and small bowel resection 08/05/2016 by Dr. Jens Som.  He was seen again in February 2018 with gallstone pancreatitis, elevated LFTs, and possible cholecystitis.  Right upper quadrant ultrasound showed cholelithiasis with sludge and no signs of cholecystitis, MRCP was suggestive of possible cholecystitis with gallbladder wall thickening distention, no pericholecystic fluid gallbladder sludge in the common bile duct with no obvious stones.  He was found to have a Klebsiella bacteremia and HIDA scan was negative.  He was treated medically with a 10 to 14-day course of antibiotics.    Work-up in the ED today shows he is afebrile blood pressure slightly elevated.  Heart rate is in the 80-90 range.  CMP is essentially normal, glucose 103.  Troponin 0 0.07, lactate 1.2, WBC 11.1, hemoglobin 12.9, hematocrit 40.3, platelets 200,000.  Urinalysis is negative.  CT scan today shows the small bowel is diffusely distended with air and fluid levels.  There is an anastomosis of the distal small bowel new compared to the prior exam on 08/05/2016 with a focal tight transition point in the mid abdomen just proximal to the anastomosis there is also scattered gas and stool in general a decompressed colon this was concerning for small bowel obstruction.  There is a small volume of ascites nonspecific likely reactive.  He is  being admitted by medicine and they plan to place an NG tube.  We are asked to see.    Past Medical History:  Diagnosis Date  . Arthritis    "legs, knees" (08/05/2016)  . CAD (coronary artery disease)    a. cath 03/2016: diffuse disease w/ 50% ostial LM stenosis, 75% ostial Cx, 50% 3rd Mrg, 65% distal RCA, and 50% RPDA. Medical management recommended.  . CHF (congestive heart failure) ([place])   . Critical lower limb ischemia 01/24/2017  . GIB (gastrointestinal bleeding)    a. occurred in 2016, secondary to gastritis and diverticulosis  . History of gout   . History of stomach ulcers   . Hypercholesteremia   . Hypertension   . Hypertensive heart disease   . LVH (left ventricular hypertrophy)    a. 03/2016 Echo: EF 55-65%, no rwma, Gr1 DD, sev LVH, mildly dil RA/LA, small peric eff;  b. 03/2016 Cardiac MRI: sev LVH, mildly dil RV, possible RV thrombus, diffuse LGE involving RV and diff subendocardial LGE in LV. *No M spike on SPEP, nl immunofixation pattern - no clear evidence of cardiac amyloidosis.  . Murmur, cardiac   . PAF (paroxysmal atrial fibrillation) ([place])    a. new-onset 03/2016, placed on Eliquis (CHA2DS2VASc = 6).  . Pericardial effusion    a. diagnosed in 07/2016. Underwent pericardiocentesis at that time.   . Pneumonia    "quite a few times" (08/05/2016)  . Poor appetite   . Prostate cancer ([place])   . STEMI (ST elevation myocardial infarction) ([place]) 03/2016   Gregory [name]  03/26/2016  . Stroke [facility])    a. nonhemorrhagic CVA in 2015    Past Surgical History:  Procedure Laterality Date  . ABDOMINAL AORTOGRAM W/LOWER EXTREMITY N/A 01/24/2017   Procedure: Abdominal Aortogram w/Lower Extremity;  Surgeon: Lorretta Harp, MD;  Location: [place] [facility];  Service: Cardiovascular;  Laterality: N/A;  . CARDIAC CATHETERIZATION N/A 03/26/2016   Procedure: Left Heart Cath and Coronary Angiography;  Surgeon: Troy Sine, MD;  Location: [place] [facility];  Service: Cardiovascular;   Laterality: N/A;  . CARDIAC CATHETERIZATION N/A 08/06/2016   Procedure: Pericardiocentesis;  Surgeon: Sherren Mocha, MD;  Location: [place] [facility];  Service: Cardiovascular;  Laterality: N/A;  . COLONOSCOPY WITH PROPOFOL N/A 10/04/2014   Procedure: COLONOSCOPY WITH PROPOFOL;  Surgeon: Laurence Spates, MD;  Location: [place];  Service: Endoscopy;  Laterality: N/A;  . DIAGNOSTIC LAPAROSCOPY  08/05/2016   reduction of strangulated small bowel from left inguinal hernia, TAPP repair with phasix mesh, small bowel resection/notes 08/05/2016  . ESOPHAGOGASTRODUODENOSCOPY N/A 10/03/2014   Procedure: ESOPHAGOGASTRODUODENOSCOPY (EGD);  Surgeon: Winfield Cunas., MD;  Location: [facility];  Service: Endoscopy;  Laterality: N/A;  . HERNIA REPAIR    . LAPAROSCOPY N/A 08/05/2016   Procedure: LAPAROSCOPY DIAGNOSTIC AND LAPAROSCOPIC INGUINAL HERNIA REPAIR.;  Surgeon: Clovis Riley, MD;  Location: [place];  Service: General;  Laterality: N/A;  . LAPAROTOMY N/A 08/05/2016   Procedure: Anne Fu WITH  SMALL BOWEL RESECTION.;  Surgeon: Clovis Riley, MD;  Location: [place];  Service: General;  Laterality: N/A;  . PERIPHERAL VASCULAR ATHERECTOMY  01/24/2017   Procedure: Peripheral Vascular Atherectomy;  Surgeon: Lorretta Harp, MD;  Location: [place] [facility];  Service: Cardiovascular;;  Lt SFA  . PROSTATECTOMY  1990s?    Family History  Problem Relation Age of Onset  . Hypertension Mother     Social History:  reports that he has never smoked. He has never used smokeless tobacco. He reports that he does not drink alcohol or use drugs. He lives alone, still drives some, has nephew's that help care for him.   Tobacco:  None ETOH:  None Drugs:  None He worked at [employer] before retirement  Allergies:  Allergies  Allergen Reactions  . Sulfa Antibiotics Rash  . Ramipril     unknown    Prior to Admission medications   Medication Sig Start Date End Date Taking? Authorizing  Provider  amiodarone (PACERONE) 100 MG tablet Take 1 tablet (100 mg total) by mouth daily. 06/03/17   Troy Sine, MD  amiodarone (PACERONE) 200 MG tablet TAKE (1/2) TABLET DAILY. 02/23/18   Troy Sine, MD  amLODipine (NORVASC) 5 MG tablet TAKE 1 TABLET EACH DAY. Patient taking differently: Take 5 mg by mouth daily.  03/01/18   Lorretta Harp, MD  aspirin EC 81 MG EC tablet Take 1 tablet (81 mg total) by mouth daily. 01/26/17   Delos Haring, PA-C  atorvastatin (LIPITOR) 40 MG tablet TAKE 1 TABLET BY MOUTH DAILY. Patient taking differently: Take 40 mg by mouth daily at 6 PM.  01/30/18   Troy Sine, MD  brimonidine (ALPHAGAN) 0.2 % ophthalmic solution Place 1 drop into both eyes at bedtime. 09/23/16   Ghimire, Henreitta Leber, MD  clopidogrel (PLAVIX) 75 MG tablet TAKE 1 TABLET DAILY WITH BREAKFAST. Patient taking differently: Take 75 mg by mouth daily.  11/29/17   Troy Sine, MD  colchicine 0.6 MG tablet Take 1 tablet (0.6 mg total) by mouth  daily. 08/22/16   Rai, Vernelle Emerald, MD  cyproheptadine (PERIACTIN) 4 MG tablet TAKE ONE TABLET AT BEDTIME. 11/16/17   Troy Sine, MD  ELIQUIS 5 MG TABS tablet TAKE 1 TABLET BY MOUTH TWICE DAILY. 11/29/17   Troy Sine, MD  furosemide (LASIX) 20 MG tablet Take 2 tablets (40 mg total) by mouth daily for 3 days. 07/29/18 08/01/18  Nuala Alpha A, PA-C  levothyroxine (SYNTHROID, LEVOTHROID) 75 MCG tablet TAKE 1 TABLET ONCE DAILY. 11/16/17   Troy Sine, MD  metoprolol (LOPRESSOR) 100 MG tablet Take 1 tablet (100 mg total) by mouth 2 (two) times daily. Patient taking differently: Take 100 mg by mouth daily.  09/23/16   Ghimire, Henreitta Leber, MD  mirtazapine (REMERON SOL-TAB) 15 MG disintegrating tablet Take 0.5 tablets (7.5 mg total) by mouth at bedtime. 09/23/16   Jonetta Osgood, MD  mirtazapine (REMERON) 15 MG tablet TAKE 1/2 TABLET AT BEDTIME. 04/03/18   Lorretta Harp, MD  Multiple Vitamin (MULTIVITAMIN WITH MINERALS) TABS tablet Take 1  tablet by mouth daily. 08/22/16   Rai, Ripudeep K, MD  ondansetron (ZOFRAN-ODT) 4 MG disintegrating tablet Take 1 tablet (4 mg total) by mouth every 6 (six) hours as needed for nausea. 08/22/16   Rai, Ripudeep K, MD  pantoprazole (PROTONIX) 40 MG tablet Take 1 tablet (40 mg total) by mouth daily. 02/02/18   Lorretta Harp, MD  potassium chloride (K-DUR) 10 MEQ tablet TAKE 1 TABLET EACH DAY. 09/15/18   Lorretta Harp, MD  potassium chloride SA (K-DUR,KLOR-CON) 20 MEQ tablet Take 1 tablet (20 mEq total) by mouth daily for 3 days. 07/29/18 08/01/18  Nuala Alpha A, PA-C  sertraline (ZOLOFT) 50 MG tablet Take 1 tablet (50 mg total) by mouth daily. 09/23/16   Ghimire, Henreitta Leber, MD  torsemide (DEMADEX) 20 MG tablet TAKE 1 TABLET BY MOUTH TWICE DAILY. 09/29/18   Troy Sine, MD     Results for orders placed or performed during the hospital encounter of 09/29/2018 (from the past 48 hour(s))  Lipase, blood     Status: None   Collection Time: 10/12/2018  8:05 AM  Result Value Ref Range   Lipase 32 11 - 51 U/L    Comment: Performed at [facility], [place] [address], [place], [place] [address]  Comprehensive metabolic panel     Status: Abnormal   Collection Time: 09/28/2018  8:05 AM  Result Value Ref Range   Sodium 136 135 - 145 mmol/L   Potassium 3.6 3.5 - 5.1 mmol/L   Chloride 103 98 - 111 mmol/L   CO2 23 22 - 32 mmol/L   Glucose, Bld 103 (H) 70 - 99 mg/dL   BUN 21 8 - 23 mg/dL   Creatinine, Ser 0.92 0.61 - 1.24 mg/dL   Calcium 9.0 8.9 - 10.3 mg/dL   Total Protein 7.5 6.5 - 8.1 g/dL   Albumin 3.6 3.5 - 5.0 g/dL   AST 34 15 - 41 U/L   ALT 17 0 - 44 U/L   Alkaline Phosphatase 102 38 - 126 U/L   Total Bilirubin 1.1 0.3 - 1.2 mg/dL   GFR calc non Af Amer >60 >60 mL/min   GFR calc Af Amer >60 >60 mL/min   Anion gap 10 5 - 15    Comment: Performed at [facility], [place] [address], [place], [place] [address]  CBC     Status: Abnormal   Collection Time: 10/02/2018   8:05 AM  Result Value Ref Range   WBC 11.1 (H) 4.0 - 10.5 K/uL   RBC 4.60 4.22 - 5.81 MIL/uL   Hemoglobin 12.9 (L) 13.0 - 17.0 g/dL   HCT 40.3 39.0 - 52.0 %   MCV 87.6 80.0 - 100.0 fL   MCH 28.0 26.0 - 34.0 pg   MCHC 32.0 30.0 - 36.0 g/dL   RDW 17.5 (H) 11.5 - 15.5 %   Platelets 200 150 - 400 K/uL   nRBC 0.0 0.0 - 0.2 %    Comment: Performed at [facility], [place] [address] [place], [place] [address]  Urinalysis, Routine w reflex microscopic     Status: None   Collection Time: 09/20/2018  8:05 AM  Result Value Ref Range   Color, Urine YELLOW YELLOW   APPearance CLEAR CLEAR   Specific Gravity, Urine 1.014 1.005 - 1.030   pH 7.0 5.0 - 8.0   Glucose, UA NEGATIVE NEGATIVE mg/dL   Hgb urine dipstick NEGATIVE NEGATIVE   Bilirubin Urine NEGATIVE NEGATIVE   Ketones, ur NEGATIVE NEGATIVE mg/dL   Protein, ur NEGATIVE NEGATIVE mg/dL   Nitrite NEGATIVE NEGATIVE   Leukocytes,Ua NEGATIVE NEGATIVE    Comment: Performed at [facility], [place] [address] [place], [place] [address]  Troponin I - Add-On to previous collection     Status: Abnormal   Collection Time: 10/10/2018  8:05 AM  Result Value Ref Range   Troponin I 0.07 (HH) <0.03 ng/mL    Comment: CRITICAL RESULT CALLED TO, READ BACK BY AND VERIFIED WITHCharlton Haws RN AT [phone] 10/06/2018 MULLINS,T Performed at [facility], [place] [address] [place], [place] [address]   Lactic acid, plasma     Status: None   Collection Time: 09/16/2018  9:35 AM  Result Value Ref Range   Lactic Acid, Venous 1.2 0.5 - 1.9 mmol/L    Comment: Performed at [facility], [place] [address], [place],  [address]    Ct Abdomen Pelvis W Contrast  Result Date: 09/24/2018 CLINICAL DATA:  Nausea, vomiting, lower abdominal pain EXAM: CT ABDOMEN AND PELVIS WITH CONTRAST TECHNIQUE: Multidetector CT imaging of the abdomen and pelvis was performed using the standard protocol following bolus  administration of intravenous contrast. CONTRAST:  182mL ISOVUE-300 IOPAMIDOL (ISOVUE-300) INJECTION 61% COMPARISON:  08/05/2016 FINDINGS: Lower chest: Coronary artery calcifications. Hepatobiliary: No focal liver abnormality is seen. Gallstones. No gallbladder wall thickening, or biliary dilatation. Pancreas: Unremarkable. No pancreatic ductal dilatation or surrounding inflammatory changes. Spleen: Normal in size without focal abnormality. Adrenals/Urinary Tract: Adrenal glands are unremarkable. Kidneys are normal, without renal calculi, focal lesion, or hydronephrosis. Bladder is unremarkable. Stomach/Bowel: Stomach is within normal limits. The small bowel is diffusely distended with air and fluid levels. There is an anastomosis of the distal small bowel, with a focal, tight transition point in the mid abdomen just proximal to the anastomosis (series 2, image 45). Vascular/Lymphatic: No significant vascular findings are present. No enlarged abdominal or pelvic lymph nodes. Reproductive: Postoperative findings of prostatectomy with numerous surgical clips about the pelvis. Other: No abdominal wall hernia or abnormality. Small volume ascites. Musculoskeletal: No acute or significant osseous findings. IMPRESSION: 1. The small bowel is diffusely distended with air and fluid levels. There is an anastomosis of the distal small bowel, new compared to prior examination dated 08/05/2016, with a focal, tight transition point in the mid abdomen just proximal to the anastomosis (series 2, image 45). There is scattered gas and stool in the generally decompressed colon.  Findings are concerning for small bowel obstruction. 2. Small volume ascites, non-specific although likely reactive in the setting of bowel obstruction. 3. Chronic, incidental, and postoperative findings as detailed above. Electronically Signed   By: Eddie Candle M.D.   On: 09/25/2018 10:44    Review of Systems  Constitutional: Positive for weight loss (he's  not sure but about 25 lbs over the last 2 years).  HENT: Negative.        Trying to get NG in and coughing up clear sputum  Eyes: Negative.   Respiratory: Negative.   Cardiovascular: Positive for leg swelling. Negative for chest pain, palpitations, orthopnea, claudication and PND.  Gastrointestinal: Positive for abdominal pain, nausea and vomiting. Negative for constipation and diarrhea.  Genitourinary: Negative.   Musculoskeletal: Negative.   Skin: Negative.   Neurological: Negative.   [name]/Heme/Allergies: Bruises/bleeds easily.  Psychiatric/Behavioral: Negative.    Blood pressure 131/84, pulse 85, temperature 97.9 F (36.6 C), temperature source Oral, resp. rate 15, SpO2 99 %. Physical Exam  Constitutional: He is oriented to person, place, and time. He appears well-developed and well-nourished. No distress.  Frail and malnourished, can't really relate a strong timeline, has been sick a couple weeks, and worse over the last week.  HENT:  Head: Normocephalic.  Mouth/Throat: Oropharynx is clear and moist.  Eyes: Right eye exhibits no discharge. Left eye exhibits no discharge. No scleral icterus.  Pupils are equal  Neck: Normal range of motion. Neck supple. No JVD present. No tracheal deviation present. No thyromegaly present.  Cardiovascular: Normal rate, regular rhythm and normal heart sounds.  No murmur heard. No distal pulses  Respiratory: Effort normal and breath sounds normal. No respiratory distress. He has no wheezes. He has no rales. He exhibits no tenderness.  GI: He exhibits distension. He exhibits no mass. There is abdominal tenderness. There is no rebound and no guarding.  NO BS  Musculoskeletal:        General: Tenderness (he is a little more tender and red RLE) and edema (+2 edema and swelling on the right, Trace LLE) present.  Lymphadenopathy:    He has no cervical adenopathy.  Neurological: He is alert and oriented to person, place, and time. No cranial nerve  deficit.  Skin: He is not diaphoretic.  Dehydrated, dry skin especially lower legs  Psychiatric: He has a normal mood and affect. His behavior is normal. Judgment and thought content normal.    Assessment/Plan: Proximal atrial fibrillation -on amiodarone and Eliquis Mild troponin elevation/Hx cardiac catheterization 03/2016 Hx pericardial effusion with tamponade 09/2015 Left Critical limb ischemia, s/p arteriogram with atherectomy balloon angioplasty 01/2017,Dr. Adora Fridge PVOD CKD Hypertension Dyslipidemia Malnutrition/deconditioning  SBO Hx Small bowel resection with a history of strangulated left inguinal hernia with small bowel resection Tapp repair with phasic mesh 10/07/2015 - Dr. Jens Som  Plan:  Agree with Medicine admit, hold Eliquis (he has been out of medicine x 2 days). NG decompression, and bowel rest.  IV hydration.  He can be on heparin after NG is in and no  bleeding issues.  I hope he will improve with conservative management. He will need clearance for surgery if he does not improve on his own.  I will let them get the NG in and working.  Try SBP later today or in AM.    Layson Bertsch 09/30/2018, 11:39 AM

## 2018-10-04 ENCOUNTER — Inpatient Hospital Stay (HOSPITAL_COMMUNITY): Payer: Medicare Other

## 2018-10-04 LAB — CBC
HCT: 38.6 % — ABNORMAL LOW (ref 39.0–52.0)
Hemoglobin: 12.1 g/dL — ABNORMAL LOW (ref 13.0–17.0)
MCH: 27.8 pg (ref 26.0–34.0)
MCHC: 31.3 g/dL (ref 30.0–36.0)
MCV: 88.7 fL (ref 80.0–100.0)
Platelets: 199 10*3/uL (ref 150–400)
RBC: 4.35 MIL/uL (ref 4.22–5.81)
RDW: 17.3 % — ABNORMAL HIGH (ref 11.5–15.5)
WBC: 5.1 10*3/uL (ref 4.0–10.5)
nRBC: 0 % (ref 0.0–0.2)

## 2018-10-04 LAB — COMPREHENSIVE METABOLIC PANEL
ALT: 14 U/L (ref 0–44)
ANION GAP: 7 (ref 5–15)
AST: 28 U/L (ref 15–41)
Albumin: 3 g/dL — ABNORMAL LOW (ref 3.5–5.0)
Alkaline Phosphatase: 82 U/L (ref 38–126)
BUN: 18 mg/dL (ref 8–23)
CALCIUM: 8.5 mg/dL — AB (ref 8.9–10.3)
CO2: 25 mmol/L (ref 22–32)
CREATININE: 0.84 mg/dL (ref 0.61–1.24)
Chloride: 105 mmol/L (ref 98–111)
GFR calc non Af Amer: >60 mL/min (ref >60)
Glucose, Bld: 90 mg/dL (ref 70–99)
Potassium: 4 mmol/L (ref 3.5–5.1)
Sodium: 137 mmol/L (ref 135–145)
Total Bilirubin: 1.3 mg/dL — ABNORMAL HIGH (ref 0.3–1.2)
Total Protein: 6.2 g/dL — ABNORMAL LOW (ref 6.5–8.1)

## 2018-10-04 LAB — APTT
aPTT: 35 seconds (ref 24–36)
aPTT: 84 seconds — ABNORMAL HIGH (ref 24–36)

## 2018-10-04 LAB — TROPONIN I: Troponin I: 0.06 ng/mL (ref <0.03)

## 2018-10-04 LAB — HEPARIN LEVEL (UNFRACTIONATED)
Heparin Unfractionated: 0.39 IU/mL (ref 0.30–0.70)
Heparin Unfractionated: 0.74 IU/mL — ABNORMAL HIGH (ref 0.30–0.70)

## 2018-10-04 LAB — PREALBUMIN: Prealbumin: 16 mg/dL — ABNORMAL LOW (ref 18–38)

## 2018-10-04 MED ORDER — HALOPERIDOL LACTATE 5 MG/ML IJ SOLN
1.0000 mg | Freq: Once | INTRAMUSCULAR | Status: AC
  Administered 2018-10-04: 1 mg via INTRAMUSCULAR
  Filled 2018-10-04: qty 1

## 2018-10-04 MED ORDER — LORAZEPAM 2 MG/ML IJ SOLN
1.0000 mg | Freq: Once | INTRAMUSCULAR | Status: AC
  Administered 2018-10-04: 1 mg via INTRAVENOUS
  Filled 2018-10-04: qty 1

## 2018-10-04 MED ORDER — HALOPERIDOL LACTATE 5 MG/ML IJ SOLN: 1.0000 mg | Freq: Once | INTRAMUSCULAR | Status: DC

## 2018-10-04 MED ORDER — DIATRIZOATE MEGLUMINE & SODIUM 66-10 % PO SOLN
90.0000 mL | Freq: Once | ORAL | Status: AC
  Administered 2018-10-04: 90 mL via ORAL
  Filled 2018-10-04: qty 90

## 2018-10-04 MED ORDER — HEPARIN (PORCINE) 25000 UT/250ML-% IV SOLN
900.0000 [IU]/h | INTRAVENOUS | Status: DC
  Administered 2018-10-04 – 2018-10-05 (×3): 900 [IU]/h via INTRAVENOUS
  Filled 2018-10-04: qty 250

## 2018-10-04 NOTE — Progress Notes (Addendum)
PROGRESS NOTE  Gregory [name] AOZ:[id number] DOB: Aug 13, 1930 DOA: 10/02/2018 PCP: Janie Morning, DO  HPI/Recap of past 24 hours: Gregory [name] is a 85 y.o. male with medical history significant of coronary disease, peripheral vascular disease, stroke, hypertension, A. fib, prostate cancer many other medical problems presenting with abdominal pain and CT findings concerning for small bowel obstruction.   Patient notes that he has been having symptoms for about 3 weeks.  He describes this pressure all the way around to his belly.  History is difficult as the patient speaks softly is difficult to understand at times.  Additional history was obtained from the nephew, who noted that the patient called a few days ago and complained of abdominal pain in his belly.  He was going to see if he got better, but this morning he was throwing up.  He called the nephew again who called EMS.  Notes his last bowel movement was 2 days ago (though he endorsed diarrhea to the EDP).  He is not really passing gas.  He endorses vomiting and nausea.    10/04/2018: Patient seen and examined at bedside.  Confusion waxes and wanes.  This morning was alert and oriented x3, this afternoon he is agitated and does not know where he is.  Prolonged QTC noted on latest EKG. We will give 1 dose of IV Ativan for agitation.  Assessment/Plan: Active Problems:   SBO (small bowel obstruction) (HCC)  Small Bowel Obstruction:  Independent reviewed of CT abdomen and pelvis which showed findings suggestive of small bowel obstruction.  Unable to pass NG tube x2 Conservative management as recommended by surgery Plan for small bowel protocol today General surgery following Mobilize as tolerated Vital signs are normal WBC trending down from 11.1 to 5.1 Repeat CBC in the morning with differential  Delirium Mentation waxes and wanes Ativan IV as needed Reorient as indicated  QTC prolongation Latest twelve-lead EKG revealed QTC  546 Avoid QTC prolonging agents Patient will not allow repeat twelve-lead EKG to be done  Chronic atrial Fibrillation: Chadsvasc at least ~7.  Holding eliquis for now.  Pt did have some difficulty with NG placement and some bleeding noted.   Start heparin drip  Eliquis on hold Holding PO metoprolol, substituted IV  Elevated troponin Suspect demand ischemia Mildly elevated troponin that peaked at 0.07 and trended down Denies chest pain  Hyperbilirubinemia Total bilirubin 1.3 AST ALT and alkaline phosphatase normal  Coronary Artery Disease  Peripheral Vascular Disease:  S/p directional atherectomy and drug eluting balloon angioplasty of his mid left SFA 01/24/17.  ACS in 2017, cath'd in 03/2016, recommended medical management. Currently holding asa/plavix He doesn't know his medications.  Per Dr. [name] note from 05/2017, he was supposed to eventually d/c plavix and maintain on eliquis and aspirin.  I'm not sure he's done this.  HFpEF; again, pt unsure of meds, awaiting med rec.  Appears euvolemic to dry.   Follow volume status with gentle IVF  Hx Gout: continue colcichine, please confirm on med rec  Hx Depression: holding zoloft for now, holding remeron  Hypothyroidism: continue synthroid IV  Glaucoma: continue eye drops  GERD: H2 blocker   DVT prophylaxis: lovenox  Code Status: full Family Communication:  None at bedside Disposition Plan: pending Consults called: surgery       Objective: Vitals:   09/26/2018 1953 10/04/18 0024 10/04/18 0409 10/04/18 0444  BP: (!) 145/86 137/88 (!) 152/86   Pulse: 77 82 79   Resp: 15  20  Temp: (!) 97.5 F (36.4 C)  98.7 F (37.1 C)   TempSrc: Oral  Oral   SpO2: 97%  100%   Weight:    66.2 kg  Height:        Intake/Output Summary (Last 24 hours) at 10/04/2018 1246 Last data filed at 10/04/2018 0703 Gross per 24 hour  Intake 922.46 ml  Output -  Net 922.46 ml   Filed Weights   10/07/2018 1525 10/04/18 0444    Weight: 65.2 kg 66.2 kg    Exam:  . General: 85 y.o. year-old male well developed well nourished in no acute distress.  Alert and oriented x3.  Mentation waxes and wanes. . Cardiovascular: Regular rate and rhythm with no rubs or gallops.  No thyromegaly or JVD noted.   Marland Kitchen Respiratory: Clear to auscultation with no wheezes or rales. Good inspiratory effort. . Abdomen: Soft nontender nondistended with hypoactive bowel sounds x4 quadrants. . Musculoskeletal: No lower extremity edema. 2/4 pulses in all 4 extremities. Marland Kitchen Psychiatry: Mood is appropriate for condition and setting   Data Reviewed: CBC: Recent Labs  Lab 10/09/2018 0805 10/04/18 0053  WBC 11.1* 5.1  HGB 12.9* 12.1*  HCT 40.3 38.6*  MCV 87.6 88.7  PLT 200 680   Basic Metabolic Panel: Recent Labs  Lab 10/11/2018 0805 10/04/18 0053  NA 136 137  K 3.6 4.0  CL 103 105  CO2 23 25  GLUCOSE 103* 90  BUN 21 18  CREATININE 0.92 0.84  CALCIUM 9.0 8.5*   GFR: Estimated Creatinine Clearance: 59.1 mL/min (by C-G formula based on SCr of 0.84 mg/dL). Liver Function Tests: Recent Labs  Lab 10/04/2018 0805 10/04/18 0053  AST 34 28  ALT 17 14  ALKPHOS 102 82  BILITOT 1.1 1.3*  PROT 7.5 6.2*  ALBUMIN 3.6 3.0*   Recent Labs  Lab 10/02/2018 0805  LIPASE 32   No results for input(s): AMMONIA in the last 168 hours. Coagulation Profile: No results for input(s): INR, PROTIME in the last 168 hours. Cardiac Enzymes: Recent Labs  Lab 10/02/2018 0805 09/20/2018 1414 10/09/2018 1854 10/04/18 0053  TROPONINI 0.07* 0.06* 0.07* 0.06*   BNP (last 3 results) No results for input(s): PROBNP in the last 8760 hours. HbA1C: No results for input(s): HGBA1C in the last 72 hours. CBG: No results for input(s): GLUCAP in the last 168 hours. Lipid Profile: No results for input(s): CHOL, HDL, LDLCALC, TRIG, CHOLHDL, LDLDIRECT in the last 72 hours. Thyroid Function Tests: No results for input(s): TSH, T4TOTAL, FREET4, T3FREE, THYROIDAB in the  last 72 hours. Anemia Panel: No results for input(s): VITAMINB12, FOLATE, FERRITIN, TIBC, IRON, RETICCTPCT in the last 72 hours. Urine analysis:    Component Value Date/Time   COLORURINE YELLOW 09/28/2018 0805   APPEARANCEUR CLEAR 09/22/2018 0805   LABSPEC 1.014 10/01/2018 0805   PHURINE 7.0 10/08/2018 0805   GLUCOSEU NEGATIVE 09/27/2018 0805   HGBUR NEGATIVE 09/25/2018 0805   BILIRUBINUR NEGATIVE 09/27/2018 0805   KETONESUR NEGATIVE 10/12/2018 0805   PROTEINUR NEGATIVE 10/08/2018 0805   UROBILINOGEN 4.0 (H) 01/30/2014 1402   NITRITE NEGATIVE 09/26/2018 0805   LEUKOCYTESUR NEGATIVE 09/19/2018 0805   Sepsis Labs: @LABRCNTIP (procalcitonin:4,lacticidven:4)  )No results found for this or any previous visit (from the past 240 hour(s)).    Studies: Dg Abd 1 View  Result Date: 10/04/2018 CLINICAL DATA:  Nasogastric tube placement EXAM: ABDOMEN - 1 VIEW COMPARISON:  Yesterday FINDINGS: Advanced nasogastric tube with tip at the descending duodenum. Continued small bowel distension with fluid. Gallstones.  Prior pelvic dissection. IMPRESSION: 1. Advanced nasogastric tube with tip at the descending duodenum. 2. Small bowel obstruction. Electronically Signed   By: Monte Fantasia M.D.   On: 10/04/2018 04:12   Dg Abdomen 1 View  Result Date: 10/04/2018 CLINICAL DATA:  Nasogastric tube placement and small-bowel obstruction. EXAM: ABDOMEN - 1 VIEW COMPARISON:  Film earlier at 1223 hours FINDINGS: The nasogastric tube has now been straightened with the tip just barely below the diaphragm and likely at the GE junction. No significant change in small bowel dilatation primarily involving proximal small bowel loops. IMPRESSION: Nasogastric tube straightened with tip likely at the GE junction. The tube should be advanced at least 6 cm in order to position the side hole in the proximal stomach. Electronically Signed   By: Aletta Edouard M.D.   On: 10/11/2018 14:05   Dg Abdomen 1 View  Result Date:  09/26/2018 CLINICAL DATA:  Is gastric tube placement EXAM: ABDOMEN - 1 VIEW COMPARISON:  Portable exam 1223 hours compared to CT abdomen and pelvis of 09/26/2018 FINDINGS: Tip of nasogastric tube is coiled in the distal esophagus and reflected cranially. Dilated loops of small bowel with paucity of colonic gas/stool question small bowel obstruction. No bowel wall thickening. Bones demineralized with multilevel degenerative disc disease changes of the thoracolumbar spine. Excreted contrast material within bladder. IMPRESSION: Question small bowel obstruction. Nasogastric tube coiled in the distal esophagus with tip directed cranially; recommend withdrawal/replacement or repositioning. Findings called to [facility] in ED on 09/16/2018 at 1306 hours. Electronically Signed   By: Lavonia Dana M.D.   On: 10/11/2018 13:07    Scheduled Meds: . brimonidine  1 drop Both Eyes QHS  . colchicine  0.6 mg Oral Daily  . levothyroxine  37.5 mcg Intravenous Daily  . LORazepam  1 mg Intravenous Once  . metoprolol tartrate  5 mg Intravenous Q6H    Continuous Infusions: . famotidine (PEPCID) IV 20 mg (10/04/18 0935)  . heparin    . lactated ringers 75 mL/hr at 10/04/18 0703     LOS: 1 day     Kayleen Memos, MD [facility] Pager [phone]  If 7PM-7AM, please contact night-coverage [email or web address] Password [facility] 10/04/2018, 12:46 PM

## 2018-10-04 NOTE — Progress Notes (Signed)
Patient now pulled 2nd NG-tube out, tried to reinsert NG tube unsuccessful and patient refusing the NG tube as well when another nurse attempt to insert the tube.

## 2018-10-04 NOTE — Progress Notes (Signed)
Pt increasingly agitated this afternoon. Repeatedly attempting to get out of bed and not following commands. Combative towards staff. Numerous attempts made to reorient patient. Pt refused to believe he was in the hospital. RN called pt's son Gregory [name] to update him on pt's status and request his presence at the bedside. Ronnie unable to visit pt at this time, but did speak to pt over the phone. MD made aware of acute change. 1mg  Ativan ordered but produced little effect. Additional 1mg  Ativan ordered. Mitts applied. Pt now sleeping comfortably in the bed.  Anicka Stuckert, Bing Neighbors, RN

## 2018-10-04 NOTE — Progress Notes (Signed)
[place] for heparin Indication: hx atrial fibrillation (PTA Eliquis on hold)  Allergies  Allergen Reactions  . Sulfa Antibiotics Rash  . Ramipril     unknown    Patient Measurements: Height: 5\' 11"  (180.3 cm) Weight: 145 lb 15.1 oz (66.2 kg) IBW/kg (Calculated) : 75.3 Heparin Dosing Weight: 65 kg  Vital Signs: Temp: 98.2 F (36.8 C) (02/19 2059) Temp Source: Oral (02/19 2059) BP: 136/93 (02/19 2059) Pulse Rate: 87 (02/19 2059)  Labs: Recent Labs    10/08/2018 0805 10/04/2018 1414 09/26/2018 1854 10/04/18 0053 10/04/18 1030 10/04/18 2158  HGB 12.9*  --   --  12.1*  --   --   HCT 40.3  --   --  38.6*  --   --   PLT 200  --   --  199  --   --   APTT  --   --   --   --  35 84*  HEPARINUNFRC  --   --   --   --  0.39 0.74*  CREATININE 0.92  --   --  0.84  --   --   TROPONINI 0.07* 0.06* 0.07* 0.06*  --   --     Estimated Creatinine Clearance: 59.1 mL/min (by C-G formula based on SCr of 0.84 mg/dL).   Medications:  - on Eliquis 5 mg bid PTA (per pt, thought he took at 2pm on 2/17)  Assessment: Patient is an 85 y.o M with hx afib on Eliquis PTA, presented to the ED on 2/18 with c/o abdominal pain.  Abd CT on 2/18 showed findings with concern for SBO. CCS recommends holding off on surgical intervention at this time. Eliquis held on admission d/t NPO status. To start heparin on 2/19 while patient is NPO and Eliquis is on hold.  Today, 10/04/2018: - baseline aPTT 35, heparin level 0.39 (elevated level is likely due to residual effect of Eliquis) - cbc ok - no bleeding documented - last lovenox 40 mg SQ q24h dose given at 10p pn 2/18 -2200 HL=0.74 and aptt = 84 sec, using aptts for now, no infusion or bleeding issues per RN   Goal of Therapy:  Heparin level 0.3-0.7 units/ml aPTT 66-102 seconds Monitor platelets by anticoagulation protocol: Yes   Plan:  - continue heparin drip at 900 units/hr - Daily HL - Recheck aptt in  am  Dorrene German 10/04/2018,10:56 PM

## 2018-10-04 NOTE — Progress Notes (Signed)
[place] for heparin Indication: hx atrial fibrillation (PTA Eliquis on hold)  Allergies  Allergen Reactions  . Sulfa Antibiotics Rash  . Ramipril     unknown    Patient Measurements: Height: 5\' 11"  (180.3 cm) Weight: 145 lb 15.1 oz (66.2 kg) IBW/kg (Calculated) : 75.3 Heparin Dosing Weight: 65 kg  Vital Signs: Temp: 98.7 F (37.1 C) (02/19 0409) Temp Source: Oral (02/19 0409) BP: 152/86 (02/19 0409) Pulse Rate: 79 (02/19 0409)  Labs: Recent Labs    10/11/2018 0805 10/01/2018 1414 09/30/2018 1854 10/04/18 0053  HGB 12.9*  --   --  12.1*  HCT 40.3  --   --  38.6*  PLT 200  --   --  199  CREATININE 0.92  --   --  0.84  TROPONINI 0.07* 0.06* 0.07* 0.06*    Estimated Creatinine Clearance: 59.1 mL/min (by C-G formula based on SCr of 0.84 mg/dL).   Medications:  - on Eliquis 5 mg bid PTA (per pt, thought he took at 2pm on 2/17)  Assessment: Patient is an 85 y.o M with hx afib on Eliquis PTA, presented to the ED on 2/18 with c/o abdominal pain.  Abd CT on 2/18 showed findings with concern for SBO. CCS recommends holding off on surgical intervention at this time. Eliquis held on admission d/t NPO status. To start heparin on 2/19 while patient is NPO and Eliquis is on hold.  Today, 10/04/2018: - baseline aPTT 35, heparin level 0.39 (elevated level is likely due to residual effect of Eliquis) - cbc ok - no bleeding documented - last lovenox 40 mg SQ q24h dose given at 10p pn 2/18   Goal of Therapy:  Heparin level 0.3-0.7 units/ml aPTT 66-102 seconds Monitor platelets by anticoagulation protocol: Yes   Plan:  - d/c lovenox 40 mg daily - start heparin drip at 900 units/hr - check 8 hr heparin level and aPTT  Sheetal Lyall P 10/04/2018,10:34 AM

## 2018-10-04 NOTE — Progress Notes (Signed)
Patient started getting confused and combative, pulled IV out x2 and NG tube x1 getting out of bed because he wants to go home.  Refusing IV restart.  Notified Kirby-NP on call provider.

## 2018-10-04 NOTE — Progress Notes (Signed)
[facility] Progress Note     Subjective: CC: patient irritated about mittens and not being able to answer phone Patient denies abdominal pain this AM. Reports passing some flatus, unclear on whether he has had any nausea. No family at bedside this AM. Patient is upset because he has mittens on. Pulled out multiple IVs and NGT last night, patient denies this.   Objective: Vital signs in last 24 hours: Temp:  [97.5 F (36.4 C)-98.7 F (37.1 C)] 98.7 F (37.1 C) (02/19 0409) Pulse Rate:  [77-88] 79 (02/19 0409) Resp:  [15-20] 20 (02/19 0409) BP: (131-152)/(84-92) 152/86 (02/19 0409) SpO2:  [95 %-100 %] 100 % (02/19 0409) Weight:  [65.2 kg-66.2 kg] 66.2 kg (02/19 0444) Last BM Date: 10/02/18  Intake/Output from previous day: 02/18 0701 - 02/19 0700 In: 688 [P.O.:50; I.V.:588; IV Piggyback:50] Out: -  Intake/Output this shift: Total I/O In: 234.5 [I.V.:234.5] Out: -   PE: Gen:  Alert, NAD Card:  Regular rate and rhythm Pulm:  Normal effort, clear to auscultation bilaterally Abd: Soft, non-tender, non-distended, bowel sounds hypoactive Skin: warm and dry, no rashes    Lab Results:  Recent Labs    09/21/2018 0805 10/04/18 0053  WBC 11.1* 5.1  HGB 12.9* 12.1*  HCT 40.3 38.6*  PLT 200 199   BMET Recent Labs    10/06/2018 0805 10/04/18 0053  NA 136 137  K 3.6 4.0  CL 103 105  CO2 23 25  GLUCOSE 103* 90  BUN 21 18  CREATININE 0.92 0.84  CALCIUM 9.0 8.5*   PT/INR No results for input(s): LABPROT, INR in the last 72 hours. CMP     Component Value Date/Time   NA 137 10/04/2018 0053   NA 141 01/11/2017 1036   K 4.0 10/04/2018 0053   CL 105 10/04/2018 0053   CO2 25 10/04/2018 0053   GLUCOSE 90 10/04/2018 0053   BUN 18 10/04/2018 0053   BUN 18 01/11/2017 1036   CREATININE 0.84 10/04/2018 0053   CREATININE 0.67 (L) 116-Mar-202017 0825   CALCIUM 8.5 (L) 10/04/2018 0053   PROT 6.2 (L) 10/04/2018 0053   ALBUMIN 3.0 (L) 10/04/2018 0053   AST 28  10/04/2018 0053   ALT 14 10/04/2018 0053   ALKPHOS 82 10/04/2018 0053   BILITOT 1.3 (H) 10/04/2018 0053   GFRNONAA >60 10/04/2018 0053   GFRAA >60 10/04/2018 0053   Lipase     Component Value Date/Time   LIPASE 32 10/12/2018 0805       Studies/Results: Dg Abd 1 View  Result Date: 10/04/2018 CLINICAL DATA:  Nasogastric tube placement EXAM: ABDOMEN - 1 VIEW COMPARISON:  Yesterday FINDINGS: Advanced nasogastric tube with tip at the descending duodenum. Continued small bowel distension with fluid. Gallstones. Prior pelvic dissection. IMPRESSION: 1. Advanced nasogastric tube with tip at the descending duodenum. 2. Small bowel obstruction. Electronically Signed   By: Monte Fantasia M.D.   On: 10/04/2018 04:12   Dg Abdomen 1 View  Result Date: 10/01/2018 CLINICAL DATA:  Nasogastric tube placement and small-bowel obstruction. EXAM: ABDOMEN - 1 VIEW COMPARISON:  Film earlier at 1223 hours FINDINGS: The nasogastric tube has now been straightened with the tip just barely below the diaphragm and likely at the GE junction. No significant change in small bowel dilatation primarily involving proximal small bowel loops. IMPRESSION: Nasogastric tube straightened with tip likely at the GE junction. The tube should be advanced at least 6 cm in order to position the side hole in the proximal stomach.  Electronically Signed   By: Aletta Edouard M.D.   On: 09/20/2018 14:05   Dg Abdomen 1 View  Result Date: 10/06/2018 CLINICAL DATA:  Is gastric tube placement EXAM: ABDOMEN - 1 VIEW COMPARISON:  Portable exam 1223 hours compared to CT abdomen and pelvis of 09/23/2018 FINDINGS: Tip of nasogastric tube is coiled in the distal esophagus and reflected cranially. Dilated loops of small bowel with paucity of colonic gas/stool question small bowel obstruction. No bowel wall thickening. Bones demineralized with multilevel degenerative disc disease changes of the thoracolumbar spine. Excreted contrast material within  bladder. IMPRESSION: Question small bowel obstruction. Nasogastric tube coiled in the distal esophagus with tip directed cranially; recommend withdrawal/replacement or repositioning. Findings called to [facility] in ED on 10/04/2018 at 1306 hours. Electronically Signed   By: Lavonia Dana M.D.   On: 10/02/2018 13:07   Ct Abdomen Pelvis W Contrast  Result Date: 10/02/2018 CLINICAL DATA:  Nausea, vomiting, lower abdominal pain EXAM: CT ABDOMEN AND PELVIS WITH CONTRAST TECHNIQUE: Multidetector CT imaging of the abdomen and pelvis was performed using the standard protocol following bolus administration of intravenous contrast. CONTRAST:  110mL ISOVUE-300 IOPAMIDOL (ISOVUE-300) INJECTION 61% COMPARISON:  08/05/2016 FINDINGS: Lower chest: Coronary artery calcifications. Hepatobiliary: No focal liver abnormality is seen. Gallstones. No gallbladder wall thickening, or biliary dilatation. Pancreas: Unremarkable. No pancreatic ductal dilatation or surrounding inflammatory changes. Spleen: Normal in size without focal abnormality. Adrenals/Urinary Tract: Adrenal glands are unremarkable. Kidneys are normal, without renal calculi, focal lesion, or hydronephrosis. Bladder is unremarkable. Stomach/Bowel: Stomach is within normal limits. The small bowel is diffusely distended with air and fluid levels. There is an anastomosis of the distal small bowel, with a focal, tight transition point in the mid abdomen just proximal to the anastomosis (series 2, image 45). Vascular/Lymphatic: No significant vascular findings are present. No enlarged abdominal or pelvic lymph nodes. Reproductive: Postoperative findings of prostatectomy with numerous surgical clips about the pelvis. Other: No abdominal wall hernia or abnormality. Small volume ascites. Musculoskeletal: No acute or significant osseous findings. IMPRESSION: 1. The small bowel is diffusely distended with air and fluid levels. There is an anastomosis of the distal small bowel, new  compared to prior examination dated 08/05/2016, with a focal, tight transition point in the mid abdomen just proximal to the anastomosis (series 2, image 45). There is scattered gas and stool in the generally decompressed colon. Findings are concerning for small bowel obstruction. 2. Small volume ascites, non-specific although likely reactive in the setting of bowel obstruction. 3. Chronic, incidental, and postoperative findings as detailed above. Electronically Signed   By: Eddie Candle M.D.   On: 10/06/2018 10:44    Anti-infectives: Anti-infectives (From admission, onward)   None       Assessment/Plan Proximal atrial fibrillation -on amiodarone and Eliquis Mild troponin elevation/Hx cardiac catheterization 03/2016 Hx pericardial effusion with tamponade 09/2015 Left Critical limb ischemia, s/p arteriogram with atherectomy balloon angioplasty 01/2017,Dr. Adora Fridge CKD Hypertension Dyslipidemia Malnutrition/deconditioning Hx of prostate cancer   SBO - patient removed NGT x2 overnight - can leave out for now - abdominal film this AM with dilated loops of small bowel, although abdominal exam fairly reassuring - does not appear patient was given gastrografin yesterday - recommend PO gastrografin with 8h delay film - if patient begins having BMs would initiate CLD and advance diet as tolerated - hopefully this will resolve without any surgical intervention - patient would need medical/cardiac clearance prior to surgical intervention   FEN: NPO, IVF VTE: SCDs, lovenox  ID: no abx indicated   LOS: 1 day    Brigid Re , [facility] Surgery 10/04/2018, 9:51 AM Pager: [place]: [phone]

## 2018-10-05 ENCOUNTER — Inpatient Hospital Stay (HOSPITAL_COMMUNITY): Payer: Medicare Other | Admitting: Certified Registered"

## 2018-10-05 ENCOUNTER — Inpatient Hospital Stay (HOSPITAL_COMMUNITY): Payer: Medicare Other

## 2018-10-05 DIAGNOSIS — A419 Sepsis, unspecified organism: Secondary | ICD-10-CM

## 2018-10-05 DIAGNOSIS — I469 Cardiac arrest, cause unspecified: Secondary | ICD-10-CM

## 2018-10-05 DIAGNOSIS — R570 Cardiogenic shock: Secondary | ICD-10-CM

## 2018-10-05 DIAGNOSIS — I340 Nonrheumatic mitral (valve) insufficiency: Secondary | ICD-10-CM

## 2018-10-05 DIAGNOSIS — I361 Nonrheumatic tricuspid (valve) insufficiency: Secondary | ICD-10-CM

## 2018-10-05 DIAGNOSIS — I9789 Other postprocedural complications and disorders of the circulatory system, not elsewhere classified: Secondary | ICD-10-CM

## 2018-10-05 DIAGNOSIS — R6521 Severe sepsis with septic shock: Secondary | ICD-10-CM

## 2018-10-05 LAB — COOXEMETRY PANEL
Carboxyhemoglobin: 0.3 % — ABNORMAL LOW (ref 0.5–1.5)
Methemoglobin: 1 % (ref 0.0–1.5)
O2 Saturation: 57 %
Total hemoglobin: 8.5 g/dL — ABNORMAL LOW (ref 12.0–16.0)

## 2018-10-05 LAB — BASIC METABOLIC PANEL
Anion gap: 15 (ref 5–15)
Anion gap: 16 — ABNORMAL HIGH (ref 5–15)
BUN: 32 mg/dL — AB (ref 8–23)
BUN: 40 mg/dL — ABNORMAL HIGH (ref 8–23)
CO2: 28 mmol/L (ref 22–32)
CO2: 14 mmol/L — ABNORMAL LOW (ref 22–32)
Calcium: 7.6 mg/dL — ABNORMAL LOW (ref 8.9–10.3)
Calcium: 6.6 mg/dL — ABNORMAL LOW (ref 8.9–10.3)
Chloride: 106 mmol/L (ref 98–111)
Chloride: 111 mmol/L (ref 98–111)
Creatinine, Ser: 1.5 mg/dL — ABNORMAL HIGH (ref 0.61–1.24)
Creatinine, Ser: 1.94 mg/dL — ABNORMAL HIGH (ref 0.61–1.24)
GFR calc Af Amer: 48 mL/min — ABNORMAL LOW (ref >60)
GFR calc Af Amer: 35 mL/min — ABNORMAL LOW (ref >60)
GFR calc non Af Amer: 30 mL/min — ABNORMAL LOW (ref >60)
GFR, EST NON AFRICAN AMERICAN: 42 mL/min — AB (ref >60)
Glucose, Bld: 34 mg/dL — CL (ref 70–99)
Glucose, Bld: 189 mg/dL — ABNORMAL HIGH (ref 70–99)
POTASSIUM: 2.7 mmol/L — AB (ref 3.5–5.1)
Potassium: 4.4 mmol/L (ref 3.5–5.1)
SODIUM: 141 mmol/L (ref 135–145)
Sodium: 149 mmol/L — ABNORMAL HIGH (ref 135–145)

## 2018-10-05 LAB — GLUCOSE, CAPILLARY
Glucose-Capillary: 29 mg/dL — CL (ref 70–99)
Glucose-Capillary: 125 mg/dL — ABNORMAL HIGH (ref 70–99)
Glucose-Capillary: 204 mg/dL — ABNORMAL HIGH (ref 70–99)
Glucose-Capillary: 45 mg/dL — ABNORMAL LOW (ref 70–99)
Glucose-Capillary: 123 mg/dL — ABNORMAL HIGH (ref 70–99)

## 2018-10-05 LAB — BLOOD GAS, ARTERIAL
ACID-BASE DEFICIT: 3.2 mmol/L — AB (ref 0.0–2.0)
ACID-BASE DEFICIT: 12 mmol/L — AB (ref 0.0–2.0)
Acid-base deficit: 11.7 mmol/L — ABNORMAL HIGH (ref 0.0–2.0)
Acid-base deficit: 12.9 mmol/L — ABNORMAL HIGH (ref 0.0–2.0)
BICARBONATE: 12.9 mmol/L — AB (ref 20.0–28.0)
Bicarbonate: 19.9 mmol/L — ABNORMAL LOW (ref 20.0–28.0)
Bicarbonate: 13.5 mmol/L — ABNORMAL LOW (ref 20.0–28.0)
Bicarbonate: 12.9 mmol/L — ABNORMAL LOW (ref 20.0–28.0)
DRAWN BY: 331471
Drawn by: 225631
Drawn by: 331471
Drawn by: 331471
FIO2: 100
FIO2: 100
FIO2: 100
FIO2: 100
MECHVT: 0.58 mL
MECHVT: 450 mL
O2 SAT: 76.1 %
O2 Saturation: 80.1 %
O2 Saturation: 86.1 %
O2 Saturation: 79.5 %
PATIENT TEMPERATURE: 98.6
PEEP/CPAP: 8 cmH2O
PEEP/CPAP: 10 cmH2O
PEEP: 5 cmH2O
PEEP: 8 cmH2O
PH ART: 7.251 — AB (ref 7.350–7.450)
Patient temperature: 98.6
Patient temperature: 98.6
Patient temperature: 98.6
RATE: 18 resp/min
RATE: 28 resp/min
RATE: 28 resp/min
RATE: 32 resp/min
VT: 580 mL
VT: 580 mL
pCO2 arterial: 30.7 mmHg — ABNORMAL LOW (ref 32.0–48.0)
pCO2 arterial: 27.1 mmHg — ABNORMAL LOW (ref 32.0–48.0)
pCO2 arterial: 29.4 mmHg — ABNORMAL LOW (ref 32.0–48.0)
pCO2 arterial: 30.4 mmHg — ABNORMAL LOW (ref 32.0–48.0)
pH, Arterial: 7.428 (ref 7.350–7.450)
pH, Arterial: 7.3 — ABNORMAL LOW (ref 7.350–7.450)
pH, Arterial: 7.284 — ABNORMAL LOW (ref 7.350–7.450)
pO2, Arterial: 49.5 mmHg — ABNORMAL LOW (ref 83.0–108.0)
pO2, Arterial: 64.2 mmHg — ABNORMAL LOW (ref 83.0–108.0)
pO2, Arterial: 50.7 mmHg — ABNORMAL LOW (ref 83.0–108.0)
pO2, Arterial: 55.7 mmHg — ABNORMAL LOW (ref 83.0–108.0)

## 2018-10-05 LAB — CBC
HCT: 37.2 % — ABNORMAL LOW (ref 39.0–52.0)
Hemoglobin: 11.2 g/dL — ABNORMAL LOW (ref 13.0–17.0)
MCH: 27.6 pg (ref 26.0–34.0)
MCHC: 30.1 g/dL (ref 30.0–36.0)
MCV: 91.6 fL (ref 80.0–100.0)
PLATELETS: 162 10*3/uL (ref 150–400)
RBC: 4.06 MIL/uL — AB (ref 4.22–5.81)
RDW: 17.3 % — ABNORMAL HIGH (ref 11.5–15.5)
WBC: 4.8 10*3/uL (ref 4.0–10.5)
nRBC: 0.6 % — ABNORMAL HIGH (ref 0.0–0.2)

## 2018-10-05 LAB — PHOSPHORUS: Phosphorus: 4.2 mg/dL (ref 2.5–4.6)

## 2018-10-05 LAB — ECHOCARDIOGRAM COMPLETE
Height: 71 in
Weight: 2335.11 oz

## 2018-10-05 LAB — MAGNESIUM: MAGNESIUM: 1.8 mg/dL (ref 1.7–2.4)

## 2018-10-05 LAB — APTT: aPTT: 60 seconds — ABNORMAL HIGH (ref 24–36)

## 2018-10-05 LAB — HEPARIN LEVEL (UNFRACTIONATED): Heparin Unfractionated: 0.62 IU/mL (ref 0.30–0.70)

## 2018-10-05 LAB — SAMPLE TO BLOOD BANK

## 2018-10-05 LAB — TROPONIN I: Troponin I: 6.77 ng/mL (ref <0.03)

## 2018-10-05 MED ORDER — FENTANYL BOLUS VIA INFUSION
50.0000 ug | INTRAVENOUS | Status: DC | PRN
  Filled 2018-10-05: qty 50

## 2018-10-05 MED ORDER — FENTANYL CITRATE (PF) 100 MCG/2ML IJ SOLN
100.0000 ug | Freq: Once | INTRAMUSCULAR | Status: AC
  Administered 2018-10-05: 100 ug via INTRAVENOUS

## 2018-10-05 MED ORDER — PHENYLEPHRINE HCL-NACL 10-0.9 MG/250ML-% IV SOLN
0.0000 ug/min | INTRAVENOUS | Status: DC
  Administered 2018-10-05: 20 ug/min via INTRAVENOUS
  Filled 2018-10-05: qty 250
  Filled 2018-10-05: qty 500
  Filled 2018-10-05: qty 250

## 2018-10-05 MED ORDER — SUCCINYLCHOLINE CHLORIDE 200 MG/10ML IV SOSY
PREFILLED_SYRINGE | INTRAVENOUS | Status: AC
  Filled 2018-10-05: qty 10

## 2018-10-05 MED ORDER — DEXTROSE 10 % IV SOLN
INTRAVENOUS | Status: DC
  Administered 2018-10-05: 09:00:00 via INTRAVENOUS
  Filled 2018-10-05: qty 1000

## 2018-10-05 MED ORDER — NOREPINEPHRINE 4 MG/250ML-% IV SOLN
0.0000 ug/min | INTRAVENOUS | Status: DC
  Filled 2018-10-05: qty 250

## 2018-10-05 MED ORDER — EPINEPHRINE PF 1 MG/ML IJ SOLN
0.5000 ug/min | INTRAVENOUS | Status: DC
  Administered 2018-10-05 (×2): 20 ug/min via INTRAVENOUS
  Administered 2018-10-05: 0.5 ug/min via INTRAVENOUS
  Filled 2018-10-05 (×4): qty 4

## 2018-10-05 MED ORDER — SODIUM CHLORIDE 0.9 % IV SOLN
3.0000 ug/kg/min | INTRAVENOUS | Status: DC
  Administered 2018-10-05: 3 ug/kg/min via INTRAVENOUS
  Filled 2018-10-05: qty 20

## 2018-10-05 MED ORDER — AMIODARONE HCL IN DEXTROSE 360-4.14 MG/200ML-% IV SOLN
60.0000 mg/h | INTRAVENOUS | Status: AC
  Administered 2018-10-05: 60 mg/h via INTRAVENOUS
  Filled 2018-10-05 (×2): qty 200

## 2018-10-05 MED ORDER — SUCCINYLCHOLINE CHLORIDE 20 MG/ML IJ SOLN
INTRAMUSCULAR | Status: DC | PRN
  Administered 2018-10-05: 100 mg via INTRAVENOUS

## 2018-10-05 MED ORDER — SODIUM BICARBONATE 8.4 % IV SOLN
INTRAVENOUS | Status: DC
  Administered 2018-10-05: 13:00:00 via INTRAVENOUS
  Filled 2018-10-05: qty 150

## 2018-10-05 MED ORDER — SODIUM BICARBONATE 8.4 % IV SOLN
INTRAVENOUS | Status: AC
  Administered 2018-10-05: 04:00:00
  Filled 2018-10-05: qty 50

## 2018-10-05 MED ORDER — DOBUTAMINE IN D5W 4-5 MG/ML-% IV SOLN
5.0000 ug/kg/min | INTRAVENOUS | Status: DC
  Administered 2018-10-05: 5 ug/kg/min via INTRAVENOUS

## 2018-10-05 MED ORDER — LACTATED RINGERS IV BOLUS
1000.0000 mL | Freq: Once | INTRAVENOUS | Status: AC
  Administered 2018-10-05: 1000 mL via INTRAVENOUS

## 2018-10-05 MED ORDER — SODIUM BICARBONATE 8.4 % IV SOLN
INTRAVENOUS | Status: AC
  Filled 2018-10-05: qty 50

## 2018-10-05 MED ORDER — HYDROCORTISONE NA SUCCINATE PF 100 MG IJ SOLR
50.0000 mg | Freq: Four times a day (QID) | INTRAMUSCULAR | Status: DC
  Administered 2018-10-05: 50 mg via INTRAVENOUS
  Filled 2018-10-05: qty 2

## 2018-10-05 MED ORDER — ALBUMIN HUMAN 25 % IV SOLN
25.0000 g | Freq: Once | INTRAVENOUS | Status: AC
  Administered 2018-10-05: 100 g via INTRAVENOUS
  Filled 2018-10-05: qty 200

## 2018-10-05 MED ORDER — DEXTROSE 50 % IV SOLN
INTRAVENOUS | Status: AC
  Administered 2018-10-05: 50 mL
  Filled 2018-10-05: qty 100

## 2018-10-05 MED ORDER — VANCOMYCIN HCL 10 G IV SOLR
1500.0000 mg | Freq: Once | INTRAVENOUS | Status: AC
  Administered 2018-10-05: 1500 mg via INTRAVENOUS
  Filled 2018-10-05: qty 1500

## 2018-10-05 MED ORDER — MIDAZOLAM BOLUS VIA INFUSION
2.0000 mg | INTRAVENOUS | Status: DC | PRN
  Filled 2018-10-05: qty 2

## 2018-10-05 MED ORDER — NOREPINEPHRINE 4 MG/250ML-% IV SOLN
0.0000 ug/min | INTRAVENOUS | Status: DC
  Administered 2018-10-05: 40 ug/min via INTRAVENOUS
  Filled 2018-10-05 (×2): qty 250

## 2018-10-05 MED ORDER — PHENYLEPHRINE HCL-NACL 10-0.9 MG/250ML-% IV SOLN: 0.0000 ug/min | INTRAVENOUS | Status: DC

## 2018-10-05 MED ORDER — MIDAZOLAM HCL 2 MG/2ML IJ SOLN
2.0000 mg | Freq: Once | INTRAMUSCULAR | Status: AC
  Administered 2018-10-05: 2 mg via INTRAVENOUS

## 2018-10-05 MED ORDER — MIDAZOLAM 50MG/50ML (1MG/ML) PREMIX INFUSION
2.0000 mg/h | INTRAVENOUS | Status: DC
  Administered 2018-10-05: 2 mg/h via INTRAVENOUS
  Filled 2018-10-05: qty 50

## 2018-10-05 MED ORDER — HYDROCORTISONE NA SUCCINATE PF 100 MG IJ SOLR
50.0000 mg | Freq: Four times a day (QID) | INTRAMUSCULAR | Status: DC
  Administered 2018-10-05: 50 mg via INTRAMUSCULAR
  Filled 2018-10-05: qty 2

## 2018-10-05 MED ORDER — ASPIRIN 300 MG RE SUPP
300.0000 mg | Freq: Once | RECTAL | Status: AC
  Administered 2018-10-05: 300 mg via RECTAL
  Filled 2018-10-05: qty 1

## 2018-10-05 MED ORDER — PIPERACILLIN-TAZOBACTAM 3.375 G IVPB
3.3750 g | Freq: Three times a day (TID) | INTRAVENOUS | Status: DC
  Administered 2018-10-05: 3.375 g via INTRAVENOUS
  Filled 2018-10-05: qty 50

## 2018-10-05 MED ORDER — CISATRACURIUM BOLUS VIA INFUSION
0.0500 mg/kg | Freq: Once | INTRAVENOUS | Status: AC
  Administered 2018-10-05: 3.3 mg via INTRAVENOUS
  Filled 2018-10-05: qty 4

## 2018-10-05 MED ORDER — ALBUMIN HUMAN 25 % IV SOLN
INTRAVENOUS | Status: AC
  Filled 2018-10-05: qty 50

## 2018-10-05 MED ORDER — ARTIFICIAL TEARS OPHTHALMIC OINT
1.0000 "application " | TOPICAL_OINTMENT | Freq: Three times a day (TID) | OPHTHALMIC | Status: DC
  Administered 2018-10-05 (×2): 1 via OPHTHALMIC
  Filled 2018-10-05: qty 3.5

## 2018-10-05 MED ORDER — FENTANYL 2500MCG IN NS 250ML (10MCG/ML) PREMIX INFUSION
100.0000 ug/h | INTRAVENOUS | Status: DC
  Administered 2018-10-05: 100 ug/h via INTRAVENOUS

## 2018-10-05 MED ORDER — PHENYLEPHRINE HCL-NACL 40-0.9 MG/250ML-% IV SOLN
0.0000 ug/min | INTRAVENOUS | Status: DC
  Administered 2018-10-05 (×4): 400 ug/min via INTRAVENOUS
  Filled 2018-10-05 (×7): qty 250

## 2018-10-05 MED ORDER — FENTANYL 2500MCG IN NS 250ML (10MCG/ML) PREMIX INFUSION
25.0000 ug/h | INTRAVENOUS | Status: DC
  Administered 2018-10-05: 50 ug/h via INTRAVENOUS
  Administered 2018-10-05: 25 ug/h via INTRAVENOUS
  Filled 2018-10-05: qty 250

## 2018-10-05 MED ORDER — POTASSIUM CHLORIDE 10 MEQ/50ML IV SOLN
10.0000 meq | INTRAVENOUS | Status: DC
  Administered 2018-10-05 (×2): 10 meq via INTRAVENOUS
  Filled 2018-10-05 (×5): qty 50

## 2018-10-05 MED ORDER — CHLORHEXIDINE GLUCONATE CLOTH 2 % EX PADS
6.0000 | MEDICATED_PAD | Freq: Every day | CUTANEOUS | Status: DC
  Administered 2018-10-05: 6 via TOPICAL

## 2018-10-05 MED ORDER — VASOPRESSIN 20 UNIT/ML IV SOLN
0.0300 [IU]/min | INTRAVENOUS | Status: DC
  Filled 2018-10-05: qty 2

## 2018-10-05 MED ORDER — FENTANYL CITRATE (PF) 100 MCG/2ML IJ SOLN
50.0000 ug | Freq: Once | INTRAMUSCULAR | Status: AC
  Administered 2018-10-05: 50 ug via INTRAVENOUS
  Filled 2018-10-05: qty 2

## 2018-10-05 MED ORDER — MIDAZOLAM HCL 2 MG/2ML IJ SOLN: 2.0000 mg | Freq: Once | INTRAMUSCULAR | Status: DC | PRN

## 2018-10-05 MED ORDER — ALBUMIN HUMAN 25 % IV SOLN
25.0000 g | Freq: Once | INTRAVENOUS | Status: AC
  Administered 2018-10-05: 12.5 g via INTRAVENOUS
  Filled 2018-10-05: qty 50

## 2018-10-05 MED ORDER — SODIUM BICARBONATE 8.4 % IV SOLN
50.0000 meq | Freq: Once | INTRAVENOUS | Status: AC
  Administered 2018-10-05: 50 meq via INTRAVENOUS

## 2018-10-05 MED ORDER — FENTANYL CITRATE (PF) 100 MCG/2ML IJ SOLN: 100.0000 ug | Freq: Once | INTRAMUSCULAR | Status: DC | PRN

## 2018-10-05 MED ORDER — SODIUM CHLORIDE 0.9% FLUSH
10.0000 mL | Freq: Two times a day (BID) | INTRAVENOUS | Status: DC
  Administered 2018-10-05 (×2): 10 mL

## 2018-10-05 MED ORDER — VANCOMYCIN HCL 10 G IV SOLR: 1500.0000 mg | INTRAVENOUS | Status: DC

## 2018-10-05 MED ORDER — ALBUMIN HUMAN 25 % IV SOLN
INTRAVENOUS | Status: AC
  Filled 2018-10-05: qty 200

## 2018-10-05 MED ORDER — ORAL CARE MOUTH RINSE
15.0000 mL | OROMUCOSAL | Status: DC
  Administered 2018-10-05 (×2): 15 mL via OROMUCOSAL

## 2018-10-05 MED ORDER — NOREPINEPHRINE 16 MG/250ML-% IV SOLN
0.0000 ug/min | INTRAVENOUS | Status: DC
  Administered 2018-10-05: 50 ug/min via INTRAVENOUS
  Administered 2018-10-05: 40 ug/min via INTRAVENOUS
  Filled 2018-10-05 (×4): qty 250

## 2018-10-05 MED ORDER — SODIUM CHLORIDE 0.9% FLUSH: 10.0000 mL | INTRAVENOUS | Status: DC | PRN

## 2018-10-05 MED ORDER — FENTANYL BOLUS VIA INFUSION
25.0000 ug | INTRAVENOUS | Status: DC | PRN
  Filled 2018-10-05: qty 25

## 2018-10-05 MED ORDER — AMIODARONE HCL IN DEXTROSE 360-4.14 MG/200ML-% IV SOLN
30.0000 mg/h | INTRAVENOUS | Status: DC
  Administered 2018-10-05: 30 mg/h via INTRAVENOUS

## 2018-10-05 MED ORDER — VASOPRESSIN 20 UNIT/ML IV SOLN
0.0300 [IU]/min | INTRAVENOUS | Status: DC
  Administered 2018-10-05: 0.03 [IU]/min via INTRAVENOUS
  Filled 2018-10-05 (×2): qty 2

## 2018-10-05 MED ORDER — CHLORHEXIDINE GLUCONATE 0.12% ORAL RINSE (MEDLINE KIT)
15.0000 mL | Freq: Two times a day (BID) | OROMUCOSAL | Status: DC
  Administered 2018-10-05: 15 mL via OROMUCOSAL

## 2018-10-05 MED ORDER — PIPERACILLIN-TAZOBACTAM 3.375 G IVPB 30 MIN
3.3750 g | INTRAVENOUS | Status: AC
  Administered 2018-10-05: 3.375 g via INTRAVENOUS
  Filled 2018-10-05: qty 50

## 2018-10-05 MED ORDER — PANTOPRAZOLE SODIUM 40 MG IV SOLR: 40.0000 mg | Freq: Every day | INTRAVENOUS | Status: DC

## 2018-10-05 MED ORDER — DOBUTAMINE IN D5W 4-5 MG/ML-% IV SOLN
2.5000 ug/kg/min | INTRAVENOUS | Status: DC
  Administered 2018-10-05: 2.5 ug/kg/min via INTRAVENOUS
  Filled 2018-10-05: qty 250

## 2018-10-05 MED ORDER — VANCOMYCIN HCL 10 G IV SOLR: 1250.0000 mg | INTRAVENOUS | Status: DC

## 2018-10-05 MED FILL — Medication: Qty: 2 | Status: AC

## 2018-10-09 LAB — GLUCOSE, CAPILLARY: Glucose-Capillary: 19 mg/dL — CL (ref 70–99)

## 2018-10-10 ENCOUNTER — Telehealth: Payer: Self-pay | Admitting: *Deleted

## 2018-10-10 NOTE — Telephone Encounter (Signed)
Received original D/C from [employer]-D/C forwarded to Dr.Olalere to be signed.

## 2018-10-11 NOTE — Telephone Encounter (Signed)
Received original signed D/C. [facility] notified for pick up.  

## 2018-10-15 NOTE — Progress Notes (Signed)
[facility] Progress Note Patient Name: Gregory [name] DOB: 1929-09-18 MRN: [id number]   Date of Service  October 06, 2018  HPI/Events of Note  Now maxed out on 4 pressors. MAP < 65. SABP 81/54. RN asking for any other meds.  Considering cardiogenic shock, echo at AM, will start a trial of dobutamine.   eICU Interventions  - Dobutamine at 2.5 continuous for now.  - consider eval early in AM.      Intervention Category Major Interventions: Shock - evaluation and management  Elmer Sow 2018/10/06, 6:44 AM

## 2018-10-15 NOTE — Progress Notes (Signed)
PULMONARY / CRITICAL CARE MEDICINE   NAME:  Gregory [name], MRN:  [id number], DOB:  04/26/1930, LOS: 2 ADMISSION DATE:  10/06/2018, CONSULTATION DATE: October 19, 2018 REFERRING MD: Hospitalist, CHIEF COMPLAINT: Cardiac arrest  BRIEF HISTORY:    85 year old male admitted with small bowel obstruction status post cardiac arrest on the floor during the early a.m. hours on 2/20  SIGNIFICANT PAST MEDICAL HISTORY   Coronary artery disease, peripheral vascular disease, history of gastritis, diastolic heart dysfunction.  Paroxysmal atrial fibrillation, pneumonia, ST elevation MI, prior stroke, remote prostate cancer, hypertension.   SIGNIFICANT EVENTS:  2/18: Admitted with a small bowel obstruction 2/19: Worsening delirium.  Intermittent agitation.  Getting occasional Ativan, not getting Haldol due to QTC concerns 2/20: Ventricular fibrillation arrest,Required 3 separate episodes of defibrillation.  Intubated, femoral line placed, transferred to the ICU.  In refractory shock requiring 3 pressors.  Heparin started. STUDIES:   CT abdomen 2/18: Consistent with small bowel obstruction 2/20: Echocardiogram  CULTURES:  Blood culture 2/20>>> Sputum 2/20>>>  ANTIBIOTICS:  Zosyn 2/19>>> Vancomycin 2/19>>>  LINES/TUBES:   Right femoral line 10/19/2018 Endotracheal tube to 22,020 CONSULTANTS:  Cardiology Surgery SUBJECTIVE:    CONSTITUTIONAL: Blood Pressure (Abnormal) 76/34   Pulse 87   Temperature (Abnormal) 97.5 F (36.4 C) (Axillary)   Respiration (Abnormal) 36   Height 5\' 11"  (1.803 m)   Weight 66.2 kg   Oxygen Saturation 97%   Body Mass Index 20.36 kg/m   I/O last 3 completed shifts: In: 5364.3 [P.O.:50; I.V.:4584.1; IV Piggyback:730.2] Out: 1700 [Urine:1100; Emesis/NG output:600]     Vent Mode: PRVC FiO2 (%):  [100 %] 100 % Set Rate:  [18 bmp-28 bmp] 28 bmp Vt Set:  [520 mL-580 mL] 580 mL PEEP:  [5 cmH20-8 cmH20] 8 cmH20 Plateau Pressure:  [22 EXN17-00 cmH20] 34  cmH20  PHYSICAL EXAM: General: This is a very frail 85 year old male he is currently on full ventilatory support on multiple pressors.  He still opens his eyes to command.  Systolic pressure in the 17C HEENT: Orally intubated mild JVD mucous membranes moist Pulmonary: Coarse, marked accessory use, tachypneic Cardiac: Irregular irregular with atrial fibrillation with multiple ectopic beats noted on telemetry Extremities: Warm, chronic lower extremity edema, pulses are palpable Abdomen: Distended, hypoactive.  NG tube with dark bilious fluid Neuro: Opens eyes to request, moves all extremities without focal deficits GU: Concentrated urine  RESOLVED PROBLEM LIST   ASSESSMENT AND PLAN    Status post ventricular fibrillation arrest Troponins elevated consistent with demand ischemia -Has a significant history of coronary artery disease, ischemic heart disease, prior right bundle blanch block and grade 2 diastolic heart failure. Not clear if this was primary cardiac event or simply all due to demand ischemia in the setting of his acute illness Plan Continue telemetry monitoring IV heparin Holding off on beta-blockade given shock state Continue amiodarone infusion  Circulatory shock.  Most likely sepsis, however given cardiac history concerned about cardiogenic shock as well particularly with ventricular arrhythmias -Currently on 3 pressors -No significant improvement with dobutamine Plan We will place pulmonary artery catheter to better understand hemodynamics If hemodynamic picture more consistent with sepsis there is nothing more to offer at this point If this is primarily cardiogenic we can ask cardiology to see, I do not think he would be a good candidate for balloon pump Will continue to titrate vasoactive drips for mean arterial pressure greater than 65 Assess volume status and ensure central venous pressure greater than 8 Regularly acid-base Continue stress dose steroids Not  a  candidate for Giapreza given concern for acute coronary syndrome  Check SVO 2 and serial lactic acid Follow-up echocardiogram  Acute hypoxic respiratory failure status post arrest with diffuse pulmonary infiltrates, presumed aspiration pneumonia with evolving ARDS, cannot exclude element of pulmonary edema Portable chest x-ray personally reviewed.  This demonstrates endotracheal tube in satisfactory position there is right greater than left airspace disease appreciated. Plan Transition to ARDS protocol Nimbex infusion x48 hours PAD protocol RASS -5 VAP bundle Serial chest x-ray and ABGs PRN  Small bowel obstruction Plan Bowel rest Continue gastric decompression Abdominal films daily Surgery following, not currently a candidate for surgery given shock state  Hypoglycemia Plan Stress dose steroids D10 infusion  Acute kidney injury status post cardiac arrest.  Almost certainly this will continue to worsen given ongoing shock state Plan Renal dose medications Try to avoid hypotension if able Serial chemistries  strict intake output I do not think he is a candidate for dialysis  Mild hypernatremia Plan Changing maintenance IV fluids to D5 half-normal Follow-up chemistry   Best Practice / Goals of Care / Disposition.   DVT PROPHYLAXIS: Currently on therapeutic heparin SUP: PPI NUTRITION: N.p.o. MOBILITY: Bedrest GOALS OF CARE: Full code FAMILY DISCUSSIONS: Daughter was updated at bedside DISPOSITION he is critically ill in refractory shock.  It seems to be a mix of cardiogenic and sepsis.  Not clear to me which is the primary cause.  If this is all sepsis there is little more to offer.  If it is cardiogenic I still do not think he is a candidate for balloon pump, however I am not sure we have maximized his hemodynamics.  He will need ongoing critical care services for titration of hemodynamics, initiation of ARDS protocol with low tidal volume ventilation, and close  observation of laboratory data including blood gas, blood chemistries, and telemetry.  We will be placing a Swan-Ganz catheter later this morning.  If there is clear evidence of cardiogenic etiology we will ask cardiology to assist with his support  My critical care time 60 minutes  Erick Colace ACNP-BC [place] Pager # [phone] OR # [phone] if no answer

## 2018-10-15 NOTE — Significant Event (Addendum)
NP paged that pt had just arrested, with intubation and ROSC, being transferred to ICU. Before NP arrived at room, NP was paged that pt was undergoing ACLS again for cardio arrest with ROSC. NP arrived in room. ED Dr at bedside. Pt had been intubated during first ACLS sequence and had a CXR. Pt was moved to ICU. After being in ICU room for just a few minutes, pt lost pulse again. ACLS was begun and pt was then in VFib and shocked at 69 jules. Epi and Bicarb were given and PCCM Dr arrived and took over care. See PCCM note.   Per RN report, first arrest at 0101 hrs.   While in ICU, NP attempted to get in touch with son listed on chart without success. NP then called other number for a "relative" who states he is pt's nephew. This family member agreed to try to call pt's sons to get them to call the unit. In the meantime, a male arrived to room. Staff thought she was family, but later, when talking with eldest son, NP learned she is a family friend, like an "adopted daughter", but is apparently, not kin.  NP spoke to pt's eldest son on the phone about situation and he wishes for [place] to continue to do everything we can for now, and he will speak to his brother about this and will call back. NP discussed situation and very poor prognosis for survival. KJKG, NP [facility]

## 2018-10-15 NOTE — Progress Notes (Signed)
Rapid response called at 0100 with report of patient to be "agonal breathing". RN confirmed patient's code status. Upon my arrival to the unit at [facility] was initiated. Patient intubated and ROSC. I accompanied patient during transport to room 1238.

## 2018-10-15 NOTE — Progress Notes (Signed)
CRITICAL VALUE ALERT  Critical Value:  Troponin 0.77  Date & Time Notied:  2018/10/28 0525  Provider Notified: Hildred Priest  Orders Received/Actions taken: no new orders received

## 2018-10-15 NOTE — Progress Notes (Signed)
Entered patient's room 1259. Agonal breathing noted. Weak pulse. RR called. Resuscitation began at [facility] patient transferred to ICU 1238. 0141 Left message for Gregory [name] (son). 50 Daughter Gregory [name] call and notified of patient's condition and transfer to room 1238 ICU.  0151 Attempted son again with no answer.

## 2018-10-15 NOTE — Progress Notes (Signed)
Initial Nutrition Assessment  DOCUMENTATION CODES:   (will assess for malnutrition at follow-up)  INTERVENTION:  - Will monitor medical course, GOC.  - Will attempt NFPE at follow-up. - Will monitor for appropriateness of recommendation for nutrition support.    NUTRITION DIAGNOSIS:   Inadequate oral intake related to inability to eat as evidenced by NPO status.  GOAL:   Patient will meet greater than or equal to 90% of their needs  MONITOR:   Vent status, Weight trends, Labs, I & O's  REASON FOR ASSESSMENT:   Ventilator  ASSESSMENT:   85 year old male admitted with small bowel obstruction status post cardiac arrest on the floor during the early a.m. hours on 2/20  BMI indicates normal weight. No family/visitors present at this time. Defer NFPE to follow-up given current status and several RNs in the room to provide care/aid with medications. Patient intubated with NGT to LIS with <50 ml output currently in canister. Per chart review, current weight is 146 lb. Weight on 07/29/18 was 150 lb. This indicates 4 lb weight loss (3% body weight) in the past 2 months; not significant for time frame.  Patient was admitted on 2/18 with SBO. NGT was placed today after cardiac arrest and intubation.   Per Pete's note today: s/p vfib arrest, circulatory shock 2/2 either sepsis or cardiogenic shock, goal MAP <65, presumed aspiration pneumonia, ARDS protocol, SBO with plan for abdominal films daily--not a surgical candidate at this time, hypoglycemia with D10 IV fluids, AKI--likely not a dialysis candidate, mild hypernatremia.    Patient is currently intubated on ventilator support MV: 16.2 L/min Temp (24hrs), Avg:97.8 F (36.6 C), Min:97.5 F (36.4 C), Max:98.2 F (36.8 C) Propofol: none BP: 88/52 and MAP: 64  Medications reviewed; 25 g albumin x2 doses 2/20, 50 mg solu-cortef QID, 37.5 mcg IV synthroid/day, 50 mEq IV sodium bicarb x1 dose 2/20. Labs reviewed; CBGs: 19-125 mg/dl  today, Na: 149 mmol/l, BUN: 32 mg/dl, creatinine: 1.5 mg/dl, Ca: 7.6 mg/dl, GFR: 48 ml/min.  IVF; D10 @ 50 ml/hr (408 kcal); D5-150 mEq sodium bicarb @ 100 ml/hr (408 kcal) started this afternoon. Drips; nimbex @ 3 mcg/kg/min, fentanyl @ 100 mcg/hr, versed @ 2 mg/hr, neo @ 400 mcg/min, vaso @ 0.03 units/min, levo @ 50 mcg/min, epi @ 20 mcg/min, amiodarone @ 30 mg/hr.       NUTRITION - FOCUSED PHYSICAL EXAM:  Will attempt at follow-up.  Diet Order:   Diet Order            Diet NPO time specified  Diet effective now              EDUCATION NEEDS:   Not appropriate for education at this time  Skin:  Skin Assessment: Reviewed RN Assessment  Last BM:  2/17  Height:   Ht Readings from Last 1 Encounters:  2018/10/19 5\' 11"  (1.803 m)    Weight:   Wt Readings from Last 1 Encounters:  10/04/18 66.2 kg    Ideal Body Weight:  78.18 kg  BMI:  Body mass index is 20.36 kg/m.  Estimated Nutritional Needs:   Kcal:  1750 kcal  Protein:  99-112 grams (1.5-1.7 grams/kg)  Fluid:  >/= 2 L/day     Gregory Matin, MS, RD, LDN, [facility] Inpatient Clinical Dietitian Pager # [phone] After hours/weekend pager # [phone]

## 2018-10-15 NOTE — Progress Notes (Cosign Needed Addendum)
eLink Physician-Brief Progress Note Patient Name: EMRAH ARIOLA DOB: September 26, 1929 MRN: [id number]   Date of Service  10/21/18  HPI/Events of Note  85 yr old admitted to ICU.Cardiac arrest  In shock, Resp failure from asp PNA. SBO: aspiration.  S/p CPR X 3. V fib. CAD CHF.  Notes. Labs reviewed.  Camera assessment done. On 3 pressors. MAP soft.  RR up. Not on sedation. Tachycardic. On amiodarone.   Discussed with ed side RN. Marland Kitchen   eICU Interventions  - fentanyl drip ordered - neo/levo drip re ordered. [place] [facility] out of [facility] as per Therapist, sports. - guarded prognosis. Too unstable for LHC.      Intervention Category Major Interventions: Sepsis - evaluation and management;Respiratory failure - evaluation and management;Shock - evaluation and management Minor Interventions: Routine modifications to care plan (e.g. PRN medications for pain, fever) Evaluation Type: New Patient Evaluation  Elmer Sow Oct 21, 2018, 3:39 AM

## 2018-10-15 NOTE — Progress Notes (Signed)
Pharmacy Antibiotic Note  Gregory [name] is a 85 y.o. male admitted on 10/04/2018 with aspiration PNA.  Pharmacy has been consulted for zosyn and vancomycin dosing. S/p cardiac arrest.  Plan: Zosyn 3.375g IV q8h (4 hour infusion). Vancomycin 1500 mg x1 then 1250 mg IV q36h for est AUC = 490 Goal AUC = 400-550 Daily scr F/u cultures/levels  Height: 5\' 11"  (180.3 cm) Weight: 145 lb 15.1 oz (66.2 kg) IBW/kg (Calculated) : 75.3  Temp (24hrs), Avg:98.5 F (36.9 C), Min:98.2 F (36.8 C), Max:98.7 F (37.1 C)  Recent Labs  Lab 09/25/2018 0805 10/08/2018 0935 10/04/18 0053 2018-10-08 0214  WBC 11.1*  --  5.1 4.8  CREATININE 0.92  --  0.84  --   LATICACIDVEN  --  1.2  --   --     Estimated Creatinine Clearance: 59.1 mL/min (by C-G formula based on SCr of 0.84 mg/dL).    Allergies  Allergen Reactions  . Sulfa Antibiotics Rash  . Ramipril     unknown    Antimicrobials this admission: 2/20 zosyn >>  2/20 vancomycin >>   Dose adjustments this admission:   Microbiology results:  BCx:   UCx:    Sputum:    MRSA PCR:   Thank you for allowing pharmacy to be a part of this patient's care.  Dorrene German 10/08/18 2:43 AM

## 2018-10-15 NOTE — H&P (Signed)
PULMONARY / CRITICAL CARE MEDICINE   NAME:  Gregory [name], MRN:  [id number], DOB:  1930/05/05, LOS: 2 ADMISSION DATE:  09/19/2018, CONSULTATION DATE: October 29, 2018 REFERRING MD: Hospitalist, CHIEF COMPLAINT: Cardiac arrest  BRIEF HISTORY:    85 year old male admitted with small bowel obstruction status post cardiac arrest on the floor HISTORY OF PRESENT ILLNESS   85 year old male with history of coronary artery disease and peripheral vascular disease was admitted to the hospital 2 days ago with small bowel obstruction I was called tonight after cardiac arrest.  As per reports patient was confused yesterday was given couple doses of Ativan and overnight he was found agonal possibly with vomitus and then he lost his pulse requiring CPR as per ACLS he did have couple of ventricular fibrillation had multiple shocks.  Patient was intubated and was brought into the ICU and upon my arrival patient was in another arrest and he just received 1 shock he regained his pulses and was on Levophed max dose remained hypotensive requiring addition of phenylephrine and vasopressin.  Obviously I could not take any history from the patient due to his altered mental status. SIGNIFICANT PAST MEDICAL HISTORY     Arthritis    "legs, knees" (08/05/2016)  . CAD (coronary artery disease)    a. cath 03/2016: diffuse disease w/ 50% ostial LM stenosis, 75% ostial Cx, 50% 3rd Mrg, 65% distal RCA, and 50% RPDA. Medical management recommended.  . CHF (congestive heart failure) ([place])   . Critical lower limb ischemia 01/24/2017  . GIB (gastrointestinal bleeding)    a. occurred in 2016, secondary to gastritis and diverticulosis  . History of gout   . History of stomach ulcers   . Hypercholesteremia   . Hypertension   . Hypertensive heart disease   . LVH (left ventricular hypertrophy)    a. 03/2016 Echo: EF 55-65%, no rwma, Gr1 DD, sev LVH, mildly dil RA/LA, small peric eff;  b. 03/2016 Cardiac MRI: sev LVH, mildly dil  RV, possible RV thrombus, diffuse LGE involving RV and diff subendocardial LGE in LV. *No M spike on SPEP, nl immunofixation pattern - no clear evidence of cardiac amyloidosis.  . Murmur, cardiac   . PAF (paroxysmal atrial fibrillation) ([place])    a. new-onset 03/2016, placed on Eliquis (CHA2DS2VASc = 6).  . Pericardial effusion    a. diagnosed in 07/2016. Underwent pericardiocentesis at that time.   . Pneumonia    "quite a few times" (08/05/2016)  . Poor appetite   . Prostate cancer ([place])   . STEMI (ST elevation myocardial infarction) ([place]) 03/2016   Archie Endo 03/26/2016  . Stroke [facility])    a. nonhemorrhagic CVA     SIGNIFICANT EVENTS:   STUDIES:     CULTURES:  Blood culture  ANTIBIOTICS:  Zosyn vancomycin  LINES/TUBES:   Right femoral line Oct 29, 2018 Endotracheal tube to 22,020 CONSULTANTS:  Cardiology Surgery SUBJECTIVE:    CONSTITUTIONAL: BP 118/69 (BP Location: Right Arm)   Pulse 87   Temp 98.2 F (36.8 C) (Oral)   Resp 18   Ht 5\' 11"  (1.803 m)   Wt 66.2 kg   SpO2 97%   BMI 20.36 kg/m   I/O last 3 completed shifts: In: 1541.7 [P.O.:50; I.V.:1391.7; IV Piggyback:100] Out: 750 [Urine:750]     Vent Mode: PRVC FiO2 (%):  [100 %] 100 % Set Rate:  [18 bmp] 18 bmp Vt Set:  [520 mL] 520 mL PEEP:  [5 cmH20] 5 cmH20  PHYSICAL EXAM: General: Acutely ill on mechanical ventilation Neuro:  Obtunded not following any commands occasionally moving his right arm HEENT: Endotracheal tube in position Cardiovascular: Normal heart sound no added sounds or murmurs Lungs: Clear equal air sounds bilateral no crackles no wheezing Abdomen: Soft no tenderness no guarding Musculoskeletal: No lower limb edema Skin: No rash  RESOLVED PROBLEM LIST   ASSESSMENT AND PLAN   Assessment: -Post multiple cardiac arrests -Suspected acute MI -Cardiogenic shock -Septic shock -Acute hypoxemic respiratory failure require mechanical ventilation -ventricular fibrillation    Plan: -Patient was transferred to the ICU for further management -Titrate Levophed phenylephrine and vasopressin to keep map above 65 -IV fluid bolus -Start heparin drip empirically -Rectal aspirin - amiodarone drip - discussed with cardiology patient is unstable to go for any procedures we will manage conservatively for now -Discussed with daughter at bedside about his overall condition and she is not in a position to make any goals of care discussion -Start empiric antibiotics Zosyn and vancomycin for aspiration -Send for CBC BMP troponin lactic acid and cardiac enzyme -Echocardiogram   I have spent 50 minutes of critical care time this time was spent bedside or in the unit this time was exclusive of any billable procedures patient is needing intensive care due to cardiogenic shock and acute respiratory require mechanical ventilation. SUMMARY OF TODAY'S PLAN:    Best Practice / Goals of Care / Disposition.   DVT PROPHYLAXIS: SUP: PPI NUTRITION: N.p.o. MOBILITY: Bedrest GOALS OF CARE: Full code FAMILY DISCUSSIONS: Daughter was updated at bedside DISPOSITION ICU  LABS  Glucose No results for input(s): GLUCAP in the last 168 hours.  BMET Recent Labs  Lab 09/28/2018 0805 10/04/18 0053  NA 136 137  K 3.6 4.0  CL 103 105  CO2 23 25  BUN 21 18  CREATININE 0.92 0.84  GLUCOSE 103* 90    Liver Enzymes Recent Labs  Lab 10/10/2018 0805 10/04/18 0053  AST 34 28  ALT 17 14  ALKPHOS 102 82  BILITOT 1.1 1.3*  ALBUMIN 3.6 3.0*    Electrolytes Recent Labs  Lab 10/01/2018 0805 10/04/18 0053  CALCIUM 9.0 8.5*    CBC Recent Labs  Lab 09/24/2018 0805 10/04/18 0053  WBC 11.1* 5.1  HGB 12.9* 12.1*  HCT 40.3 38.6*  PLT 200 199    ABG No results for input(s): PHART, PCO2ART, PO2ART in the last 168 hours.  Coag's Recent Labs  Lab 10/04/18 1030 10/04/18 2158  APTT 35 84*    Sepsis Markers Recent Labs  Lab 09/23/2018 0935  LATICACIDVEN 1.2    Cardiac  Enzymes Recent Labs  Lab 09/16/2018 1414 09/24/2018 1854 10/04/18 0053  TROPONINI 0.06* 0.07* 0.06*    PAST MEDICAL HISTORY :   He  has a past medical history of Arthritis, CAD (coronary artery disease), CHF (congestive heart failure) ([place]), Critical lower limb ischemia (01/24/2017), GIB (gastrointestinal bleeding), History of gout, History of stomach ulcers, Hypercholesteremia, Hypertension, Hypertensive heart disease, LVH (left ventricular hypertrophy), Murmur, cardiac, PAF (paroxysmal atrial fibrillation) ([place]), Pericardial effusion, Pneumonia, Poor appetite, Prostate cancer ([place]), STEMI (ST elevation myocardial infarction) ([place]) (03/2016), and Stroke ([place]).  PAST SURGICAL HISTORY:  He  has a past surgical history that includes Esophagogastroduodenoscopy (N/A, 10/03/2014); Colonoscopy with propofol (N/A, 10/04/2014); Hernia repair; Diagnostic laparoscopy (08/05/2016); Cardiac catheterization (N/A, 03/26/2016); Prostatectomy (1990s?); Cardiac catheterization (N/A, 08/06/2016); laparoscopy (N/A, 08/05/2016); laparotomy (N/A, 08/05/2016); ABDOMINAL AORTOGRAM W/LOWER EXTREMITY (N/A, 01/24/2017); and PERIPHERAL VASCULAR ATHERECTOMY (01/24/2017).  Allergies  Allergen Reactions  . Sulfa Antibiotics Rash  . Ramipril     unknown  No current facility-administered medications on file prior to encounter.    Current Outpatient Medications on File Prior to Encounter  Medication Sig  . Multiple Vitamins-Minerals (CENTRUM SILVER PO) Take by mouth.  . potassium chloride (K-DUR,KLOR-CON) 10 MEQ tablet Take 10 mEq by mouth 2 (two) times daily.  Marland Kitchen amiodarone (PACERONE) 100 MG tablet Take 1 tablet (100 mg total) by mouth daily. (Patient not taking: Reported on 10/01/2018)  . amiodarone (PACERONE) 200 MG tablet TAKE (1/2) TABLET DAILY. (Patient taking differently: Take 100 mg by mouth daily. )  . amLODipine (NORVASC) 5 MG tablet TAKE 1 TABLET EACH DAY. (Patient taking differently: Take 5 mg by mouth daily. )  .  aspirin EC 81 MG EC tablet Take 1 tablet (81 mg total) by mouth daily. (Patient not taking: Reported on 10/02/2018)  . atorvastatin (LIPITOR) 40 MG tablet TAKE 1 TABLET BY MOUTH DAILY. (Patient taking differently: Take 40 mg by mouth daily at 6 PM. )  . brimonidine (ALPHAGAN) 0.2 % ophthalmic solution Place 1 drop into both eyes at bedtime.  . clopidogrel (PLAVIX) 75 MG tablet TAKE 1 TABLET DAILY WITH BREAKFAST. (Patient taking differently: Take 75 mg by mouth daily. )  . colchicine 0.6 MG tablet Take 1 tablet (0.6 mg total) by mouth daily.  . cyproheptadine (PERIACTIN) 4 MG tablet TAKE ONE TABLET AT BEDTIME.  Marland Kitchen ELIQUIS 5 MG TABS tablet TAKE 1 TABLET BY MOUTH TWICE DAILY. (Patient taking differently: Take 5 mg by mouth daily. )  . furosemide (LASIX) 20 MG tablet Take 2 tablets (40 mg total) by mouth daily for 3 days.  Marland Kitchen levothyroxine (SYNTHROID, LEVOTHROID) 75 MCG tablet TAKE 1 TABLET ONCE DAILY.  . metoprolol (LOPRESSOR) 100 MG tablet Take 1 tablet (100 mg total) by mouth 2 (two) times daily. (Patient taking differently: Take 100 mg by mouth daily. )  . mirtazapine (REMERON SOL-TAB) 15 MG disintegrating tablet Take 0.5 tablets (7.5 mg total) by mouth at bedtime. (Patient not taking: Reported on 09/18/2018)  . mirtazapine (REMERON) 15 MG tablet TAKE 1/2 TABLET AT BEDTIME. (Patient taking differently: Take 7.5 mg by mouth at bedtime. )  . Multiple Vitamin (MULTIVITAMIN WITH MINERALS) TABS tablet Take 1 tablet by mouth daily.  . ondansetron (ZOFRAN-ODT) 4 MG disintegrating tablet Take 1 tablet (4 mg total) by mouth every 6 (six) hours as needed for nausea.  . pantoprazole (PROTONIX) 40 MG tablet Take 1 tablet (40 mg total) by mouth daily.  . potassium chloride (K-DUR) 10 MEQ tablet TAKE 1 TABLET EACH DAY.  Marland Kitchen potassium chloride SA (K-DUR,KLOR-CON) 20 MEQ tablet Take 1 tablet (20 mEq total) by mouth daily for 3 days. (Patient not taking: Reported on 10/08/2018)  . sertraline (ZOLOFT) 50 MG tablet Take 1  tablet (50 mg total) by mouth daily.  Marland Kitchen torsemide (DEMADEX) 20 MG tablet TAKE 1 TABLET BY MOUTH TWICE DAILY.    FAMILY HISTORY:   His family history includes Hypertension in his mother.  SOCIAL HISTORY:  He  reports that he has never smoked. He has never used smokeless tobacco. He reports that he does not drink alcohol or use drugs.  REVIEW OF SYSTEMS:    Could not be obtained due to patient altered mental status

## 2018-10-15 NOTE — Progress Notes (Signed)
Elink MD called regarding blood pressure after arterial line insertion. 64/45 (53) while patient is maxed out on vasopressin, norepinephrine and neosynephrine. Awaiting new orders. Will continue to closely monitor.

## 2018-10-15 NOTE — Progress Notes (Signed)
In-room procedure taking place, unable to perform echo.  Will attempt echo at a later time.

## 2018-10-15 NOTE — Procedures (Signed)
Pulmonary Artery Catheter Insertion Procedure Note Gregory [name] [id number] [birth year]  Procedure: Insertion of Pulmonary Artery Catheter  Indications: Guide hemodynamic management  Procedure Details Consent: Risks of procedure as well as the alternatives and risks of each were explained to the (patient/caregiver).  Consent for procedure obtained. Time Out: Verified patient identification, verified procedure, site/side was marked, verified correct patient position, special equipment/implants available, medications/allergies/relevent history reviewed, required imaging and test results available.  Performed Real time [place] used to ID and cannulate vessel  Description of Procedure Maximum sterile technique was used including antiseptics, cap, gloves, gown, hand hygiene, mask and sheet. Skin prep: Chlorhexidine; local anesthetic administered Pulmonary Artery Catheter was placed in the right internal jugular vein; introducer inserted over guidewire, initial position assessed by monitoring pressure waveform and catheter advanced after balloon inflation.  Evaluation Pressure waveform tracings: good, Pulmonary capillary wedge tracing: good, wedge tracing obtained after inflation of balloon with 1.25 - 1.5 cc. Complications: No apparent complications Patient did tolerate procedure well. Chest X-ray ordered to verify placement.  CXR: pending.   Clementeen Graham October 17, 2018   Erick Colace ACNP-BC [place] Pager # [phone] OR # [phone] if no answer

## 2018-10-15 NOTE — ED Provider Notes (Signed)
CXR was reviewed and ET tube in good position    Ripley Fraise, MD 10-24-2018 [phone]

## 2018-10-15 NOTE — Anesthesia Procedure Notes (Signed)
Procedure Name: Intubation Date/Time: 10-11-2018 1:16 AM Performed by: Delorise Hunkele D, CRNA Pre-anesthesia Checklist: Patient identified, Emergency Drugs available, Suction available and Patient being monitored Patient Re-evaluated:Patient Re-evaluated prior to induction Oxygen Delivery Method: Ambu bag Preoxygenation: Pre-oxygenation with 100% oxygen (AW with gross contamination of gastric contents) Induction Type: IV induction Ventilation: Mask ventilation without difficulty Laryngoscope Size: Glidescope and 4 Grade View: Grade I Tube type: Subglottic suction tube Tube size: 7.5 mm Number of attempts: 1 Airway Equipment and Method: Stylet and Oral airway Placement Confirmation: ETT inserted through vocal cords under direct vision,  positive ETCO2 and breath sounds checked- equal and bilateral Secured at: 22 cm Tube secured with: Tape Dental Injury: Teeth and Oropharynx as per pre-operative assessment

## 2018-10-15 NOTE — ED Provider Notes (Signed)
CPR Procedure Note I PERSONALLY DIRECTED ANCILLARY STAFF OR/PERFORMED CPR IN AN EFFORT TO REGAIN RETURN OF SPONTANEOUS CIRCULATION IN AN EFFORT TO MAINTAIN NEURO, CARDIAC AND SYSTEMIC PERFUSION  I was called to patient room in [place] [facility] 1422 due to cardiac arrest On arrival CPR was in progress, but pt soon regained spontaneous circulation Pt was intubated after my arrival by nurse anesthetist I have discussed case with ICU physician and pt will be transferred to ICU EKG and CXR pending at this time    Ripley Fraise, MD 10/29/2018 0119

## 2018-10-15 NOTE — Progress Notes (Signed)
Patient arrived to ICU 1238 at [place]. Transferred from floor bed to ICU bed. Rhythm on the monitor vfib. See code sheet for details. ROSC obtained. See flowsheets for vital signs and assessment. See MAR for titration of vasopressor drips.  Baltazar Najjar, NP at bedside and Elwyn Reach, MD at bedside. Aljishi, MD inserted CVC into right femoral vein. Order received for arterial line. Respiratory placed arterial line.   Decompensation throughout shift after ROSC. See notes and orders from [facility].   Ronnie son called around 0600. He was updated about his father's status and said he was on his way to the hospital. No decisions regarding code status were made.   Will continue to closely monitor.

## 2018-10-15 NOTE — Progress Notes (Signed)
PULMONARY / CRITICAL CARE MEDICINE   NAME:  Gregory [name], MRN:  [id number], DOB:  Apr 11, 1930, LOS: 2 ADMISSION DATE:  10/08/2018, CONSULTATION DATE: Oct 21, 2018 REFERRING MD: Hospitalist, CHIEF COMPLAINT: Cardiac arrest  BRIEF HISTORY:    85 year old male admitted with small bowel obstruction status post cardiac arrest on the floor during the early a.m. hours on 2/20  SIGNIFICANT PAST MEDICAL HISTORY   Coronary artery disease, peripheral vascular disease, history of gastritis, diastolic heart dysfunction.  Paroxysmal atrial fibrillation, pneumonia, ST elevation MI, prior stroke, remote prostate cancer, hypertension.   SIGNIFICANT EVENTS:  2/18: Admitted with a small bowel obstruction 2/19: Worsening delirium.  Intermittent agitation.  Getting occasional Ativan, not getting Haldol due to QTC concerns 2/20: Ventricular fibrillation arrest,Required 3 separate episodes of defibrillation.  Intubated, femoral line placed, transferred to the ICU.  In refractory shock requiring 3 pressors.  Heparin started.  Pulmonary artery catheter placed.  Hemodynamics suggesting septic shock physiology with mildly depressed cardiac output seemingly volume responsive.  Cardiac output increasing from 3 up to 3.6 following volume challenge.  Placed on ARDS protocol, neuromuscular blockade, and bicarbonate drip.  Goals of care discussed with family.  Son agreed to DO NOT RESUSCITATE status, however continuing aggressive medical therapies STUDIES:   CT abdomen 2/18: Consistent with small bowel obstruction 2/20: Echocardiogram>>>  CULTURES:  Blood culture 2/20>>> Sputum 2/20>>>  ANTIBIOTICS:  Zosyn 2/19>>> Vancomycin 2/19>>>  LINES/TUBES:   Right femoral line 10-21-2018 Endotracheal tube to 22,020 CONSULTANTS:  Cardiology Surgery SUBJECTIVE:  Now sedated, paralyzed, remains on multiple pressors  CONSTITUTIONAL: Blood Pressure (Abnormal) 76/34   Pulse 87   Temperature (Abnormal) 97.5 F (36.4 C)  (Axillary)   Respiration (Abnormal) 36   Height 5\' 11"  (1.803 m)   Weight 66.2 kg   Oxygen Saturation 97%   Body Mass Index 20.36 kg/m   I/O last 3 completed shifts: In: 5364.3 [P.O.:50; I.V.:4584.1; IV Piggyback:730.2] Out: 1700 [Urine:1100; Emesis/NG output:600]  CVP:  [14 mmHg] 14 mmHg  Vent Mode: PRVC FiO2 (%):  [100 %] 100 % Set Rate:  [18 bmp-28 bmp] 28 bmp Vt Set:  [520 mL-580 mL] 580 mL PEEP:  [5 cmH20-8 cmH20] 8 cmH20 Plateau Pressure:  [22 NWG95-62 cmH20] 27 cmH20  PHYSICAL EXAM: General: 85 year old male currently on full ventilatory support, and multiple vasoactive drips HEENT: Orally intubated, nasogastric tube in place with dark gastric fluid Pulmonary: Diffuse rhonchi, equal chest rise Cardiac: Atrial fibrillation with frequent ventricular beats Abdomen: Firm hypoactive Extremities: Warm dry strong pulses Neuro: Now heavily sedated and paralyzed GU: Concentrated urine minimal output  RESOLVED PROBLEM LIST   ASSESSMENT AND PLAN    Status post ventricular fibrillation arrest Troponins elevated consistent with demand ischemia -Has a significant history of coronary artery disease, ischemic heart disease, prior right bundle blanch block and grade 2 diastolic heart failure. Not clear if this was primary cardiac event or simply all due to demand ischemia in the setting of his acute illness; favoring demand ischemia Plan Continue telemetry monitoring  Continue IV heparin  Continue amiodarone infusion for now  Not a candidate for beta-blockade due to hypotension   Circulatory shock.  Motor dynamics suggesting septic shock/MODS;  -Currently on 3 pressors -Dobutamine, blood pressure seemed to respond to volume Plan Continue current antibiotics, day #2 vancomycin and Zosyn  Titrating pressors for mean arterial pressure greater than 65; will titrate phenylephrine first, followed by norepinephrine leaving vasopressin fixed.   We will make current CVP goal greater  than 18; he had significant improvement  in mean arterial pressure, cardiac output, and all hemodynamics when volume status moved from 12- 20 CVP continue stress dose steroids   Acute hypoxic respiratory failure status post arrest with diffuse pulmonary infiltrates, presumed aspiration pneumonia with evolving ARDS, cannot exclude element of pulmonary edema Portable chest x-ray personally reviewed.  This demonstrates endotracheal tube in satisfactory position there is right greater than left airspace disease appreciated. Plan ARDS protocol PAD, RASS goal -5 Neuromuscular blockade protocol x48 hours VAP bundle Daily x-ray PRN ABGs  Small bowel obstruction Plan Continue bowel rest with gastric decompression  Abdominal films daily  General surgery following, not a surgical candidate currently    Hypoglycemia Plan Continue stress dose steroids Continue D10  Acute kidney injury status post cardiac arrest.  Almost certainly this will continue to worsen given ongoing shock state; creatinine has gone from 0.84-1.94 over last 12 hours, he is oliguric Plan Continue to renal dose medications Holding antihypertensives Mean arterial pressure goal greater than 65 Strict intake output A.m. chemistry  Fluid and electrolyte imbalance/acid-base imbalance: Anion gap metabolic acidosis, hypokalemia. Plan Replacing potassium Supplemental bicarb for now Will need to watch arterial blood gases closely to avoid iatrogenic hyperventilation     Best Practice / Goals of Care / Disposition.   DVT PROPHYLAXIS: Currently on therapeutic heparin SUP: PPI NUTRITION: N.p.o. MOBILITY: Bedrest GOALS OF CARE: DO NOT RESUSCITATE but continue aggressive supportive measures FAMILY DISCUSSIONS: Daughter was updated at bedside DISPOSITION he is critically ill in refractory shock.  More dynamic suggesting primarily sepsis, also appears to be volume responsive with cardiac output improving when challenged from  CVP of 12 up to 20.  Currently I think we have maximized his oxygen delivery, and S CV O2 appears adequate.  He does have what is likely a significant lactic acidosis this may be also somewhat due to risk of gut ischemia.  We will continue supportive care I discussed CODE STATUS with his son.  He is now a DO NOT RESUSCITATE should he arrest with the current measures in place    my critical care time 60 minutes  Erick Colace ACNP-BC [place] Pager # [phone] OR # [phone] if no answer

## 2018-10-15 NOTE — Progress Notes (Signed)
  Echocardiogram 2D Echocardiogram has been performed.  Jannett Celestine 14-Oct-2018, 3:05 PM

## 2018-10-15 NOTE — Progress Notes (Signed)
Chaplain referred by RN due to pt nearing death.  Providing support with family on ICU.

## 2018-10-15 NOTE — Progress Notes (Addendum)
[facility] Progress Note Patient Name: Gregory [name] DOB: 1929-11-19 MRN: [id number]   Date of Service  23-Oct-2018  HPI/Events of Note  Camera; MAP 57. maxed on 3 pressor.  eICU Interventions  Poor prognosis. Cardiogenic and septic shock.  - albumin bolus stat Epi Inj stat once and start eli drip. High risk for bowel ischemia - start hydrocortisone 60 mg q 6 hr. - AIBP not possible , too unstable to transfer out. ARDS. 100% fio2. PH good. Metabolic acidosis stable.      Intervention Category Major Interventions: Shock - evaluation and management   Updated Dr Elwyn Reach about above plan and guarded condition.  Elmer Sow 2018/10/23, 5:24 AM

## 2018-10-15 NOTE — Progress Notes (Signed)
Subjective/Chief Complaint: Intubated, on multiple pressors   Objective: Vital signs in last 24 hours: Temp:  [97.7 F (36.5 C)-98.6 F (37 C)] 97.7 F (36.5 C) (02/20 0600) Pulse Rate:  [77-87] 87 (02/19 2059) Resp:  [18-40] 35 (02/20 0805) BP: (53-137)/(31-93) 76/34 (02/20 0800) SpO2:  [97 %-99 %] 97 % (02/19 2059) FiO2 (%):  [100 %] 100 % (02/20 0600) Last BM Date: 10/02/18  Intake/Output from previous day: 02/19 0701 - 02/20 0700 In: 4732.8 [I.V.:4052.6; IV Piggyback:680.2] Out: 1100 [Urine:1100] Intake/Output this shift: No intake/output data recorded.  Exam: Intubated Abdomen soft, no frank peritonitis  Lab Results:  Recent Labs    10/04/18 0053 10-09-2018 0214  WBC 5.1 4.8  HGB 12.1* 11.2*  HCT 38.6* 37.2*  PLT 199 162   BMET Recent Labs    10/04/18 0053 09-Oct-2018 0214  NA 137 149*  K 4.0 4.4  CL 105 106  CO2 25 28  GLUCOSE 90 34*  BUN 18 32*  CREATININE 0.84 1.50*  CALCIUM 8.5* 7.6*   PT/INR No results for input(s): LABPROT, INR in the last 72 hours. ABG Recent Labs    10/09/2018 0451  PHART 7.428  HCO3 19.9*    Studies/Results: Dg Abd 1 View  Result Date: 10/09/2018 CLINICAL DATA:  OG tube placement EXAM: ABDOMEN - 1 VIEW COMPARISON:  10/04/2018 FINDINGS: Cardiomegaly with consolidation at the left lung base. Slight leftward deviation of the distal esophageal tube course. Distally the tube is folded back upon itself with the tip directed cephalad over the distal esophageal region. Persistent dilatation of central small bowel loops. IMPRESSION: 1. Esophageal tube tip is folded back upon itself with the tip directed cephalad over the distal esophageal region. Repositioning is recommended 2. Cardiomegaly with interval consolidation at the left base. 3. Persistent small bowel dilatation Electronically Signed   By: Donavan Foil M.D.   On: October 09, 2018 02:43   Dg Abd 1 View  Result Date: 10/04/2018 CLINICAL DATA:  Nasogastric tube placement  EXAM: ABDOMEN - 1 VIEW COMPARISON:  Yesterday FINDINGS: Advanced nasogastric tube with tip at the descending duodenum. Continued small bowel distension with fluid. Gallstones. Prior pelvic dissection. IMPRESSION: 1. Advanced nasogastric tube with tip at the descending duodenum. 2. Small bowel obstruction. Electronically Signed   By: Monte Fantasia M.D.   On: 10/04/2018 04:12   Dg Abdomen 1 View  Result Date: 10/06/2018 CLINICAL DATA:  Nasogastric tube placement and small-bowel obstruction. EXAM: ABDOMEN - 1 VIEW COMPARISON:  Film earlier at 1223 hours FINDINGS: The nasogastric tube has now been straightened with the tip just barely below the diaphragm and likely at the GE junction. No significant change in small bowel dilatation primarily involving proximal small bowel loops. IMPRESSION: Nasogastric tube straightened with tip likely at the GE junction. The tube should be advanced at least 6 cm in order to position the side hole in the proximal stomach. Electronically Signed   By: Aletta Edouard M.D.   On: 09/19/2018 14:05   Dg Abdomen 1 View  Result Date: 09/20/2018 CLINICAL DATA:  Is gastric tube placement EXAM: ABDOMEN - 1 VIEW COMPARISON:  Portable exam 1223 hours compared to CT abdomen and pelvis of 09/30/2018 FINDINGS: Tip of nasogastric tube is coiled in the distal esophagus and reflected cranially. Dilated loops of small bowel with paucity of colonic gas/stool question small bowel obstruction. No bowel wall thickening. Bones demineralized with multilevel degenerative disc disease changes of the thoracolumbar spine. Excreted contrast material within bladder. IMPRESSION: Question small bowel obstruction.  Nasogastric tube coiled in the distal esophagus with tip directed cranially; recommend withdrawal/replacement or repositioning. Findings called to [facility] in ED on 09/19/2018 at 1306 hours. Electronically Signed   By: Lavonia Dana M.D.   On: 10/08/2018 13:07   Ct Abdomen Pelvis W Contrast  Result  Date: 09/28/2018 CLINICAL DATA:  Nausea, vomiting, lower abdominal pain EXAM: CT ABDOMEN AND PELVIS WITH CONTRAST TECHNIQUE: Multidetector CT imaging of the abdomen and pelvis was performed using the standard protocol following bolus administration of intravenous contrast. CONTRAST:  153mL ISOVUE-300 IOPAMIDOL (ISOVUE-300) INJECTION 61% COMPARISON:  08/05/2016 FINDINGS: Lower chest: Coronary artery calcifications. Hepatobiliary: No focal liver abnormality is seen. Gallstones. No gallbladder wall thickening, or biliary dilatation. Pancreas: Unremarkable. No pancreatic ductal dilatation or surrounding inflammatory changes. Spleen: Normal in size without focal abnormality. Adrenals/Urinary Tract: Adrenal glands are unremarkable. Kidneys are normal, without renal calculi, focal lesion, or hydronephrosis. Bladder is unremarkable. Stomach/Bowel: Stomach is within normal limits. The small bowel is diffusely distended with air and fluid levels. There is an anastomosis of the distal small bowel, with a focal, tight transition point in the mid abdomen just proximal to the anastomosis (series 2, image 45). Vascular/Lymphatic: No significant vascular findings are present. No enlarged abdominal or pelvic lymph nodes. Reproductive: Postoperative findings of prostatectomy with numerous surgical clips about the pelvis. Other: No abdominal wall hernia or abnormality. Small volume ascites. Musculoskeletal: No acute or significant osseous findings. IMPRESSION: 1. The small bowel is diffusely distended with air and fluid levels. There is an anastomosis of the distal small bowel, new compared to prior examination dated 08/05/2016, with a focal, tight transition point in the mid abdomen just proximal to the anastomosis (series 2, image 45). There is scattered gas and stool in the generally decompressed colon. Findings are concerning for small bowel obstruction. 2. Small volume ascites, non-specific although likely reactive in the setting  of bowel obstruction. 3. Chronic, incidental, and postoperative findings as detailed above. Electronically Signed   By: Eddie Candle M.D.   On: 09/29/2018 10:44   Dg Chest Port 1 View  Result Date: 10/29/18 CLINICAL DATA:  Cardiac arrest EXAM: PORTABLE CHEST 1 VIEW COMPARISON:  07/29/2018 FINDINGS: Moderate cardiomegaly with mild interstitial edema. Endotracheal tube tip is at the level of the clavicular heads. No focal airspace consolidation. IMPRESSION: 1. Endotracheal tube tip at the level of the clavicular heads. 2. Moderate cardiomegaly with mild interstitial edema. Electronically Signed   By: Ulyses Jarred M.D.   On: 2018/10/29 02:25   Dg Abd Portable 1v  Result Date: 10-29-2018 CLINICAL DATA:  Evaluate orogastric tube EXAM: PORTABLE ABDOMEN - 1 VIEW COMPARISON:  Earlier today FINDINGS: Straightening of the orogastric tube with tip over the proximal stomach and side-port over the lower esophagus. Gas and fluid-filled bowel. Opacity at both lung bases. IMPRESSION: 1. Interval straightening of the orogastric tube with tip over the proximal stomach and side-port over the lower esophagus. 2. Small bowel obstruction. 3. Airspace disease at both bases. Electronically Signed   By: Monte Fantasia M.D.   On: 2018-10-29 05:17   Dg Abd Portable 1v-small Bowel Obstruction Protocol-initial, 8 Hr Delay  Result Date: 10/04/2018 CLINICAL DATA:  8 hour delay small bowel protocol EXAM: PORTABLE ABDOMEN - 1 VIEW COMPARISON:  10/04/2018, 10/08/2018 FINDINGS: Retained contrast within the gastric fundus. Persistent dilatation of small bowel loops, measuring up to 4.5 cm. Dilute contrast present within dilated small bowel. No definitive contrast within the colon. There may be residual contrast in the bladder. IMPRESSION:  Persistent dilatation of small bowel loops now containing dilute contrast. No definite contrast identified in the colon. Electronically Signed   By: Donavan Foil M.D.   On: 10/04/2018 20:08     Anti-infectives: Anti-infectives (From admission, onward)   Start     Dose/Rate Route Frequency Ordered Stop   10/06/18 1800  vancomycin (VANCOCIN) 1,250 mg in sodium chloride 0.9 % 250 mL IVPB     1,250 mg 166.7 mL/hr over 90 Minutes Intravenous Every 36 hours 28-Oct-2018 0333     2018-10-28 1000  piperacillin-tazobactam (ZOSYN) IVPB 3.375 g     3.375 g 12.5 mL/hr over 240 Minutes Intravenous Every 8 hours 2018-10-28 0333     October 28, 2018 0300  piperacillin-tazobactam (ZOSYN) IVPB 3.375 g     3.375 g 100 mL/hr over 30 Minutes Intravenous NOW 10-28-18 0241 10-28-18 0345   10-28-18 0300  vancomycin (VANCOCIN) 1,500 mg in sodium chloride 0.9 % 500 mL IVPB     1,500 mg 250 mL/hr over 120 Minutes Intravenous  Once 2018-10-28 0241 10-28-18 0542      Assessment/Plan: Remains critically ill on multiple pressors s/p probable cardiac event vs aspiration  Plans per CCM, cards, etc. No plans for laparotomy at this point Continue NG  LOS: 2 days    Coralie Keens 10-28-2018

## 2018-10-15 NOTE — Procedures (Signed)
Central Venous Catheter Insertion Procedure Note Gregory [name] [id number] 12-07-1929  Procedure: Insertion of Central Venous Catheter Indications: Drug and/or fluid administration  Procedure Details Consent: Unable to obtain consent because of altered level of consciousness. Time Out: Verified patient identification, verified procedure, site/side was marked, verified correct patient position, special equipment/implants available, medications/allergies/relevent history reviewed, required imaging and test results available.  Performed  Maximum sterile technique was used including antiseptics, cap, gloves, gown, hand hygiene, mask and sheet. Skin prep: Chlorhexidine; local anesthetic administered A antimicrobial bonded/coated triple lumen catheter was placed in the right femoral vein due to emergent situation using the Seldinger technique.  Evaluation Blood flow good Complications: No apparent complications Patient did tolerate procedure well.  Gregory [name] November 01, 2018, 2:37 AM

## 2018-10-15 NOTE — Progress Notes (Signed)
Spoke with son at 13 PM  I encouraged him or the family members you may want to come in to come visit Gregory [name] as he continues to deteriorate despite maximal efforts at stabilizing him  Efforts currently appear to be futile his realistic chances of recovery is very grim Were not able to stabilize his blood pressure despite our optimal aggressive efforts  Patient is currently DNR status I did let him know that I do not think he will  Survive for much longer

## 2018-10-15 NOTE — Death Summary Note (Signed)
DEATH SUMMARY   Patient Details  Name: Gregory [name] MRN: [id number] DOB: Mar 01, 1930  Admission/Discharge Information   Admit Date:  10/27/18  Date of Death:    Time of Death:    Length of Stay: 2  Referring Physician: Janie Morning, DO   Reason(s) for Hospitalization  Small bowel obstruction  Diagnoses  Preliminary cause of death: Shock [facility]) Secondary Diagnoses (including complications and co-morbidities):  Active Problems:   SBO (small bowel obstruction) (HCC)   Cardiac arrest following intubation (HCC)   Cardiac arrest ([place])   Cardiogenic shock ([place])   Septic shock [facility])   Brief Hospital Course (including significant findings, care, treatment, and services provided and events leading to death)  Gregory [name] is a 85 y.o. year old male who was admitted to the hospital on 10-27-2018 with complaints of abdominal pain which he has been having for about 3 weeks prior to presenting to the hospital, presented following worsening abdominal pain and vomiting. Last bowel movement was 2 days prior to presentation Working diagnosis of small bowel obstruction Patient noted to be having episodes of waxing and waning mental status, delirium-which was being managed medically At about 0100 hrs. on 10-29-2018-patient was noted to have agonal breathing and a code was called Patient was successfully resuscitated, transferred to the intensive care unit Patient had couple of ventricular fibrillation and had multiple shocks Did have another episode of a shockable rhythm Progressive deterioration led to patient on maximal doses of Levophed, phenylephrine and vasopressin Status continued to deteriorate, discussions had with family, patient was made DO NOT RESUSCITATE There was concern for aspiration leading to sepsis versus a cardiac event Swan-Ganz catheter was floated with parameters suggesting sepsis  Despite aggressive intervention patient continued to deteriorate Family was notified  about his progressive deterioration  Patient finally succumbed at 85 on 2018-10-29  Pronounced dead at [place] 10-29-18    Pertinent Labs and Studies  Significant Diagnostic Studies Dg Abd 1 View  Result Date: October 29, 2018 CLINICAL DATA:  OG tube placement EXAM: ABDOMEN - 1 VIEW COMPARISON:  10/04/2018 FINDINGS: Cardiomegaly with consolidation at the left lung base. Slight leftward deviation of the distal esophageal tube course. Distally the tube is folded back upon itself with the tip directed cephalad over the distal esophageal region. Persistent dilatation of central small bowel loops. IMPRESSION: 1. Esophageal tube tip is folded back upon itself with the tip directed cephalad over the distal esophageal region. Repositioning is recommended 2. Cardiomegaly with interval consolidation at the left base. 3. Persistent small bowel dilatation Electronically Signed   By: Donavan Foil M.D.   On: Oct 29, 2018 02:43   Dg Abd 1 View  Result Date: 10/04/2018 CLINICAL DATA:  Nasogastric tube placement EXAM: ABDOMEN - 1 VIEW COMPARISON:  Yesterday FINDINGS: Advanced nasogastric tube with tip at the descending duodenum. Continued small bowel distension with fluid. Gallstones. Prior pelvic dissection. IMPRESSION: 1. Advanced nasogastric tube with tip at the descending duodenum. 2. Small bowel obstruction. Electronically Signed   By: Monte Fantasia M.D.   On: 10/04/2018 04:12   Dg Abdomen 1 View  Result Date: 10-27-18 CLINICAL DATA:  Nasogastric tube placement and small-bowel obstruction. EXAM: ABDOMEN - 1 VIEW COMPARISON:  Film earlier at 1223 hours FINDINGS: The nasogastric tube has now been straightened with the tip just barely below the diaphragm and likely at the GE junction. No significant change in small bowel dilatation primarily involving proximal small bowel loops. IMPRESSION: Nasogastric tube straightened with tip likely at the GE junction. The tube  should be advanced at least 6 cm in order to  position the side hole in the proximal stomach. Electronically Signed   By: Aletta Edouard M.D.   On: 09/16/2018 14:05   Dg Abdomen 1 View  Result Date: 09/16/2018 CLINICAL DATA:  Is gastric tube placement EXAM: ABDOMEN - 1 VIEW COMPARISON:  Portable exam 1223 hours compared to CT abdomen and pelvis of 09/16/2018 FINDINGS: Tip of nasogastric tube is coiled in the distal esophagus and reflected cranially. Dilated loops of small bowel with paucity of colonic gas/stool question small bowel obstruction. No bowel wall thickening. Bones demineralized with multilevel degenerative disc disease changes of the thoracolumbar spine. Excreted contrast material within bladder. IMPRESSION: Question small bowel obstruction. Nasogastric tube coiled in the distal esophagus with tip directed cranially; recommend withdrawal/replacement or repositioning. Findings called to [name] in ED on 10/02/2018 at 1306 hours. Electronically Signed   By: Lavonia Dana M.D.   On: 09/22/2018 13:07   Ct Abdomen Pelvis W Contrast  Result Date: 10/10/2018 CLINICAL DATA:  Nausea, vomiting, lower abdominal pain EXAM: CT ABDOMEN AND PELVIS WITH CONTRAST TECHNIQUE: Multidetector CT imaging of the abdomen and pelvis was performed using the standard protocol following bolus administration of intravenous contrast. CONTRAST:  162mL ISOVUE-300 IOPAMIDOL (ISOVUE-300) INJECTION 61% COMPARISON:  08/05/2016 FINDINGS: Lower chest: Coronary artery calcifications. Hepatobiliary: No focal liver abnormality is seen. Gallstones. No gallbladder wall thickening, or biliary dilatation. Pancreas: Unremarkable. No pancreatic ductal dilatation or surrounding inflammatory changes. Spleen: Normal in size without focal abnormality. Adrenals/Urinary Tract: Adrenal glands are unremarkable. Kidneys are normal, without renal calculi, focal lesion, or hydronephrosis. Bladder is unremarkable. Stomach/Bowel: Stomach is within normal limits. The small bowel is diffusely distended  with air and fluid levels. There is an anastomosis of the distal small bowel, with a focal, tight transition point in the mid abdomen just proximal to the anastomosis (series 2, image 45). Vascular/Lymphatic: No significant vascular findings are present. No enlarged abdominal or pelvic lymph nodes. Reproductive: Postoperative findings of prostatectomy with numerous surgical clips about the pelvis. Other: No abdominal wall hernia or abnormality. Small volume ascites. Musculoskeletal: No acute or significant osseous findings. IMPRESSION: 1. The small bowel is diffusely distended with air and fluid levels. There is an anastomosis of the distal small bowel, new compared to prior examination dated 08/05/2016, with a focal, tight transition point in the mid abdomen just proximal to the anastomosis (series 2, image 45). There is scattered gas and stool in the generally decompressed colon. Findings are concerning for small bowel obstruction. 2. Small volume ascites, non-specific although likely reactive in the setting of bowel obstruction. 3. Chronic, incidental, and postoperative findings as detailed above. Electronically Signed   By: Eddie Candle M.D.   On: 09/28/2018 10:44   Dg Chest Port 1 View  Result Date: October 16, 2018 CLINICAL DATA:  Status post Swan-Ganz catheter placement. EXAM: PORTABLE CHEST 1 VIEW COMPARISON:  Radiograph of same day. FINDINGS: Stable cardiomegaly. Endotracheal and nasogastric tubes are in grossly good position. Interval placement of right internal jugular Swan-Ganz catheter with tip directed into right pulmonary artery. Increased right perihilar and basilar opacity is noted concerning for edema or pneumonia. Mild left basilar atelectasis is noted. No pneumothorax is noted. Bony thorax is unremarkable. IMPRESSION: Endotracheal and nasogastric tubes are in grossly good position. Interval placement of right internal jugular Swan-Ganz catheter with tip directed into right pulmonary artery.  Increased right perihilar and basilar opacity is noted concerning for worsening edema or pneumonia. Electronically Signed   By:  Marijo Conception, M.D.   On: 13-Oct-2018 11:48   Dg Chest Port 1 View  Result Date: Oct 13, 2018 CLINICAL DATA:  Cardiac arrest EXAM: PORTABLE CHEST 1 VIEW COMPARISON:  07/29/2018 FINDINGS: Moderate cardiomegaly with mild interstitial edema. Endotracheal tube tip is at the level of the clavicular heads. No focal airspace consolidation. IMPRESSION: 1. Endotracheal tube tip at the level of the clavicular heads. 2. Moderate cardiomegaly with mild interstitial edema. Electronically Signed   By: Ulyses Jarred M.D.   On: 2018/10/13 02:25   Dg Abd Portable 1v  Result Date: 10-13-2018 CLINICAL DATA:  Evaluate orogastric tube EXAM: PORTABLE ABDOMEN - 1 VIEW COMPARISON:  Earlier today FINDINGS: Straightening of the orogastric tube with tip over the proximal stomach and side-port over the lower esophagus. Gas and fluid-filled bowel. Opacity at both lung bases. IMPRESSION: 1. Interval straightening of the orogastric tube with tip over the proximal stomach and side-port over the lower esophagus. 2. Small bowel obstruction. 3. Airspace disease at both bases. Electronically Signed   By: Monte Fantasia M.D.   On: 10/13/2018 05:17   Dg Abd Portable 1v-small Bowel Obstruction Protocol-initial, 8 Hr Delay  Result Date: 10/04/2018 CLINICAL DATA:  8 hour delay small bowel protocol EXAM: PORTABLE ABDOMEN - 1 VIEW COMPARISON:  10/04/2018, 10/07/2018 FINDINGS: Retained contrast within the gastric fundus. Persistent dilatation of small bowel loops, measuring up to 4.5 cm. Dilute contrast present within dilated small bowel. No definitive contrast within the colon. There may be residual contrast in the bladder. IMPRESSION: Persistent dilatation of small bowel loops now containing dilute contrast. No definite contrast identified in the colon. Electronically Signed   By: Donavan Foil M.D.   On: 10/04/2018  20:08   Vas [place] Abi With/wo Tbi  Result Date: 09/12/2018 LOWER EXTREMITY DOPPLER STUDY Indications: In 08/2017, an arterial Doppler showed an ABI of .90 on the right              and 1.15 on the left. Patient denies any claudication symptoms or              rest pain. He does have essentially stable swelling in both legs x              1 year and occasional pain that starts at the top of the left foot              and "shoots" up towards the knee. High Risk Factors: Hypertension, hyperlipidemia, no history of smoking, prior                    MI, coronary artery disease, prior CVA.  Vascular Interventions: [facility] one directional atherectomy followed by drug                         eluding balloon angioplasty mid left SFA on 01/24/2017. Performing Technologist: Sharlett Iles RVT  Examination Guidelines: A complete evaluation includes at minimum, Doppler waveform signals and systolic blood pressure reading at the level of bilateral brachial, anterior tibial, and posterior tibial arteries, when vessel segments are accessible. Bilateral testing is considered an integral part of a complete examination. Photoelectric Plethysmograph (PPG) waveforms and toe systolic pressure readings are included as required and additional duplex testing as needed. Limited examinations for reoccurring indications may be performed as noted.  ABI Findings: +---------+------------------+-----+-----------+--------+ Right    Rt Pressure (mmHg)IndexWaveform   Comment  +---------+------------------+-----+-----------+--------+ Brachial 149                                        +---------+------------------+-----+-----------+--------+  ATA      141               0.95 multiphasic         +---------+------------------+-----+-----------+--------+ PTA      129               0.87 multiphasic         +---------+------------------+-----+-----------+--------+ PERO     128               0.86 multiphasic          +---------+------------------+-----+-----------+--------+ Great Toe85                0.57 Abnormal            +---------+------------------+-----+-----------+--------+ +---------+------------------+-----+-----------+-------+ Left     Lt Pressure (mmHg)IndexWaveform   Comment +---------+------------------+-----+-----------+-------+ Brachial 148                                       +---------+------------------+-----+-----------+-------+ ATA      172               1.15 multiphasic        +---------+------------------+-----+-----------+-------+ PTA      144               0.97 multiphasic        +---------+------------------+-----+-----------+-------+ PERO     161               1.08 multiphasic        +---------+------------------+-----+-----------+-------+ Great Toe84                0.56 Abnormal           +---------+------------------+-----+-----------+-------+ +-------+-----------+-----------+------------+------------+ ABI/TBIToday's ABIToday's TBIPrevious ABIPrevious TBI +-------+-----------+-----------+------------+------------+ Right  .95        .57        .90         .46          +-------+-----------+-----------+------------+------------+ Left   1.15       .56        1.15        .52          +-------+-----------+-----------+------------+------------+ Right ABIs appear increased compared to prior study on 09/12/2017. Left ABIs appear essentially unchanged compared to prior study on 09/12/2017.  Summary: Right: Resting right ankle-brachial index is within normal, low end range. No evidence of significant right lower extremity arterial disease. The right toe-brachial index is abnormal. Left: Resting left ankle-brachial index is within normal range. No evidence of significant left lower extremity arterial disease. The left toe-brachial index is abnormal. *See table(s) above for measurements and observations. Suggest follow up study in 12 months.  Electronically signed by Jenkins Rouge MD on 09/12/2018 at 2:37:14 PM.    Final    Vas [place] Lower Extremity Arterial Duplex  Result Date: 09/12/2018 LOWER EXTREMITY ARTERIAL DUPLEX STUDY Indications: In 08/2017, a lower arterial duplex showed a patent left SFA              without restenosis, s/p PTA and atherectomy. Patient denies any              claudication symptoms or rest pain. He does have essentially stable              swelling in both legs x 1 year and occasional pain that starts at  the top of the left foot and "shoots" up towards the knee. High Risk Factors: Hypertension, hyperlipidemia, no history of smoking, prior                    MI, coronary artery disease, prior CVA.  Vascular Interventions: [facility] one directional atherectomy followed by drug                         eluding balloon angioplasty mid left SFA on 01/24/2017. Current ABI:            .95 on the right and 1.15 on the left. Performing Technologist: Sharlett Iles RVT  Examination Guidelines: A complete evaluation includes B-mode imaging, spectral Doppler, color Doppler, and power Doppler as needed of all accessible portions of each vessel. Bilateral testing is considered an integral part of a complete examination. Limited examinations for reoccurring indications may be performed as noted.  Left Duplex Findings: +----------+--------+-----+---------------+---------+---------+           PSV cm/sRatioStenosis       Waveform Comments  +----------+--------+-----+---------------+---------+---------+ CFA Prox  176                         triphasic          +----------+--------+-----+---------------+---------+---------+ CFA Mid   234          50-74% stenosistriphasic          +----------+--------+-----+---------------+---------+---------+ CFA [id number]                         triphasic          +----------+--------+-----+---------------+---------+---------+ DFA       187                          triphasicturbulent +----------+--------+-----+---------------+---------+---------+ SFA Prox  137                         triphasic          +----------+--------+-----+---------------+---------+---------+ SFA Mid   82                          biphasic           +----------+--------+-----+---------------+---------+---------+ SFA [id number]                         biphasic           +----------+--------+-----+---------------+---------+---------+ POP Prox  75                          triphasic          +----------+--------+-----+---------------+---------+---------+ POP Mid   97                          triphasic          +----------+--------+-----+---------------+---------+---------+ POP Distal107                         triphasic          +----------+--------+-----+---------------+---------+---------+ Heterogenous plaque throughout with elevated velocities in the mid and distal CFA. The left SFA is patent without restenosis.  Summary: Left: Stable 50-74% stenosis noted in the common femoral artery. Patent left SFA without restenosis, s/p  PTA and atherectomy. No significant change as compared to previous study.  See table(s) above for measurements and observations. Suggest follow up study in 12 months. Electronically signed by Jenkins Rouge MD on 09/12/2018 at 4:53:46 PM.    Final     Microbiology No results found for this or any previous visit (from the past 240 hour(s)).  Lab Basic Metabolic Panel: Recent Labs  Lab 10/11/2018 0805 10/04/18 0053 10-16-18 0214 16-Oct-2018 1259  NA 136 137 149* 141  K 3.6 4.0 4.4 2.7*  CL 103 105 106 111  CO2 23 25 28  14*  GLUCOSE 103* 90 34* 189*  BUN 21 18 32* 40*  CREATININE 0.92 0.84 1.50* 1.94*  CALCIUM 9.0 8.5* 7.6* 6.6*  MG  --   --   --  1.8  PHOS  --   --   --  4.2   Liver Function Tests: Recent Labs  Lab 10/02/2018 0805 10/04/18 0053  AST 34 28  ALT 17 14  ALKPHOS 102 82  BILITOT 1.1 1.3*  PROT 7.5 6.2*   ALBUMIN 3.6 3.0*   Recent Labs  Lab 10/02/2018 0805  LIPASE 32   No results for input(s): AMMONIA in the last 168 hours. CBC: Recent Labs  Lab 10/12/2018 0805 10/04/18 0053 October 16, 2018 0214  WBC 11.1* 5.1 4.8  HGB 12.9* 12.1* 11.2*  HCT 40.3 38.6* 37.2*  MCV 87.6 88.7 91.6  PLT 200 199 162   Cardiac Enzymes: Recent Labs  Lab 09/21/2018 0805 10/08/2018 1414 10/02/2018 1854 10/04/18 0053 10-16-2018 0449  TROPONINI 0.07* 0.06* 0.07* 0.06* 6.77*   Sepsis Labs: Recent Labs  Lab 09/26/2018 0805 09/21/2018 0935 10/04/18 0053 October 16, 2018 0214  WBC 11.1*  --  5.1 4.8  LATICACIDVEN  --  1.2  --   --     Procedures/Operations  Central venous catheter placement 10-16-2018 Endotracheal intubation October 16, 2018 Pulmonary artery catheter insertion 10-16-18   Adewale A Olalere October 16, 2018, 5:53 PM

## 2018-10-15 NOTE — Progress Notes (Signed)
Responded to [facility] [employer]. Pt has 2 working PIV sites. RN to consult if additional access needed with transfer to CCU

## 2018-10-15 NOTE — Progress Notes (Signed)
Pharmacy Antibiotic Note  Gregory [name] is a 85 y.o. male admitted on 09/27/2018 with pneumonia.  Pharmacy has been consulted for vanc/zosyn dosing.   Since start of Vanc/Zosyn early this AM, SCr has increased from 0.84 to 1.5  Plan:  Change vanc from 1250mg  IV q36 to 1500mg  IV q48 - goal AUC 400-550  Continue same Zosyn dosing for now  Daily SCr - follow closely for further dosing changes  Height: 5\' 11"  (180.3 cm) Weight: 145 lb 15.1 oz (66.2 kg) IBW/kg (Calculated) : 75.3  Temp (24hrs), Avg:97.8 F (36.6 C), Min:97.5 F (36.4 C), Max:98.2 F (36.8 C)  Recent Labs  Lab 09/16/2018 0805 09/23/2018 0935 10/04/18 0053 2018/11/01 0214  WBC 11.1*  --  5.1 4.8  CREATININE 0.92  --  0.84 1.50*  LATICACIDVEN  --  1.2  --   --     Estimated Creatinine Clearance: 33.1 mL/min (A) (by C-G formula based on SCr of 1.5 mg/dL (H)).    Allergies  Allergen Reactions  . Sulfa Antibiotics Rash  . Ramipril     unknown    Thank you for allowing pharmacy to be a part of this patient's care.  Kara Mead 11-01-2018 1:23 PM

## 2018-10-15 NOTE — Progress Notes (Signed)
Pt continually declining despite maximum efforts to support. MD made aware and notified family of impending death. Family present at bedside as BP declined. Asystole on monitor. 2 RNs to bedside to auscultate for heart sounds. No sounds heard. Time of death called at 1645. Pt removed from ventilator. Pt family at bedside and grieving appropriately. Will continue to provide support.

## 2018-10-15 NOTE — ED Provider Notes (Signed)
Just prior to transfer to ICU, pt went into cardiac arrest again He received CPR/epinephrine/bicarb and regained pulses Levophed drip ordered EKG is abnormal post arrest, I d/w on call cardiology fellow dr Johnna Acosta, however at this point he would not go immediately to cardiac cath lab   CPR Procedure Note I PERSONALLY DIRECTED ANCILLARY STAFF OR/PERFORMED CPR IN AN EFFORT TO REGAIN RETURN OF SPONTANEOUS CIRCULATION IN AN EFFORT TO [name], CARDIAC AND SYSTEMIC PERFUSION    Ripley Fraise, MD Oct 12, 2018 [phone]

## 2018-10-15 DEATH — deceased

## 2018-12-03 IMAGING — CR DG CHEST 1V PORT
1 series · 1 of 1 positions shown · non-contrast
Comparison: CT chest 03/27/2016

CLINICAL DATA: Peripheral effusion, shortness of breath

EXAM:
PORTABLE CHEST 1 VIEW

[AP]
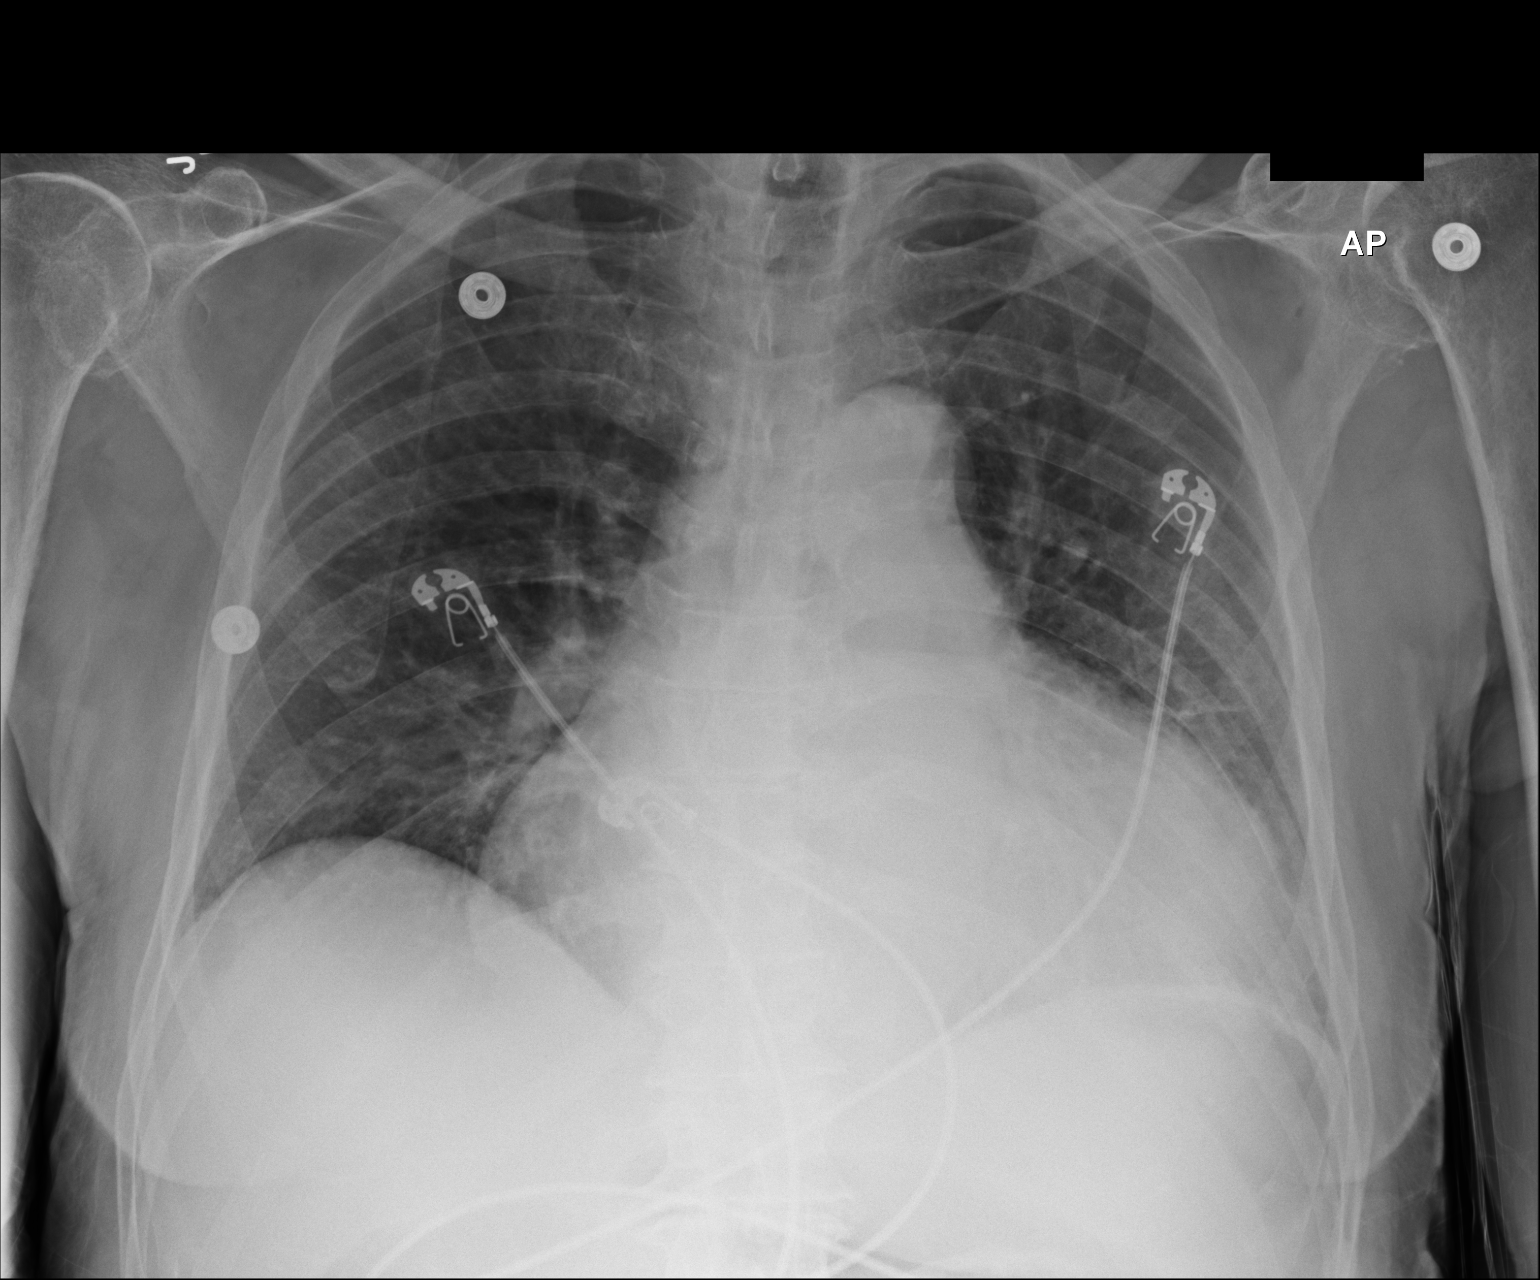

[1 of 1 positions shown; findings below may reference images not displayed]

FINDINGS: There is no focal parenchymal opacity. There is no pleural effusion
or pneumothorax. The cardiac silhouette is enlarged likely
reflecting a pericardial effusion given the prior exams.

The osseous structures are unremarkable.
IMPRESSION: 1. Enlarged cardiac silhouette likely reflecting a pericardial
effusion.

## 2018-12-06 IMAGING — CR DG CHEST 1V PORT
1 series · 1 of 1 positions shown · non-contrast
Comparison: Single-view of the chest 08/05/2016. Scratch the single
view of the chest 08/05/2016 and 03/26/2016.

CLINICAL DATA: Status post removal of pericardial drain.

EXAM:
PORTABLE CHEST 1 VIEW

[AP]
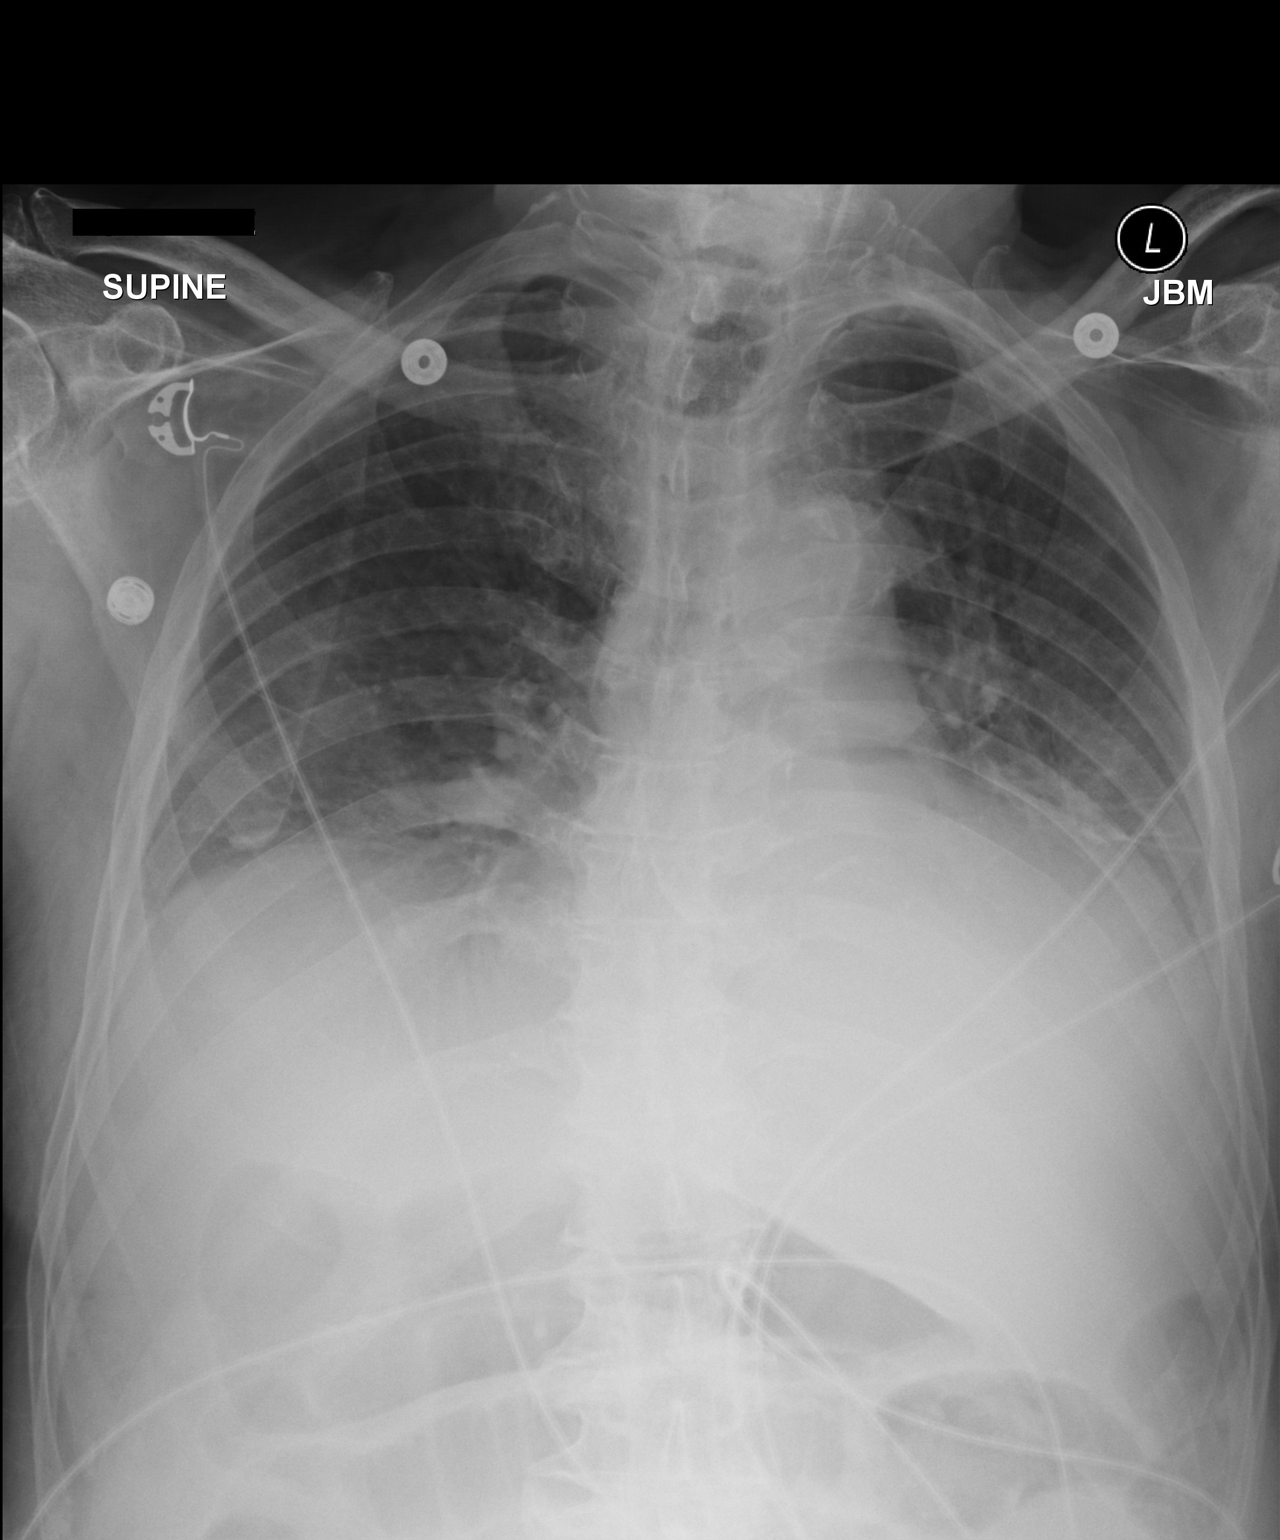

[1 of 1 positions shown; findings below may reference images not displayed]

FINDINGS: Cardiopericardial silhouette appears enlarged but smaller than on
the most recent exam. Left pleural effusion and basilar airspace
disease are noted. The right lung appears clear. No pneumothorax.
Aortic atherosclerosis is noted.
IMPRESSION: Left pleural effusion and basilar airspace disease, likely
atelectasis.

Enlarged cardiopericardial silhouette appears smaller than on the
most recent exam.

Atherosclerosis.

## 2018-12-11 IMAGING — DX DG FOOT COMPLETE 3+V*L*
3 series · 3 of 3 positions shown · non-contrast
Comparison: None.

CLINICAL DATA: Pain all over left foot.  No known injury.

EXAM:
LEFT FOOT - COMPLETE 3+ VIEW

[foot ap]
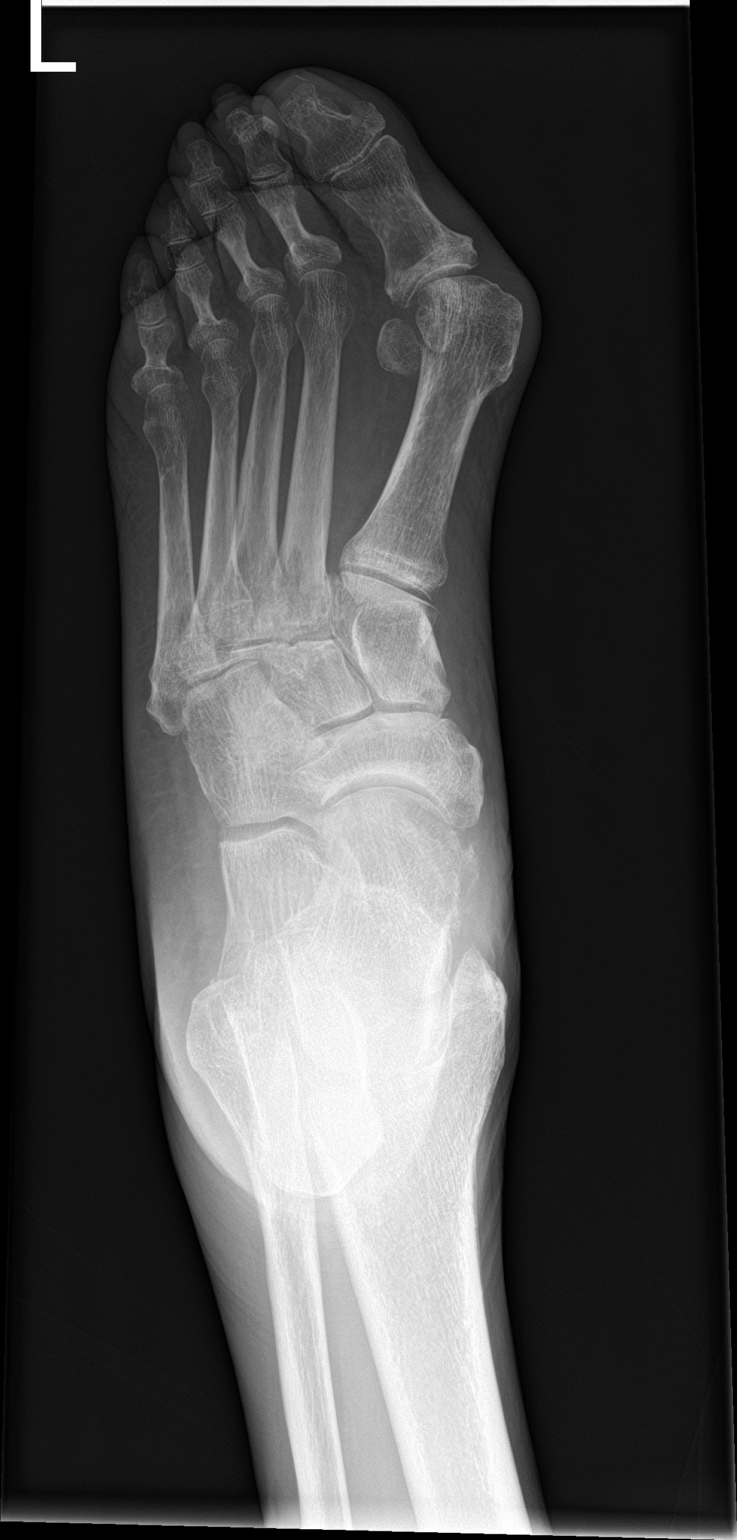

[foot obl]
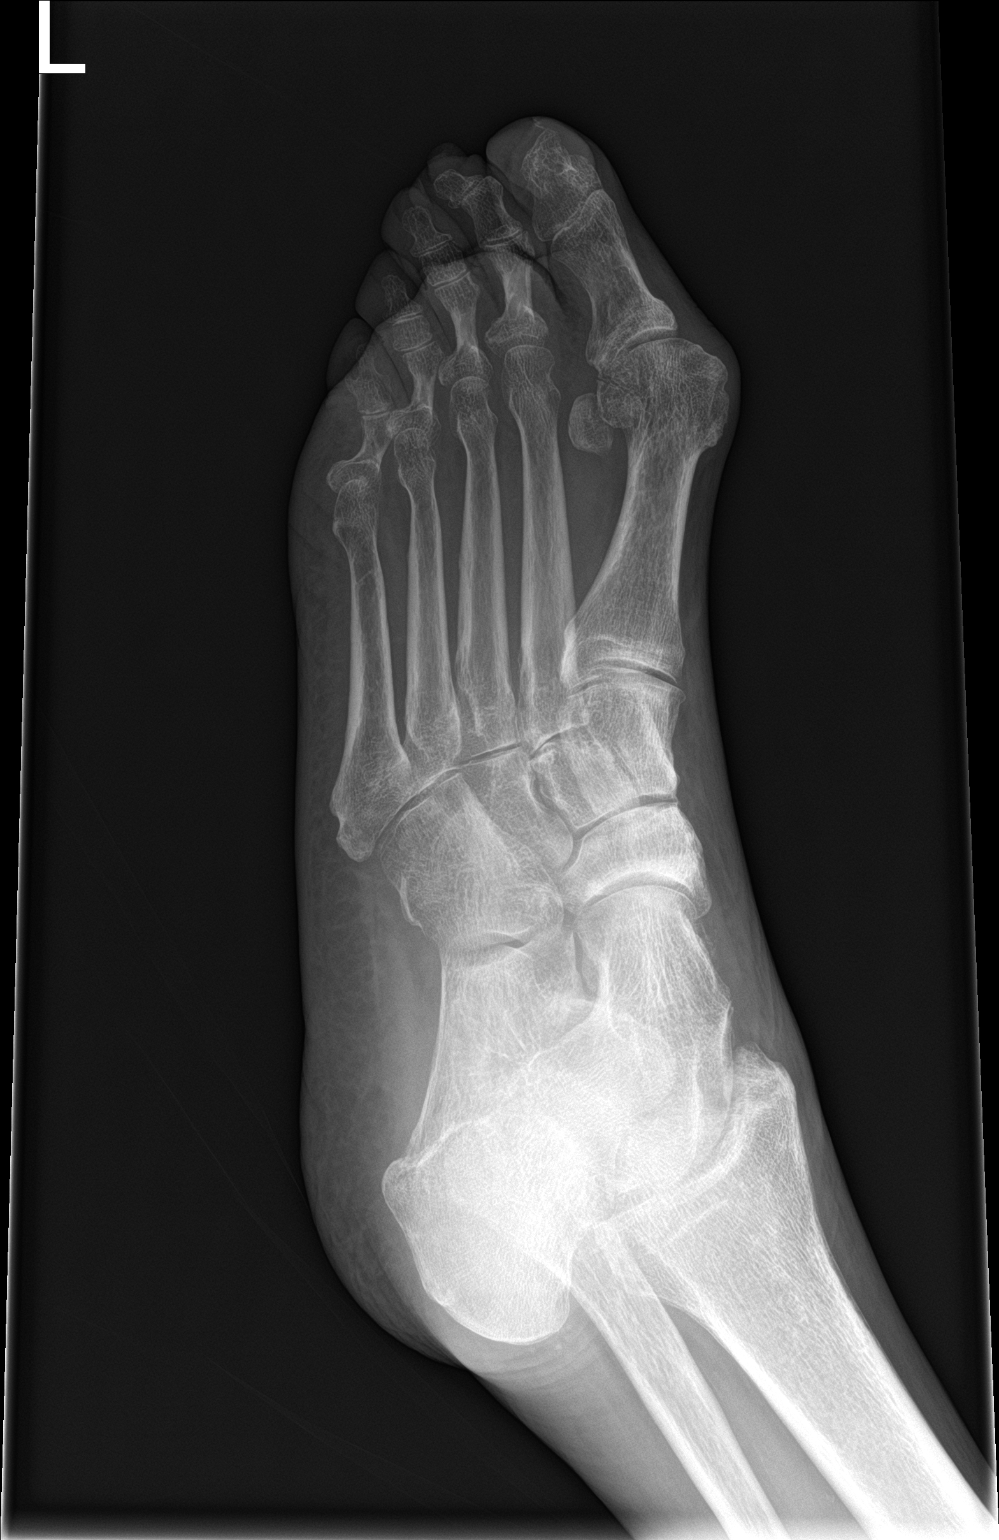

[foot lat]
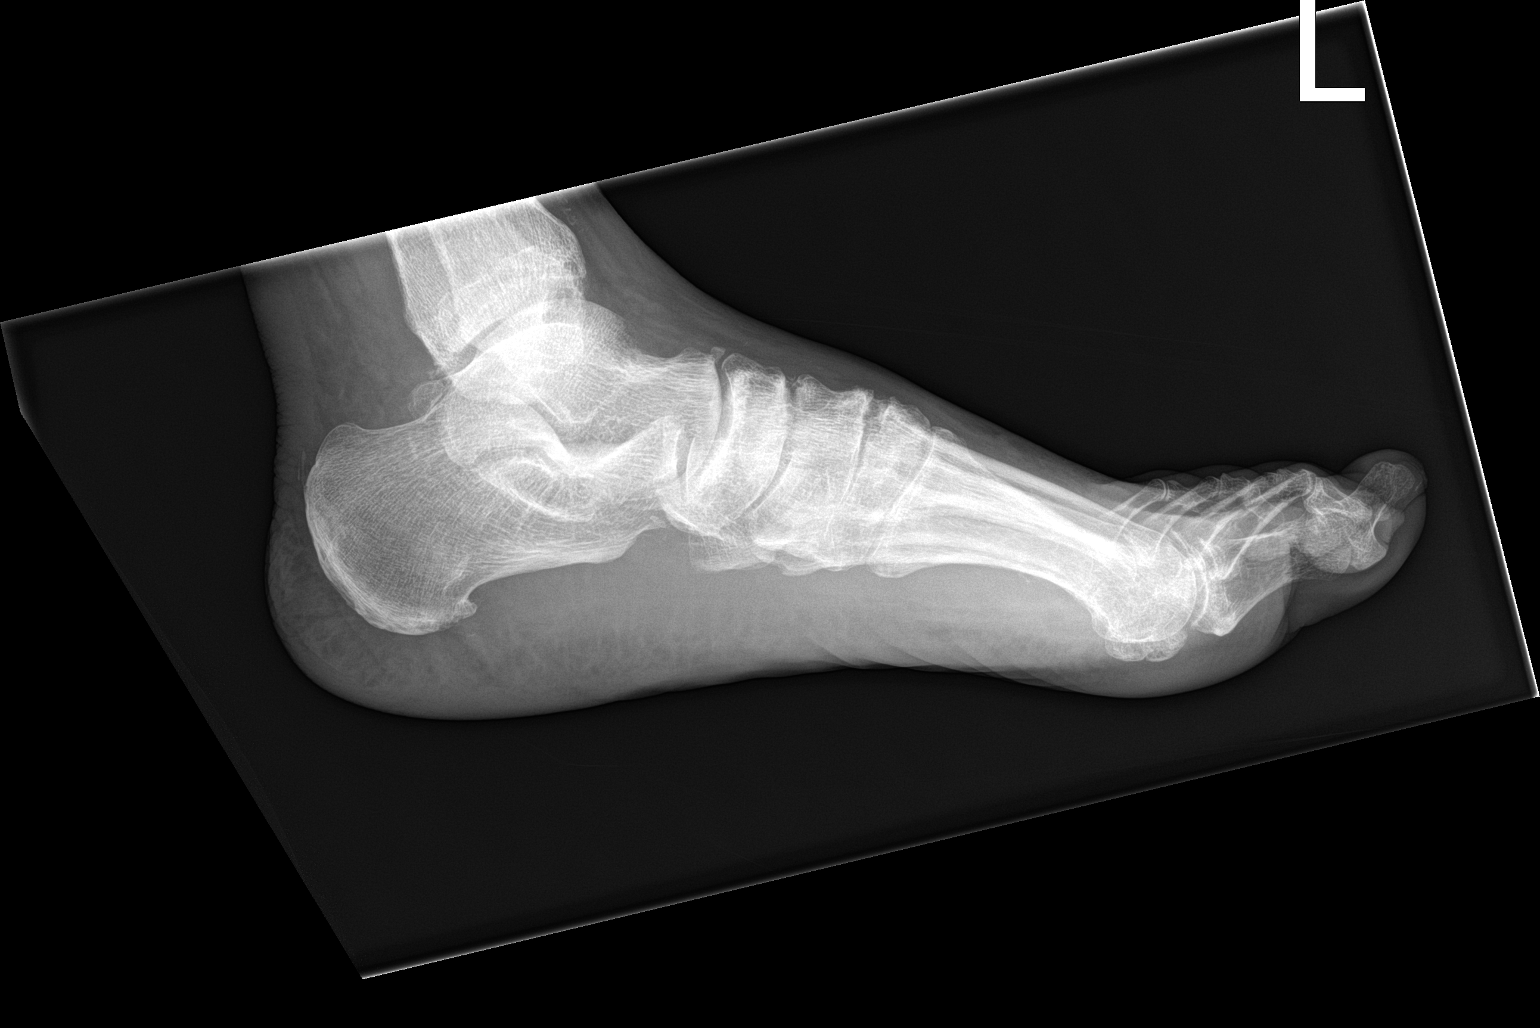

[3 of 3 positions shown; findings below may reference images not displayed]

FINDINGS: Hallux valgus deformity. Small plantar calcaneal spur. No acute bony
abnormality. Specifically, no fracture, subluxation, or dislocation.
Soft tissues are intact.
IMPRESSION: No acute bony abnormality.  Hallux valgus.
